# Patient Record
Sex: Male | Born: 1950 | Race: White | Hispanic: No | Marital: Married | Smoking: Former smoker
Health system: Southern US, Community
[De-identification: ages and names within clinical notes are randomized; demographics above are authoritative.]

## PROBLEM LIST (undated history)

## (undated) DIAGNOSIS — I2510 Atherosclerotic heart disease of native coronary artery without angina pectoris: Secondary | ICD-10-CM

## (undated) DIAGNOSIS — I10 Essential (primary) hypertension: Secondary | ICD-10-CM

## (undated) DIAGNOSIS — C329 Malignant neoplasm of larynx, unspecified: Secondary | ICD-10-CM

## (undated) DIAGNOSIS — R972 Elevated prostate specific antigen [PSA]: Secondary | ICD-10-CM

## (undated) DIAGNOSIS — F17201 Nicotine dependence, unspecified, in remission: Secondary | ICD-10-CM

## (undated) DIAGNOSIS — R05 Cough: Secondary | ICD-10-CM

## (undated) DIAGNOSIS — I679 Cerebrovascular disease, unspecified: Secondary | ICD-10-CM

## (undated) DIAGNOSIS — I724 Aneurysm of artery of lower extremity: Secondary | ICD-10-CM

## (undated) DIAGNOSIS — I82409 Acute embolism and thrombosis of unspecified deep veins of unspecified lower extremity: Secondary | ICD-10-CM

## (undated) DIAGNOSIS — I509 Heart failure, unspecified: Secondary | ICD-10-CM

## (undated) DIAGNOSIS — K219 Gastro-esophageal reflux disease without esophagitis: Secondary | ICD-10-CM

## (undated) DIAGNOSIS — I219 Acute myocardial infarction, unspecified: Secondary | ICD-10-CM

## (undated) DIAGNOSIS — K5792 Diverticulitis of intestine, part unspecified, without perforation or abscess without bleeding: Secondary | ICD-10-CM

## (undated) DIAGNOSIS — E119 Type 2 diabetes mellitus without complications: Secondary | ICD-10-CM

## (undated) DIAGNOSIS — I714 Abdominal aortic aneurysm, without rupture: Secondary | ICD-10-CM

## (undated) DIAGNOSIS — G4730 Sleep apnea, unspecified: Secondary | ICD-10-CM

## (undated) DIAGNOSIS — R053 Chronic cough: Secondary | ICD-10-CM

## (undated) DIAGNOSIS — E669 Obesity, unspecified: Secondary | ICD-10-CM

## (undated) HISTORY — DX: Essential (primary) hypertension: I10

## (undated) HISTORY — DX: Abdominal aortic aneurysm, without rupture: I71.4

## (undated) HISTORY — DX: Atherosclerotic heart disease of native coronary artery without angina pectoris: I25.10

## (undated) HISTORY — DX: Heart failure, unspecified: I50.9

## (undated) HISTORY — PX: CATARACT EXTRACTION: SUR2

## (undated) HISTORY — DX: Obesity, unspecified: E66.9

## (undated) HISTORY — DX: Aneurysm of artery of lower extremity: I72.4

## (undated) HISTORY — PX: EYE SURGERY: SHX253

## (undated) HISTORY — DX: Acute embolism and thrombosis of unspecified deep veins of unspecified lower extremity: I82.409

## (undated) HISTORY — DX: Nicotine dependence, unspecified, in remission: F17.201

## (undated) HISTORY — DX: Elevated prostate specific antigen (PSA): R97.20

## (undated) HISTORY — DX: Gastro-esophageal reflux disease without esophagitis: K21.9

## (undated) HISTORY — DX: Diverticulitis of intestine, part unspecified, without perforation or abscess without bleeding: K57.92

## (undated) HISTORY — DX: Cerebrovascular disease, unspecified: I67.9

## (undated) HISTORY — DX: Sleep apnea, unspecified: G47.30

## (undated) HISTORY — DX: Malignant neoplasm of larynx, unspecified: C32.9

---

## 1997-08-28 ENCOUNTER — Emergency Department (HOSPITAL_COMMUNITY): Admission: EM | Admit: 1997-08-28 | Discharge: 1997-08-28 | Payer: Self-pay | Admitting: Emergency Medicine

## 1997-11-26 ENCOUNTER — Inpatient Hospital Stay (HOSPITAL_COMMUNITY): Admission: EM | Admit: 1997-11-26 | Discharge: 1997-12-01 | Payer: Self-pay | Admitting: Cardiology

## 1998-04-28 HISTORY — PX: CARDIAC CATHETERIZATION: SHX172

## 1998-07-25 ENCOUNTER — Inpatient Hospital Stay (HOSPITAL_COMMUNITY): Admission: EM | Admit: 1998-07-25 | Discharge: 1998-07-26 | Payer: Self-pay | Admitting: Cardiology

## 2000-12-29 ENCOUNTER — Encounter: Payer: Self-pay | Admitting: Internal Medicine

## 2000-12-29 ENCOUNTER — Ambulatory Visit (HOSPITAL_COMMUNITY): Admission: RE | Admit: 2000-12-29 | Discharge: 2000-12-29 | Payer: Self-pay | Admitting: Internal Medicine

## 2001-06-01 ENCOUNTER — Ambulatory Visit: Admission: RE | Admit: 2001-06-01 | Discharge: 2001-06-01 | Payer: Self-pay | Admitting: Otolaryngology

## 2001-07-15 ENCOUNTER — Other Ambulatory Visit: Admission: RE | Admit: 2001-07-15 | Discharge: 2001-07-15 | Payer: Self-pay | Admitting: General Surgery

## 2001-09-29 ENCOUNTER — Ambulatory Visit (HOSPITAL_COMMUNITY): Admission: RE | Admit: 2001-09-29 | Discharge: 2001-09-29 | Payer: Self-pay | Admitting: Pulmonary Disease

## 2001-09-30 ENCOUNTER — Ambulatory Visit (HOSPITAL_COMMUNITY): Admission: RE | Admit: 2001-09-30 | Discharge: 2001-09-30 | Payer: Self-pay | Admitting: Pulmonary Disease

## 2002-01-04 ENCOUNTER — Other Ambulatory Visit: Admission: RE | Admit: 2002-01-04 | Discharge: 2002-01-04 | Payer: Self-pay | Admitting: General Surgery

## 2004-04-28 DIAGNOSIS — K5792 Diverticulitis of intestine, part unspecified, without perforation or abscess without bleeding: Secondary | ICD-10-CM

## 2004-04-28 HISTORY — DX: Diverticulitis of intestine, part unspecified, without perforation or abscess without bleeding: K57.92

## 2004-07-04 ENCOUNTER — Ambulatory Visit (HOSPITAL_COMMUNITY): Admission: RE | Admit: 2004-07-04 | Discharge: 2004-07-04 | Payer: Self-pay | Admitting: Pulmonary Disease

## 2004-11-28 ENCOUNTER — Emergency Department (HOSPITAL_COMMUNITY): Admission: EM | Admit: 2004-11-28 | Discharge: 2004-11-28 | Payer: Self-pay | Admitting: Emergency Medicine

## 2004-12-01 ENCOUNTER — Inpatient Hospital Stay (HOSPITAL_COMMUNITY): Admission: EM | Admit: 2004-12-01 | Discharge: 2004-12-07 | Payer: Self-pay | Admitting: Emergency Medicine

## 2004-12-02 ENCOUNTER — Ambulatory Visit: Payer: Self-pay | Admitting: Cardiology

## 2005-04-28 DIAGNOSIS — I714 Abdominal aortic aneurysm, without rupture, unspecified: Secondary | ICD-10-CM

## 2005-04-28 HISTORY — DX: Abdominal aortic aneurysm, without rupture, unspecified: I71.40

## 2005-04-28 HISTORY — PX: PERIPHERAL ARTERIAL STENT GRAFT: SHX2220

## 2005-04-28 HISTORY — DX: Abdominal aortic aneurysm, without rupture: I71.4

## 2006-01-21 ENCOUNTER — Ambulatory Visit (HOSPITAL_COMMUNITY): Admission: RE | Admit: 2006-01-21 | Discharge: 2006-01-21 | Payer: Self-pay | Admitting: Pulmonary Disease

## 2006-02-16 ENCOUNTER — Ambulatory Visit: Payer: Self-pay | Admitting: Cardiology

## 2006-02-27 ENCOUNTER — Encounter: Admission: RE | Admit: 2006-02-27 | Discharge: 2006-02-27 | Payer: Self-pay | Admitting: Vascular Surgery

## 2006-03-25 ENCOUNTER — Ambulatory Visit: Admission: RE | Admit: 2006-03-25 | Discharge: 2006-03-25 | Payer: Self-pay | Admitting: Vascular Surgery

## 2006-04-16 ENCOUNTER — Inpatient Hospital Stay (HOSPITAL_COMMUNITY): Admission: RE | Admit: 2006-04-16 | Discharge: 2006-04-19 | Payer: Self-pay | Admitting: Vascular Surgery

## 2006-04-16 ENCOUNTER — Encounter: Payer: Self-pay | Admitting: Vascular Surgery

## 2006-04-30 ENCOUNTER — Ambulatory Visit: Payer: Self-pay | Admitting: Cardiology

## 2006-08-27 ENCOUNTER — Ambulatory Visit: Payer: Self-pay | Admitting: Cardiology

## 2006-09-10 ENCOUNTER — Ambulatory Visit: Payer: Self-pay | Admitting: Cardiology

## 2006-09-11 ENCOUNTER — Ambulatory Visit: Payer: Self-pay | Admitting: Vascular Surgery

## 2006-09-11 ENCOUNTER — Encounter: Admission: RE | Admit: 2006-09-11 | Discharge: 2006-09-11 | Payer: Self-pay | Admitting: Vascular Surgery

## 2007-02-03 ENCOUNTER — Encounter: Admission: RE | Admit: 2007-02-03 | Discharge: 2007-02-03 | Payer: Self-pay | Admitting: Vascular Surgery

## 2007-02-03 ENCOUNTER — Ambulatory Visit: Payer: Self-pay | Admitting: Vascular Surgery

## 2007-04-29 HISTORY — PX: COLONOSCOPY WITH ESOPHAGOGASTRODUODENOSCOPY (EGD): SHX5779

## 2007-05-19 ENCOUNTER — Ambulatory Visit: Payer: Self-pay | Admitting: Vascular Surgery

## 2007-05-24 ENCOUNTER — Encounter: Admission: RE | Admit: 2007-05-24 | Discharge: 2007-05-24 | Payer: Self-pay | Admitting: Vascular Surgery

## 2007-06-08 ENCOUNTER — Ambulatory Visit (HOSPITAL_COMMUNITY): Admission: RE | Admit: 2007-06-08 | Discharge: 2007-06-08 | Payer: Self-pay | Admitting: Cardiology

## 2007-06-08 ENCOUNTER — Ambulatory Visit: Payer: Self-pay | Admitting: Cardiology

## 2007-07-07 ENCOUNTER — Ambulatory Visit: Payer: Self-pay | Admitting: Cardiology

## 2007-10-11 ENCOUNTER — Encounter (INDEPENDENT_AMBULATORY_CARE_PROVIDER_SITE_OTHER): Payer: Self-pay | Admitting: Pulmonary Disease

## 2007-10-11 ENCOUNTER — Ambulatory Visit (HOSPITAL_COMMUNITY): Admission: RE | Admit: 2007-10-11 | Discharge: 2007-10-11 | Payer: Self-pay | Admitting: Pulmonary Disease

## 2007-10-11 ENCOUNTER — Ambulatory Visit: Payer: Self-pay | Admitting: Cardiology

## 2007-10-18 ENCOUNTER — Ambulatory Visit (HOSPITAL_COMMUNITY): Admission: RE | Admit: 2007-10-18 | Discharge: 2007-10-18 | Payer: Self-pay | Admitting: Pulmonary Disease

## 2007-10-26 ENCOUNTER — Ambulatory Visit: Payer: Self-pay | Admitting: Gastroenterology

## 2007-10-27 ENCOUNTER — Ambulatory Visit: Payer: Self-pay | Admitting: Vascular Surgery

## 2007-10-27 HISTORY — PX: COLONOSCOPY: SHX174

## 2007-11-22 ENCOUNTER — Ambulatory Visit: Payer: Self-pay | Admitting: Internal Medicine

## 2007-11-22 ENCOUNTER — Ambulatory Visit (HOSPITAL_COMMUNITY): Admission: RE | Admit: 2007-11-22 | Discharge: 2007-11-22 | Payer: Self-pay | Admitting: Internal Medicine

## 2008-03-10 ENCOUNTER — Ambulatory Visit (HOSPITAL_COMMUNITY): Admission: RE | Admit: 2008-03-10 | Discharge: 2008-03-10 | Payer: Self-pay | Admitting: Surgery

## 2008-03-14 ENCOUNTER — Ambulatory Visit: Payer: Self-pay | Admitting: Cardiology

## 2008-04-28 DIAGNOSIS — C329 Malignant neoplasm of larynx, unspecified: Secondary | ICD-10-CM

## 2008-04-28 HISTORY — DX: Malignant neoplasm of larynx, unspecified: C32.9

## 2008-04-28 HISTORY — PX: LARYNX SURGERY: SHX692

## 2008-04-28 HISTORY — PX: LAPAROSCOPIC GASTRIC BANDING: SHX1100

## 2008-05-18 ENCOUNTER — Encounter (INDEPENDENT_AMBULATORY_CARE_PROVIDER_SITE_OTHER): Payer: Self-pay | Admitting: *Deleted

## 2008-05-18 LAB — CONVERTED CEMR LAB: HDL: 44 mg/dL

## 2008-05-24 ENCOUNTER — Encounter: Admission: RE | Admit: 2008-05-24 | Discharge: 2008-05-24 | Payer: Self-pay | Admitting: Vascular Surgery

## 2008-05-24 ENCOUNTER — Ambulatory Visit: Payer: Self-pay | Admitting: Vascular Surgery

## 2008-06-07 ENCOUNTER — Ambulatory Visit (HOSPITAL_COMMUNITY): Admission: RE | Admit: 2008-06-07 | Discharge: 2008-06-07 | Payer: Self-pay | Admitting: Surgery

## 2008-06-19 ENCOUNTER — Ambulatory Visit (HOSPITAL_COMMUNITY): Admission: RE | Admit: 2008-06-19 | Discharge: 2008-06-19 | Payer: Self-pay | Admitting: Surgery

## 2008-08-02 ENCOUNTER — Encounter: Admission: RE | Admit: 2008-08-02 | Discharge: 2008-08-02 | Payer: Self-pay | Admitting: Surgery

## 2008-09-06 ENCOUNTER — Encounter (INDEPENDENT_AMBULATORY_CARE_PROVIDER_SITE_OTHER): Payer: Self-pay | Admitting: *Deleted

## 2008-09-15 ENCOUNTER — Encounter (INDEPENDENT_AMBULATORY_CARE_PROVIDER_SITE_OTHER): Payer: Self-pay | Admitting: *Deleted

## 2008-09-21 ENCOUNTER — Ambulatory Visit: Payer: Self-pay | Admitting: Cardiology

## 2008-09-21 ENCOUNTER — Encounter: Payer: Self-pay | Admitting: Cardiology

## 2008-11-30 ENCOUNTER — Encounter: Admission: RE | Admit: 2008-11-30 | Discharge: 2009-02-28 | Payer: Self-pay | Admitting: Surgery

## 2008-12-13 ENCOUNTER — Other Ambulatory Visit: Payer: Self-pay | Admitting: Surgery

## 2008-12-13 ENCOUNTER — Encounter: Payer: Self-pay | Admitting: Cardiology

## 2008-12-15 ENCOUNTER — Ambulatory Visit: Payer: Self-pay | Admitting: Cardiology

## 2008-12-19 ENCOUNTER — Inpatient Hospital Stay (HOSPITAL_COMMUNITY): Admission: RE | Admit: 2008-12-19 | Discharge: 2008-12-20 | Payer: Self-pay | Admitting: Surgery

## 2009-01-16 ENCOUNTER — Encounter: Payer: Self-pay | Admitting: Cardiology

## 2009-03-02 ENCOUNTER — Encounter: Payer: Self-pay | Admitting: Cardiology

## 2009-04-05 ENCOUNTER — Telehealth (INDEPENDENT_AMBULATORY_CARE_PROVIDER_SITE_OTHER): Payer: Self-pay | Admitting: *Deleted

## 2009-04-28 HISTORY — PX: OPEN ANTERIOR SHOULDER RECONSTRUCTION: SHX2100

## 2009-05-11 ENCOUNTER — Ambulatory Visit: Admission: RE | Admit: 2009-05-11 | Discharge: 2009-07-13 | Payer: Self-pay | Admitting: Radiation Oncology

## 2009-05-30 ENCOUNTER — Encounter: Admission: RE | Admit: 2009-05-30 | Discharge: 2009-05-30 | Payer: Self-pay | Admitting: Surgery

## 2009-09-11 ENCOUNTER — Encounter (INDEPENDENT_AMBULATORY_CARE_PROVIDER_SITE_OTHER): Payer: Self-pay | Admitting: *Deleted

## 2009-10-11 ENCOUNTER — Encounter: Payer: Self-pay | Admitting: Cardiology

## 2009-11-20 ENCOUNTER — Encounter (INDEPENDENT_AMBULATORY_CARE_PROVIDER_SITE_OTHER): Payer: Self-pay | Admitting: *Deleted

## 2009-11-21 ENCOUNTER — Encounter (INDEPENDENT_AMBULATORY_CARE_PROVIDER_SITE_OTHER): Payer: Self-pay | Admitting: *Deleted

## 2009-11-21 ENCOUNTER — Ambulatory Visit: Payer: Self-pay | Admitting: Cardiology

## 2009-11-21 DIAGNOSIS — I10 Essential (primary) hypertension: Secondary | ICD-10-CM | POA: Insufficient documentation

## 2010-03-11 ENCOUNTER — Encounter (INDEPENDENT_AMBULATORY_CARE_PROVIDER_SITE_OTHER): Payer: Self-pay | Admitting: *Deleted

## 2010-03-19 ENCOUNTER — Ambulatory Visit: Payer: Self-pay | Admitting: Cardiology

## 2010-03-19 ENCOUNTER — Encounter (INDEPENDENT_AMBULATORY_CARE_PROVIDER_SITE_OTHER): Payer: Self-pay | Admitting: *Deleted

## 2010-03-22 ENCOUNTER — Encounter (INDEPENDENT_AMBULATORY_CARE_PROVIDER_SITE_OTHER): Payer: Self-pay | Admitting: *Deleted

## 2010-03-22 ENCOUNTER — Encounter: Payer: Self-pay | Admitting: Cardiology

## 2010-03-22 LAB — CONVERTED CEMR LAB
AST: 23 units/L (ref 0–37)
BUN: 14 mg/dL (ref 6–23)
Basophils Relative: 1 % (ref 0–1)
Calcium: 9.2 mg/dL (ref 8.4–10.5)
Chloride: 101 meq/L (ref 96–112)
Cholesterol: 187 mg/dL (ref 0–200)
Creatinine, Ser: 1.02 mg/dL (ref 0.40–1.50)
Eosinophils Relative: 3 % (ref 0–5)
HCT: 51.5 % (ref 39.0–52.0)
HDL: 35 mg/dL — ABNORMAL LOW (ref >39)
Hemoglobin: 17.2 g/dL — ABNORMAL HIGH (ref 13.0–17.0)
MCHC: 33.4 g/dL (ref 30.0–36.0)
MCV: 95 fL (ref 78.0–100.0)
Monocytes Absolute: 0.7 10*3/uL (ref 0.1–1.0)
Monocytes Relative: 11 % (ref 3–12)
Neutro Abs: 3.6 10*3/uL (ref 1.7–7.7)
RBC: 5.42 M/uL (ref 4.22–5.81)
Total Bilirubin: 0.4 mg/dL (ref 0.3–1.2)
Total CHOL/HDL Ratio: 5.3

## 2010-03-25 ENCOUNTER — Encounter: Payer: Self-pay | Admitting: Cardiology

## 2010-04-01 ENCOUNTER — Telehealth (INDEPENDENT_AMBULATORY_CARE_PROVIDER_SITE_OTHER): Payer: Self-pay

## 2010-04-03 ENCOUNTER — Telehealth (INDEPENDENT_AMBULATORY_CARE_PROVIDER_SITE_OTHER): Payer: Self-pay

## 2010-04-03 ENCOUNTER — Encounter: Payer: Self-pay | Admitting: Adult Health

## 2010-04-23 ENCOUNTER — Encounter: Payer: Self-pay | Admitting: Cardiology

## 2010-05-19 ENCOUNTER — Encounter: Payer: Self-pay | Admitting: Surgery

## 2010-05-19 ENCOUNTER — Encounter: Payer: Self-pay | Admitting: Vascular Surgery

## 2010-05-22 ENCOUNTER — Telehealth (INDEPENDENT_AMBULATORY_CARE_PROVIDER_SITE_OTHER): Payer: Self-pay | Admitting: *Deleted

## 2010-05-28 NOTE — Letter (Signed)
Summary: [place] [facility], [facility] at [employer]  [address] [place] [address]   Phone: [phone]  Fax: [phone]     March 19, 2010 MRN: [id number]   Mathew Padilla [address] [place], [place]  [address]      YOUR LAB WORK IS DUE   March 19, 2010  Please go to [facility], located across the street from [facility] on the second floor.  Hours are Monday - Friday 7am until 7:30pm         Saturday 8am until 12noon    __  DO NOT EAT OR DRINK AFTER MIDNIGHT EVENING PRIOR TO LABWORK

## 2010-05-28 NOTE — Miscellaneous (Signed)
Summary: LABS LIPIDS,05/18/2008  Clinical Lists Changes  Observations: Added new observation of LDL: 70 mg/dL (38/75/6433 29:51) Added new observation of HDL: 44 mg/dL (88/41/6606 30:16) Added new observation of TRIGLYC TOT: 234 mg/dL (05/06/3233 57:32) Added new observation of CHOLESTEROL: 161 mg/dL (20/25/4270 62:37)

## 2010-05-28 NOTE — Letter (Signed)
Summary: [place] [facility], [facility] at [employer]  [address], [place] [address]   Phone: [phone]  Fax: [phone]     April 03, 2010 MRN: [id number]   Mathew Padilla [address] [place], [place]  [address]      YOUR LAB WORK IS DUE  April 22, 2010 _________________________________________  Please go to [facility], located across the street from [facility] on the second floor.  Hours are Monday - Friday 7am until 7:30pm         Saturday 8am until 12noon    __  DO NOT EAT OR DRINK AFTER MIDNIGHT EVENING PRIOR TO LABWORK  _X_ YOUR LABWORK IS NOT FASTING --YOU MAY EAT PRIOR TO LABWORK

## 2010-05-28 NOTE — Miscellaneous (Signed)
  Clinical Lists Changes  Orders: Added new Referral order of Treadmill (Treadmill) - Signed  I spoke with pt, GXT scheduled for 03/19/10 at 11:30am per TG.   Teressa Lower RN  March 12, 2010 11:51 AM pt asked to hold his diovan per protocol

## 2010-05-28 NOTE — Letter (Signed)
Summary: [facility]   Imported By: Faythe Ghee 03/22/2010 13:25:16  _____________________________________________________________________  External Attachment:    Type:   Image     Comment:   External Document

## 2010-05-28 NOTE — Letter (Signed)
Summary: Appointment - Reminder 2  [place] [facility] [place]. [address], [place] [address]   Phone: [phone]  Fax: [phone]     Sep 11, 2009 MRN: [id number]   Mathew Padilla [address] [place], [place]  [address]   Dear Mr. Gilpatrick,  Our records indicate that it is time to schedule a follow-up appointment.  Dr.  Dietrich Pates        recommended that you follow up with [place] in     05/2009       . It is very important that we reach you to schedule this appointment. We look forward to participating in your health care needs. Please contact us at the number listed above at your earliest convenience to schedule your appointment.  If you are unable to make an appointment at this time, give [place] a call so we can update our records.     Sincerely,   Glass blower/designer

## 2010-05-28 NOTE — Progress Notes (Signed)
Summary: labwork added  Phone Note Call from Patient   Caller: Patient Reason for Call: Talk to Nurse Summary of Call: patient wants to know if he can have HGA1C can be added to his labwork for 12/26 / tg Initial call taken by: Raechel Ache [facility],  April 03, 2010 10:29 AM  Follow-up for Phone Call        Lab orders mailed to pt. Follow-up by: Larita Fife Via LPN,  April 03, 2010 2:55 PM

## 2010-05-28 NOTE — Letter (Signed)
Summary: [place] [facility], agricultural at [employer]  [address] [place] [address]   Phone: [phone]  Fax: [phone]     November 21, 2009 MRN: [id number]   Mathew Padilla [address] [place], [place]  [address]      YOUR LAB WORK IS DUE   MONDAY   November 26, 2009  Please go to [facility], located across the street from [facility] on the second floor.  Hours are Monday - Friday 7am until 7:30pm         Saturday 8am until 12noon    _X_  DO NOT EAT OR DRINK AFTER MIDNIGHT EVENING PRIOR TO LABWORK  __ YOUR LABWORK IS NOT FASTING --YOU MAY EAT PRIOR TO LABWORK
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
## 2010-05-28 NOTE — Letter (Signed)
Summary: [place] [facility], [facility] at [employer]  [address] [place] [address]   Phone: [phone]  Fax: [phone]     March 22, 2010 MRN: [id number]   Mathew Padilla [address] [place], [place]  [address]      YOUR LAB WORK IS DUE   April 22, 2010  Please go to [facility], located across the street from [facility] on the second floor.  Hours are Monday - Friday 7am until 7:30pm         Saturday 8am until 12noon    _x_  DO NOT EAT OR DRINK AFTER MIDNIGHT EVENING PRIOR TO LABWORK

## 2010-05-28 NOTE — Progress Notes (Signed)
Summary: PT HAVING TOOTH PULLED  Phone Note Call from Patient Call back at Home Phone [phone]   Caller: PT Reason for Call: Talk to Nurse Summary of Call: PT IS HAVING A TOOTH PULLED AND WOULD LIKE TO KNOW IF HE NEEDS TO BE ON ANY MEDICATIONS PRIOR TO HAVING THIS DONE Initial call taken by: Faythe Ghee,  April 01, 2010 3:14 PM  Follow-up for Phone Call        [facility]. Follow-up by: Larita Fife Via LPN,  April 02, 2010 1:29 PM  Additional Follow-up for Phone Call Additional follow up Details #1::        Pt's wife states that in the past pt. has been treated with PCN before any dental procedures were done due to pt's heart and kidney stents. Pt. is scheduled for teeth extraction on 12-28 and they want to know if he will need to be medicated this time. Please advise.  Additional Follow-up by: Larita Fife Via LPN,  April 02, 2010 4:06 PM    No need to hold any medications for tooth extraction.  Joni Reining NP  Does pt. need PCN before teeth extractions?       Larita Fife Via LPN  April 03, 2010 1:32 PM   No, he doesn't have stents or mechanical valve.  Joni Reining NP  Pt's wife advised.        Larita Fife Via LPN  April 04, 2010 12:14 PM

## 2010-05-28 NOTE — Letter (Signed)
Summary: progress note 09-28-09  progress note 09-28-09   Imported By: Faythe Ghee 10/11/2009 12:27:43  _____________________________________________________________________  External Attachment:    Type:   Image     Comment:   External Document

## 2010-05-30 NOTE — Progress Notes (Signed)
Summary: lab results  Phone Note Call from Patient Call back at Home Phone [phone]   Caller: pt Reason for Call: Talk to Nurse, Lab or Test Results Summary of Call: test results Initial call taken by: Faythe Ghee,  May 22, 2010 12:56 PM  Follow-up for Phone Call        results given to pt, verbalized understanding Follow-up by: Teressa Lower RN,  May 22, 2010 1:18 PM

## 2010-07-09 ENCOUNTER — Other Ambulatory Visit: Payer: Self-pay | Admitting: Vascular Surgery

## 2010-07-09 DIAGNOSIS — I714 Abdominal aortic aneurysm, without rupture: Secondary | ICD-10-CM

## 2010-07-25 ENCOUNTER — Ambulatory Visit (INDEPENDENT_AMBULATORY_CARE_PROVIDER_SITE_OTHER): Payer: Medicare Other | Admitting: Vascular Surgery

## 2010-07-25 ENCOUNTER — Ambulatory Visit
Admission: RE | Admit: 2010-07-25 | Discharge: 2010-07-25 | Disposition: A | Discharge status: Home / Self Care | Payer: Medicare Other | Source: Ambulatory Visit | Attending: Vascular Surgery | Admitting: Vascular Surgery

## 2010-07-25 DIAGNOSIS — I714 Abdominal aortic aneurysm, without rupture: Secondary | ICD-10-CM

## 2010-07-25 MED ORDER — IOHEXOL 350 MG/ML SOLN
150.0000 mL | Freq: Once | INTRAVENOUS | Status: AC | PRN
  Administered 2010-07-25: 150 mL via INTRAVENOUS

## 2010-07-26 NOTE — Assessment & Plan Note (Signed)
OFFICE VISIT  Mathew, Padilla DOB:  [birth year]                                       07/25/2010 CHART#:[id number]  The patient returns for followup today.  He previously had a Zenith aneurysm stent graft placed in December of 2007.  He has had no problems since then.  He returns today for further followup.  He denies any abdominal or back pain.  He has been able to lose some weight with a laparotomy band procedure and [facility]  CHRONIC MEDICAL PROBLEMS:  Remain obesity, reflux disease and hypertension.  These are currently controlled and followed by Dr. Juanetta Gosling.  Of note, he was treated for throat cancer and a polyp on his vocal cord in 2011 and has recovered from this.  PHYSICAL EXAMINATION:  Blood pressure 121/85 in the left arm, heart rate 96 and regular, respirations 20.  Neck:  Has no carotid bruits. Abdomen:  Obese, soft, nontender, nondistended.  No pulsatile mass. Femoral pulses are difficult to palpate secondary to obesity.  Feet are pink, warm and well-perfused with no palpable pedal pulses.  He does have a few scattered varicosities.  Skin:  Intact.  No ulcers or rashes.  He had a CT scan of the abdomen and pelvis today which shows the stent graft is in good position with the top portion of the stent just below the celiac origin.  There is no evidence of endo leak.  The native aortic diameter is 3.7 cm in diameter down from 5.3 cm preoperatively.  In summary, the patient is doing well after endovascular stent grafting. He will follow up in 1 year's time for an abdominal aortic ultrasound for further followup of his aneurysm and then yearly thereafter.    Janetta Hora. Analysse Quinonez, MD Electronically Signed  CEF/MEDQ  D:  07/25/2010  T:  07/26/2010  Job:  [id number]  cc:   Ramon Dredge L. Juanetta Gosling, M.D.

## 2010-08-03 LAB — DIFFERENTIAL
Basophils Absolute: 0 10*3/uL (ref 0.0–0.1)
Basophils Absolute: 0 10*3/uL (ref 0.0–0.1)
Eosinophils Relative: 1 % (ref 0–5)
Lymphocytes Relative: 17 % (ref 12–46)
Lymphocytes Relative: 37 % (ref 12–46)
Monocytes Absolute: 0.7 10*3/uL (ref 0.1–1.0)
Monocytes Absolute: 0.9 10*3/uL (ref 0.1–1.0)
Monocytes Relative: 11 % (ref 3–12)
Neutro Abs: 4 10*3/uL (ref 1.7–7.7)

## 2010-08-03 LAB — CBC
HCT: 46.7 % (ref 39.0–52.0)
HCT: 48 % (ref 39.0–52.0)
Hemoglobin: 15.9 g/dL (ref 13.0–17.0)
MCHC: 33.9 g/dL (ref 30.0–36.0)
MCV: 94.5 fL (ref 78.0–100.0)
Platelets: 186 10*3/uL (ref 150–400)
RDW: 13.4 % (ref 11.5–15.5)
RDW: 13.6 % (ref 11.5–15.5)

## 2010-08-03 LAB — COMPREHENSIVE METABOLIC PANEL
Albumin: 4.1 g/dL (ref 3.5–5.2)
BUN: 23 mg/dL (ref 6–23)
Calcium: 9.5 mg/dL (ref 8.4–10.5)
Creatinine, Ser: 1.08 mg/dL (ref 0.4–1.5)
Total Protein: 7.7 g/dL (ref 6.0–8.3)

## 2010-09-10 NOTE — Op Note (Signed)
NAME:  KANEN, MOTTOLA NO.:  [id number]   MEDICAL RECORD NO.:  [id number]          PATIENT TYPE:  INP   LOCATION:  1235                         FACILITY[facility]  [facility]   PHYSICIAN:  Sandria Bales. Ezzard Standing, M.D.  DATE OF BIRTH:  1950/05/17   DATE OF PROCEDURE:  12/19/2008  DATE OF DISCHARGE:                               OPERATIVE REPORT   Date of Surgery ?   PREOPERATIVE DIAGNOSIS:  Morbid obesity (weight 246, BMI 51.3), hiatal  hernia.   POSTOPERATIVE DIAGNOSIS:  Morbid obesity (weight 246, BMI 51.3), hiatal  hernia, thickened left vocal cord.   PROCEDURE:  Laparoscopic band placement with APL band (subcutaneous  port), hiatal hernia repair with 2 + 2 sutures.   SURGEON:  Sandria Bales. Ezzard Standing, M.D.   FIRST ASSISTANT:  Sharlet Salina T. Hoxworth, M.D.   ANESTHESIA:  General endotracheal anesthesia   ESTIMATED BLOOD LOSS:  Minimal.   INDICATIONS FOR PROCEDURE:  Mr. Salek is a 60 year-old white male who is  morbidly obese and has been through our preoperative bariatric program  and now comes for attempted laparoscopic band placement.  He is a  patient of Dr. Juanetta Gosling, who is his primary care physician. He has seen  Dr. Dietrich Pates from a cardiac standpoint and Dr. Sandrea Hughs from a  pulmonary standpoint.  He is on CPAP for sleep apnea.   On his preoperative evaluation, he was found to have a small hiatal  hernia and I discussed with him at the time of surgery a possible repair  of the hiatel hernia..   The potential complications of lap band placement and hiatal hernia  repair include, but not limited to, bleeding, infection, bowel injury,  slippage or erosion of the band and long term nutritional complications.   OPERATIVE NOTE:  The patient placed in supine position and given a  general endotracheal anesthetic.  This was supervised by Dr. Rica Mast who  reviewed the findings at the time of laryngoscopy and wondered whether  he had abnormal cords.  The patient has had some  hoarseness which has  been attributed to the lisinopril he is taking.  I discussed this with  the family about him maybe seeing an ENT doctor when he has gotten over  the operation just for further evaluation.   A time out was had identifying the patient and the procedure and the  surgeon's checklist run.  His abdomen which had been prepped with THC  and was sterilely draped.  I gained access to the abdominal cavity  through the left upper quadrant and got into the abdominal cavity with  an 11 mm Ethicon trocar.  I placed 5 additional trocars; a 5 mm  subxiphoid trocar, a 15 mm right subcostal trocar, a 12 mm right  paramedian trocar, a 12 mm left paramedian trocar and a 5 mm left  lateral trocar.   Interestingly, his right and left lobe of the liver are showing some  fatty infiltration.  We did see some spots of fat on his small bowel but  he had at least two areas of fairly dense anterior abdominal wall  adhesions.  One was between his umbilicus and his falciform ligament in  his mid upper abdomen.  These were sort of filmy adhesions. I took about  half these down using my trocars in place.  I could not see any obvious  hernia and did not know if a hernia was part of this or not.  I did not  see one.  There were also some adhesions in his left upper quadrant  lateral to the stomach.  He has had no prior abdominal surgery.  The  cause of these adhesions was somewhat unclear and there was nothing  associated to biopsy with the adhesions.   I then turned my attention to his upper abdomen.  I placed in a  Nathanson  liver retractor and retracted the left lobe of the liver  anteriorly and he had a lot of fat around his hiatus.  I dissected out.  First I went along the left side of the esophagus and dissected out the  angle of His.  I then went along the right side of the esophagus and got  behind the gastrohepatic ligament and identified the right crus.  We did  the balloon test where  Anesthesia passed the sizing balloon, put 50 mL  of air and then pulled this up but clearly he had had a patchy hiatus  that this could be passed and pulled back without difficulty.  I then  dissected out along the right crus in the retroesophageal area and  around anteriorly and identified what was a fairly generous esophageal  area.  I found the left and right crus.  I put two posterior sutures of  0 Ethibond, closed with tie knots.   Then I passed the sizing balloon and blew up with 15 mL of air; now it  hung with the hiatus so I felt the hiatus had been closed adequately  without making it too tight.   I then passed the finger dissector behind the gastroesophageal junction  and passed an APL band around the proximal  stomach  right beyond the  gastroesophageal junction.  I actually had dissected out enough fat to  make an area where the band could be cinched down and it was cinched  down without difficulty over the sizing tube and then the sizing tube  removed.  I then placed 3 imbricating sutures along the greater  curvature of the stomach over the band to the proximal gastric remnants  using gastric plicating sutures.  There was no bleeding at any suture  site.  All the sutures looked good.  I then removed the tubing from the  band out through the paramedian incision.  I reinspected where the liver  retractor was.  I reinspected where the adhesions were.  I left the  reminder of these alone.  I then desufflated the  abdomen and removed  the trocars with no bleeding at any trocar site.  I did decide to  develop a subcutaneous port for reservoir for him.  I created a 2  fingerbreadth pocket.  I attached the reservoir to the Silastic tubing.  This had a backing of polypropylene and tucked this in laterally.   I then closed the subcutaneous tissues over the reservoir with 2-0  Vicryl sutures.  I infiltrated about 2 mL of 0.25% Marcaine closing the  skin at each site with a 5-0  Monocryl suture, used Dermabond on the  wounds.   The patient tolerated the procedure well.  I did reveal  the hiatal  hernia.  He had a lot of fat around his gastroesophageal junction.  I  defatted some. The port placement looked good and of note, he does have  thickened vocal cords I will speak to his wife about.   Sponge and needle count were correct at the end of the case.  He was  transported to the recovery room in good condition.      Sandria Bales. Ezzard Standing, M.D.  Electronically Signed     DHN/MEDQ  D:  12/19/2008  T:  12/19/2008  Job:  [id number]   cc:   Ramon Dredge L. Juanetta Gosling, M.D.  Fax: [phone]   Gerrit Friends. Dietrich Pates, MD, [facility]  [address]  [place], [place] [address]   Charlaine Dalton. Sherene Sires, MD, FCCP  [address]  [place] [address]   Janetta Hora. Fields, MD  [address], [place] [address]

## 2010-09-10 NOTE — Assessment & Plan Note (Signed)
OFFICE VISIT   JANCE, SIEK  DOB:  April 18, 1951                                       02/03/2007  CHART#:[id number]   The patient returns for followup after placement of his zenith aneurysm  stent graft in December 2007.  He has had no problems with abdominal or  back pain.  Otherwise, he has been well.   PHYSICAL EXAMINATION:  Today, blood pressure is 125/83, heart rate 73  and regular.  Abdomen soft, nontender, nondistended, with 1+ femoral  pulses.  Incisions are well healed.   A CT scan of the abdomen and pelvis was reviewed.  His aneurysm has  shrunk from 5.1 cm down to 3.8 cm.  There is no evidence of endoleak.  Overall, the patient is doing well.  He will have a followup scan in 3  months' time and then we will go to yearly CT scans after that.   Janetta Hora. Fields, MD  Electronically Signed   CEF/MEDQ  D:  02/03/2007  T:  02/04/2007  Job:  [id number]   cc:   Ramon Dredge L. Juanetta Gosling, M.D.

## 2010-09-10 NOTE — Procedures (Signed)
[facility]                              EXERCISE TREADMILL   RAFE, MACKOWSKI                      MRN:          [id number]  DATE:09/10/2006                            DOB:          1950-10-20    PROCEDURE:  Graded exercise test.   1. Upright treadmill exercise performed to a workload of 7 METs and a      heart rate of 131, 79% of patient's age-predicted maximum.      Exercise discontinued due to dyspnea and fatigue; no chest pain      reported.  2. Blood pressure increased from a resting value of 120/70 to 190/80      at peak exercise, a normal response.  3. No arrhythmias noted.  4. 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   Stress EKG:  No significant change; occasional PVC.   IMPRESSION:  Slightly submaximal graded exercise test revealing adequate  exercise tolerance, a normal blood pressure response, no significant  arrhythmias, and no electrocardiographic evidence for myocardial  ischemia.  Other findings as noted.     Gerrit Friends. Dietrich Pates, MD, [facility]     RMR/MedQ  DD: 09/10/2006  DT: 09/10/2006  Job #: [id number]

## 2010-09-10 NOTE — H&P (Signed)
NAME:  ERBY, Mathew Padilla NO.:  [id number]   MEDICAL RECORD NO.:  [id number]          PATIENT TYPE:  AMB   LOCATION:  DAY                           FACILITY:  [facility]   PHYSICIAN:  Kassie Mends, M.D.      DATE OF BIRTH:  08-09-1950   DATE OF ADMISSION:  DATE OF DISCHARGE:  LH                              HISTORY & PHYSICAL   CHIEF COMPLAINT:  Abdominal bloating, change in bowel movements, bad  breath, and weight gain.   HISTORY OF PRESENT ILLNESS:  Mr. Hanawalt is a 60 year old morbidly obese  Caucasian gentleman who presents today as a self-referral for the above-  stated symptoms.  He complains of chronic postprandial upper abdominal  discomfort and bloating.  He states he feels like he is stuffed even  when he just drinks a little water.  He has had no nausea or vomiting.  Denies any heartburn.  Currently on ranitidine 150 mg twice a day.  Denies any dysphagia or odynophagia.  He states his abdomen gets very  tight after he eats his meal.  He has a change in bowel movements with  mostly diarrhea but sometimes constipation.  His stools are dark at  times.  Denies any bright red blood per rectum.  He complains of  fatigue.  He has shortness of breath at times even at rest.  He  complains of sweating a lot.  He says he has been on multiple diet over  the years and always seems to gain weight instead of loose weight.  At  one point, he did go to the steps for gastric bypass but this was put on  hold because of abdominal aortic aneurysm that needed to be repaired.  He has since had a stent/graft placed in his aorta back in December  2007. His wife states he has very bad breath.  He can smell across the  room.  She is concerned if he has an infection.  She says it is similar  to when he was in the hospital with a ruptured diverticulum which was  treated with antibiotic therapy.  Surgery was contemplated but they  state because of his aneurysm that they wanted to try to treat  medically  first and apparently this was successful.   CURRENT MEDICATIONS:  1. Ranitidine 150 mg b.i.d.  2. Colchicine 0.6 mg b.i.d.  3. Lisinopril 40 mg daily.  4. Torsemide 40 mg daily.  5. Metanx daily.  6. CPAP.   ALLERGIES:  CHOLESTEROL MEDICATIONS including ZOCOR and some type of  unknown drug which is part of a study that caused a rash.   PAST MEDICAL HISTORY:  1. Hiatal hernia.  2. Hypertension.  3. Sleep apnea.  4. History of prior MI status post angioplasty back in 1999.  5. He had a stent placed.  6. He reports having a stress test a couple of years ago which was      unremarkable.  7. He has gallstones.  8. History of enlarged prostate.  9. History of diverticulitis with perforation as outlined above.  10.He had an EGD in  2000, which showed multiple small distal      esophageal erosions, bulbar duodenal erosions.  H. pylori      serologies were negative.  11.He has never had a colonoscopy.   FAMILY HISTORY:  Mother had lung disease, died at age 18.  Father died  at age 58 with MI.  He had an aunt with colorectal cancer.   SOCIAL HISTORY:  He is married.  He has 3 children.  He is unemployed.  He is a nonsmoker.  No alcohol use.   REVIEW OF SYSTEMS:  GI:  See HPI.  CONSTITUTIONAL:  He complains of progressive weight loss.  He states he  weighed 360 pounds at Dr. Marvel Plan office a couple of weeks ago.  CARDIOPULMONARY:  Complains of fatigue, shortness of breath, dyspnea on  exertion, or diaphoresis.  Denies chest pain.  Denies cough or  palpitations.  GENITOURINARY: Complains of urinary hesitancy and difficulty with the  stream.  No dysuria or hematuria.   PHYSICAL EXAM:  VITAL SIGNS:  Unable to obtain his weight here.  He  states he weighed 360 pounds recently, 5 feet 11 height, temperature  97.9, blood pressure 110/78 with a large cuff, pulse 76.  GENERAL:  Pleasant, obese, Caucasian male in no acute distress.  He  appears quite comfortable.  SKIN:   Warm and dry.  No jaundice.  HEENT:  Sclerae nonicteric. Oropharyngeal mucosa moist and pink.  No  lesions, erythema or exudate.  No lymphadenopathy or  thyromegaly.  CHEST:  Lungs clear auscultation.  CARDIAC:  Exam reveals regular rate and rhythm.  Normal S1 and S2, no  murmurs, rubs, or gallops.  ABDOMEN: Positive bowel sounds.  Abdomen is very obese, soft, nontender.  Do not appreciate any organomegaly or masses due to body habitus.  No  obvious hernias.  No abdominal bruits.  EXTREMITIES:  Lower Extremities:  1+ pitting edema bilaterally   IMPRESSION:  Mr. Eckels is a morbidly obese Caucasian gentleman who  presents today for further evaluation of early satiety, abdominal  bloating with change in bowel movements with mostly diarrhea, but  sometimes constipation.  He complains of ongoing weight gain and  malodorous breath.  Denies any typical heartburn or abdominal pain.  He  has a history of perforated diverticulitis a couple of years ago.  He  has never had a colonoscopy.  Suggested at this time, we will pursue  colonoscopy for change in bowel movements.  We will also perform upper  endoscopy to further evaluate early satiety, exclude peptic ulcer  disease, etc.  He has numerous GI complaints.  I have asked him to  follow up with Dr. Juanetta Gosling including problems with his urination, most  likely due to his prostatic enlargement which has been seen on multiple  CTs.  He inquires quite a bit about possible methods of weight loss and  I offered a nutritional referral to a dietician at the hospital.  He is  interested in pursuing some sort of evaluation at a weight loss center.   PLAN:  1. Colonoscopy and EGD with Dr. Jena Gauss.  2. Follow up with Dr. Juanetta Gosling regarding prostatic enlargement.  3. Retrieve copy of recent labs done through Dr. Juanetta Gosling office.  4. Arrange for referral to weight loss center after appropriate workup      has been done here.  5. Further recommendations to  follow.      Tana Coast, P.A.      Kassie Mends, M.D.  Electronically Signed  LL/MEDQ  D:  10/26/2007  T:  10/27/2007  Job:  [id number]

## 2010-09-10 NOTE — Op Note (Signed)
NAME:  Mathew Padilla, Mathew Padilla NO.:  [id number]   MEDICAL RECORD NO.:  [id number]          PATIENT TYPE:  AMB   LOCATION:  DAY                           FACILITY:  [facility]   PHYSICIAN:  R. Roetta Sessions, M.D. DATE OF BIRTH:  [birth year]   DATE OF PROCEDURE:  11/22/2007  DATE OF DISCHARGE:                               OPERATIVE REPORT   Diagnostic EGD followed by a screening colonoscopy.   INDICATIONS FOR PROCEDURE:  Morbidly obese 60 year old gentleman with  early satiety, abdominal bloating, halitosis, and progressive weight  gain.  He is having typical reflux symptoms.  He is on ranitidine 150 mg  orally once daily for reflux symptoms.  He also has alternating  constipation and diarrhea, reported history of diverticulitis  previously, he has never had his lower GI tract imaged luminally.  No  family history of colon cancer in any first degree relatives, one aunt  did have the disease.  EGD and colonoscopy are now being done.  This  approach has been discussed with the patient at length.  Potential  risks, benefits, and alternatives have been reviewed.  Questions  answered.  He is agreeable.  Please see documentation in medical record.   PROCEDURE NOTE:  O2 saturation, blood pressure, pulse, and respirations  are monitored throughout the entire procedure.  Conscious sedation,  Versed 6 mg IV and Demerol 100 mg IV in divided doses prior to  procedure.  Cetacaine spray for topical pharyngeal anesthesia.   INSTRUMENTATION:  Pentax video chip system.   FINDINGS:  EGD examination of the tubular esophagus revealed four  quadrant distal esophageal erosions patulous EG junction.  Erosions were  not very big, there were no more than 5 mm in dimensions.  There is no  Barrett esophagus, EG junction easily traversed and into his stomach.  Gastric cavity was emptied and insufflated well with air.  Thorough  examination of the gastric mucosa including retroflexion of the proximal  stomach, esophagogastric junction demonstrated only a small hiatal  hernia and a couple of tiny antral erosions.  Pylorus was patent and  easily traversed.  Examination of the bulb and second portion revealed  couple of bulbar erosions.  There was a 7 mm somewhat yellowish  submucosal lesion on the angularis consistent with a lipoma not  manipulated.   THERAPEUTIC/DIAGNOSTIC MANEUVERS PERFORMED:  None.   The patient tolerated the procedure well.  The patient prepared for  colonoscopy.  Digital rectal exam revealed no abnormalities.  Endoscopic  findings:  Prep was adequate.  Colon:  Colonic mucosa was surveyed from  the rectosigmoid junction through the left transverse, right colon,  appendiceal orifice, ileocecal valve, and cecum.  These structures were  all seen and photographed for the record.  From this level, the scope  was slowly and cautiously withdrawn.  All previously mentioned mucosal  surfaces were again seen.  The patient had scattered left-sided  diverticula, a long tortuous but otherwise normal appearing colon.  The  scope was pulled down the rectum with examination of rectal mucosa,  including retroflexed view of the anal verge demonstrated no  abnormalities.  The patient tolerated the procedure well and was reacted  in endoscopy.   IMPRESSION:  1. Distal esophageal erosions, patulous gastroesophageal junction.      There was erosive reflux esophagitis.  2. Hiatal hernia, lipoma on the angularis not manipulated, otherwise      unremarkable gastric mucosa aside from antral erosions, patent      pylorus, bulbar erosions, otherwise D1 and D2 appeared normal.  3. Colonoscopy findings:  Normal rectum, tortuous colon, left-sided      diverticula.  The remainder of the colon mucosa appeared normal.   I suspect the patient has untreated gastroesophageal reflux disease and  irritable bowel syndrome as the diagnosis of his current GI symptoms.   RECOMMENDATIONS:  1. Stop  ranitidine and begin Aciphex 20 mg orally daily, prescription      given.  2. Begin Alinia, a probiotic 1 capsule daily.  3. Diverticulosis and reflux disease literature provided to [name].  4. Followup appointment with [place] in 6 weeks.      Jonathon Bellows, M.D.  Electronically Signed     RMR/MEDQ  D:  11/22/2007  T:  11/23/2007  Job:  [id number]

## 2010-09-10 NOTE — Assessment & Plan Note (Signed)
OFFICE VISIT   WALLACE, GAPPA  DOB:  03/01/1951                                       05/24/2008  CHART#:[id number]   Mathew Padilla returns for followup today.  He previously underwent placement  of a Zenith aneurysm stent graft on April 16, 2006.  He has done well  since then.  He denies any abdominal or back pain.  He is still  undergoing evaluation for lap banding for morbid obesity.  He thinks  that possibly in March of this year he may undergo the procedure.   PHYSICAL EXAMINATION:  VITAL SIGNS:  Blood pressure is 116/71 the left  arm, pulse 81 and regular, temperature is 97.9.  HEENT:  Unremarkable.  NECK:  Has 2+ carotid pulses without bruit.  CHEST:  Clear to auscultation.  CARDIAC:  Regular rate rhythm without murmur.  ABDOMEN:  Obese, soft, nontender with no pulsatile mass.  He has 2+  femoral pulses bilaterally.   CT angiogram from today was reviewed.  His abdominal aortic aneurysm has  shrunk from 5.1 cm down to 3.9 cm.  Top of the stent was 5 mm below the  superior mesenteric artery which is similar location to previously.   Overall, Mathew Padilla is doing well.  He will follow up again in 1 year for  repeat CT angio.  He will continue to manage his atherosclerotic risk  factors and also continue his aspirin.  He was also given a renewal of  prescription today for compression stockings for his lower extremities  for leg swelling.   Janetta Hora. Fields, MD  Electronically Signed   CEF/MEDQ  D:  05/25/2008  T:  05/25/2008  Job:  [id number]

## 2010-09-10 NOTE — Assessment & Plan Note (Signed)
OFFICE VISIT   Mathew Padilla, Mathew Padilla  DOB:  Jun 09, 1950                                       05/19/2007  CHART#:[id number]   The patient returns for followup today after stent graft repair of his  abdominal aortic aneurysm in December of 2007.  He reports that he has  had no abdominal or back pain since that time.  His last CT scan was in  October of 2008, which showed significant decrease in size of his  aneurysm from 5.1 to 3.8 cm.   His only recent development is he said over the past few weeks he has  had some erectile dysfunction.  He states that he has taken Viagra with  no symptomatic relief of this.  He has not had previous episodes of  erectile dysfunction.  Of note, he is also in a weight loss program, and  is also considering gastric bypass surgery.  He denies prior history of  diabetes.   PHYSICAL EXAM:  Blood pressure is 159/95, pulse 87 and regular.  Abdomen  is obese, soft, nontender, nondistended with no pulsatile mass.  He has  1+ femoral pulses bilaterally with well-healed groin scars.  He has  absent popliteal and pedal pulses, but his legs are fairly obese, and  this is difficult to palpate.   The patient was scheduled to have a CT angiogram to reevaluate his stent  graft today.  However, for some reason, the scan did not get scheduled.  We will schedule this for him within the next week.  I will review his  CT scan at that time.  If there is no evidence of endoleak or any  problems with the stent, he will follow up again in 3 months' time.  I  have also scheduled him for an evaluation at [facility] for  evaluation of his erectile dysfunction.   Janetta Hora. Fields, MD  Electronically Signed   CEF/MEDQ  D:  05/20/2007  T:  05/20/2007  Job:  [id number]

## 2010-09-10 NOTE — Letter (Signed)
Sep 21, 2008    Edward L. Juanetta Gosling, MD  [address]  [place], [place] [address]   RE:  Mathew Padilla, Mathew Padilla  MRN:  [id number]  /  DOB:  1951/04/18   Dear Renae Fickle,   Mathew Padilla returns to the office for continued assessment and treatment  of coronary artery disease and cardiovascular risk factors.  Since his  last visit, he has continued to work through the prerequisites for lap  band surgery.  He expects that the procedure will proceed within the  next few months.  He has had significant symptoms related to peripheral  neuropathy that did not respond to Neurontin, but have improved  dramatically with Cymbalta.  He has had some dry mouth and some  constipation as well as profuse diaphoresis.  He also notes dyspnea on  mild-to-moderate exertion.  He has had no chest discomfort.  Blood  pressure control has been good.  He has been advised to stop aspirin and  Niaspan in anticipation of surgery.  His other medications were  unchanged.   PHYSICAL EXAMINATION:  GENERAL:  Pleasant overweight gentleman.  VITAL SIGNS:  The weight is 359, 1 pound more than 6 months ago.  Blood  pressure 140/75, heart rate 90 and regular, respirations 14 and  unlabored.  NECK:  No jugular venous distention.  LUNGS:  Clear.  SKIN:  Moist along the back.  CARDIAC:  Normal first and second heart sounds.  ABDOMEN:  Obese; otherwise benign.  EXTREMITIES:  Trace edema.   LABORATORY DATA:  Lab studies were performed in January.  Lipid profile  was fairly good with total cholesterol of 161, triglycerides of 234, HDL  44, and LDL of 70.   IMPRESSION:  Mathew Padilla is doing generally well.  Hypertension and  hyperlipidemia are adequately managed.  He has not had signs or symptoms  of coronary artery disease since 2-vessel angioplasty in March 2000.  He  will resume aspirin and Niaspan following his surgery and return to see  me in 9 months.    Sincerely,      Gerrit Friends. Dietrich Pates, MD, [facility]  Electronically Signed    RMR/MedQ  DD: 09/21/2008  DT: 09/22/2008  Job #: [id number]

## 2010-09-10 NOTE — Letter (Signed)
June 08, 2007    Ramon Dredge L. Juanetta Gosling, M.D.  [address]  [place], [place] [address]   RE:  OMRI, BERTRAN  MRN:  [id number]  /  DOB:  1951-03-30   Dear Renae Fickle,   Mr. Asato returns to the office for continued assessment and treatment  of coronary disease and cardiovascular risk factors.  Since his last  visit, he has done generally well.  He has developed rhinorrhea,  postnasal drip, and cough with sputum production over the past few weeks  to month or two.  He is currently taking a course of antibiotics, but  has not improved much.  It is unclear whether he notes some dyspnea with  this.  He has had no chest discomfort.   He is considering taking a job in [place] that would cause him to be out of  country most of the time.  He wonders if this is advisable from a  medical standpoint.   CURRENT MEDICATIONS:  1. Lisinopril 40 mg daily.  2. Ranitidine 150 mg b.i.d.  3. Torsemide.  4. KCl 10 mEq daily.  5. Aspirin 325 mg every other day.   PHYSICAL EXAMINATION:  GENERAL:  Currently, on exam, overweight pleasant  gentleman.  VITAL SIGNS:  The weight is 354, 32 pounds more than in May 2008.  Blood  pressure 155/95, heart rate 100 and regular, respirations 18.  NECK:  No jugular venous distention; no carotid bruits.  LUNGS:  Clear.  VOICE:  Raspy.  CARDIAC:  Normal first and second heart sounds; fourth heart sound  present.  ABDOMEN:  Soft and nontender; no organomegaly.  EXTREMITIES:  Trace edema; distal pulses intact.   EKG:  Sinus tachycardia; prior inferior myocardial infarction; slightly  delayed R-wave progression.   IMPRESSION:  Mr. Weidler is doing well overall.  We will get him a chest x-  ray due to chronic cough and sputum production.  I suggested he schedule  an appointment with you for followup.   Blood pressure control is suboptimal.  We will add metoprolol 25 mg  b.i.d..  He will return in 1 month for a check with the cardiology  nurses.   He is on no statin.  He  apparently had an adverse reaction to Crestor in  the past.  We will start pravastatin at a dose of 20 mg daily, and check  a lipid profile in 1 month.  I will see this nice gentleman again in 6  months.   We discussed the weight reduction surgery.  He is in touch with the  [facility] and will continue to follow up with them.  I told him he is not an optimal candidate for travel to [place] and should  be sure that he can be MedEvac-ed out should he develop a cardiac  problem.    Sincerely,      Gerrit Friends. Dietrich Pates, MD, [facility]  Electronically Signed    RMR/MedQ  DD: 06/08/2007  DT: 06/10/2007  Job #: [id number]

## 2010-09-10 NOTE — Letter (Signed)
Aug 27, 2006    Edward L. Juanetta Gosling, M.D.  [address]  [place], [place] [address]   RE:  Padilla, Mathew  MRN:  [id number]  /  DOB:  1950/08/26   Dear Ed:   Mathew Padilla returns to the office at his request.  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  Symptomatically he has done great.  He has lost weight and  initiated an exercise routine with an improved sense of well-being and  improved exercise capacity.  He has had no chest pain or dyspnea.  He  has chronic edema that has been manageable.  He has recovered well from  his abdominal aortic aneurysm stent graft and is scheduled for a CT scan  of the abdomen as a routine procedure in the near future.  He has not  had any clinical problems since restenosis of a circumflex angioplasty  site in March of 2000 that required redilatation.   CURRENT MEDICATIONS:  1. Lisinopril 40 mg daily.  2. Ranitidine 150 mg b.i.d.  3. Torsemide 20 mg daily.  4. Kay-Ciel 10 mEq daily.  5. Aspirin 325 mg t.i.w.   EXAMINATION:  Very pleasant well-appearing gentleman who is overweight  and in no acute distress.  The weight is 322, 19 pounds less than  January 08.  Blood pressure 120/70, heart rate 70 and regular,  respirations 16.  NECK:  No jugular venous distention; no carotid bruits.  LUNGS:  Clear.  CARDIAC:  Normal first and second heart sounds; fourth heart sound  present.  ABDOMEN:  Soft and nontender; no organomegaly.  EXTREMITIES:  Healed incision over the right femoral artery; 1+ distal  edema.   IMPRESSION:  Mathew Padilla is doing well from a symptomatic standpoint.  We  will start with a standard exercise test.  If the electrocardiographic  response is abnormal, a followup test with imaging may be required.  Control of hyperlipidemia was excellent in December and need not be  rechecked.  We will obtain a chemistry profile due to his use of  diuretics and angiotensin converting  enzyme inhibitors.  We discussed a  non-pharmacologic approach to his edema to include strict salt  restriction and leg elevation as possible.    Sincerely,      Gerrit Friends. Dietrich Pates, MD, [facility]  Electronically Signed    RMR/MedQ  DD: 08/27/2006  DT: 08/27/2006  Job #: [id number]

## 2010-09-10 NOTE — Assessment & Plan Note (Signed)
OFFICE VISIT   Mathew Padilla, Mathew Padilla  DOB:  08-22-1950                                       10/27/2007  CHART#:[id number]   The patient is a 59 year old male who previously underwent Zenith  aneurysm stent graft repair of an abdominal aortic aneurysm in December  of 2007.  He has done well since then.  He was last seen in January of  2009.  A CT scan at that time showed no evidence of endoleak and good  stent graft position.  He presents today with complaints of lower  extremity swelling.  He states that his feet primarily swell up.  He  states that this is worst at the end of the day.  His feet are normal  size in the morning.  He has pain and swelling which becomes worse with  sitting.  He was recently seen by his primary care doctor who prescribed  colchicine and calcium supplement for him and suspected this may be from  gout.  He denies any claudication symptoms.  He has no rest pain.  He  describes also a pins and needles sensation in his feet.  He also has  decreased sensation on the bottoms of his feet when walking.  He denies  any prior history of DVT.   PHYSICAL EXAMINATION:  Vital signs:  On physical exam blood pressure is  130/80 in the left arm, pulse is 72 and regular.  Chest:  Is clear to  auscultation.  Cardiac:  Is regular rate and rhythm without murmur.  Abdomen:  Is obese, soft, nontender, nondistended.  Lower extremities:  He has 1+ femoral, 1+ popliteal and 2+ posterior tibial pulses  bilaterally.  Groin incisions are well-healed.  He has no significant  edema on exam today.  He has no varicosities in his lower extremities.   I believe that the patient's feet swelling is most likely  multifactorial.  He does have a history of mild diabetes and could be  experiencing some neuropathy.  He also may have some venous incompetence  secondary to his obesity.  I do not believe he has any arterial  component to his lower extremity symptoms.   I  believe the best option for him for the swelling symptoms would be  graded compression stockings and I have prescribed these for him today.  I again also counseled him as far as weight loss reduction.  He is  trying to do this with diet and is also considering a lap band  procedure.  If he has continued symptoms long term we would consider a  venous duplex exam to see if he has incompetent saphenofemoral junction.  However, if he gets relief with lower extremity compression stockings  alone I think this should be sufficient.  He will follow up with me in  January of 2010 to again have CT angiogram of his abdomen and pelvis to  reassess his stent graft.   Mathew Hora. Fields, MD  Electronically Signed   CEF/MEDQ  D:  10/27/2007  T:  10/28/2007  Job:  [id number]   cc:   Mathew Padilla, M.D.

## 2010-09-10 NOTE — Letter (Signed)
March 14, 2008    Mathew Padilla, M.D.  [address]  [place], [place] [address]   RE:  JEURY, MCNAB  MRN:  [id number]  /  DOB:  [birth year]   Dear Renae Fickle,   Mr. Creque returns to the office, requesting clearance for bariatric  surgery.  A laparoscopic banding procedure is anticipated.  As you know,  he has done well since suffering a myocardial infarction in 1999,  treated with percutaneous intervention.  At the present time, he has no  chest discomfort and mild chronic exertional dyspnea.  It has been  challenging maintaining a medical regime for him, since he has side  effects to multiple agents.  He was most recently started on  pravastatin, but stopped this.  He does not recall the reason.  He was  also started on metoprolol and also stopped that.  He reports good blood  pressures of late.  His lipid profile was never terrible, but he would  benefit from additional LDL lowering.   Other notable issues in his history are sleep apnea, requiring CPAP and  an abdominal aortic aneurysm that was repaired with stent grafting in  2007.   CURRENT MEDICATIONS:  1. Lisinopril 40 mg daily.  2. Torsemide 20 mg daily.  3. KCl 10 mEq daily.  4. Aspirin 325 mg t.i.w.  5. Gabapentin 200 mg t.i.d.   PHYSICAL EXAMINATION:  GENERAL:  Overweight pleasant gentleman.  VITAL SIGNS:  The weight is 358, 4 pounds more than in February 2009.  Blood pressure 120/80, heart rate 89 and regular, and respirations 14.  NECK:  No jugular venous distention; no carotid bruits.  LUNGS:  Clear.  CARDIAC:  Distant first and second heart sounds.  ABDOMEN:  Obese; otherwise benign.  EXTREMITIES:  Trace edema.   EKG:  Normal sinus rhythm; indeterminate axis; delayed R-wave  progression; minor lateral T-wave abnormalities.  Comparison with prior  tracing of June 08, 2007:  Inferior myocardial infarction is no  longer apparent.   IMPRESSION:  Mr. Ramcharan is doing well overall.  Hypertension is  adequately  controlled.  He has no symptoms to suggest recurrent  myocardial ischemia.  His last lipid profile shows an LDL of 97, HDL of  49, and triglycerides of 186.  We will start Niaspan and an attempt to  titrate to 1500 mg.  Blood pressure control appears adequate - He will  monitor that.  I asked him to call my office if he experiences adverse  drug effects.  I will see him again in 6 months.  I believe that he  would benefit from Bariatric Surgery and that the risk-benefit ratio is  favorable.    Sincerely,      Gerrit Friends. Dietrich Pates, MD, [facility]  Electronically Signed    RMR/MedQ  DD: 03/14/2008  DT: 03/15/2008  Job #: [id number]   CC:    [facility]

## 2010-09-13 NOTE — H&P (Signed)
NAME:  Mathew Padilla, Mathew Padilla NO.:  [id number]   MEDICAL RECORD NO.:  [id number]          PATIENT TYPE:  EMS   LOCATION:  ED                            FACILITY:  [facility]   PHYSICIAN:  Angus G. Renard Matter, MD   DATE OF BIRTH:  [birth year]   DATE OF ADMISSION:  12/01/2004  DATE OF DISCHARGE:  LH                                HISTORY & PHYSICAL   A 60 year old white male who came in to the emergency department and was  seen by Ed physician, chief complaint being lower abdominal pain which had  begun three days prior to this admission.  He had some nausea and episode of  diarrhea as well.  Pain has been moderately severe, intermittent, and does  not seem to be relieved by anything that he has done.  The patient has a  prior history of coronary artery disease, GERD, and previous MI.  CT of the  abdomen does show evidence of diverticulitis and possible perforation with  inflammation of small bowel. Dr. Rosalia Hammers discussed this with Dr. 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  He was started on Cipro and  Zosyn in the emergency department and subsequently admitted.   SOCIAL HISTORY:  The patient was a former cigarette smoker, does not use  alcohol.   FAMILY HISTORY:  Noncontributory.   PAST MEDICAL AND SURGICAL HISTORY:  1.  The patient has history of having had MRI some 5 years ago.  He is being      followed by [facility] for coronary artery disease.  2.  History of GERD.   MEDICATIONS:  1.  Ranitidine hydrochloride 150 mg b.i.d.  2.  Lisinopril 40 mg daily.  3.  Torsemide 20 mg daily.   ALLERGIES:  No known allergies.   REVIEW OF SYSTEMS:  HEENT:  Negative.  CARDIOPULMONARY:  No chest pain or  dyspnea.  GI:  The patient has had moderately severe, at time severe pain in  left lower quadrant and episodes of diarrhea and nausea with no vomiting.  GU: No dysuria or hematuria.   PHYSICAL EXAMINATION:  GENERAL: Uncomfortable white male.  VITAL SIGNS:  Blood  pressure 130/69, pulse 121, respirations 20.  HEENT:  Eyes PERRLA.  TMs negative.  Oropharynx benign.  NECK:  No JVD or thyroid abnormalities.  HEART:  Regular rhythm, no murmurs.  ABDOMEN:  The patient is markedly tender over the mid and lower abdomen with  rebound tenderness. Tenderness present in the right lower quadrant.  EXTREMITIES: Free of edema.  NEUROLOGIC:  No focal deficit.   PERTINENT LABORATORY STUDIES:  CBC: WBC 14,500, hemoglobin 16.3, hematocrit  49.4, 80% neutrophils, 6% lymphocytes.  Chemistries: Sodium 133, potassium  3.7, chloride 102, CO2 19, glucose  1.  BUN 15, creatinine 1.3, calcium 8.9.   DIAGNOSES:  1.  Abdominal pain.  2.  Acute diverticulitis with possible perforation of diverticulum.  3.  History of coronary artery disease.  4.  Previous myocardial infarction.       AGM/MEDQ  D:  12/01/2004  T:  12/01/2004  Job:  [id number] 

## 2010-09-13 NOTE — Discharge Summary (Signed)
NAME:  Mathew Padilla, Mathew Padilla NO.:  [id number]   MEDICAL RECORD NO.:  [id number]          PATIENT TYPE:  INP   LOCATION:  A325                          FACILITY:  [facility]   PHYSICIAN:  Edward L. Juanetta Gosling, M.D.DATE OF BIRTH:  [birth year]   DATE OF ADMISSION:  12/01/2004  DATE OF DISCHARGE:  08/12/2006LH                                 DISCHARGE SUMMARY   HISTORY:  This is a 60 year old who was brought in because of abdominal  pain.  He had been seen about 3 days previously and had been thought to have  some sort of a heat-related illness at that time.  Since then however he has  been having pain in the mid abdomen to lower abdomen, it has been severe but  intermittent, he rates it about an 8/10.  His CT of the abdomen that was  done in the emergency room showed evidence of diverticulitis and possible  perforation.   PHYSICAL EXAMINATION:  His physical exam shows a well-developed, well-  nourished obese male who is in no acute distress.  Blood pressure 130/69,  pulse 120, respirations 20.  His abdomen was tender in the mid and lower  abdomen with rebound, some tenderness in the right lower quadrant, he had no  blood in his stool; his chest was clear; his heart was regular;  his abdomen  was soft.   LABORATORIES:  White count was 14,500, hemoglobin 16.3, sodium 133, chloride  102, CO2 of 19, BUN 15, creatinine 1.3.   HOSPITAL COURSE:  He was started on intravenous antibiotics and showed  marked improvement over the next several days.  He had a repeat CT because  he still had some abdominal discomfort and tenderness which showed  improvement and no further evidence of a leak.  He is discharged home in  much improved condition on Cipro 500 mg b.i.d., Flagyl 500 mg t.i.d. - both  for 10 more days.  He is going to continue with his other treatments as  before.  Additionally, while he was here, he was discovered to have an  aortic aneurysm which had been noted before but he had  not followed up.  The  aneurysm had been described as being smaller but now is listed at 5.1 cm.  He was evaluated by the [facility] Team who felt that he needed to  follow up after he is out of the hospital.  He had consultation with Dr.  Lovell Sheehan who felt that this could be treated medically hopefully without  surgery considering his multiple medical problems.  The [facility] Cardiology  team was going to get him back into their followup routine because he had  not been seen in their office in many years.   FINAL DISCHARGE DIAGNOSES:  1.  Diverticulitis with small perforation of a diverticulum.  2.  Abdominal aortic aneurysm 5.1 cm.  3.  Coronary artery occlusive disease.  4.  Sleep apnea.  5.  Obesity.  6.  Gastroesophageal reflux disease.  7.  Depression.  8.  Hyponatremia.   DISCHARGE MEDICATIONS:  Discharge medications other than the Cipro  and  Flagyl include Zantac 150 mg b.i.d., Lexapro 40 mg daily, Demadex 20 mg  daily, and potassium chloride 10 mEq daily.       ELH/MEDQ  D:  12/07/2004  T:  12/07/2004  Job:  [id number]

## 2010-09-13 NOTE — Letter (Signed)
February 16, 2006    Janetta Hora. Darrick Penna, MD  [address], [place] [address]   RE:  NELDON, SHEPARD  MRN:  [id number]  /  DOB:  12/09/1950   Dear Leonette Most:   It was my pleasure reassessing Mr. Vivier in consultation today in the office  at your request.  This nice gentleman had been lost to followup, we last saw  him in 2005.  As you know, he suffered an inferior myocardial infarction in  1999, requiring percutaneous intervention in the circumflex.  He returned in  2000, with recurrent symptoms and required two vessel intervention in the  posterior descending coronary and in a second marginal branch of the  circumflex.  He has done very well since then with essentially no  cardiopulmonary symptoms.  Additional problems include hypertension,  obesity, sleep apnea and GERD.  We have previous lipid profiles that were  generally good, but it is unclear to me whether or not he was receiving  pharmacologic therapy at that time.  He has been off treatment for some time  due to possible adverse reactions to a number of Statins.  He has recently  been advised to resume Rosuvastatin.   The patient's abdominal aortic aneurysm was discovered some time ago and was  rediscovered when he was admitted for diverticular disease in August 2006.  I have a CT report from September 2007, indicating a maximal diameter of 5.3  cm, increased from 4.8 cm on the previous exam.   Past medical and social history, review of systems and family history were  updated.  There are no other pertinent additions.   CURRENT MEDICATIONS:  1. KCl 10 mEq daily.  2. Aspirin 325 mg daily.  3. Lisinopril 40 mg daily.  4. Etodolac 500 mg b.i.d.  5. Ranitidine 150 mg daily.  6. Torsemide 20 mg daily.   PHYSICAL EXAMINATION:  GENERAL:  Overweight, pleasant gentleman in no acute  distress.  VITAL SIGNS:  Weight 344 pounds, height 5 feet 10 inches.  Body mass index  is 49.  Blood pressure 150/80, heart rate 85 and  regular, respirations 16.  NECK:  No jugular venous distention.  Normal carotid upstrokes without  bruits.  LUNGS:  Clear.  CARDIAC:  Normal S1, S2.  S4 present.  ABDOMEN:  Obese, otherwise benign.  EXTREMITIES:  No edema.  Distal pulses intact.  SKIN:  No significant lesions.  ENDOCRINE:  No thyromegaly.   LABORATORY DATA AND X-RAY FINDINGS:  EKG with normal sinus rhythm,  borderline first-degree AV block, prior inferior myocardial infarction.  Comparison with prior tracing of January 25, 2002, showed inferior Q waves  are more prominent, voltage has increased slightly.   IMPRESSION:  Mr. Salais is doing well from a symptomatic standpoint.  He has  had normal left ventricular systolic function in the past.  He reportedly  had stress testing at the [place] a year ago, apparently without significant  abnormalities.  In this setting, with relatively low risk surgery planned, I  believe that no further assessment is warranted.  We will add a beta  blocker, both to increase safety in the perioperative interval, as well as  to reduce his blood pressure further.  He will restart statin with plans to  find some drug that he can continue to take.  We will be happy to be  available as needed in the hospital should he experience problems with his  stent graft with which we can assist.   Thanks  so much for sending him back to me.  Will attempt to maintain  continuous followup and to optimally treat his cardiovascular risk factors.  He has also asked whether surgical intervention for obesity is warranted.  He certainly meets criteria, and a procedure can be considered in the  future.    Sincerely,      Gerrit Friends. Dietrich Pates, MD, [facility]    RMR/MedQ  DD: 02/16/2006  DT: 02/17/2006  Job #: [id number]   CC:    Ramon Dredge L. Juanetta Gosling, M.D.

## 2010-09-13 NOTE — H&P (Signed)
NAME:  Mathew Padilla, Mathew Padilla NO.:  [id number]   MEDICAL RECORD NO.:  [id number]          PATIENT TYPE:  INP   LOCATION:  NA                           FACILITY:  [facility]   PHYSICIAN:  Janetta Hora. Fields, MD  DATE OF BIRTH:  May 04, 1950   DATE OF ADMISSION:  DATE OF DISCHARGE:                              HISTORY & PHYSICAL   PRIMARY DOCTOR:  Dr. Juanetta Gosling.   CARDIOLOGIST:  Dr. Dietrich Pates.   CHIEF COMPLAINT:  Abdominal aortic aneurysm, probably asymptomatic.   HISTORY AND PHYSICAL:  This is a 60 year old Caucasian male with a past  medical history of coronary artery disease and sleep apnea.  The patient  recently had an episode of diverticulitis with treatment of antibiotics  and made a full recovery.  When the patient had diverticulitis, he did  have a CT scan which was abnormal.  On CT scan on December 06, 2005, the  patient was noted to have an abdominal aortic aneurysm, 5.1 cm.  The  patient had a follow CAT scan on January 21, 2006, which showed  resolution of the diverticulitis but expansion of the aneurysm to 5.3 x  4.9 in diameter.  The aneurysm in infrarenal in character.  Patient also  noted to have prostate enlargement.  The patient was then referred to  Dr. Darrick Penna who saw the patient in the office on February 13, 2006.   Due to the patient's age, it was thought that the aneurysm was going to  continue to expand, and there was some risk of rupture.  The patient  does have multiple comorbidities such as morbid obesity, previous  history for diverticulitis, and pulmonary problems.  It was best thought  the patient undergo aortic stent grafting if he is a candidate.  The  patient was then scheduled to evaluate this with a CT angiogram on  February 27, 2006.  The patient did undergo a CT angiogram by Dr. Darrick Penna  which showed the aortic neck was 26 mm, and the common iliac arteries  had minimal __________  with no evidence of aneurysm.  The patient's  aneurysm is  amenable to stent graft repair, and this is scheduled for  April 01, 2006.  The patient has remained asymptomatic and still  remains asymptomatic.  He denies any abdominal pain, nausea, vomiting,  constipation, __________, back pain, claudication, peripheral edema,  dysuria, hematuria, reflux symptoms, angina, palpitations, history of  arrhythmias and TIA symptoms.  Patient also denies any back pain.   PAST MEDICAL HISTORY:  1. Coronary artery disease.  2. History of MI in 1999, status post PCI and angioplasty.  3. Hypertension.  4. Hyperlipidemia.  5. Sleep apnea, on CPAP.  6. History of diverticulitis.  7. Obesity.   SURGICAL HISTORY:  Removal of pilonidal cyst.   ALLERGIES:  NO KNOWN DRUG ALLERGIES.   MEDICATIONS:  Include:  1. Zantac 100 mg p.o. b.i.d.  2. Potassium chloride 10 mEq p.o. daily.  3. Lisinopril 40 mg p.o. daily.  4. Torsemide 20 mg p.o. b.i.d.  5. Zocor 40 mg p.o. daily.  6. Metoprolol 50 mg p.o. daily.  REVIEW OF SYSTEMS:  See HPI for current positives and negatives,  otherwise, negative for chronic obstructive pulmonary disease, asthma,  renal insufficiency, CVA/TIA, syncope, diabetes mellitus, and vision  changes.   SOCIAL HISTORY:  The patient is married and has 3 children.  He lives  with his family.  The patient denies any regular alcohol use.  He did  quit smoking tobacco in 1999.  The patient is currently on disability.  [place]   FAMILY HISTORY:  The patient does have a positive family history of  coronary artery disease in his father.  Denies any family history of  abdominal aortic aneurysms, cerebrovascular accidents, and diabetes  mellitus.   PHYSICAL EXAMINATION:  Vitals show blood pressure 122/70, heart rate 78,  respirations 16.  GENERAL:  This is 60 year old Caucasian male in no acute distress.  HEENT:  Normocephalic,  atraumatic.  Pupils equal, round, reactive to  light and accommodation.   Extraocular movements intact.  Oral mucosa  pink, moist.  Sclerae anicteric.  Patient did not have dentures.  NECK:  Supple.  Palpable carotids bilaterally.  No carotid bruits heard  on auscultation.  RESPIRATORY:  Symmetrical inspiration, unlabored.  Clear to auscultation  bilaterally.  CARDIAC:  Regular rate and rhythm.  No murmur, gallop, or rub.  ABDOMEN:  Soft, nontender, nondistended.  Normoactive bowel sounds x4.  Patient does have an obese stomach, and I am unable to palpate a  pulsatile mass.  GU/RECTAL:  Deferred.  EXTREMITIES:  The patient does have edema bilaterally.  Patient's lower  extremities are warm.  He has 2+radial, femoral, popliteal, dorsalis  pedis and posterior tibial pulses bilaterally.  NEUROLOGIC:  Nonfocal.  Alert and oriented x4.  Gait is steady.  Muscle  strength  5+ bilaterally and throughout.  Deep tendon reflexes 2+ and  symmetrical.   ASSESSMENT:  Asymptomatic infrarenal abdominal aortic aneurysm.   PLAN:  Will admit the patient to [facility] on April 01, 2006, under Dr. Evelina Dun service.  1. The patient will undergo endovascular stent graft repair of his      abdominal aortic aneurysm.  2. The risks and benefits are explained to the patient in great      detail.  Dr. Darrick Penna has seen and evaluated the patient prior to      admission and is in agreement with the above.      Constance Holster, PA      Janetta Hora. Fields, MD  Electronically Signed    JMW/MEDQ  D:  03/25/2006  T:  03/26/2006  Job:  [id number]   cc:   Dr. Anthonette Legato. Dietrich Pates, MD, [facility]

## 2010-09-13 NOTE — Discharge Summary (Signed)
NAME:  Mathew Padilla, HUR NO.:  [id number]   MEDICAL RECORD NO.:  [id number]          PATIENT TYPE:  INP   LOCATION:  2033                         FACILITY:  [facility]   PHYSICIAN:  Janetta Hora. Fields, MD  DATE OF BIRTH:  [birth year]   DATE OF ADMISSION:  04/16/2006  DATE OF DISCHARGE:  04/18/2006                               DISCHARGE SUMMARY   ADMISSION DIAGNOSIS:  A 5.3-cm infrarenal abdominal aortic aneurysm.   DISCHARGE/SECONDARY DIAGNOSES:  1. A 5.3-cm infrarenal abdominal aortic aneurysm status post stent      graft repair.  2. Postoperative urinary retention requiring reinsertion of Foley      catheter.  3. Enlarged prostate per CT angiogram with known history of      prostatitis.  4. Postoperative mild hypokalemia and hyponatremia.  5. History of coronary artery disease status post myocardial      infarction in 1999 status percutaneous coronary intervention and      angioplasty.  6. Hypertension.  7. Hyperlipidemia.  8. Obstructive sleep apnea, in need of CPAP.  9. History of diverticulitis.  10.Obesity.  11.History of removal of pilonidal cyst.  12.No known drug allergies although developed nausea with oxycodone      this hospitalization.   PROCEDURES:  1. April 16, 2006, insertion of venous aneurysm  2. Insertion of venous aneurysm stent graft by Dr. Fabienne Bruns,      main body right side, 30 x 82 graft, left limb 20 x 71, right limb      20 x 54.   BRIEF HISTORY:  [name] is a 60 year old Caucasian male with a past  medical history of coronary artery disease and sleep apnea.  He recently  had an episode of diverticulitis with treatment of antibiotics and made  a full recovery.  During his workup, a CT scan was done on December 06, 2005 noting an abdominal aortic aneurysm measuring 5.1 cm.  The patient  had a followup CT scan on January 21, 2006 which showed resolution of  the diverticulitis but expansion of the aneurysm to 5.3 x 4.9 cm  in  diameter.  The aneurysm was infrarenal.  Prostate enlargement was also  noted.  He was referred to vascular surgeon, Dr. Fabienne Bruns, and  seen in consultation on February 13, 2006.  Because of the patient's size  and comorbidities and aneurysm characteristics, it was felt that aortic  stent grafting was the best treatment option.  A CT angiogram done on  February 27, 2006 confirmed he was a good candidate.  After discussing  risks, benefits, and alternative, the patient agreed to proceed.   HOSPITAL COURSE:  Mr. Radel was electively admitted to [facility] on April 16, 2006 and underwent stent graft repair of his  abdominal aortic aneurysm.  Postoperatively, he was extubated,  neurologically intact, and transferred to stepdown in 3300 overnight.   Postoperative day number one, he remained stable although with some  hypertension.  Postprocedure AVI's were stable showing greater than 1.0  bilaterally (preliminary report).  Foley catheter was discontinued as  well as arterial line,  and he was transferred to telemetry unit 2000  where it is anticipated he will remain until discharge.   On postoperative day two, again, he remained stable but with complaints  of urinary frequency and with signs of retention.  Ultimately, a Foley  catheter was placed, and approximately 1700 mL of urine was obtained.  A  specimen was sent for a urinalysis and culture and sensitivity;  although, results are still pending at the time of this dictation.  Morning labs also showed some hyponatremia and hypokalemia at 124 and  3.4, respectively.  His potassium was supplemented, and followup  laboratories have been ordered for the following morning but are still  pending at the time of this dictation.   He did undergo a postprocedure CT angiogram of his abdomen and pelvis  showing stable AAA status post endoluminal stent graft repair with no  evidence of complications.  There was evidence of  cholelithiasis, and a  distended bladder was noted with enlarged prostate.  Of note, this is  prior to his Foley catheter insertion.  Due to his urinary frequency, he  had also reported problems with sleep, and Ambien p.r.n. was ordered.  Otherwise, he was mobilizing without significant difficulty with the use  of rolling walker.   Respiratory therapy was consulted and to assist with his CPAP set up at  nighttime.  He was tolerating a regular diet and was without abdominal  pain.  His pain was controlled on Tramadol.  Oxycodone was discontinued  secondary to nausea.  His vitals have ranged anywhere from systolic  blood pressure around 130 to 150 and diastolic around 70 to mid-90's.  He has been afebrile.  Heart rate is in sinus rhythm in the 90s, and  saturation is 96% on room air.   His most recent laboratories from December 22 show a sodium of 124,  potassium 3.4, chloride 91, CO2 26, blood glucose 139, BUN 10,  creatinine 1.1, calcium 7.9.  His urinalysis appears to be negative  although with a small amount of blood which is most likely secondary to  his Foley catheter.  Microscopic is normal so far.  Culture is still  pending.  His latest CBC shows white blood count of 12.4, hemoglobin  13.7, hematocrit 39.7, platelet count 151.  His preoperative liver  function tests were normal.   Initially, it was anticipated Mr. Panning would be ready for discharge on  postoperative day 2; however, with the urinary retention, chronic  reinsertion of his Foley catheter, he is being hospitalized at least for  an additional day.  Will plan on discontinuing his Foley catheter on  postoperative day three.  If he is able to void without difficulty, will  consider discharge home on December 23.  If he requires a reinsertion of  Foley catheter, we will plan a urology consultation and based on their  recommendations determine the appropriate discharge date.  Therefore, the anticipated date of discharge  will be either December 23 or April 20, 2006 depending on his urological status.   DISCHARGE MEDICATIONS:  1. Ultram 50 mg 1 to 2 tablets p.o. q. 4-6 h. p.r.n. pain.  2. Lisinopril 40 mg p.o. daily.  3. Potassium chloride 10 mEq p.o. daily.  4. Ranitidine 150 mg p.o. b.i.d.  5. Torsemide 20 mg 2 tablets daily.  6. Simvastatin 40 mg p.o. q.h.s.   DISCHARGE INSTRUCTIONS:  He is to follow a low-fat, low-salt diet.  He  is to avoid driving or heavy lifting  for the next two weeks and is  encouraged to continue to walk and exercise and increase his activity as  tolerated.  He  may shower and clean incisions out with soap and water, and should call  if he develops a fever greater than 101 or redness or drainage from his  incision sites.  He will follow up with Dr. Darrick Penna per stent graft  protocol, and our office will contact him regarding specific appointment  day and time.      Jerold Coombe, P.A.      Janetta Hora. Fields, MD  Electronically Signed    AWZ/MEDQ  D:  04/18/2006  T:  04/19/2006  Job:  [id number]   cc:   Janetta Hora. Darrick Penna, MD  Oneal Deputy. Juanetta Gosling, M.D.  Gerrit Friends. Dietrich Pates, MD, [facility]

## 2010-09-13 NOTE — Procedures (Signed)
[facility]  Patient:    Mathew Padilla, Mathew Padilla Visit Number: [id number] MRN: [id number]          Service Type: OUT Location: RAD Attending Physician:  Fredirick Maudlin Dictated by:   Kari Baars, M.D. Proc. Date: 09/30/01 Admit Date:  09/29/2001 Discharge Date: 09/29/2001                      Pulmonary Function Test Inter.  RESULTS: 1. Spirometry is normal. 2. Lung volumes show mild restrictive change. 3. DLCO normal. 4. Arterial blood gases are normal. Dictated by:   Kari Baars, M.D. Attending Physician:  Fredirick Maudlin DD:  09/30/01 TD:  10/04/01 Job: [id number]

## 2010-09-13 NOTE — Group Therapy Note (Signed)
NAME:  GLENDALE, YOUNGBLOOD NO.:  [id number]   MEDICAL RECORD NO.:  [id number]          PATIENT TYPE:  INP   LOCATION:  A325                          FACILITY:  [facility]   PHYSICIAN:  Edward L. Juanetta Gosling, M.D.DATE OF BIRTH:  Jul 18, 1950   DATE OF PROCEDURE:  12/06/2004  DATE OF DISCHARGE:                                   PROGRESS NOTE   PROBLEM LIST:  1.  Diverticulitis.  2.  Coronary disease.  3.  Sleep apnea.  4.  Aortic aneurysm.   SUBJECTIVE:  Mr. Michalowski says he is better today.  When he got up with  physical therapy it seemed to help him a great deal.  He has no other new  complaints. His abdomen is still sore, but not painful.   OBJECTIVE:  VITAL SIGNS:  Temperature is 97.4, pulse 76, respirations 20,  blood pressure 112/75, O2 saturations 98%.  CHEST:  Chest is clear.  HEART:  Heart is regular with distant heart sounds.  ABDOMEN:  Soft.  No masses are felt.  He is really not very tender.   ASSESSMENT:  He seems to be doing better.   PLAN:  He is to have get a CT today.  This is because he had more trouble  yesterday.  Depending on results of CT, I will plan to have him perhaps able  to be discharged tomorrow.       ELH/MEDQ  D:  12/06/2004  T:  12/06/2004  Job:  [id number]

## 2010-09-13 NOTE — Consult Note (Signed)
NAME:  Mathew Padilla, Mathew Padilla NO.:  [id number]   MEDICAL RECORD NO.:  [id number]          PATIENT TYPE:  INP   LOCATION:  A223                          FACILITY:  [facility]   PHYSICIAN:  Marshall Bing, M.D. [facility] OF BIRTH:  02/15/1951   DATE OF CONSULTATION:  12/02/2004  DATE OF DISCHARGE:                                   CONSULTATION   REFERRING PHYSICIAN:  Oneal Deputy. Juanetta Gosling, M.D.   HISTORY OF PRESENT ILLNESS:  A 60 year old gentleman with known coronary  disease referred for assessment of a sizeable abdominal aortic aneurysm.  Mathew Padilla has divided his care between my office and the [facility].  He was last seen approximately 3 years ago when he was doing generally well.  He has experienced adverse effects to Statins and beta-blockers preventing  these medications from being used in his treatment regimen.  Alternative  lipid lowering drugs have not been tried.  He has not experienced any  significant chest pain.  He does have Class II chronic dyspnea on exertion.  He was admitted to with abdominal pain attributed to diverticular disease.  His initial studies revealed an infrarenal abdominal aortic aneurysm of 5.1  cm.  There is no evidence that his aneurysm is leaking or that his current  admission is related to this entity.   Cardiac history dates to 1999, when the patient presented with acute  inferior myocardial infarction treated with percutaneous intervention in the  circumflex.  He returned with recurrent ischemia approximately 1 year later  requiring intervention in the posterior descending artery and a second  marginal branch of the circumflex.  He has a known total occlusion of a  nondominant right coronary artery and preserved left ventricular systolic  function.   Contributors to cardiovascular disease include hypertension and  hyperlipidemia.   PAST MEDICAL HISTORY:  1.  Obstructive sleep apnea.  2.  Obesity.  3.  GERD.  4.  Diverticular  disease.   MEDICATIONS PRIOR TO ADMISSION:  1.  Ranitidine 150 mg b.i.d.  2.  Lexapro 40 mg daily.  3.  Torsemide 20 mg daily.  4.  Potassium 10 mEq daily.   SOCIAL HISTORY:  Married and lives in [place]; three children; disabled.  A 25-pack-year history of cigarette smoking discontinued 7 years ago. Denies  excessive alcohol use.  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   FAMILY HISTORY:  Mother died at age 72 with lung disease; father died at age  38 due to acute myocardial infarction.  He has three siblings; one sister  has coronary artery disease.   REVIEW OF SYSTEMS:  Notable for recent fever and malaise, right ear  discomfort and left arm discomfort related to an injury 2 months ago.  Other  systems reviewed and are negative.   PHYSICAL EXAMINATION:  GENERAL:  On exam, pleasant gentleman in no acute  distress.  VITAL SIGNS:  Weight is 344, O2 saturation 94% on 2 liters, temperature 98,  heart rate 75 and regular, respirations 20, blood pressure 100/55.  HEENT:  Anicteric sclerae; normal lids and conjunctiva.  SKIN:  No significant lesions.  ENDOCRINE:  No thyromegaly.  HEMATOPOIETIC:  No adenopathy.  LUNGS:  Decreased breath sounds at the bases; otherwise clear.  CARDIAC:  Normal first and second heart sounds; fourth heart sound present.  Normal PMI.  ABDOMEN:  Diffusely tender; normal bowel sounds.  EXTREMITIES:  Trace edema; distal pulses intact.  NEUROLOGIC:  Symmetric strength and tone; normal cranial nerves.   LABORATORY DATA AND X-RAY FINDINGS:  Chest x-ray with no acute  abnormalities.  EKG with no 12-lead; rhythm strip shows sinus normal sinus  rhythm.   Other laboratories notable for a creatinine of 1.1, potassium 3.8.  Normal  CBC.   IMPRESSION:  Mathew Padilla has coronary disease that has been stable for the  past 6 years.  He has no symptoms suggesting myocardial ischemia at the  present time.  I suggested to him that we optimize medical therapy  once his  acute illness has stabilized.  Blood pressure control is currently good.  He  will require further trials with lipid lowering medications.   RECOMMENDATIONS:  He has a moderate sized abdominal aortic aneurysm.  We  will see prior records to determine whether this has been identified in the  past, and how rapidly it has increased in size.  He will require a repeat CT  scan in 3 months to assess change in dimension.  We can arrange for vascular  surgical consultation once his abdominal problems have resolved.   We appreciate the request to become reinvolved in Mathew Padilla care and will  be happy to follow him as needed in hospital and continuously after  discharge.      Millville Bing, M.D. [facility]  Electronically Signed     RR/MEDQ  D:  12/02/2004  T:  12/03/2004  Job:  [id number]

## 2010-09-13 NOTE — Group Therapy Note (Signed)
NAME:  CLYDE, ZARRELLA NO.:  [id number]   MEDICAL RECORD NO.:  [id number]          PATIENT TYPE:  INP   LOCATION:  A325                          FACILITY:  [facility]   PHYSICIAN:  Edward L. Juanetta Gosling, M.D.DATE OF BIRTH:  09/05/1950   DATE OF PROCEDURE:  DATE OF DISCHARGE:                                   PROGRESS NOTE   PROBLEM:  Diverticulitis.   SUBJECTIVE:  Mr. Mcglamery says he feels much better and wants to go home. He  has no new complaints, he had been able to eat. He feels like he is getting  ready to have a bowel movement.   OBJECTIVE:  His CT yesterday did not show any further leak, still shows some  inflammatory changes but basically looked better. I have told him I think it  is expected for him still has some inflammatory change. His temperature is  97.5, pulse 74, respirations 24, blood pressure 118/68, O2 sats 94% on room  air.   ASSESSMENT:  He is much improved.   PLAN:  Go ahead and discharge him home today. Please see discharge summary  for details.       ELH/MEDQ  D:  12/07/2004  T:  12/07/2004  Job:  [id number]

## 2010-09-13 NOTE — Group Therapy Note (Signed)
NAME:  Mathew, Padilla NO.:  [id number]   MEDICAL RECORD NO.:  [id number]          PATIENT TYPE:  INP   LOCATION:  A223                          FACILITY:  [facility]   PHYSICIAN:  Edward L. Juanetta Gosling, M.D.DATE OF BIRTH:  [birth year]   DATE OF PROCEDURE:  DATE OF DISCHARGE:                                   PROGRESS NOTE   PROBLEM:  Abdominal discomfort.   SUBJECTIVE:  Mathew Padilla says he is a little better. He is still uncomfortable  in general, having trouble with aching all over.  His physical examination  today shows of temperature of 97.2 pulse 87, respirations 20, blood pressure  94/58.  His weight is 344.  His O2 sat is 94% on room air.  His abdomen is  actually fairly soft without any masses.  He has minimal tenderness in the  left lower quadrant.  His heart is regular.  Cardiology's help is noted and  appreciated.   ASSESSMENT:  He has diverticulitis.   PLAN:  To continue his treatments and medications, and he is going to be set  up for a full liquid diet per Dr. Lovell Sheehan today.  No other new changes.       ELH/MEDQ  D:  12/03/2004  T:  12/03/2004  Job:  [id number]

## 2010-09-13 NOTE — Letter (Signed)
February 16, 2006     RE:  JAHIEM, FRANZONI  MRN:  [id number]  /  DOB:  1950-09-17   CONTINUATION    IMPRESSION:  Mr. Vandenbrink is doing well from a symptomatic standpoint.  He has  had normal left ventricular systolic function in the past.  He reportedly  had stress testing at the [place] a year ago, apparently without significant  abnormalities.  In this setting, with relatively low risk surgery planned, I  believe that no further assessment is warranted.  We will add a beta  blocker, both to increase safety in the perioperative interval, as well as  to reduce his blood pressure further.  He will restart statin with plans to  find some drug that he can continue to take.  We will be happy to be  available as needed in the hospital should he experience problems with his  stent graft with which we can assist.   Thanks so much for sending him back to me.  Will attempt to maintain  continuous followup and to optimally treat his cardiovascular risk factors.  He has also asked whether surgical intervention for obesity is warranted.  He certainly meets criteria, and a procedure can be considered in the  future.    Sincerely,      Gerrit Friends. Dietrich Pates, MD, [facility]    RMR/MedQ  DD: 02/16/2006  DT: 02/17/2006  Job #: [id number]

## 2010-09-13 NOTE — H&P (Signed)
NAME:  Mathew Padilla, ORGERON NO.:  [id number]   MEDICAL RECORD NO.:  [id number]          PATIENT TYPE:  EMS   LOCATION:  ED                            FACILITY:  [facility]   PHYSICIAN:  Angus G. Renard Matter, MD   DATE OF BIRTH:  1950/05/24   DATE OF ADMISSION:  12/01/2004  DATE OF DISCHARGE:  LH                                HISTORY & PHYSICAL   ADDENDUM:  This patient was found to have a 5.1 cm aortic aneurysm on CT.       AGM/MEDQ  D:  12/01/2004  T:  12/01/2004  Job:  [id number]

## 2010-09-13 NOTE — Op Note (Signed)
NAME:  Mathew Padilla, Mathew Padilla NO.:  [id number]   MEDICAL RECORD NO.:  [id number]          PATIENT TYPE:  INP   LOCATION:  3305                         FACILITY:  [facility]   PHYSICIAN:  Janetta Hora. Fields, MD  DATE OF BIRTH:  Aug 22, 1950   DATE OF PROCEDURE:  04/16/2006  DATE OF DISCHARGE:                               OPERATIVE REPORT   PROCEDURE:  Insertion of Zenith aneurysm stent graft.   PREOPERATIVE DIAGNOSIS:  Abdominal aortic aneurysm.   POSTOPERATIVE DIAGNOSIS:  Abdominal aortic aneurysm.   ANESTHESIA:  General.   INDICATIONS:  The patient is a 60 year old male with a 5.3 cm infrarenal  abdominal aortic aneurysm.  He is extremely obese weighing greater than  145 kg. The risks, benefits, possible complications, and follow-up for  stent graft repair were discussed with the patient prior to the  procedure.   OPERATIVE FINDINGS:  1. Main body right side, 30 x 82 graft.  2. Left limb of 20 x 71.  3. Right limb 20 x 54.  4. 250 mL of IV contrast.   OPERATIVE DETAILS:  After obtaining informed consent, the patient was  taken to the operating room.  The patient was placed in the supine  position on the operating table.  After induction of general anesthesia  and endotracheal intubation, a Foley catheter was placed.  Next, the  patient's entire abdomen and upper legs were prepped and draped in the  usual sterile fashion.  Bilateral groin incisions were made for exposure  of the common femoral arteries.  The left common femoral artery exposure  and repair is dictated as a separate operative note by Dr. Edilia Bo.   The right common femoral artery was dissected free circumferentially and  controlled proximally and distally at the level of the inguinal ligament  and at the femoral bifurcation with vessel loops.  Next, the patient was  given 10,000 units of intravenous heparin.  The left femoral artery was  accessed with a single wall puncture technique and a 0.035  J-tip  guidewire was threaded up into the abdominal aorta.  A 9-French sheath  was then placed over the guidewire in the left femoral artery.  A 5-  French pigtail catheter was then placed over the guidewire in the  abdominal aorta.  Abdominal aortogram was then obtained to identify the  length from the renal arteries down to the aortic bifurcation.  After  determining the length, a 30 x 82 main body Zenith graft was brought up  on the operative field.  The right groin was accessed with a single wall  puncture technique and a 0.035 J-tip guidewire threaded up in the  abdominal aorta.  An H1 catheter was then placed over the guidewire and  the J-wire exchanged for a 0.035 Amplatz wire.  The main body graft was  then placed over the guidewire and up to an area adjacent to the renal  arteries.  Confirmatory magnified views were then made with contrast  angiography to confirm, again, the level of the renal arteries.  The  left main renal artery was below [place].  There  also seemed to be a small  accessory renal artery on the right side which was equivalent to the  level of the left renal artery.  We began to deploy the main body of the  stent just below the main left renal artery.  After two stents were  opened, again, position was confirmed to be below the level of the renal  arteries.  The main body was then deployed all the way down to the  bifurcation of the stent graft.  At this point, the suprarenal stent was  deployed in the usual fashion.  A confirmatory contrast angiogram was  also obtained to confirm that the fabric of the graft was approximately  1 cm below the left renal artery and both sides of renal arteries  opacified easily.   Next, the contralateral gate was cannulated with an 0.035 J-tip wire.  The pigtail catheter was then advanced up into the main body of the  graft and twirled.  This was confirmed to be within the graft.  A 0.035  Amplatz wire was then exchanged through the  pigtail catheter.  The  pigtail catheter was then removed and a 20 x 71 limb was selected for  the contralateral side.  This was determined by placing a pigtail  catheter through the sheath and measuring from the distal end of the  stent graft down to the iliac bifurcation.  The 20 x 71 limb was  introduced in the usual fashion and with approximately 1 1/2 stent  overlap, this was deployed.  Attention was then turned back to the  ipsilateral side.  The remainder of the right main body limb was  deployed.  The top cap was then recaptured in the usual fashion and the  dilator removed.  The pigtail catheter was then used to assess the right  iliac system.  After measuring the length from the end of the  ipsilateral limb down to the iliac bifurcation, a 20 x 54 limb was  selected to place on the right side.  This was then placed over the  Amplatz wire into position with approximately two stent overlap.  The  dilator was then removed from the sheath as the stent had been deployed.  A coated balloon was then brought up on the field and the top portion of  the graft was dilated as well as all joints and all distal end portions  of the stent.  Completion contrast angiogram was then obtained which  showed the proximal aspect of the stent to be in good position with good  filling of the renal arteries.  There was no type 2 or type 1 leak  found.  The stent graft filled well.  There was good runoff down through  the iliac arteries.  The left and right hypogastric arteries were  patent.   The patient had been given an additional 5000 units of heparin during  the course of the case.  At this point, all guidewires and catheters  were removed.  The right femoral sheath was removed and control was  obtained with a Henley clamp.  The distal artery was controlled with a  vessel loop.  There was a fair amount of plaque along the posterior wall of the artery.  The guidewires and sheaths were removed from  the left  femoral artery in a similar fashion.  The closure and repair of the left  femoral system was dictated by Dr. Edilia Bo.  The right femoral artery  was repaired using  a running 5-0 Prolene suture.  Just prior to  completion of the anastomosis, this was forward bled, back bled and  thoroughly flushed.  The anastomosis was secured, clamps were released,  there was a palpable pulse above and below the repair site which was  symmetric.  There was also a pulse in the left groin.  The feet were  pink and warm in appearance with good Doppler signals.  Next, the right  groin was thoroughly irrigated and then closed in multiple layers with 2-  0, 3-0, and a 4-0 Vicryl subcuticular stitch.  Two separate stab  incisions had been made above and below the incision for bringing out  vessel loops due to the patient's obesity.  Similar stab incisions were  made on the other side.  These were closed with a Vicryl stitch.  The  remainder of the closure on the left side is dictated by Dr. Edilia Bo.   Next, the patient was extubated in the operating room and taken to the  recovery room in stable condition.  Instrument, sponge, and  needle  counts were correct at the end of the case.  There were no  complications.  250 mL of contrast was used during the procedure.      Janetta Hora. Fields, MD  Electronically Signed     CEF/MEDQ  D:  04/16/2006  T:  04/16/2006  Job:  [id number]

## 2010-09-13 NOTE — Group Therapy Note (Signed)
NAME:  MARQUIS, DOWN NO.:  [id number]   MEDICAL RECORD NO.:  [id number]          PATIENT TYPE:  INP   LOCATION:  A325                          FACILITY:  [facility]   PHYSICIAN:  Edward L. Juanetta Gosling, M.D.DATE OF BIRTH:  02-May-1950   DATE OF PROCEDURE:  DATE OF DISCHARGE:                                   PROGRESS NOTE   PROBLEM:  Diverticulitis, hypertension, coronary disease, shoulder pain,  aortic aneurysm.   SUBJECTIVE:  Mr. Aber says he does not feel as well today.  He had a sense  that he had a fever last night, began having sweating.  He is having a  little more abdominal pain as well.   Exam shows that his temperature is 99.3, pulse 89, respirations 22, blood  pressure 101/62.  O2 sats 95%.  His abdomen is actually fairly soft, obese  without masses.  Bowel sounds are active.  Extremities showed no edema;  however, he just does not look as good as he has.   He and I discussed the discharge planning.  He says that he just does not  feel like he could possibly go home today, that he is not able to manage on  his own.  I am going to go ahead and order another CT to make sure that we  are not missing perhaps further problems with abscess formation, etc.  I am  going to try to get him up a bit more.  I am going to ask for physical  therapy to see him and see if they can help [place] get him stronger and more  able to manage.       ELH/MEDQ  D:  12/05/2004  T:  12/05/2004  Job:  [id number]

## 2010-09-13 NOTE — Op Note (Signed)
NAME:  Mathew Padilla, Mathew Padilla NO.:  [id number]   MEDICAL RECORD NO.:  [id number]          PATIENT TYPE:  INP   LOCATION:  3305                         FACILITY:  [facility]   PHYSICIAN:  Di Kindle. Edilia Bo, M.D.DATE OF BIRTH:  1951/03/10   DATE OF PROCEDURE:  04/16/2006  DATE OF DISCHARGE:                               OPERATIVE REPORT   PREOPERATIVE DIAGNOSIS:  Abdominal aortic aneurysm.   POSTOPERATIVE DIAGNOSIS:  Abdominal aortic aneurysm.   PROCEDURE:  Exposure of left femoral artery for endovascular repair of  abdominal aortic aneurysm which is dictated separately by Dr. Darrick Penna.   TECHNIQUE:  The patient was taken to the operating room and received a  general anesthetic.  An oblique incision was made just above the  inguinal crease on the left after the abdomen and groins had been  prepped and draped in the usual sterile fashion.  An oblique incision  was made above the inguinal crease and the dissection carried down quite  deep down to the common femoral artery which was controlled with vessel  loop.  The superficial femoral artery and deep femoral artery were also  controlled with vessel loops and tied.  Because of the extreme depth  down to the artery, I tunneled the blue loop superiorly up onto the  abdomen to allow this to be pulled out of the way when the catheter was  introduced.  The endovascular repair is dictated separately by Dr.  Darrick Penna.  The device was introduced in the left groin percutaneously and  this was extended with Potts scissors to convert it to a transverse  arteriotomy. This was then closed with interrupted 5-0 Prolene sutures.  Prior to completing the closure, the artery was back bled and flushed  appropriately and the anastomosis completed.  Flow was re-established to  the left foot.  Hemostasis was obtained in the wound.  The wound was  closed with two deep layers of 2-0 Vicryl, a subcutaneous layer of 3-0  Vicryl, and the skin closed  with a 4-0 subcuticular stitch.  The stab  wound where the blue loop was brought out was closed with a 4-0 Vicryl.  A sterile dressing was applied.  The patient tolerated the procedure  well and was transferred to the recovery room in satisfactory condition.  All needle and sponge counts were correct.      Di Kindle. Edilia Bo, M.D.  Electronically Signed     CSD/MEDQ  D:  04/16/2006  T:  04/17/2006  Job:  [id number]

## 2010-09-13 NOTE — Discharge Summary (Signed)
NAME:  Mathew Padilla, Mathew Padilla NO.:  [id number]   MEDICAL RECORD NO.:  [id number]          PATIENT TYPE:  INP   LOCATION:  2033                         FACILITY:  [facility]   PHYSICIAN:  Janetta Hora. Fields, MD  DATE OF BIRTH:  1951/04/04   DATE OF ADMISSION:  04/16/2006  DATE OF DISCHARGE:  04/19/2006                               DISCHARGE SUMMARY   ADMISSION DIAGNOSIS:  Abdominal aortic aneurysm, asymptomatic.   DISCHARGE/SECONDARY DIAGNOSES:  1. Asymptomatic abdominal aortic aneurysm, status post stent graft.  2. Postoperative urinary retention with history of prostatitis.  3. Postoperative mild hyponatremia, improved.  4. Mild postoperative hypokalemia, improved.  5. Obesity.  6. Obstructive sleep apnea.  7. History of pilonidal cyst removal approximately 20 years ago.  8. History of coronary artery disease, status post myocardial      infarction 1999.  9. Percutaneous coronary intervention x2.  10.Hypertension.  11.Gastroesophageal reflux disease.  12.History of diverticulitis.  13.Benign prostatic hypertrophy.  14.History of tobacco abuse, quit in 1998.   ALLERGIES:  NO KNOWN DRUG ALLERGIES.   BRIEF HISTORY:  Mathew Padilla is a 60 year old male seen by Dr. Fabienne Padilla for asymptomatic abdominal aortic aneurysm, which measured 5.1 cm  at the greatest aneurysm diameter.  Dr. Darrick Padilla recommended elective  repair.  Diagnostic tests revealed that he was a candidate for stent  graft repair.  After discussing risks, benefits and alternatives, the  patient agreed to proceed.  His preoperative Doppler studies showed no  evidence of left internal carotid artery stenosis and more than 39%  right internal carotid stenosis and ABIs greater than 1 on the right and  0.9 on the left.   HOSPITAL COURSE:  Mathew Padilla was electively admitted to [facility] on April 18, 2006.  He underwent venous stent graft repair  of his abdominal aortic aneurysm ( main body 30 x 82 on  the right, on  the left contralateral size 20 x 71 and right ipsilateral 20 x 54).  Postoperative ABIs were within normal limits at 1.04 on the right and  1.18 on the left.  He had an CT angio of the abdomen and pelvis on  April 18, 2006, which was reviewed by Dr. Darrick Padilla who felt no problems  were identified in relation to the stent graft position.  Following the  CT, it was actually felt that he may be able to be discharged, but he  developed problems with urinary retention requiring reinsertion of his  Foley catheter.  The catheter remained for an additional 24 hours and he  was started on Flomax.  A urine culture was also sent and was still  pending at the time of discharge, although urinalysis showed negative  leukocytes and nitrites.  Postoperative day 3, his Foley catheter was  removed and fortunately he was able to void without difficulty.  During  his hospitalization, he was also monitored for hypokalemia and  hyponatremia with a sodium of 124 and potassium of 3.4.  His potassium  was supplemented and once his urinary retention resolved his labs  improved to a discharge sodium of  133 and potassium 3.9.  Otherwise, [name] had an uneventful hospital course.  He was tolerating a renal  diet, ambulating and pain was controlled on no medication.  Vitals were  also stable.  He was afebrile and saturations were 92% on room air.  At  discharge, his incision appeared to be healing without signs of  infection.   DISPOSITION:  Mathew Padilla was felt appropriate for discharge home on  postoperative day 3, April 19, 2006 in stable condition.   LABORATORY DATA:  Most recent labs, at discharge, showed a sodium of  133, potassium 3.9, chloride 95, CO2 33, blood glucose 123, BUN 13,  creatinine 1.1, calcium 8.3.  White blood count 12.4, hemoglobin 13.7,  hematocrit 39.7, platelet count 151.   DISCHARGE MEDICATIONS:  1. Flomax 0.4 mg p.o. daily x1 week.  2. Ultram 50 mg 1-2 tablets p.o.  q.4-6 hours p.r.n. pain.  3. Lisinopril 40 mg p.o. daily.  4. Potassium 20 mEq p.o. daily.  5. Amantidine 150 mg p.o. b.i.d.  6. Torsemide 20 mg 2 p.o. daily.  7. Simvastatin 40 mg p.o. q.h.s.   DISCHARGE INSTRUCTIONS:  He was instructed to follow with a low-salt  diet and to avoid driving or heavy lifting for 2 weeks.  To continue  breathing and walking exercises.  May shower, wash and clean incision  gently with soap and water.  He should call if he develops fever greater  than 101 or redness or drainage from incision site.  He will follow up  with Dr. Darrick Padilla, CVTS office for stent graft protocol.  The CV[facility] office  will contact him regarding specific appointment date and time.      Jerold Coombe, P.A.      Janetta Hora. Fields, MD  Electronically Signed    AWZ/MEDQ  D:  06/09/2006  T:  06/10/2006  Job:  [id number]

## 2010-09-13 NOTE — Group Therapy Note (Signed)
NAME:  RICKE, KIMOTO NO.:  [id number]   MEDICAL RECORD NO.:  [id number]          PATIENT TYPE:  INP   LOCATION:  A223                          FACILITY:  [facility]   PHYSICIAN:  Edward L. Juanetta Gosling, M.D.DATE OF BIRTH:  06-15-1950   DATE OF PROCEDURE:  12/04/2004  DATE OF DISCHARGE:                                   PROGRESS NOTE   PROBLEM:  Diverticulitis.   SUBJECTIVE:  Mr. Roudebush says he is feeling a little better.  His bowels moved  yesterday, but it was difficult and painful.  He has no other new  complaints.   OBJECTIVE:  VITAL SIGNS:  Temperature is 97.9, pulse 86, respirations 18,  blood pressure 110/64, O2 saturations 97% on room air.  CHEST:  His chest is much clear.  HEART:  His heart regular.  ABDOMEN:  He has very little abdominal tenderness.   White count 8500, hemoglobin is 13.5.  His glucose 106, sodium 134.   ASSESSMENT:  He seems to be much better.   PLAN:  He was able to tolerate his food, so I am going to continue his  medications, continue his treatments and follow.       ELH/MEDQ  D:  12/04/2004  T:  12/04/2004  Job:  [id number]

## 2010-09-13 NOTE — Group Therapy Note (Signed)
NAME:  Mathew Padilla, Mathew Padilla NO.:  [id number]   MEDICAL RECORD NO.:  [id number]          PATIENT TYPE:  INP   LOCATION:  A223                          FACILITY:  [facility]   PHYSICIAN:  Edward L. Juanetta Gosling, M.D.DATE OF BIRTH:  Mar 03, 1951   DATE OF PROCEDURE:  12/02/2004  DATE OF DISCHARGE:                                   PROGRESS NOTE   SUBJECTIVE:  Mathew Padilla was admitted with diverticulitis, and during his  evaluation for diverticulitis, he was found to have an approximately 5.1-cm  aortic aneurysm. He had a small leak from one of the perforated diverticula  that appears to be walled off at this point. He has a history of coronary  artery occlusive disease and has been followed by [facility] cardiology but has  not been seen in at least a year and wants to get an evaluation while he is  here get back in the loop. Otherwise, he is doing about the same, still  complaining of abdominal discomfort.   OBJECTIVE:  VITAL SIGNS:  His temperature today is 98, pulse 75,  respirations 22, blood pressure 97/53, O2 saturation 94% on room air. Height  70 inches, weight 344.  GENERAL:  He is wearing C-PAP because of his sleep apnea.  CHEST:  His chest is clear.  HEART:  His heart is regular.  ABDOMEN:  He has minimal left lower quadrant tenderness, direct point  tenderness.  EXTREMITIES:  His extremities showed trace edema.   ASSESSMENT:  He has diverticulitis. I have discussed this with Dr. Lovell Sheehan  whose plan is to try to treat him medically without surgery considering the  other medical problems. He has a history of coronary artery occlusive  disease. He has sleep apnea and has an aortic aneurysm. He says that he was  diagnosed with an aortic aneurysm some time approximately 10 years ago but  has not had routine follow-up.   PLAN:  We will plan to continue current medications and follow. Will ask for  cardiology consultation as well.       ELH/MEDQ  D:  12/02/2004  T:   12/02/2004  Job:  [id number]

## 2010-09-13 NOTE — Letter (Signed)
April 30, 2006    Ramon Dredge L. Juanetta Gosling, M.D.  [address]  [place], [place] [address]   RE:  DAYSEN, GUNDRUM  MRN:  [id number]  /  DOB:  [birth year]   Dear Renae Fickle,   Mr. Scurlock returns to the office 2 weeks following placement of a stent  graft for his abdominal aortic aneurysm.  The procedure was complicated  only by some urinary retention, and the patient was discharged after 4  days.  He has noted some drainage and blood on the dressing covering his  wound, but otherwise has done well.  He describes episode of discomfort  in his feet.  This is variably identified as cramps or paresthesias.  They occur whether his feet are elevated or dependent and are relieved  by walking.   Current medications include KCl 10 mEq daily, aspirin 325 mg daily,  lisinopril 40 mg daily, etodolac 500 mg b.i.d. p.r.n.,  ranitidine 150  mg daily, Torsemide 20 mg daily, Rosuvastatin 20 mg daily, Toprol 50 mg  daily.   PHYSICAL EXAMINATION:  On exam, pleasant overweight gentleman in no  acute distress.  The weight is 341, 3 pounds less than in October.  Blood pressure 105/70, heart rate 72 and regular, respirations 60.  NECK:  No jugular venous distention; normal carotid upstrokes without  bruits.  LUNGS:  Clear, with decreased breath sounds at the bases; otherwise  clear.  CARDIAC:  Normal first and second heart sounds; fourth heart sound  present.  ABDOMEN:  Soft, nontender; no organomegaly.  EXTREMITIES:  Approximately 10 cm incision over the right femoral  artery.  There is some lack of union of the wound edges, with minimal  purulent drainage.  Faint erythematous punctate macular lesions over the  dorsum of the right foot; none are present on the left; pulses are no  more than 1+, but Doppler studies in hospital were normal, and bi or  triphasic signals are noted by our office Doppler device.   IMPRESSION:  Mr. Shiffler is doing generally well.  The etiology for the  symptoms in his feet is unclear.  He  will be evaluated by Dr. Darrick Penna  later this week during a preoperative visit.  I suggested continuing wet  to dry dressings to promote ongoing healing of his surgical wound.   Lipid profile was assessed a month ago.  It was excellent on his current  therapy.  Vaccinations are up to date.  I will reassess this nice  gentleman in 6 months.    Sincerely,      Gerrit Friends. Dietrich Pates, MD, [facility]  Electronically Signed    RMR/MedQ  DD: 04/30/2006  DT: 04/30/2006  Job #: [id number]   CC:    Janetta Hora. Darrick Penna, MD

## 2010-09-13 NOTE — Consult Note (Signed)
NAME:  Mathew Padilla, Mathew Padilla NO.:  [id number]   MEDICAL RECORD NO.:  [id number]          PATIENT TYPE:  INP   LOCATION:  A223                          FACILITY:  [facility]   PHYSICIAN:  Dalia Heading, M.D.  DATE OF BIRTH:  05/19/1950   DATE OF CONSULTATION:  12/01/2004  DATE OF DISCHARGE:                                   CONSULTATION   REASON FOR CONSULTATION:  Sigmoid colon diverticulitis.   HISTORY OF PRESENT ILLNESS:  The patient is a 60 year old white male who  presents with a three day history of worsening lower abdominal pain.  He was  seen in the emergency room on November 28, 2004, and was diagnosed with  dehydration and diarrhea.  He was sent home and continued to worsen over the  next three days.  He presented back to the emergency room with worsening  lower abdominal pain.  CT scan of the abdomen and pelvis reveals sigmoid  diverticulitis with one small focus of extraluminal air.  There is no free  fluid in the pelvis.  An incidental finding of a 5.1 cm of infrarenal  abdominal aortic aneurysm was also noted.   The patient states that he has not had a bowel movement in the last several  days.  His appetite has decreased.  He states he has had intermittent fevers  and nausea.   PAST MEDICAL HISTORY:  1.  Coronary artery disease.  2.  Myocardial infarction.  3.  GERD.   PAST SURGICAL HISTORY:  1.  No previous abdominal surgeries.  2.  Coronary stent placement.   CURRENT MEDICATIONS:  1.  Zantac 150 mg p.o. b.i.d.  2.  Potassium supplements.  3.  Lisinopril 40 mg p.o. daily.  4.  Furosemide 20 mg p.o. daily.   ALLERGIES:  No known drug allergies.   REVIEW OF SYSTEMS:  The patient states that several years ago he was told he  had a small aneurysm, but there was nothing to worry about.  He also weights  364 pounds.   SOCIAL HISTORY:  The patient denies any significant alcohol or tobacco use.   PHYSICAL EXAMINATION:  GENERAL APPEARANCE:  The patient is  a pleasant white  male who is lying in the bed in mild discomfort.  VITAL SIGNS:  T-max 99.3, vital signs stable.  ABDOMEN:  Soft with tenderness noted specifically in the left lower quadrant  extending over to the right lower quadrant.  No rigidity is noted.  Abdomen  is nontense.  No hepatosplenomegaly or masses noted.  RECTAL:  Deferred at this time.   LABORATORY DATA:  White blood cell count 11.4, hematocrit 45, platelets  179,000.  Met-7 was for the most part unremarkable.   IMPRESSION:  1.  Sigmoid colon diverticulitis with small contained perforation, no      abscess noted.  2.  A 5 cm infrarenal abdominal aortic aneurysm.   PLAN:  As the patient has a contained small perforation, observation is  warranted at this time.  Bowel rest and IV antibiotics have been already  ordered.  Ideally, I would like to  avoid surgery if possible.  Should his  condition worsen during his hospitalization, a partial colectomy with  possible colostomy would be needed.  In addition, I will talk to Dr. Juanetta Gosling  who is his primary doctor concerning his infrarenal aortic aneurysm.  Consultation with Vascular Surgery may  be needed if this has increased  significantly over the past few years.  This has all been explained to the  patient and family, who agreed to the treatment and plan.       MAJ/MEDQ  D:  12/01/2004  T:  12/02/2004  Job:  [id number]   cc:   Ramon Dredge L. Juanetta Gosling, M.D.  [address]  [place]  [place] [address]  Fax: [phone]

## 2010-11-21 ENCOUNTER — Encounter: Payer: Self-pay | Admitting: Cardiology

## 2010-11-23 ENCOUNTER — Emergency Department (HOSPITAL_COMMUNITY): Payer: Medicare Other

## 2010-11-23 ENCOUNTER — Emergency Department (HOSPITAL_COMMUNITY)
Admission: EM | Admit: 2010-11-23 | Discharge: 2010-11-23 | Disposition: A | Discharge status: Home / Self Care | Payer: Medicare Other | Attending: Emergency Medicine | Admitting: Emergency Medicine

## 2010-11-23 ENCOUNTER — Other Ambulatory Visit: Payer: Self-pay

## 2010-11-23 ENCOUNTER — Encounter (HOSPITAL_COMMUNITY): Payer: Self-pay | Admitting: Emergency Medicine

## 2010-11-23 DIAGNOSIS — M545 Low back pain, unspecified: Secondary | ICD-10-CM | POA: Insufficient documentation

## 2010-11-23 DIAGNOSIS — M5430 Sciatica, unspecified side: Secondary | ICD-10-CM

## 2010-11-23 DIAGNOSIS — N509 Disorder of male genital organs, unspecified: Secondary | ICD-10-CM | POA: Insufficient documentation

## 2010-11-23 DIAGNOSIS — J189 Pneumonia, unspecified organism: Secondary | ICD-10-CM | POA: Insufficient documentation

## 2010-11-23 DIAGNOSIS — R0602 Shortness of breath: Secondary | ICD-10-CM | POA: Insufficient documentation

## 2010-11-23 DIAGNOSIS — Z87891 Personal history of nicotine dependence: Secondary | ICD-10-CM | POA: Insufficient documentation

## 2010-11-23 LAB — URINALYSIS, ROUTINE W REFLEX MICROSCOPIC
Bilirubin Urine: NEGATIVE
Hgb urine dipstick: NEGATIVE
Ketones, ur: NEGATIVE mg/dL
Nitrite: NEGATIVE
Protein, ur: NEGATIVE mg/dL
Urobilinogen, UA: 0.2 mg/dL (ref 0.0–1.0)

## 2010-11-23 LAB — BASIC METABOLIC PANEL
BUN: 17 mg/dL (ref 6–23)
Calcium: 9.1 mg/dL (ref 8.4–10.5)
Creatinine, Ser: 0.71 mg/dL (ref 0.50–1.35)
GFR calc Af Amer: >60 mL/min (ref >60)
GFR calc non Af Amer: >60 mL/min (ref >60)
Potassium: 3.9 mEq/L (ref 3.5–5.1)

## 2010-11-23 LAB — DIFFERENTIAL
Basophils Absolute: 0 10*3/uL (ref 0.0–0.1)
Basophils Relative: 0 % (ref 0–1)
Eosinophils Absolute: 0.1 10*3/uL (ref 0.0–0.7)
Eosinophils Relative: 1 % (ref 0–5)
Monocytes Absolute: 0.9 10*3/uL (ref 0.1–1.0)
Monocytes Relative: 7 % (ref 3–12)
Neutrophils Relative %: 85 % — ABNORMAL HIGH (ref 43–77)

## 2010-11-23 LAB — CBC
HCT: 43.7 % (ref 39.0–52.0)
MCH: 31.7 pg (ref 26.0–34.0)
MCHC: 34.3 g/dL (ref 30.0–36.0)
RDW: 13.5 % (ref 11.5–15.5)

## 2010-11-23 LAB — CARDIAC PANEL(CRET KIN+CKTOT+MB+TROPI): Troponin I: <0.3 ng/mL (ref <0.30)

## 2010-11-23 MED ORDER — SODIUM CHLORIDE 0.9 % IV SOLN
Freq: Once | INTRAVENOUS | Status: AC
  Administered 2010-11-23: 17:00:00 via INTRAVENOUS

## 2010-11-23 MED ORDER — HYDROMORPHONE HCL 1 MG/ML IJ SOLN
1.0000 mg | Freq: Once | INTRAMUSCULAR | Status: AC
Start: 2010-11-23 — End: 2010-11-23
  Administered 2010-11-23: 1 mg via INTRAVENOUS
  Filled 2010-11-23: qty 1

## 2010-11-23 MED ORDER — AZITHROMYCIN 250 MG PO TABS
500.0000 mg | ORAL_TABLET | Freq: Once | ORAL | Status: AC
  Administered 2010-11-23: 500 mg via ORAL
  Filled 2010-11-23: qty 2

## 2010-11-23 MED ORDER — AZITHROMYCIN 250 MG PO TABS: 250.0000 mg | ORAL_TABLET | Freq: Every day | ORAL | Status: AC

## 2010-11-23 MED ORDER — OXYCODONE-ACETAMINOPHEN 5-325 MG PO TABS
1.0000 | ORAL_TABLET | Freq: Once | ORAL | Status: AC
  Administered 2010-11-23: 1 via ORAL
  Filled 2010-11-23: qty 1

## 2010-11-23 MED ORDER — OXYCODONE-ACETAMINOPHEN 5-325 MG PO TABS: 1.0000 | ORAL_TABLET | Freq: Four times a day (QID) | ORAL | Status: AC | PRN

## 2010-11-23 MED ORDER — ONDANSETRON HCL 4 MG/2ML IJ SOLN
4.0000 mg | Freq: Once | INTRAMUSCULAR | Status: AC
  Administered 2010-11-23: 4 mg via INTRAVENOUS
  Filled 2010-11-23: qty 2

## 2010-11-23 NOTE — ED Notes (Signed)
Pt and wife upset with wait time. Apologized to them for delay. Explained that md would be in to see them asap. Pt and wife verbalized understanding.

## 2010-11-23 NOTE — ED Notes (Signed)
Patient c/o lower back pain that radiates into right thigh. Patient also reports a "twinge in chest that comes and goes." Per patient hx of MI x2. Patient also reports some shortness of breath, numbness in face, and feeling of light headiness. Breathing slightly labored. Patient slightly diaphoretic.

## 2010-11-23 NOTE — ED Notes (Signed)
Encouraged pt to give urine specimen. Pt stated he would try to void.

## 2010-11-23 NOTE — ED Provider Notes (Signed)
Scribed for Dr. Estell Harpin, the patient was seen in room 19. This chart was scribed by Hillery Hunter. This patient's care was started at 16:20.    History    Chief Complaint  Patient presents with     . Back Pain   Shortness of Breath   Patient is a 60 y.o. male presenting with back pain. The history is provided by the patient.  Back Pain  This is a new (has had prior similar, milder sx) problem. The current episode started 12 to 24 hours ago. The problem occurs constantly (waxes, wanes). The problem has not changed since onset.The pain is associated with no known injury. Pain location: right lower back. The pain radiates to the right thigh (and right testicle, described as burning over right thigh). Associated symptoms include leg pain. Pertinent negatives include no chest pain, no fever, no abdominal pain, no bladder incontinence, no dysuria and no weakness.   Patient describes new, sudden onset of right lower back pain that occurs intermittently today. He states the pain radiates to his right testicle and as a "burning" pain onto the ventral surface of his right thigh. He reports shortness of breath but states this has been a chronic problem for longer than a year and has not worsened. He denies other related symptoms and any allergies to medications.  Past Medical History  Diagnosis Date  . ASCVD (arteriosclerotic cardiovascular disease)     ballon angioplasty of PDA and 2nd marginal in 3/00  . HTN (hypertension)   . AAA (abdominal aortic aneurysm)     stent graft repair 12/07  . Laryngeal carcinoma     resection and radiation therapy 2010-11  . Diverticulosis of colon     last ep 2006  . Obesity     BMI 47; lap band surgery 2010  . GERD (gastroesophageal reflux disease)     hiatal hernia prior treatment wityh PPI discontinied   . Sleep apnea     BiPAP mask utilized   . MI (myocardial infarction)     Past Surgical History  Procedure Date  . Stent graft for aaa 2007   . Resection of larngeal carcinoma 2010  . Left shoulder reconstruction 2011  . Laparoscopic gastric banding   . Carotid stent   . Eye surgery   . Cataract extraction     Family History  Problem Relation Age of Onset  . Heart attack Father     deceased  . Diabetes Father   . Heart failure Father   . Hyperlipidemia Father   . Hypertension Father   . Lung cancer Mother     deceased   . Hypertension Mother   . Hyperlipidemia Mother   . Heart failure Mother   . Diabetes Mother   . Cancer Sister     History  Substance Use Topics  . Smoking status: Former Smoker    Quit date: 11/26/1997  . Smokeless tobacco: Never Used   Comment: 40 pack years; quit 2000  . Alcohol Use: No      Review of Systems  Constitutional: Negative for fever.  HENT: Negative for congestion.   Eyes: Negative for visual disturbance.  Respiratory: Positive for shortness of breath (chronically over one year). Negative for cough and chest tightness.   Cardiovascular: Negative for chest pain and palpitations.  Gastrointestinal: Negative for nausea, abdominal pain and blood in stool.  Genitourinary: Positive for testicular pain. Negative for bladder incontinence, dysuria and hematuria.  Musculoskeletal: Positive for back pain. Negative for joint  swelling.  Neurological: Negative for weakness.    Physical Exam  BP 110/55  Pulse 71  Temp(Src) 98.1 F (36.7 C) (Oral)  Resp 18  Ht 5\' 11"  (1.803 m)  Wt 309 lb (140.161 kg)  BMI 43.10 kg/m2  SpO2 96%  Physical Exam  Constitutional: He is oriented to person, place, and time. He appears well-developed. No distress.  HENT:  Head: Normocephalic and atraumatic.  Eyes: Conjunctivae and EOM are normal. No scleral icterus.  Neck: Neck supple. No thyromegaly present.  Cardiovascular: Normal rate and regular rhythm.  Exam reveals no gallop and no friction rub.   No murmur heard. Pulmonary/Chest: No stridor. He has no wheezes. He has no rales. He exhibits  no tenderness.  Abdominal: He exhibits no distension. There is no tenderness (but positive right flank tenderness). There is no rebound.       obese  Genitourinary: Testes normal and penis normal. Swelling: absent.  Musculoskeletal: Normal range of motion. He exhibits no edema.       Negative straight leg raise  Lymphadenopathy:    He has no cervical adenopathy.  Neurological: He is oriented to person, place, and time. Coordination normal.  Skin: No rash noted. No erythema.  Psychiatric: He has a normal mood and affect. His behavior is normal.     OTHER DATA REVIEWED: Nursing notes, vital signs, and past medical records reviewed.   DIAGNOSTIC STUDIES: Oxygen Saturation is 97% on RA, normal by my interpretation at the bedside.  Orders placed during the hospital encounter of 11/23/10  . EKG 12-LEAD: Rate: 91, normal EKG, normal sinus rhythm, normal axis, no ST elevation or depression, no STEMI, unchanged from previous tracings.    LABS / RADIOLOGY:  Results for orders placed during the hospital encounter of 11/23/10  CBC      Component Value Range   WBC 13.1 (*) 4.0 - 10.5 (K/uL)   RBC 4.73  4.22 - 5.81 (MIL/uL)   Hemoglobin 15.0  13.0 - 17.0 (g/dL)   HCT 16.1  09.6 - 04.5 (%)   MCV 92.4  78.0 - 100.0 (fL)   MCH 31.7  26.0 - 34.0 (pg)   MCHC 34.3  30.0 - 36.0 (g/dL)   RDW 40.9  81.1 - 91.4 (%)   Platelets 190  150 - 400 (K/uL)  BASIC METABOLIC PANEL      Component Value Range   Sodium 133 (*) 135 - 145 (mEq/L)   Potassium 3.9  3.5 - 5.1 (mEq/L)   Chloride 99  96 - 112 (mEq/L)   CO2 20  19 - 32 (mEq/L)   Glucose, Bld 105 (*) 70 - 99 (mg/dL)   BUN 17  6 - 23 (mg/dL)   Creatinine, Ser 7.82  0.50 - 1.35 (mg/dL)   Calcium 9.1  8.4 - 95.6 (mg/dL)   GFR calc non Af Amer >60  >60 (mL/min)   GFR calc Af Amer >60  >60 (mL/min)  CARDIAC PANEL(CRET KIN+CKTOT+MB+TROPI)      Component Value Range   Total CK 269 (*) 7 - 232 (U/L)   CK, MB 4.8 (*) 0.3 - 4.0 (ng/mL)   Troponin I  <0.30  <0.30 (ng/mL)   Relative Index 1.8  0.0 - 2.5   DIFFERENTIAL      Component Value Range   Neutrophils Relative 85 (*) 43 - 77 (%)   Neutro Abs 10.9 (*) 1.7 - 7.7 (K/uL)   Lymphocytes Relative 7 (*) 12 - 46 (%)   Lymphs Abs 0.9  0.7 - 4.0 (K/uL)   Monocytes Relative 7  3 - 12 (%)   Monocytes Absolute 0.9  0.1 - 1.0 (K/uL)   Eosinophils Relative 1  0 - 5 (%)   Eosinophils Absolute 0.1  0.0 - 0.7 (K/uL)   Basophils Relative 0  0 - 1 (%)   Basophils Absolute 0.0  0.0 - 0.1 (K/uL)  URINALYSIS, ROUTINE W REFLEX MICROSCOPIC      Component Value Range   Color, Urine YELLOW  YELLOW    Appearance CLEAR  CLEAR    Specific Gravity, Urine 1.025  1.005 - 1.030    pH 6.0  5.0 - 8.0    Glucose, UA NEGATIVE  NEGATIVE (mg/dL)   Hgb urine dipstick NEGATIVE  NEGATIVE    Bilirubin Urine NEGATIVE  NEGATIVE    Ketones, ur NEGATIVE  NEGATIVE (mg/dL)   Protein, ur NEGATIVE  NEGATIVE (mg/dL)   Urobilinogen, UA 0.2  0.0 - 1.0 (mg/dL)   Nitrite NEGATIVE  NEGATIVE    Leukocytes, UA NEGATIVE  NEGATIVE    Ct Abdomen Pelvis Wo Contrast  11/23/2010  *RADIOLOGY REPORT*  Clinical Data: Shortness of breath  CT ABDOMEN AND PELVIS WITHOUT CONTRAST  Technique:  Multidetector CT imaging of the abdomen and pelvis was performed following the standard protocol without intravenous contrast.  Comparison: 05/24/2008  Findings: There is no pericardial or pleural effusion.  Multifocal nodular airspace opacities are identified within the left upper lobe.  There are also scattered nodular densities within basilar portion of the right upper lobe.  The patient is status post gastric banding.  Calcified granuloma noted within the spleen.  The adrenal glands are normal.  Multiple stones identified within the gallbladder lumen.  Within the right hepatic lobe there is a 9 mm hypodense structure, image 28.  This is too small to characterize.  Not seen on previous study.  Both adrenal glands are normal.  The pancreas is within normal  limits.  The left kidney appears normal.  No left-sided hydronephrosis or nephrolithiasis.  There is no right-sided nephrolithiasis or obstructive uropathy.   Borderline right external iliac lymph node measures 4 cm, image number 79.  No inguinal adenopathy.  The urinary bladder appears normal.  There is enlargement of the prostate gland which measures 6.8 x 6.5 x 5.5 cm.  No free fluid or fluid collections within the abdomen or pelvis.  The small bowel loops have a normal course and caliber.  The appendix is identified and appears normal.  The colon is unremarkable.  Review of the visualized osseous structures is significant for mild scoliosis and multilevel spondylosis.  The patient is status post stent graft repair of abdominal aortic aneurysm.  The appearance is unchanged from 05/24/2008.  The aneurysm sac measures 3.2 cm in maximum AP dimension.  This is unchanged from previous exam.  IMPRESSION:  1.  There is no evidence for nephrolithiasis or hydronephrosis. 2.  Gallstones. 3.  Status post gastric banding. 4.  Bilateral upper lobe airspace opacities worrisome for pneumonia.  Original Report Authenticated By: Rosealee Albee, M.D.   Dg Chest Portable 1 View  11/23/2010  *RADIOLOGY REPORT*  Clinical Data: Chest pain  PORTABLE CHEST - 1 VIEW  Comparison: 06/07/2008  Findings: Cardiac silhouette is enlarged with diffuse interstitial prominence versus mild edema.  No consolidation, pneumonia, effusion or pneumothorax.  Midline trachea.  IMPRESSION: Cardiomegaly with interstitial prominence versus mild edema.  Negative for pneumonia.  Original Report Authenticated By: Judie Petit. TREVOR SHICK, M.D.  ED COURSE / COORDINATION OF CARE: 16:45. Given IVNS, Dilaudid 1mg  IVP with Zofran 4mg  IVP 20:04. Patient is standing in room as he feels more comfortable in this position. Patient feels somewhat improved. Discussed test findings with patient at bedside along with treatment plan, including follow-up  instructions.    IMPRESSION: Diagnoses that have been ruled out:  Diagnoses that are still under consideration:  Final diagnoses:  Sciatica  Pneumonia, organism unspecified     PLAN: Discharge The patient is to return the emergency department if there is any worsening of symptoms. I have reviewed the discharge instructions with the patient and spouse.   CONDITION ON DISCHARGE: Stable   MEDICATIONS GIVEN IN THE E.D.  Medications  0.9 %  sodium chloride infusion (  Intravenous New Bag 11/23/10 1645)  HYDROmorphone (DILAUDID) injection 1 mg (1 mg Intravenous Given 11/23/10 1645)  ondansetron (ZOFRAN) injection 4 mg (4 mg Intravenous Given 11/23/10 1645)     DISCHARGE MEDICATIONS: New Prescriptions   No medications on file   I personally performed the services described in this documentation, which was scribed in my presence. The recorded information has been reviewed and considered. Benny Lennert, MD     Procedures none  Benny Lennert, MD 11/23/10 2014

## 2010-11-25 ENCOUNTER — Encounter: Payer: Self-pay | Admitting: Cardiology

## 2010-11-25 ENCOUNTER — Ambulatory Visit (INDEPENDENT_AMBULATORY_CARE_PROVIDER_SITE_OTHER): Payer: Medicare Other | Admitting: Cardiology

## 2010-11-25 DIAGNOSIS — M545 Low back pain: Secondary | ICD-10-CM

## 2010-11-25 DIAGNOSIS — F17201 Nicotine dependence, unspecified, in remission: Secondary | ICD-10-CM

## 2010-11-25 DIAGNOSIS — I714 Abdominal aortic aneurysm, without rupture: Secondary | ICD-10-CM

## 2010-11-25 DIAGNOSIS — G4730 Sleep apnea, unspecified: Secondary | ICD-10-CM

## 2010-11-25 DIAGNOSIS — I2510 Atherosclerotic heart disease of native coronary artery without angina pectoris: Secondary | ICD-10-CM

## 2010-11-25 DIAGNOSIS — Z87891 Personal history of nicotine dependence: Secondary | ICD-10-CM

## 2010-11-25 DIAGNOSIS — K219 Gastro-esophageal reflux disease without esophagitis: Secondary | ICD-10-CM

## 2010-11-25 DIAGNOSIS — C329 Malignant neoplasm of larynx, unspecified: Secondary | ICD-10-CM

## 2010-11-25 DIAGNOSIS — K5792 Diverticulitis of intestine, part unspecified, without perforation or abscess without bleeding: Secondary | ICD-10-CM

## 2010-11-25 DIAGNOSIS — K5732 Diverticulitis of large intestine without perforation or abscess without bleeding: Secondary | ICD-10-CM

## 2010-11-25 DIAGNOSIS — I10 Essential (primary) hypertension: Secondary | ICD-10-CM

## 2010-11-25 DIAGNOSIS — I679 Cerebrovascular disease, unspecified: Secondary | ICD-10-CM

## 2010-11-25 DIAGNOSIS — J189 Pneumonia, unspecified organism: Secondary | ICD-10-CM

## 2010-11-25 DIAGNOSIS — E669 Obesity, unspecified: Secondary | ICD-10-CM

## 2010-11-25 DIAGNOSIS — K8020 Calculus of gallbladder without cholecystitis without obstruction: Secondary | ICD-10-CM

## 2010-11-25 MED ORDER — DOXYCYCLINE HYCLATE 100 MG PO TABS: 100.0000 mg | ORAL_TABLET | Freq: Two times a day (BID) | ORAL | Status: AC

## 2010-11-25 NOTE — Assessment & Plan Note (Signed)
CT Scan done in the emergency room for his low back pain showed that his abdominal aortic aneurysm repair is stable

## 2010-11-25 NOTE — Assessment & Plan Note (Addendum)
Patient has history of coronary artery disease status post angioplasty of his PDA and second marginal in 2000. Patient has had some twinges of chest pain that are unchanged and have been going on for several years now. He had a routine stress test that was normal back in November 2011. Continue medical therapy.

## 2010-11-25 NOTE — Patient Instructions (Signed)
Your physician recommends that you schedule a follow-up appointment in: Your physician has recommended you make the following change in your medication:DOXYCYCLINE 100MG  TWICE DAILY CALLED INTO [facility] IN [place] ON THE $4 LIST

## 2010-11-25 NOTE — Assessment & Plan Note (Signed)
Patient's blood pressure is stable 

## 2010-11-25 NOTE — Progress Notes (Signed)
HPI: This is a 60 year old white male patient who is here for his yearly followup. He does have a history of coronary artery disease status post balloon angioplasty of the PDA and second marginal in March of 2000. He continues to have twinges of chest pain off and on that go away before he could even try to use a nitroglycerin. He did have a stress test back in November 2011 which was normal. He also has history of hypertension, sleep apnea, and laparoscopic gastric banding but has gained most of his weight back.  The patient's biggest complaint today is back pain which he was seen in the emergency room on Saturday for. He states he began having lower back pain worse on the right side radiating down his right thigh and his right knee. He says he's also had urinary urgency, dark-colored urine described as brown, yellow in appearance and a foul odor. He says since Saturday his urine has cleared in color but still has a foul odor. He was told he had pneumonia based on a CT scan that was done and was given an a prescription for Z-Pak and Percocet for the back pain. He has been unable to fill his medications because he doesn't have the money. He is also unable to see his primary care physician until August 3 because of the co-pay. The CT scan readout showed bilateral upper lobe air space opacities worrisome for pneumonia. The chest x-ray did not show this.  Patient complains of a productive cough that is yellow tinged sputum. He denies any fever or chills. He does complain of significant fatigue and says he can sleep 18 hours a day. His back pain has eased up somewhat but he still has an 8 in his lower back. He also says he has chronic dyspnea on exertion and on Saturday when he was having the severe pain he did become a bit dizzy and had some tingling in his face.  Allergies  Allergen Reactions  . Statins Hives and Rash    Current Outpatient Prescriptions on File Prior to Visit  Medication Sig Dispense  Refill  . aspirin 325 MG tablet Take 325 mg by mouth daily.        Marland Kitchen azithromycin (ZITHROMAX Z-PAK) 250 MG tablet Take 1 tablet (250 mg total) by mouth daily.  4 tablet  0  . b complex vitamins tablet Take 1-2 tablets by mouth daily.        . DULoxetine (CYMBALTA) 30 MG capsule Take 30 mg by mouth daily.       Marland Kitchen lisinopril (PRINIVIL,ZESTRIL) 40 MG tablet Take 40 mg by mouth daily.        . naproxen sodium (ANAPROX) 220 MG tablet Take 220 mg by mouth 2 (two) times daily with a meal.        . oxyCODONE-acetaminophen (PERCOCET) 5-325 MG per tablet Take 1 tablet by mouth every 6 (six) hours as needed for pain.  15 tablet  0    Past Medical History  Diagnosis Date  . Arteriosclerotic cardiovascular disease (ASCVD) 2000 he    PTCA of PDA and 2nd marginal in 3/00  . Hypertension     Mild; controlled with a single agent  . AAA (abdominal aortic aneurysm) 2007    stent graft repair 12/07  . Laryngeal carcinoma 2010    resection and radiation therapy 2010-11  . Diverticulitis 2006  . Obesity     BMI 47; lap band surgery 2010  . Gastroesophageal reflux disease  Hiatal hernia; previously treated with PPI  . Sleep apnea     BiPAP mask utilized   . Tobacco abuse, in remission     40 pack years; discontinued in 2000  . Cerebrovascular disease     carotid stent    Past Surgical History  Procedure Date  . Peripheral arterial stent graft 2007    for abdominal aortic aneurysm  . Larynx surgery 2010    Resection of carcinoma  . Open anterior shoulder reconstruction 2011    Left  . Laparoscopic gastric banding 2010  . Eye surgery   . Cataract extraction     Family History  Problem Relation Age of Onset  . Heart attack Father     deceased  . Diabetes Father   . Heart failure Father   . Hyperlipidemia Father   . Hypertension Father   . Lung cancer Mother     deceased   . Hypertension Mother   . Hyperlipidemia Mother   . Heart failure Mother   . Diabetes Mother   . Cancer  Sister     History   Social History  . Marital Status: Married    Spouse Name: N/A    Number of Children: N/A  . Years of Education: N/A   Occupational History  . Not on file.   Social History Main Topics  . Smoking status: Former Smoker    Quit date: 11/26/1997  . Smokeless tobacco: Never Used   Comment: 40 pack years; quit 2000  . Alcohol Use: No  . Drug Use: No  . Sexually Active: Yes   Other Topics Concern  . Not on file   Social History Narrative   Married, past employed, does not get regular exercise.     ROS: See HPI Eyes: Negative Ears:Negative for hearing loss, tinnitusdyspn Cardiovascular: Negative for palpitations,irregular heartbeat,  near-syncope, orthopnea, paroxysmal nocturnal dyspnia and syncope,edema, claudication, cyanosis,.  Respiratory:   Negative for hemoptysis, sleep disturbances due to breathing, and wheezing.   Endocrine: Negative for cold intolerance and heat intolerance.  Hematologic/Lymphatic: Negative for adenopathy and bleeding problem. Does not bruise/bleed easily.  Musculoskeletal: significant lower back pain.   Gastrointestinal: Negative for nausea, vomiting, reflux, abdominal pain, diarrhea, constipation.   Genitourinary: Negative for bladder incontinence, dysuria, hematuria, hesitancy, nocturia.  Neurological: Negative.  Allergic/Immunologic: Negative for environmental allergies.   PHYSICAL EXAM Obese, in no acute distress. Neck: No JVD, HJR, Bruit, or thyroid enlargement Lungs: Decreased breath sounds,No tachypnea, clear without wheezing, rales, or rhonchi Cardiovascular: RRR, PMI not displaced, heart sounds normal, no murmurs, gallops, bruit, thrill, or heave. Abdomen: BS normal. Soft without organomegaly, masses, lesions or tenderness. Extremities: without cyanosis, clubbing or edema. Good distal pulses bilateral SKin: Warm, no lesions or rashes  Musculoskeletal: No deformities Neuro: no focal signs  BP 117/70  Pulse 70  Ht  5\' 11"  (1.803 m)  Wt 314 lb (142.429 kg)  BMI 43.79 kg/m2  SpO2 96%  CT Scan: 11/23/2010:  The patient is status post stent graft repair of abdominal aortic aneurysm. The appearance is unchanged from 05/24/2008. The aneurysm sac measures 3.2 cm in maximum AP dimension. This is unchanged from previous exam.  IMPRESSION:  1. There is no evidence for nephrolithiasis or hydronephrosis. 2. Gallstones. 3. Status post gastric banding. 4. Bilateral upper lobe airspace opacities worrisome for pneumonia.

## 2010-11-25 NOTE — Assessment & Plan Note (Addendum)
Patient had significant low back pain on Saturday associated with radiation down his right leg as well as dark-colored urine with a foul odor. Patient thinks he probably has some type of urinary or kidney problem although he will not see his primary care physician for this because of inability to pain the co-pay. Urinalysis was normal in the emergency room and CT did not show anything wrong with his kidney.Follow up with Dr. Juanetta Gosling.

## 2010-11-28 ENCOUNTER — Encounter: Payer: Self-pay | Admitting: Cardiology

## 2010-11-28 DIAGNOSIS — K8020 Calculus of gallbladder without cholecystitis without obstruction: Secondary | ICD-10-CM | POA: Insufficient documentation

## 2010-11-28 NOTE — Assessment & Plan Note (Signed)
CT scan shows possible pneumonia, but he has no fever, rigors, leucocytosis, cough or sputum production.  Rx for doxycycline given and money to allow him to obtain if after all other avenues for medication procurement were exhausted.  He has F/U appt with Dr. Juanetta Gosling in a few days.

## 2010-12-02 ENCOUNTER — Encounter: Payer: Self-pay | Admitting: Cardiology

## 2010-12-02 DIAGNOSIS — R918 Other nonspecific abnormal finding of lung field: Secondary | ICD-10-CM | POA: Insufficient documentation

## 2010-12-18 ENCOUNTER — Other Ambulatory Visit (HOSPITAL_COMMUNITY): Payer: Self-pay | Admitting: Pulmonary Disease

## 2010-12-18 DIAGNOSIS — M5410 Radiculopathy, site unspecified: Secondary | ICD-10-CM

## 2010-12-18 DIAGNOSIS — M545 Low back pain, unspecified: Secondary | ICD-10-CM

## 2010-12-23 ENCOUNTER — Ambulatory Visit (HOSPITAL_COMMUNITY)
Admission: RE | Admit: 2010-12-23 | Discharge: 2010-12-23 | Disposition: A | Discharge status: Home / Self Care | Payer: Medicare Other | Source: Ambulatory Visit | Attending: Pulmonary Disease | Admitting: Pulmonary Disease

## 2010-12-23 DIAGNOSIS — M51379 Other intervertebral disc degeneration, lumbosacral region without mention of lumbar back pain or lower extremity pain: Secondary | ICD-10-CM | POA: Insufficient documentation

## 2010-12-23 DIAGNOSIS — M5137 Other intervertebral disc degeneration, lumbosacral region: Secondary | ICD-10-CM | POA: Insufficient documentation

## 2010-12-23 DIAGNOSIS — M545 Low back pain, unspecified: Secondary | ICD-10-CM | POA: Insufficient documentation

## 2010-12-23 DIAGNOSIS — M5410 Radiculopathy, site unspecified: Secondary | ICD-10-CM

## 2011-02-20 ENCOUNTER — Encounter: Payer: Self-pay | Admitting: Cardiology

## 2011-04-18 ENCOUNTER — Inpatient Hospital Stay (HOSPITAL_COMMUNITY): Payer: Medicare Other | Admitting: Certified Registered"

## 2011-04-18 ENCOUNTER — Other Ambulatory Visit: Payer: Self-pay

## 2011-04-18 ENCOUNTER — Emergency Department (HOSPITAL_COMMUNITY): Payer: Medicare Other

## 2011-04-18 ENCOUNTER — Other Ambulatory Visit: Payer: Self-pay | Admitting: Surgery

## 2011-04-18 ENCOUNTER — Inpatient Hospital Stay (HOSPITAL_COMMUNITY)
Admission: EM | Admit: 2011-04-18 | Discharge: 2011-04-21 | DRG: 253 | Disposition: A | Discharge status: Home / Self Care | Payer: Medicare Other | Attending: Surgery | Admitting: Surgery

## 2011-04-18 ENCOUNTER — Inpatient Hospital Stay (HOSPITAL_COMMUNITY): Payer: Medicare Other

## 2011-04-18 ENCOUNTER — Encounter (HOSPITAL_COMMUNITY): Payer: Self-pay

## 2011-04-18 ENCOUNTER — Encounter (HOSPITAL_COMMUNITY): Payer: Self-pay | Admitting: Certified Registered"

## 2011-04-18 ENCOUNTER — Encounter (HOSPITAL_COMMUNITY)
Admission: EM | Disposition: A | Discharge status: Home / Self Care | Payer: Self-pay | Source: Home / Self Care | Attending: Surgery

## 2011-04-18 DIAGNOSIS — Z888 Allergy status to other drugs, medicaments and biological substances status: Secondary | ICD-10-CM

## 2011-04-18 DIAGNOSIS — Z7982 Long term (current) use of aspirin: Secondary | ICD-10-CM

## 2011-04-18 DIAGNOSIS — I70219 Atherosclerosis of native arteries of extremities with intermittent claudication, unspecified extremity: Secondary | ICD-10-CM

## 2011-04-18 DIAGNOSIS — G4730 Sleep apnea, unspecified: Secondary | ICD-10-CM | POA: Diagnosis present

## 2011-04-18 DIAGNOSIS — Y832 Surgical operation with anastomosis, bypass or graft as the cause of abnormal reaction of the patient, or of later complication, without mention of misadventure at the time of the procedure: Secondary | ICD-10-CM | POA: Diagnosis present

## 2011-04-18 DIAGNOSIS — R339 Retention of urine, unspecified: Secondary | ICD-10-CM | POA: Diagnosis not present

## 2011-04-18 DIAGNOSIS — Z79899 Other long term (current) drug therapy: Secondary | ICD-10-CM

## 2011-04-18 DIAGNOSIS — Z48812 Encounter for surgical aftercare following surgery on the circulatory system: Secondary | ICD-10-CM

## 2011-04-18 DIAGNOSIS — J342 Deviated nasal septum: Secondary | ICD-10-CM | POA: Diagnosis present

## 2011-04-18 DIAGNOSIS — M79609 Pain in unspecified limb: Secondary | ICD-10-CM

## 2011-04-18 DIAGNOSIS — I10 Essential (primary) hypertension: Secondary | ICD-10-CM | POA: Diagnosis present

## 2011-04-18 DIAGNOSIS — N138 Other obstructive and reflux uropathy: Secondary | ICD-10-CM | POA: Diagnosis present

## 2011-04-18 DIAGNOSIS — I724 Aneurysm of artery of lower extremity: Secondary | ICD-10-CM | POA: Diagnosis present

## 2011-04-18 DIAGNOSIS — Z9849 Cataract extraction status, unspecified eye: Secondary | ICD-10-CM

## 2011-04-18 DIAGNOSIS — Z9861 Coronary angioplasty status: Secondary | ICD-10-CM

## 2011-04-18 DIAGNOSIS — T82898A Other specified complication of vascular prosthetic devices, implants and grafts, initial encounter: Secondary | ICD-10-CM | POA: Diagnosis present

## 2011-04-18 DIAGNOSIS — Z87891 Personal history of nicotine dependence: Secondary | ICD-10-CM

## 2011-04-18 DIAGNOSIS — Z8521 Personal history of malignant neoplasm of larynx: Secondary | ICD-10-CM

## 2011-04-18 DIAGNOSIS — I2510 Atherosclerotic heart disease of native coronary artery without angina pectoris: Secondary | ICD-10-CM | POA: Diagnosis present

## 2011-04-18 DIAGNOSIS — I743 Embolism and thrombosis of arteries of the lower extremities: Secondary | ICD-10-CM

## 2011-04-18 DIAGNOSIS — I7090 Unspecified atherosclerosis: Secondary | ICD-10-CM

## 2011-04-18 DIAGNOSIS — I82890 Acute embolism and thrombosis of other specified veins: Principal | ICD-10-CM | POA: Diagnosis present

## 2011-04-18 DIAGNOSIS — N401 Enlarged prostate with lower urinary tract symptoms: Secondary | ICD-10-CM | POA: Diagnosis present

## 2011-04-18 DIAGNOSIS — K219 Gastro-esophageal reflux disease without esophagitis: Secondary | ICD-10-CM | POA: Diagnosis present

## 2011-04-18 HISTORY — PX: FEMORAL-FEMORAL BYPASS GRAFT: SHX936

## 2011-04-18 HISTORY — PX: FEMORAL-POPLITEAL BYPASS GRAFT: SHX937

## 2011-04-18 LAB — TYPE AND SCREEN

## 2011-04-18 LAB — BASIC METABOLIC PANEL
Calcium: 9.3 mg/dL (ref 8.4–10.5)
GFR calc Af Amer: >90 mL/min (ref >90)
GFR calc non Af Amer: >90 mL/min (ref >90)
Glucose, Bld: 108 mg/dL — ABNORMAL HIGH (ref 70–99)
Sodium: 135 mEq/L (ref 135–145)

## 2011-04-18 LAB — CBC
MCH: 31.9 pg (ref 26.0–34.0)
MCHC: 34.4 g/dL (ref 30.0–36.0)
MCV: 92.6 fL (ref 78.0–100.0)
MCV: 95.9 fL (ref 78.0–100.0)
Platelets: 157 10*3/uL (ref 150–400)
Platelets: 122 10*3/uL — ABNORMAL LOW (ref 150–400)
RBC: 5.02 MIL/uL (ref 4.22–5.81)
RBC: 3.92 MIL/uL — ABNORMAL LOW (ref 4.22–5.81)
RDW: 14.1 % (ref 11.5–15.5)
RDW: 14.3 % (ref 11.5–15.5)
WBC: 14.4 10*3/uL — ABNORMAL HIGH (ref 4.0–10.5)

## 2011-04-18 LAB — POCT I-STAT 4, (NA,K, GLUC, HGB,HCT)
Glucose, Bld: 132 mg/dL — ABNORMAL HIGH (ref 70–99)
HCT: 38 % — ABNORMAL LOW (ref 39.0–52.0)
Sodium: 136 mEq/L (ref 135–145)

## 2011-04-18 LAB — DIFFERENTIAL
Basophils Relative: 0 % (ref 0–1)
Eosinophils Absolute: 0.1 10*3/uL (ref 0.0–0.7)
Eosinophils Relative: 2 % (ref 0–5)
Lymphs Abs: 1.5 10*3/uL (ref 0.7–4.0)

## 2011-04-18 LAB — POCT I-STAT, CHEM 8
BUN: 18 mg/dL (ref 6–23)
Calcium, Ion: 1.17 mmol/L (ref 1.12–1.32)
Chloride: 105 mEq/L (ref 96–112)
Creatinine, Ser: 0.9 mg/dL (ref 0.50–1.35)

## 2011-04-18 LAB — PROTIME-INR: INR: 1.03 (ref 0.00–1.49)

## 2011-04-18 LAB — ABO/RH: ABO/RH(D): O NEG

## 2011-04-18 SURGERY — CREATION, BYPASS, ARTERIAL, FEMORAL TO FEMORAL, USING GRAFT
Anesthesia: General | Site: Leg Upper | Laterality: Right | Wound class: Clean

## 2011-04-18 MED ORDER — DOCUSATE SODIUM 100 MG PO CAPS
100.0000 mg | ORAL_CAPSULE | Freq: Every day | ORAL | Status: DC
  Administered 2011-04-19 – 2011-04-21 (×3): 100 mg via ORAL
  Filled 2011-04-18 (×3): qty 1

## 2011-04-18 MED ORDER — PANTOPRAZOLE SODIUM 40 MG PO TBEC
40.0000 mg | DELAYED_RELEASE_TABLET | Freq: Every day | ORAL | Status: DC
  Administered 2011-04-20 – 2011-04-21 (×2): 40 mg via ORAL
  Filled 2011-04-18 (×3): qty 1

## 2011-04-18 MED ORDER — POTASSIUM CHLORIDE CRYS ER 20 MEQ PO TBCR
20.0000 meq | EXTENDED_RELEASE_TABLET | Freq: Two times a day (BID) | ORAL | Status: DC
  Administered 2011-04-18 – 2011-04-21 (×6): 20 meq via ORAL
  Filled 2011-04-18 (×9): qty 1

## 2011-04-18 MED ORDER — OXYCODONE-ACETAMINOPHEN 5-325 MG PO TABS
1.0000 | ORAL_TABLET | ORAL | Status: DC | PRN
  Administered 2011-04-19 (×3): 2 via ORAL
  Administered 2011-04-19: 1 via ORAL
  Administered 2011-04-20 – 2011-04-21 (×4): 2 via ORAL
  Filled 2011-04-18: qty 2
  Filled 2011-04-18 (×2): qty 1
  Filled 2011-04-18 (×6): qty 2

## 2011-04-18 MED ORDER — ONDANSETRON HCL 4 MG/2ML IJ SOLN
INTRAMUSCULAR | Status: DC | PRN
  Administered 2011-04-18: 4 mg via INTRAVENOUS

## 2011-04-18 MED ORDER — LACTATED RINGERS IV SOLN
INTRAVENOUS | Status: DC | PRN
  Administered 2011-04-18 (×3): via INTRAVENOUS

## 2011-04-18 MED ORDER — HYDRALAZINE HCL 20 MG/ML IJ SOLN
10.0000 mg | INTRAMUSCULAR | Status: DC | PRN
  Filled 2011-04-18: qty 0.5

## 2011-04-18 MED ORDER — MORPHINE SULFATE 10 MG/ML IJ SOLN
INTRAMUSCULAR | Status: DC | PRN
  Administered 2011-04-18 (×2): 5 mg via INTRAVENOUS

## 2011-04-18 MED ORDER — LABETALOL HCL 5 MG/ML IV SOLN
10.0000 mg | INTRAVENOUS | Status: DC | PRN
  Filled 2011-04-18: qty 4

## 2011-04-18 MED ORDER — ACETAMINOPHEN 650 MG RE SUPP: 325.0000 mg | RECTAL | Status: DC | PRN

## 2011-04-18 MED ORDER — HEPARIN (PORCINE) IN NACL 100-0.45 UNIT/ML-% IJ SOLN
1000.0000 [IU]/h | INTRAMUSCULAR | Status: DC
  Filled 2011-04-18: qty 250

## 2011-04-18 MED ORDER — GLYCOPYRROLATE 0.2 MG/ML IJ SOLN
INTRAMUSCULAR | Status: DC | PRN
  Administered 2011-04-18: .4 mg via INTRAVENOUS

## 2011-04-18 MED ORDER — POTASSIUM CHLORIDE CRYS ER 20 MEQ PO TBCR: 20.0000 meq | EXTENDED_RELEASE_TABLET | Freq: Once | ORAL | Status: AC | PRN

## 2011-04-18 MED ORDER — METOCLOPRAMIDE HCL 5 MG/ML IJ SOLN
10.0000 mg | Freq: Once | INTRAMUSCULAR | Status: DC | PRN
  Filled 2011-04-18: qty 2

## 2011-04-18 MED ORDER — DEXAMETHASONE SODIUM PHOSPHATE 4 MG/ML IJ SOLN
INTRAMUSCULAR | Status: DC | PRN
  Administered 2011-04-18: 8 mg via INTRAVENOUS

## 2011-04-18 MED ORDER — SUCCINYLCHOLINE CHLORIDE 20 MG/ML IJ SOLN
INTRAMUSCULAR | Status: DC | PRN
  Administered 2011-04-18: 100 mg via INTRAVENOUS

## 2011-04-18 MED ORDER — PHENYLEPHRINE HCL 10 MG/ML IJ SOLN
INTRAMUSCULAR | Status: DC | PRN
  Administered 2011-04-18 (×6): 80 ug via INTRAVENOUS

## 2011-04-18 MED ORDER — SODIUM CHLORIDE 0.9 % IR SOLN
Status: DC | PRN
  Administered 2011-04-18 (×2): 1000 mL

## 2011-04-18 MED ORDER — FENTANYL CITRATE 0.05 MG/ML IJ SOLN: 25.0000 ug | INTRAMUSCULAR | Status: DC | PRN

## 2011-04-18 MED ORDER — PHENOL 1.4 % MT LIQD
1.0000 | OROMUCOSAL | Status: DC | PRN
  Administered 2011-04-18: 1 via OROMUCOSAL
  Filled 2011-04-18: qty 177

## 2011-04-18 MED ORDER — IOHEXOL 350 MG/ML SOLN
100.0000 mL | Freq: Once | INTRAVENOUS | Status: AC | PRN
  Administered 2011-04-18: 100 mL via INTRAVENOUS

## 2011-04-18 MED ORDER — HETASTARCH-ELECTROLYTES 6 % IV SOLN
INTRAVENOUS | Status: DC | PRN
  Administered 2011-04-18: 10:00:00 via INTRAVENOUS

## 2011-04-18 MED ORDER — MORPHINE SULFATE 2 MG/ML IJ SOLN: 0.0500 mg/kg | INTRAMUSCULAR | Status: DC | PRN

## 2011-04-18 MED ORDER — HEPARIN (PORCINE) IN NACL 100-0.45 UNIT/ML-% IJ SOLN
500.0000 [IU]/h | INTRAMUSCULAR | Status: DC
  Administered 2011-04-18: 500 [IU]/h via INTRAVENOUS
  Filled 2011-04-18 (×3): qty 250

## 2011-04-18 MED ORDER — LISINOPRIL 40 MG PO TABS
40.0000 mg | ORAL_TABLET | Freq: Every day | ORAL | Status: DC
  Administered 2011-04-19 – 2011-04-21 (×3): 40 mg via ORAL
  Filled 2011-04-18 (×4): qty 1

## 2011-04-18 MED ORDER — HEMOSTATIC AGENTS (NO CHARGE) OPTIME
TOPICAL | Status: DC | PRN
  Administered 2011-04-18 (×3): 1 via TOPICAL

## 2011-04-18 MED ORDER — PROTAMINE SULFATE 10 MG/ML IV SOLN
INTRAVENOUS | Status: DC | PRN
  Administered 2011-04-18: 50 mg via INTRAVENOUS

## 2011-04-18 MED ORDER — ENOXAPARIN SODIUM 40 MG/0.4ML ~~LOC~~ SOLN: 40.0000 mg | SUBCUTANEOUS | Status: DC

## 2011-04-18 MED ORDER — HEPARIN SOD (PORCINE) IN D5W 100 UNIT/ML IV SOLN
1000.0000 [IU]/h | INTRAVENOUS | Status: DC
  Administered 2011-04-18: 1000 [IU]/h via INTRAVENOUS
  Filled 2011-04-18: qty 250

## 2011-04-18 MED ORDER — GUAIFENESIN-DM 100-10 MG/5ML PO SYRP: 15.0000 mL | ORAL_SOLUTION | ORAL | Status: DC | PRN

## 2011-04-18 MED ORDER — CEFAZOLIN SODIUM 1-5 GM-% IV SOLN
INTRAVENOUS | Status: AC
  Filled 2011-04-18: qty 50

## 2011-04-18 MED ORDER — METOPROLOL TARTRATE 1 MG/ML IV SOLN: 2.0000 mg | INTRAVENOUS | Status: DC | PRN

## 2011-04-18 MED ORDER — MORPHINE SULFATE 2 MG/ML IJ SOLN: 2.0000 mg | INTRAMUSCULAR | Status: DC | PRN

## 2011-04-18 MED ORDER — FAMOTIDINE IN NACL 20-0.9 MG/50ML-% IV SOLN
20.0000 mg | Freq: Two times a day (BID) | INTRAVENOUS | Status: DC
  Administered 2011-04-18 – 2011-04-19 (×3): 20 mg via INTRAVENOUS
  Filled 2011-04-18 (×7): qty 50

## 2011-04-18 MED ORDER — MAGNESIUM SULFATE 40 MG/ML IJ SOLN
2.0000 g | Freq: Once | INTRAMUSCULAR | Status: AC | PRN
  Filled 2011-04-18: qty 50

## 2011-04-18 MED ORDER — ONDANSETRON HCL 4 MG/2ML IJ SOLN: 4.0000 mg | Freq: Four times a day (QID) | INTRAMUSCULAR | Status: DC | PRN

## 2011-04-18 MED ORDER — DULOXETINE HCL 30 MG PO CPEP
30.0000 mg | ORAL_CAPSULE | Freq: Every day | ORAL | Status: DC
  Administered 2011-04-20: 30 mg via ORAL
  Filled 2011-04-18 (×3): qty 1

## 2011-04-18 MED ORDER — SODIUM CHLORIDE 0.9 % IV SOLN
INTRAVENOUS | Status: DC
  Administered 2011-04-18 – 2011-04-20 (×4): via INTRAVENOUS

## 2011-04-18 MED ORDER — PAPAVERINE HCL 30 MG/ML IJ SOLN
INTRAMUSCULAR | Status: DC | PRN
  Administered 2011-04-18: 30 mg via INTRAVENOUS

## 2011-04-18 MED ORDER — FENTANYL CITRATE 0.05 MG/ML IJ SOLN
INTRAMUSCULAR | Status: DC | PRN
  Administered 2011-04-18: 50 ug via INTRAVENOUS
  Administered 2011-04-18: 100 ug via INTRAVENOUS
  Administered 2011-04-18 (×2): 50 ug via INTRAVENOUS

## 2011-04-18 MED ORDER — HEPARIN SODIUM (PORCINE) 1000 UNIT/ML IJ SOLN
INTRAMUSCULAR | Status: DC | PRN
  Administered 2011-04-18: 6000 [IU] via INTRAVENOUS
  Administered 2011-04-18: 5000 [IU] via INTRAVENOUS
  Administered 2011-04-18 (×2): 3000 [IU] via INTRAVENOUS

## 2011-04-18 MED ORDER — SODIUM CHLORIDE 0.9 % IR SOLN
Status: DC | PRN
  Administered 2011-04-18: 07:00:00

## 2011-04-18 MED ORDER — DOPAMINE-DEXTROSE 3.2-5 MG/ML-% IV SOLN
3.0000 ug/kg/min | INTRAVENOUS | Status: DC
  Filled 2011-04-18: qty 250

## 2011-04-18 MED ORDER — CEFAZOLIN SODIUM 1-5 GM-% IV SOLN
INTRAVENOUS | Status: DC | PRN
  Administered 2011-04-18 (×3): 1 g via INTRAVENOUS

## 2011-04-18 MED ORDER — IOHEXOL 300 MG/ML  SOLN
INTRAMUSCULAR | Status: DC | PRN
  Administered 2011-04-18: 30 mL via INTRAVENOUS

## 2011-04-18 MED ORDER — DOPAMINE-DEXTROSE 3.2-5 MG/ML-% IV SOLN: 3.0000 ug/kg/min | INTRAVENOUS | Status: DC

## 2011-04-18 MED ORDER — SODIUM CHLORIDE 0.9 % IV SOLN: 500.0000 mL | Freq: Once | INTRAVENOUS | Status: AC | PRN

## 2011-04-18 MED ORDER — ACETAMINOPHEN 325 MG PO TABS: 325.0000 mg | ORAL_TABLET | ORAL | Status: DC | PRN

## 2011-04-18 MED ORDER — ETOMIDATE 2 MG/ML IV SOLN
INTRAVENOUS | Status: DC | PRN
  Administered 2011-04-18: 20 mg via INTRAVENOUS

## 2011-04-18 MED ORDER — VECURONIUM BROMIDE 10 MG IV SOLR
INTRAVENOUS | Status: DC | PRN
  Administered 2011-04-18: 3 mg via INTRAVENOUS
  Administered 2011-04-18: 2 mg via INTRAVENOUS
  Administered 2011-04-18: 3 mg via INTRAVENOUS
  Administered 2011-04-18: 2 mg via INTRAVENOUS
  Administered 2011-04-18 (×2): 3 mg via INTRAVENOUS
  Administered 2011-04-18 (×2): 2 mg via INTRAVENOUS

## 2011-04-18 MED ORDER — CEFUROXIME SODIUM 1.5 G IJ SOLR
1.5000 g | Freq: Two times a day (BID) | INTRAMUSCULAR | Status: AC
  Administered 2011-04-18 (×2): 1.5 g via INTRAVENOUS
  Filled 2011-04-18 (×4): qty 1.5

## 2011-04-18 MED ORDER — NEOSTIGMINE METHYLSULFATE 1 MG/ML IJ SOLN
INTRAMUSCULAR | Status: DC | PRN
  Administered 2011-04-18: 3 mg via INTRAVENOUS

## 2011-04-18 MED ORDER — ROCURONIUM BROMIDE 100 MG/10ML IV SOLN
INTRAVENOUS | Status: DC | PRN
  Administered 2011-04-18: 20 mg via INTRAVENOUS
  Administered 2011-04-18: 50 mg via INTRAVENOUS

## 2011-04-18 SURGICAL SUPPLY — 72 items
ADH SKN CLS APL DERMABOND .7 (GAUZE/BANDAGES/DRESSINGS) ×10
ADH SKN CLS LQ APL DERMABOND (GAUZE/BANDAGES/DRESSINGS) ×2
BANDAGE ESMARK 6X9 LF (GAUZE/BANDAGES/DRESSINGS) IMPLANT
BNDG CMPR 9X6 STRL LF SNTH (GAUZE/BANDAGES/DRESSINGS) ×2
BNDG ESMARK 6X9 LF (GAUZE/BANDAGES/DRESSINGS) ×3
CANISTER SUCTION 2500CC (MISCELLANEOUS) ×3 IMPLANT
CATH EMB 3FR 80CM (CATHETERS) ×1 IMPLANT
CATH EMB 5FR 80CM (CATHETERS) ×1 IMPLANT
CLIP TI MEDIUM 24 (CLIP) ×3 IMPLANT
CLIP TI WIDE RED SMALL 24 (CLIP) ×3 IMPLANT
CLOTH BEACON ORANGE TIMEOUT ST (SAFETY) ×3 IMPLANT
CONT SPEC STER OR (MISCELLANEOUS) ×1 IMPLANT
COVER PROBE W GEL 5X96 (DRAPES) ×1 IMPLANT
COVER SURGICAL LIGHT HANDLE (MISCELLANEOUS) ×6 IMPLANT
CUFF TOURNIQUET SINGLE 44IN (TOURNIQUET CUFF) ×1 IMPLANT
DERMABOND ADHESIVE PROPEN (GAUZE/BANDAGES/DRESSINGS) ×1
DERMABOND ADVANCED (GAUZE/BANDAGES/DRESSINGS) ×5
DERMABOND ADVANCED .7 DNX12 (GAUZE/BANDAGES/DRESSINGS) ×2 IMPLANT
DERMABOND ADVANCED .7 DNX6 (GAUZE/BANDAGES/DRESSINGS) IMPLANT
DRAIN CHANNEL 15F RND FF W/TCR (WOUND CARE) IMPLANT
DRAPE WARM FLUID 44X44 (DRAPE) ×3 IMPLANT
DRAPE X-RAY CASS 24X20 (DRAPES) ×2 IMPLANT
ELECT REM PT RETURN 9FT ADLT (ELECTROSURGICAL) ×3
ELECTRODE REM PT RTRN 9FT ADLT (ELECTROSURGICAL) ×2 IMPLANT
EVACUATOR SILICONE 100CC (DRAIN) IMPLANT
GLOVE BIO SURGEON STRL SZ 6.5 (GLOVE) ×2 IMPLANT
GLOVE BIO SURGEON STRL SZ7 (GLOVE) ×2 IMPLANT
GLOVE BIOGEL PI IND STRL 6.5 (GLOVE) IMPLANT
GLOVE BIOGEL PI IND STRL 7.0 (GLOVE) IMPLANT
GLOVE BIOGEL PI IND STRL 7.5 (GLOVE) ×2 IMPLANT
GLOVE BIOGEL PI INDICATOR 6.5 (GLOVE) ×1
GLOVE BIOGEL PI INDICATOR 7.0 (GLOVE) ×4
GLOVE BIOGEL PI INDICATOR 7.5 (GLOVE) ×2
GLOVE ORTHOPEDIC STR SZ6.5 (GLOVE) ×1 IMPLANT
GLOVE SURG SS PI 7.5 STRL IVOR (GLOVE) ×4 IMPLANT
GOWN PREVENTION PLUS XLARGE (GOWN DISPOSABLE) ×2 IMPLANT
GOWN PREVENTION PLUS XXLARGE (GOWN DISPOSABLE) ×4 IMPLANT
GOWN STRL NON-REIN LRG LVL3 (GOWN DISPOSABLE) ×8 IMPLANT
GRAFT HEMASHIELD 8MM (Vascular Products) ×3 IMPLANT
GRAFT PROPATEN W/RING 8X80X70 (Vascular Products) ×1 IMPLANT
GRAFT VASC STRG 30X8KNIT (Vascular Products) IMPLANT
HEMOSTAT SNOW SURGICEL 2X4 (HEMOSTASIS) ×3 IMPLANT
HEMOSTAT SURGICEL 2X14 (HEMOSTASIS) IMPLANT
KIT BASIN OR (CUSTOM PROCEDURE TRAY) ×3 IMPLANT
KIT ROOM TURNOVER OR (KITS) ×3 IMPLANT
MARKER SKIN DUAL TIP RULER LAB (MISCELLANEOUS) ×1 IMPLANT
NS IRRIG 1000ML POUR BTL (IV SOLUTION) ×7 IMPLANT
PACK PERIPHERAL VASCULAR (CUSTOM PROCEDURE TRAY) ×3 IMPLANT
PAD ARMBOARD 7.5X6 YLW CONV (MISCELLANEOUS) ×6 IMPLANT
SET COLLECT BLD 21X3/4 12 (NEEDLE) ×1 IMPLANT
SPONGE LAP 18X18 X RAY DECT (DISPOSABLE) ×5 IMPLANT
STAPLER VISISTAT 35W (STAPLE) IMPLANT
STOPCOCK 4 WAY LG BORE MALE ST (IV SETS) ×1 IMPLANT
STOPCOCK MORSE 400PSI 3WAY (MISCELLANEOUS) ×1 IMPLANT
SUT ETHIBOND 5 LR DA (SUTURE) ×2 IMPLANT
SUT GORETEX 6.0 TT13 (SUTURE) ×3 IMPLANT
SUT PROLENE 5 0 C 1 24 (SUTURE) ×15 IMPLANT
SUT PROLENE 6 0 BV (SUTURE) ×3 IMPLANT
SUT SILK 2 0 SH (SUTURE) ×2 IMPLANT
SUT SILK 3 0 (SUTURE) ×3
SUT SILK 3-0 18XBRD TIE 12 (SUTURE) IMPLANT
SUT VIC AB 2-0 CT1 27 (SUTURE) ×15
SUT VIC AB 2-0 CT1 TAPERPNT 27 (SUTURE) ×4 IMPLANT
SUT VIC AB 3-0 SH 27 (SUTURE) ×12
SUT VIC AB 3-0 SH 27X BRD (SUTURE) ×4 IMPLANT
SUT VICRYL 4-0 PS2 18IN ABS (SUTURE) ×1 IMPLANT
SYR TB 1ML LUER SLIP (SYRINGE) ×1 IMPLANT
TOWEL OR 17X24 6PK STRL BLUE (TOWEL DISPOSABLE) ×9 IMPLANT
TOWEL OR 17X26 10 PK STRL BLUE (TOWEL DISPOSABLE) ×3 IMPLANT
TRAY FOLEY CATH 14FRSI W/METER (CATHETERS) ×3 IMPLANT
TUBING EXTENTION W/L.L. (IV SETS) ×1 IMPLANT
WATER STERILE IRR 1000ML POUR (IV SOLUTION) ×3 IMPLANT

## 2011-04-18 NOTE — Op Note (Signed)
[facility] [place]  Patient name: Mathew Padilla MRN: [id number] DOB: July 08, 1950 Sex: male  04/18/2011 Pre-operative Diagnosis: Ischemic right leg Post-operative diagnosis:  Same Surgeon:  Jorge Ny Procedure:   #1: Redo bilateral common femoral artery exposure #2 left-to-right femoral-femoral bypass graft with 8 mm dacryon #3 right femoral to below knee popliteal artery bypass graft with 8 mm Propatent for ligation popliteal aneurysm #5 intraoperative angiogram x2 #6 thrombectomy right anterior tibial and posterior tibial arteries Anesthesia:  Gen. Blood Loss:  See anesthesia record Specimens:  Popliteal thrombus  Findings:  Femoral-femoral graft was sewn to both common femoral arteries. The right common femoral artery comes off the proximal superficial femoral artery just below the fem-fem bypass  Indications:  The patient presented to the emergency department with an ischemic right leg. He complained of numbness and decreased sensation and motor function. CT angiogram revealed that he had occluded the right limb of his aortic stent graft. In addition he had a thrombosed right popliteal aneurysm I discussed these findings with the patient. I recommended emergent operation to restore blood flow. He understands that this is a limb threatening situation. I also discussed this with the wife.  Procedure:  The patient was identified in the holding area and taken to [facility] OR ROOM 06  The patient was then placed supine on the table. general anesthesia was administered.  The patient was prepped and draped in the usual sterile fashion.  A time out was called and antibiotics were administered.  The patient's previous oblique incisions in the groin were opened sharply. Cautery and sharp dissection were used to dissect down to the common femoral arteries bilaterally. Patient had fairly dense scar tissue in both groins. Ultimately I dissected free the common femoral artery and  profunda femoral and superficial femoral arteries bilaterally. Both femoral arteries had significant calcification and posterior plaque. There was no pulse in the right groin. I then created a tunnel between the 2 groins anterior to the rectus fascia. An 8 mm Gore-Tex graft was brought through the tunnel. The patient was then fully heparinized. I used Henley clamps to occlude the common femoral artery on the right. The baby Earl Lites peripheral DeBakey clamps were used to occlude distally. A #11 blade was used to make an arteriotomy which was extended longitudinally with Potts scissors. There was no thrombus within the common femoral artery. I spatulated the graft and performed an end-to-side anastomosis with running 5-0 Prolene I did not suture the anastomosis but rather turned my attention towards the left groin. Similarly the left common femoral artery was occluded.  A #11 blade was used to make an arteriotomy which was extended longitudinally with Potts scissors. I had to use a #5 Fogarty to occlude proximally. The graft was cut to the appropriate length and then spatulated to fit the size of the arteriotomy. An end-to-side anastomosis was created with running 5-0 Prolene. Prior to completion of the anastomosis appropriate flushing maneuvers were performed. The anastomosis was secured and blood flow was reestablished to the right leg. I then flushed bloodflow through the graft out the right groin. I then re flushed the common femoral profunda femoris superficial femoral arteries on the right and completed the anastomosis. A Ethibond suture was used to ligate the proximal common femoral artery At this point I felt that I needed to proceed with further  revascularization of the leg, since he had what likely is a acute popliteal aneurysm occlusion on the right. I made a  below knee incision with a #10 blade. Cautery was used to divide the subcutaneous tissue down to the fascia which was then opened with cautery. The  gastrocnemius muscle was reflected posteriorly. Soleal attachments were taken down from the tibia. I dissected out the popliteal vein and the artery. The artery was aneurysmal proximally. I ligated the anterior tibial vein. Once I had adequate exposure the artery I set out to harvest the vein. The vein appeared adequate in the groin however there was a early anterior bifurcation. I traced out both the main vein and the anterior branch and neither appeared to be adequate to serve as a bypass conduit. I therefore decided to use Gore-Tex. A long straight tunneler was used to create a subsartorial tunnel. The patient was then re\re heparinized. I occluded the common femoral artery and the superficial femoral artery with plans to place the graft directly onto the proximal superficial femoral artery. I did perform a limited endarterectomy in this area were a hard plaque was located. The proximal anastomosis was performed in an end-to-side fashion in the proximal superficial femoral artery once this anastomosis was completed I evaluated blood flow to the graft was pulsatile flow. The graft was then brought to the previously created tunnel.  I elected to use a tourniquet for the distal anastomosis. However was placed proximally tourniquet was placed the upper thigh the leg was exsanguinated with an Esmarch. Tourniquet was taken to 20 mm of pressure. I then performed an arteriotomy in the distal popliteal artery. There was thrombus within the popliteal artery. I passed a #3 Fogarty down the anterior tibial and posterior tibial artery and removed all thrombus the thrombus was sent for specimen. The graft was cut the appropriate length an end-to-side anastomosis was created with running CV 6 Gore-Tex suture. Prior to completion the tourniquet was let down I passed the Fogarty distally again down the anterior tibial artery it did go out onto the foot. I then flushed the bypass graft and completed the anastomosis and blood flow  was reestablished to the right leg. I used 2 Ethibond sutures to ligate the popliteal artery proximal to the distal anastomosis. An intraoperative arteriogram was performed the first one was not optimal and so I repeated there did appear to be in line flow to the anterior tibial artery on the foot. The posterior tibial artery looks as if it may occlude and ankle however the signals were similar to what I was getting on the other leg at this point I reversed the heparin with protamine. The groin incisions after they were dried up for closed with multiple layers of Vicryl and Dermabond placed. The incision used to explore the vein was closed with 2 layers of 3-0 Vicryl I elected not to perform fasciotomies at this time. However I did not close the fascia in the below knee incision. I closed this with the subcutaneous layer 2-0 Vicryl and closed the skin with 4-0 Vicryl. The patient had a Doppler signal and anterior tibial posterior tibial artery at the end of the procedure.   Disposition:  To PACU in stable condition.   Juleen China, M.D. [facility] [place] Office: [phone] Pager:  [phone]

## 2011-04-18 NOTE — Transfer of Care (Signed)
Immediate Anesthesia Transfer of Care Note  Patient: Mathew Padilla  Procedure(s) Performed:  BYPASS GRAFT FEMORAL-FEMORAL ARTERY - Left to right femoral to femoral bypass ; BYPASS GRAFT FEMORAL-POPLITEAL ARTERY - Right femoral popliteal bypass with propaten gortex graft  Patient Location: PACU  Anesthesia Type: General  Level of Consciousness: awake, alert  and oriented  Airway & Oxygen Therapy: Patient Spontanous Breathing and Patient connected to nasal cannula oxygen  Post-op Assessment: Report given to PACU RN and Post -op Vital signs reviewed and stable  Post vital signs: Reviewed and stable  Complications: No apparent anesthesia complications

## 2011-04-18 NOTE — Anesthesia Preprocedure Evaluation (Signed)
Anesthesia Evaluation  Patient identified by MRN, date of birth, ID band Patient awake    Reviewed: Allergy & Precautions, H&P , NPO status , Patient's Chart, lab work & pertinent test results, reviewed documented beta blocker date and time   Airway Mallampati: II TM Distance: >3 FB Neck ROM: full    Dental   Pulmonary shortness of breath and with exertion, sleep apnea , pneumonia ,          Cardiovascular Exercise Tolerance: Poor hypertension, On Medications + CAD, + Past MI and + Cardiac Stents     Neuro/Psych Negative Neurological ROS  Negative Psych ROS   GI/Hepatic negative GI ROS, Neg liver ROS, GERD-  Medicated,  Endo/Other  Negative Endocrine ROSDiabetes mellitus-Morbid obesity  Renal/GU negative Renal ROS  Genitourinary negative   Musculoskeletal   Abdominal   Peds  Hematology negative hematology ROS (+)   Anesthesia Other Findings See surgeon's H&P   Reproductive/Obstetrics negative OB ROS                           Anesthesia Physical Anesthesia Plan  ASA: IV and Emergent  Anesthesia Plan: General   Post-op Pain Management:    Induction: Intravenous, Rapid sequence and Cricoid pressure planned  Airway Management Planned: Video Laryngoscope Planned and Oral ETT  Additional Equipment:   Intra-op Plan:   Post-operative Plan: Extubation in OR  Informed Consent: I have reviewed the patients History and Physical, chart, labs and discussed the procedure including the risks, benefits and alternatives for the proposed anesthesia with the patient or authorized representative who has indicated his/her understanding and acceptance.     Plan Discussed with: CRNA and Surgeon  Anesthesia Plan Comments:         Anesthesia Quick Evaluation

## 2011-04-18 NOTE — Preoperative (Signed)
Beta Blockers   Reason not to administer Beta Blockers:Not Applicable 

## 2011-04-18 NOTE — ED Notes (Addendum)
Pt c/o lower back pain radiating down R leg and tailbone pain d/t fall 2 days ago, reports he is unable to rotate R foot and walk on R leg, sts "I just had to drag my R leg today." Pt received injections x3 in lower back w/o any relief, hx of sciatica nerve damage. Pt reports the pain started today after working in his yard. Reports bil feet feel ice cold. Increase lethargy, increase sob with and w/o exertion, and diaphoretic x3 mons, hx of throat cancer last year, sts "my energy has just gradually decreased over the last year."

## 2011-04-18 NOTE — OR Nursing (Signed)
AMasonRN was circulator from [phone]

## 2011-04-18 NOTE — ED Provider Notes (Signed)
History     CSN: [id number]  Arrival date & time 04/18/11  0035   First MD Initiated Contact with Patient 04/18/11 0330      Chief Complaint  Patient presents with  . Back Pain  . Leg Pain     Patient is a 60 y.o. male presenting with lower extremity pain. The history is provided by the patient and the spouse.  Foot Pain This is a new problem. The current episode started 3 to 5 hours ago. The problem occurs constantly. The problem has been gradually worsening. Pertinent negatives include no abdominal pain and no shortness of breath. The symptoms are aggravated by nothing. The symptoms are relieved by nothing.  pt presents with right foot pain/numbness for past several hours No trauma to foot He feels foot is cold He reports recent fall, no LOC, no head injury, and thought he may have reinjured his low back.   No new back pain No abd pain No active CP at this time No recent GI bleed, no h/o bloody stools He reports weakness/numbness in right foot but no weakness in right thigh  Past Medical History  Diagnosis Date  . Arteriosclerotic cardiovascular disease (ASCVD) 2000 he    PTCA of PDA and 2nd marginal in 3/00  . Hypertension     Mild; controlled with a single agent  . AAA (abdominal aortic aneurysm) 2007    stent graft repair 12/07  . Laryngeal carcinoma 2010    resection and radiation therapy 2010-11  . Diverticulitis 2006  . Obesity     BMI 47; lap band surgery 2010  . Gastroesophageal reflux disease     Hiatal hernia; previously treated with PPI  . Sleep apnea     BiPAP mask utilized   . Tobacco abuse, in remission     40 pack years; discontinued in 2000  . Cerebrovascular disease     carotid stent  . Cholelithiasis 11/28/2010    Past Surgical History  Procedure Date  . Peripheral arterial stent graft 2007    for abdominal aortic aneurysm  . Larynx surgery 2010    Resection of carcinoma  . Open anterior shoulder reconstruction 2011    Left  .  Laparoscopic gastric banding 2010  . Eye surgery   . Cataract extraction   . Colonoscopy 10/2007    Family History  Problem Relation Age of Onset  . Heart attack Father     deceased  . Diabetes Father   . Heart failure Father   . Hyperlipidemia Father   . Hypertension Father   . Lung cancer Mother     deceased   . Hypertension Mother   . Hyperlipidemia Mother   . Heart failure Mother   . Diabetes Mother   . Cancer Sister     History  Substance Use Topics  . Smoking status: Former Smoker    Quit date: 11/26/1997  . Smokeless tobacco: Never Used   Comment: 40 pack years; quit 2000  . Alcohol Use: No      Review of Systems  Respiratory: Negative for shortness of breath.   Gastrointestinal: Negative for abdominal pain.  All other systems reviewed and are negative.    Allergies  Statins  Home Medications   Current Outpatient Rx  Name Route Sig Dispense Refill  . LISINOPRIL 40 MG PO TABS Oral Take 40 mg by mouth daily.      Marland Kitchen OMEPRAZOLE 20 MG PO CPDR Oral Take 20 mg by mouth daily.     Marland Kitchen  POTASSIUM CHLORIDE CRYS CR 10 MEQ PO TBCR Oral Take 20 mEq by mouth 2 (two) times daily.      . DULOXETINE HCL 30 MG PO CPEP Oral Take 30 mg by mouth daily.       BP 143/71  Pulse 91  Temp(Src) 98.7 F (37.1 C) (Oral)  Resp 20  SpO2 96%  Physical Exam  CONSTITUTIONAL: Well developed/well nourished HEAD AND FACE: Normocephalic/atraumatic EYES: EOMI/PERRL ENMT: Mucous membranes moist NECK: supple no meningeal signs SPINE:entire spine nontender CV: S1/S2 noted, no murmurs/rubs/gallops noted LUNGS: Lungs are clear to auscultation bilaterally, no apparent distress ABDOMEN: soft, nontender, no rebound or guarding GU:no cva tenderness NEURO: Pt is awake/alert, moves all extremitiesx4.  Reports numbness to right foot only He can actively flex right hip, and fully flex/extend right knee/ankle EXTREMITIES: left LE - femoral pulse, popliteal pulse and DP pulse noted Right LE  - no pulse noted to fem/popliteal/DP His right foot is cold to touch and delayed cap refill No pulse by doppler SKIN: right foot is cold PSYCH: no abnormalities of mood noted   ED Course  Procedures   CRITICAL CARE Performed by: Joya Gaskins   Total critical care time: 40  Critical care time was exclusive of separately billable procedures and treating other patients.  Critical care was necessary to treat or prevent imminent or life-threatening deterioration.  Critical care was time spent personally by me on the following activities: development of treatment plan with patient and/or surrogate as well as nursing, discussions with consultants, evaluation of patient's response to treatment, examination of patient, obtaining history from patient or surrogate, ordering and performing treatments and interventions, ordering and review of laboratory studies, ordering and review of radiographic studies, pulse oximetry and re-evaluation of patient's condition.    Labs Reviewed  CBC  DIFFERENTIAL  BASIC METABOLIC PANEL  PROTIME-INR  APTT  TYPE AND SCREEN    3:43 AM Concern for arterial occlusion of right LE I placed called to vascular surgery  3:54 AM D/w dr Myra Gianotti Requests I start heparin drip Obtain ct angio of abd/pelvis with runoff Then will transfer to [place]  4:48 AM D/w dr Myra Gianotti, he has reviewed CT imaging and he has occluded aortic graft Will take directly to OR    MDM  Nursing notes reviewed and considered in documentation All labs/vitals reviewed and considered Previous records reviewed and considered     Date: 04/18/2011  Rate: 70  Rhythm: normal sinus rhythm  QRS Axis: normal  Intervals: normal  ST/T Wave abnormalities: nonspecific ST changes  Conduction Disutrbances:none  Narrative Interpretation:   Old EKG Reviewed: unchanged       Joya Gaskins, MD 04/18/11 [phone]

## 2011-04-18 NOTE — Progress Notes (Signed)
ANTICOAGULATION CONSULT NOTE - Initial Consult  Pharmacy Consult for heparin - PER MD DOSING - DO NOT ADJUST!  Indication: s/p femoral-femoral graft  Allergies  Allergen Reactions  . Statins Hives and Rash    Patient Measurements:     Vital Signs: Temp: 99 F (37.2 C) (12/21 1445) BP: 153/81 mmHg (12/21 1533) Pulse Rate: 91  (12/21 1542)  Labs:  Basename 04/18/11 0358 04/18/11 0320  HGB 17.3* 16.0  HCT 51.0 46.5  PLT -- 157  APTT -- 31  LABPROT -- 13.7  INR -- 1.03  HEPARINUNFRC -- --  CREATININE 0.90 0.79  CKTOTAL -- --  CKMB -- --  TROPONINI -- --   The CrCl is unknown because both a height and weight (above a minimum accepted value) are required for this calculation.  Medical History: Past Medical History  Diagnosis Date  . Arteriosclerotic cardiovascular disease (ASCVD) 2000 he    PTCA of PDA and 2nd marginal in 3/00  . Hypertension     Mild; controlled with a single agent  . AAA (abdominal aortic aneurysm) 2007    stent graft repair 12/07  . Laryngeal carcinoma 2010    resection and radiation therapy 2010-11  . Diverticulitis 2006  . Obesity     BMI 47; lap band surgery 2010  . Gastroesophageal reflux disease     Hiatal hernia; previously treated with PPI  . Sleep apnea     BiPAP mask utilized   . Tobacco abuse, in remission     40 pack years; discontinued in 2000  . Cerebrovascular disease     carotid stent  . Cholelithiasis 11/28/2010    Medications:  Scheduled:    . cefUROXime (ZINACEF)  IV  1.5 g Intravenous Q12H  . docusate sodium  100 mg Oral Daily  . DULoxetine  30 mg Oral Daily  . enoxaparin  40 mg Subcutaneous Q24H  . famotidine (PEPCID) IV  20 mg Intravenous Q12H  . lisinopril  40 mg Oral Daily  . pantoprazole  40 mg Oral Q1200  . potassium chloride  20 mEq Oral BID    Assessment: 60 yo male on IV heparin at 500 units/hr s/p OR for bypass graft.  No bleeding noted in chart.  Goal of Therapy:  N/A   Plan:  Will continue IV  heparin at 500 units/hr as per MD order.  Will check heparin level and CBC in AM - will NOT adjust heparin unless further instructed.  Mathew Padilla C 04/18/2011,4:33 PM

## 2011-04-18 NOTE — Anesthesia Postprocedure Evaluation (Signed)
Anesthesia Post Note  Patient: Mathew Padilla  Procedure(s) Performed:  BYPASS GRAFT FEMORAL-FEMORAL ARTERY - Left to right femoral to femoral bypass ; BYPASS GRAFT FEMORAL-POPLITEAL ARTERY - Right femoral popliteal bypass with propaten gortex graft  Anesthesia type: General  Patient location: PACU  Post pain: Pain level controlled and Adequate analgesia  Post assessment: Post-op Vital signs reviewed, Patient's Cardiovascular Status Stable, Respiratory Function Stable, Patent Airway and Pain level controlled  Last Vitals:  Filed Vitals:   04/18/11 0511  BP: 128/68  Pulse:   Temp:   Resp: 15    Post vital signs: Reviewed and stable  Level of consciousness: awake, alert  and oriented  Complications: No apparent anesthesia complications

## 2011-04-18 NOTE — ED Notes (Signed)
Pt c/o intermittent episodes of L side chest pain, hx of MI and cardiac cath w/6 blockages, stent placed and angioplasty performed. Pt also reports numbness to bil legs after cardiac cath, sts toe nails were blue today, complete numbness to R leg today, has a rx for Ciallis and Viagra but sts medication does not work, increase urgency to urinate, productive cough w/thick colored yellow/clear sputum, dx w/pneumonia 2 wks ago and prescribed 3 different types of ABX.

## 2011-04-18 NOTE — ED Notes (Signed)
Unable to palpate R groin or R pedal pulse or obtain w/a doppler, EDP Wickline informed and at bedside, R foot is cold w/blue nail beds, no R side capillary refill, L foot warm and pink w/<3 capillary refill, no refelx to R foot

## 2011-04-18 NOTE — ED Notes (Signed)
Pt complains of back and leg pain, states that he fell two days ago and injured his tailbone, tonight he complains of right leg numbness and swelling and blue under the toenails, he states that two weeks ago he had a cramp in the right leg and the soreness hasn't gone away

## 2011-04-18 NOTE — H&P (Signed)
[facility] [place]      History and Physical  Patient name: Mathew Padilla MRN: [id number] DOB: [birth year] Sex: male   Reason for Admission:  Chief Complaint  Patient presents with  . Back Pain  . Leg Pain    HISTORY OF PRESENT ILLNESS: The patient presented to the emergency department earlier this evening with numbness of his right foot. He was found to be without pulses in his foot. Doppler signals also could not be heard. The patient has a history of an endovascular aneurysm repair by Dr. Darrick Penna in 2007 using a [facility] device. He was therefore sent for a CT angiogram of the abdomen and pelvis with bilateral runoff. This reveals an occluded right limb of the stent graft as well as popliteal occlusion on the right. [facility] then [facility]. Upon arrival I was waiting for the patient. He states that his symptoms began at 10:00 this evening. He states that his foot went to sleep and never woke up. There are no aggravating or relieving factors. He denies symptoms of claudication prior to this event.  Has a history of coronary artery disease and is status post balloon angioplasty of the PDA and OM 2 and March of 2000 his most recent stress test was in November of 2011 which was normal. The patient is morbidly obese. He is undergone laparoscopic gastric banding but is regained most of his weight. He also suffers from hypertension which is medically managed. He complains of back pain. He is allergic to statins and he cannot tolerate aspirin.  Past Medical History  Diagnosis Date  . Arteriosclerotic cardiovascular disease (ASCVD) 2000 he    PTCA of PDA and 2nd marginal in 3/00  . Hypertension     Mild; controlled with a single agent  . AAA (abdominal aortic aneurysm) 2007    stent graft repair 12/07  . Laryngeal carcinoma 2010    resection and radiation therapy 2010-11  . Diverticulitis 2006  . Obesity     BMI 47; lap band surgery 2010    . Gastroesophageal reflux disease     Hiatal hernia; previously treated with PPI  . Sleep apnea     BiPAP mask utilized   . Tobacco abuse, in remission     40 pack years; discontinued in 2000  . Cerebrovascular disease     carotid stent  . Cholelithiasis 11/28/2010    Past Surgical History  Procedure Date  . Peripheral arterial stent graft 2007    for abdominal aortic aneurysm  . Larynx surgery 2010    Resection of carcinoma  . Open anterior shoulder reconstruction 2011    Left  . Laparoscopic gastric banding 2010  . Eye surgery   . Cataract extraction   . Colonoscopy 10/2007    History   Social History  . Marital Status: Married    Spouse Name: N/A    Number of Children: N/A  . Years of Education: N/A   Occupational History  . Not on file.   Social History Main Topics  . Smoking status: Former Smoker    Quit date: 11/26/1997  . Smokeless tobacco: Never Used   Comment: 40 pack years; quit 2000  . Alcohol Use: No  . Drug Use: No  . Sexually Active: Yes   Other Topics Concern  . Not on file   Social History Narrative   Married, past employed, does not get regular exercise.     Family History  Problem Relation Age of Onset  . Heart attack Father     deceased  . Diabetes Father   . Heart failure Father   . Hyperlipidemia Father   . Hypertension Father   . Lung cancer Mother     deceased   . Hypertension Mother   . Hyperlipidemia Mother   . Heart failure Mother   . Diabetes Mother   . Cancer Sister     Allergies as of 04/18/2011 - Review Complete 04/18/2011  Allergen Reaction Noted  . Statins Hives and Rash 11/21/2009    No current facility-administered medications on file prior to encounter.   Current Outpatient Prescriptions on File Prior to Encounter  Medication Sig Dispense Refill  . lisinopril (PRINIVIL,ZESTRIL) 40 MG tablet Take 40 mg by mouth daily.        Marland Kitchen omeprazole (PRILOSEC) 20 MG capsule Take 20 mg by mouth daily.       .  DULoxetine (CYMBALTA) 30 MG capsule Take 30 mg by mouth daily.          REVIEW OF SYSTEMS: Cardiovascular: No chest pain, chest pressure, palpitations, orthopnea, or dyspnea on exertion. No claudication or rest pain,  No history of DVT or phlebitis. Pulmonary: Positive For shortness of breath Neurologic: Numbness in the right foot Hematologic: No bleeding problems or clotting disorders. Musculoskeletal: No joint pain or joint swelling. Gastrointestinal: No blood in stool or hematemesis Genitourinary: No dysuria or hematuria. Psychiatric:: No history of major depression. Integumentary: No rashes or ulcers. Constitutional: No fever or chills.  PHYSICAL EXAMINATION: General: The patient appears their stated age.  Vital signs are BP 128/68  Pulse 91  Temp(Src) 98.7 F (37.1 C) (Oral)  Resp 15  SpO2 98% HEENT:  No gross abnormalities Pulmonary: Respirations are non-labored Abdomen: Soft and non-tender . Musculoskeletal: There are no major deformities.   Neurologic: No sensation and motor function in the right foot Skin: There are no ulcer or rashes noted. Psychiatric: The patient has normal affect. Cardiovascular: There is a regular rate and rhythm without significant murmur appreciated. He has palpable pedal pulses on the left nonpalpable on the right the right foot is mottled in appearance and very cool  Diagnostic Studies: CT angiogram shows occluded right limb of aortic graft with popliteal occlusion as well    Assessment:  Ischemic right leg Plan: I started the patient on a heparin drip and transferred from [facility] to [place]. He clearly has an ischemic leg. I discussed that this was a limb threatening situation and that we need to proceed emergently to the operating room for revascularization. I discussed planning on doing a left-to-right femoral-femoral bypass graft. At this point I'm unsure whether the clot in his right popliteal artery is acute or chronic. I told  him I may make an incision below the knee to try to remove some of the thrombus within his popliteal artery. He may also require fasciotomies.     Jorge Ny, M.D. [facility] [place] Office: [phone] Pager:  [phone]

## 2011-04-19 LAB — BASIC METABOLIC PANEL
CO2: 23 mEq/L (ref 19–32)
Chloride: 101 mEq/L (ref 96–112)
Sodium: 133 mEq/L — ABNORMAL LOW (ref 135–145)

## 2011-04-19 LAB — HEPARIN LEVEL (UNFRACTIONATED): Heparin Unfractionated: <0.1 IU/mL — ABNORMAL LOW (ref 0.30–0.70)

## 2011-04-19 LAB — CBC
Platelets: 141 10*3/uL — ABNORMAL LOW (ref 150–400)
RBC: 3.65 MIL/uL — ABNORMAL LOW (ref 4.22–5.81)
WBC: 13 10*3/uL — ABNORMAL HIGH (ref 4.0–10.5)

## 2011-04-19 MED ORDER — MENTHOL 3 MG MT LOZG
1.0000 | LOZENGE | OROMUCOSAL | Status: DC | PRN
  Filled 2011-04-19: qty 9

## 2011-04-19 MED ORDER — TRIAZOLAM 0.25 MG PO TABS
0.2500 mg | ORAL_TABLET | Freq: Every evening | ORAL | Status: DC | PRN
  Administered 2011-04-19 – 2011-04-20 (×2): 0.25 mg via ORAL
  Filled 2011-04-19 (×2): qty 1

## 2011-04-19 NOTE — Progress Notes (Signed)
Pt tx 2000, VSS, pt family at [facility] and updated, all questions answered,pt and family verbalized understanding of tx pulses right foot dopperable, report called, new orders communicated all questions answered

## 2011-04-19 NOTE — Progress Notes (Signed)
VASCULAR PROGRESS NOTE  SUBJECTIVE: Did not sleep well last night. Requesting antacid, Cepacol lozenges, removal of monitoring lines, soft her bed, and transferred to regular bed.  PHYSICAL EXAM: Filed Vitals:   04/18/11 1900 04/18/11 2300 04/19/11 0005 04/19/11 0300  BP: 121/65 107/54  104/61  Pulse: 117 116 106 94  Temp: 99.9 F (37.7 C) 97.2 F (36.2 C)  98.5 F (36.9 C)  TempSrc: Oral Oral  Oral  Resp: 16 17 22 21   Height:      Weight:      SpO2: 94% 97% 96% 97%   Lungs: clear to auscultation Incisions all look good. Right foot warm and well perfused.  LABS: Lab Results  Component Value Date   WBC 13.0* 04/19/2011   HGB 11.6* 04/19/2011   HCT 34.7* 04/19/2011   MCV 95.1 04/19/2011   PLT 141* 04/19/2011   Lab Results  Component Value Date   CREATININE 0.80 04/19/2011   Lab Results  Component Value Date   INR 1.03 04/18/2011   CBG (last 3)  No results found for this basename: GLUCAP:3 in the last 72 hours   ASSESSMENT/PLAN: 1. 1 Day Post-Op s/p: BYPASS GRAFT FEMORAL-FEMORAL ARTERY BYPASS GRAFT RIGHT FEMORAL-POPLITEAL ARTERY 2. Transferred to 2000 3. Cepacol lozenges 4. The patient is receiving an H2 blocker 5. Continue low dose heparin for 48 hours. 6. Ambulate with assistance  Waverly Ferrari, MD, FACS Beeper: [phone] 04/19/2011

## 2011-04-19 NOTE — Progress Notes (Signed)
VASCULAR LAB PRELIMINARY  PRELIMINARY  PRELIMINARY  PRELIMINARY  ARTERIAL  ABI completed:    RIGHT    LEFT    PRESSURE WAVEFORM  PRESSURE WAVEFORM  BRACHIAL 124 Triphasic BRACHIAL 133 Triphasic  DP   DP    PT 109 Monophasic PT 81 Monophasic  AT 103 Monophasic AT 110 Monophasic  PER   PER    GREAT TOE   GREAT TOE      RIGHT LEFT  ABI 0.82 0.83     Mathew Padilla, Karsten Ro, RVT 04/19/2011, 12:40 PM

## 2011-04-19 NOTE — Progress Notes (Signed)
PT was already on CPAP at this time when RT stopped by to check on PT. PT tolerating well. RT will continue to monitor.

## 2011-04-19 NOTE — Progress Notes (Signed)
ANTICOAGULATION CONSULT NOTE - Initial Consult  Pharmacy Consult for heparin - PER MD DOSING - DO NOT ADJUST!  Indication: s/p femoral-femoral graft  Allergies  Allergen Reactions  . Statins Hives and Rash    Patient Measurements: Height: 5\' 11"  (180.3 cm) Weight: 310 lb 10.1 oz (140.9 kg) IBW/kg (Calculated) : 75.3    Vital Signs: Temp: 98.2 F (36.8 C) (12/22 1121) Temp src: Oral (12/22 1121) BP: 108/89 mmHg (12/22 1121) Pulse Rate: 84  (12/22 1121)  Labs:  Basename 04/19/11 0330 04/19/11 0030 04/18/11 1634 04/18/11 1137 04/18/11 0358 04/18/11 0320  HGB -- 11.6* 12.5* -- -- --  HCT -- 34.7* 37.6* 38.0* -- --  PLT -- 141* 122* -- -- 157  APTT -- -- -- -- -- 31  LABPROT -- -- -- -- -- 13.7  INR -- -- -- -- -- 1.03  HEPARINUNFRC <0.10* -- -- -- -- --  CREATININE -- 0.80 -- -- 0.90 0.79  CKTOTAL -- -- -- -- -- --  CKMB -- -- -- -- -- --  TROPONINI -- -- -- -- -- --   Estimated Creatinine Clearance: 139.2 ml/min (by C-G formula based on Cr of 0.8).  Medical History: Past Medical History  Diagnosis Date  . Arteriosclerotic cardiovascular disease (ASCVD) 2000 he    PTCA of PDA and 2nd marginal in 3/00  . Hypertension     Mild; controlled with a single agent  . AAA (abdominal aortic aneurysm) 2007    stent graft repair 12/07  . Laryngeal carcinoma 2010    resection and radiation therapy 2010-11  . Diverticulitis 2006  . Obesity     BMI 47; lap band surgery 2010  . Gastroesophageal reflux disease     Hiatal hernia; previously treated with PPI  . Sleep apnea     BiPAP mask utilized   . Tobacco abuse, in remission     40 pack years; discontinued in 2000  . Cerebrovascular disease     carotid stent  . Cholelithiasis 11/28/2010    Medications:  Scheduled:     . cefUROXime (ZINACEF)  IV  1.5 g Intravenous Q12H  . docusate sodium  100 mg Oral Daily  . DULoxetine  30 mg Oral Daily  . famotidine (PEPCID) IV  20 mg Intravenous Q12H  . lisinopril  40 mg Oral  Daily  . pantoprazole  40 mg Oral Q1200  . potassium chloride  20 mEq Oral BID  . DISCONTD: enoxaparin  40 mg Subcutaneous Q24H    Assessment: 60 yo male on IV heparin at 500 units/hr s/p OR for bypass graft.  No bleeding noted in chart.  Goal of Therapy:  N/A   Plan:  Will continue IV heparin at 500 units/hr as per MD order.  Heparin level <10 this AM, CBC unremarkable for heparin related issues.  Katrinka Blazing [place] R 04/19/2011,11:23 AM

## 2011-04-20 MED ORDER — TAMSULOSIN HCL 0.4 MG PO CAPS
0.4000 mg | ORAL_CAPSULE | Freq: Every day | ORAL | Status: DC
  Administered 2011-04-20: 0.4 mg via ORAL
  Filled 2011-04-20 (×3): qty 1

## 2011-04-20 MED ORDER — TAMSULOSIN HCL 0.4 MG PO CAPS: 0.4000 mg | ORAL_CAPSULE | Freq: Every day | ORAL | Status: DC

## 2011-04-20 MED ORDER — OXYCODONE-ACETAMINOPHEN 5-325 MG PO TABS: 1.0000 | ORAL_TABLET | ORAL | Status: DC | PRN

## 2011-04-20 MED ORDER — POLYETHYLENE GLYCOL 3350 17 G PO PACK
17.0000 g | PACK | Freq: Every day | ORAL | Status: DC
  Administered 2011-04-20 – 2011-04-21 (×2): 17 g via ORAL
  Filled 2011-04-20 (×2): qty 1

## 2011-04-20 NOTE — Progress Notes (Addendum)
[facility] [place]  Progress Note Bypass Surgery  Date of Surgery: 04/18/2011 Procedure: Procedure(s): BYPASS GRAFT FEMORAL-FEMORAL ARTERY BYPASS GRAFT FEMORAL-POPLITEAL ARTERY right Surgeon: Surgeon(s): Juleen China, MD POD : 2 Days Post-Op  History of Present Illness  Mathew Padilla is a 60 y.o. male who is BYPASS GRAFT FEMORAL-FEMORAL ARTERY RIGHT BYPASS GRAFT FEMORAL-POPLITEAL ARTERY surgery.  The patient's wounds are healing well.   Patients pain is well controlled.  Pt. States he had issues with swelling and has enlarged prostate necessitating reinsertion of foley after last surgery. VASC. LAB Studies:        ABI: Right 0.82;  Left 0.83;        Dg Ang/ext/uni/or Right  04/18/2011  *RADIOLOGY REPORT*  Clinical Data: Intraoperative angiogram  RIGHT EXTREMITY ARTERIOGRAPHY  Comparison: None.  Findings: Contrast fills the femoral to popliteal arterial bypass graft.  Anterior tibial artery is patent to the ankle four vessel vessel runoff.  Posterior tibial and peroneal arteries fill to the distal calf.  IMPRESSION: Fem-pop bypass graft patent with one vessel runoff.  Original Report Authenticated By: Donavan Burnet, M.D.    Significant Diagnostic Studies: CBC    Component Value Date/Time   WBC 13.0* 04/19/2011 0030   RBC 3.65* 04/19/2011 0030   HGB 11.6* 04/19/2011 0030   HCT 34.7* 04/19/2011 0030   PLT 141* 04/19/2011 0030   MCV 95.1 04/19/2011 0030   MCH 31.8 04/19/2011 0030   MCHC 33.4 04/19/2011 0030   RDW 14.1 04/19/2011 0030   LYMPHSABS 1.5 04/18/2011 0320   MONOABS 1.1* 04/18/2011 0320   EOSABS 0.1 04/18/2011 0320   BASOSABS 0.0 04/18/2011 0320    BMET    Component Value Date/Time   NA 133* 04/19/2011 0030   K 3.8 04/19/2011 0030   CL 101 04/19/2011 0030   CO2 23 04/19/2011 0030   GLUCOSE 178* 04/19/2011 0030   BUN 12 04/19/2011 0030   CREATININE 0.80 04/19/2011 0030   CALCIUM 7.8* 04/19/2011 0030   GFRNONAA >90 04/19/2011 0030    GFRAA >90 04/19/2011 0030    COAG Lab Results  Component Value Date   INR 1.03 04/18/2011   No results found for this basename: PTT    Physical Examination  BP Readings from Last 3 Encounters:  04/20/11 115/65  04/20/11 115/65  11/25/10 117/70   Temp Readings from Last 3 Encounters:  04/20/11 98.4 F (36.9 C)   04/20/11 98.4 F (36.9 C)   11/23/10 98.1 F (36.7 C) Oral   SpO2 Readings from Last 3 Encounters:  04/20/11 96%  04/20/11 96%  11/25/10 96%      Pt is A&O x 3 right lower extremity: Incision/s is/are clean,dry.intact, and  healing without hematoma, erythema or drainage Both LE are warm; with good color  Assessment: Urinary retention and BPH  Pt. Doing well Post-op pain is controlled Wounds are healing well Bypass is open and extremities are well perfused  Plan: PT/OT for ambulation Continue wound care as ordered Will leave foley today and DC in am Will start flomax today TKO IVF ? Continue heparin  Marlowe Shores [phone] 04/20/2011 7:18 AM  Stop heparin Complains of some productive cough. Ordered IS. Continue to mobilize.  Steady progress. Home in a couple of days.  Di Kindle. Edilia Bo, MD, FACS Beeper [phone] 04/20/2011

## 2011-04-20 NOTE — Progress Notes (Signed)
Patient is up in chair with help and rolling walker.  Stated he could barely feel right leg. Tolerated well otherwise.  Will continue to monitor.

## 2011-04-20 NOTE — Progress Notes (Signed)
Patient called and let me know his foley had half way come out and was hurting and leaking urine around the foley.  Foley was then d/c'd.  Will continue to monitor.

## 2011-04-21 ENCOUNTER — Other Ambulatory Visit: Payer: Self-pay | Admitting: Thoracic Diseases

## 2011-04-21 DIAGNOSIS — I724 Aneurysm of artery of lower extremity: Secondary | ICD-10-CM

## 2011-04-21 MED ORDER — BISACODYL 10 MG RE SUPP
10.0000 mg | Freq: Every day | RECTAL | Status: DC | PRN
  Administered 2011-04-21: 10 mg via RECTAL
  Filled 2011-04-21: qty 1

## 2011-04-21 MED ORDER — TAMSULOSIN HCL 0.4 MG PO CAPS: 0.4000 mg | ORAL_CAPSULE | Freq: Every day | ORAL | Status: DC

## 2011-04-21 MED ORDER — POLYETHYLENE GLYCOL 3350 17 G PO PACK: 17.0000 g | PACK | Freq: Every day | ORAL | Status: AC

## 2011-04-21 MED ORDER — SENNA 8.6 MG PO TABS: 2.0000 | ORAL_TABLET | Freq: Every day | ORAL | Status: DC

## 2011-04-21 MED ORDER — BISACODYL 10 MG RE SUPP: 10.0000 mg | Freq: Every day | RECTAL | Status: DC | PRN

## 2011-04-21 MED ORDER — TRIAZOLAM 0.25 MG PO TABS: 0.2500 mg | ORAL_TABLET | Freq: Every evening | ORAL | Status: DC | PRN

## 2011-04-21 MED ORDER — SENNA 8.6 MG PO TABS
2.0000 | ORAL_TABLET | Freq: Every day | ORAL | Status: DC
  Filled 2011-04-21: qty 2

## 2011-04-21 MED ORDER — ASPIRIN EC 81 MG PO TBEC: 81.0000 mg | DELAYED_RELEASE_TABLET | Freq: Every day | ORAL | Status: DC

## 2011-04-21 NOTE — Discharge Summary (Signed)
[facility] Discharge Summary   Patient ID:  Mathew Padilla MRN: [id number] DOB/AGE: 61-22-1952 61 y.o.  Admit date: 04/18/2011 Discharge date: 04/21/2011 Date of Surgery: 04/18/2011 Surgeon: Surgeon(s): Seth Bake Durene Cal, MD  Admission Diagnosis: Arterial occlusion [444.9] back and r leg pain  Discharge Diagnoses:  Arterial occlusion [444.9] back and r leg pain  Secondary Diagnoses: Past Medical History  Diagnosis Date  . Arteriosclerotic cardiovascular disease (ASCVD) 2000 he    PTCA of PDA and 2nd marginal in 3/00  . Hypertension     Mild; controlled with a single agent  . AAA (abdominal aortic aneurysm) 2007    stent graft repair 12/07  . Laryngeal carcinoma 2010    resection and radiation therapy 2010-11  . Diverticulitis 2006  . Obesity     BMI 47; lap band surgery 2010  . Gastroesophageal reflux disease     Hiatal hernia; previously treated with PPI  . Sleep apnea     BiPAP mask utilized   . Tobacco abuse, in remission     40 pack years; discontinued in 2000  . Cerebrovascular disease     carotid stent  . Cholelithiasis 11/28/2010    Procedures:#1: Redo bilateral common femoral artery exposure #2 left-to-right femoral-femoral bypass graft with 8 mm dacryon #3 right femoral to below knee popliteal artery bypass graft with 8 mm Propatent for ligation popliteal aneurysm #5 intraoperative angiogram x2 #6 thrombectomy right anterior tibial and posterior tibial arteries  Discharged Condition: good  HPI: Mathew Padilla is a 60 y.o. male who presented to the emergency department with an ischemic right leg. He complained of numbness and decreased sensation and motor function. CT angiogram revealed that he had occluded the right limb of his aortic stent graft. In addition he had a thrombosed right popliteal aneurysm I discussed these findings with the patient. Dr. Myra Gianotti recommended emergent operation to restore blood flow. He understands that this is  a limb threatening situation.  Hospital Course:  Mathew Padilla is a 60 y.o. male is S/P  Procedure(s): BYPASS GRAFT FEMORAL-FEMORAL ARTERY BYPASS GRAFT Right FEMORAL-POPLITEAL ARTERY Extubated: POD # 0 Post-op wounds healing well Pt. Ambulating, voiding and taking PO diet without difficulty. Pt pain controlled with PO pain meds. Labs as below Complications:none  Consults:  Treatment Team:  Juleen China, MD Sherren Kerns, MD  Significant Diagnostic Studies: CBC    Component Value Date/Time   WBC 13.0* 04/19/2011 0030   RBC 3.65* 04/19/2011 0030   HGB 11.6* 04/19/2011 0030   HCT 34.7* 04/19/2011 0030   PLT 141* 04/19/2011 0030   MCV 95.1 04/19/2011 0030   MCH 31.8 04/19/2011 0030   MCHC 33.4 04/19/2011 0030   RDW 14.1 04/19/2011 0030   LYMPHSABS 1.5 04/18/2011 0320   MONOABS 1.1* 04/18/2011 0320   EOSABS 0.1 04/18/2011 0320   BASOSABS 0.0 04/18/2011 0320    BMET    Component Value Date/Time   NA 133* 04/19/2011 0030   K 3.8 04/19/2011 0030   CL 101 04/19/2011 0030   CO2 23 04/19/2011 0030   GLUCOSE 178* 04/19/2011 0030   BUN 12 04/19/2011 0030   CREATININE 0.80 04/19/2011 0030   CALCIUM 7.8* 04/19/2011 0030   GFRNONAA >90 04/19/2011 0030   GFRAA >90 04/19/2011 0030    COAG Lab Results  Component Value Date   INR 1.03 04/18/2011     Disposition:  Discharge to :Home Discharge Orders    Future Appointments: Provider: Department: Dept Phone: Center:  05/12/2011 1:00 PM [facility] [phone] [facility]   05/12/2011 1:30 PM V Durene Cal, MD [facility] [phone] [facility]   07/25/2011 9:30 AM [facility] [phone] [facility]     Future Orders Please Complete By Expires   Resume previous diet      Driving Restrictions      Comments:   No driving for 4 weeks   Lifting restrictions      Comments:   No lifting for 6 weeks   Call MD for:  temperature >100.5      Call MD for:  redness, tenderness, or signs of infection  (pain, swelling, bleeding, redness, odor or green/yellow discharge around incision site)      Call MD for:  severe or increased pain, loss or decreased feeling  in affected limb(s)      May shower       Walk with assistance      Increase activity slowly      Comments:   Walk with assistance use walker or cane as needed   may wash over wound with mild soap and water      No dressing needed         Mathew Padilla, Mathew Padilla  Home Medication Instructions [id number]   Printed on:04/21/11 1010  Medication Information                    lisinopril (PRINIVIL,ZESTRIL) 40 MG tablet Take 40 mg by mouth daily.             omeprazole (PRILOSEC) 20 MG capsule Take 20 mg by mouth daily.            potassium chloride (K-DUR,KLOR-CON) 10 MEQ tablet Take 20 mEq by mouth 2 (two) times daily.             oxyCODONE-acetaminophen (PERCOCET) 5-325 MG per tablet Take 1-2 tablets by mouth every 4 (four) hours as needed.           bisacodyl (DULCOLAX) 10 MG suppository Place 1 suppository (10 mg total) rectally daily as needed.           senna (SENOKOT) 8.6 MG TABS Take 2 tablets (17.2 mg total) by mouth at bedtime.           polyethylene glycol (MIRALAX / GLYCOLAX) packet Take 17 g by mouth daily.           triazolam (HALCION) 0.25 MG tablet Take 1 tablet (0.25 mg total) by mouth at bedtime as needed.           Tamsulosin HCl (FLOMAX) 0.4 MG CAPS Take 1 capsule (0.4 mg total) by mouth daily after supper.           aspirin EC 81 MG tablet Take 1 tablet (81 mg total) by mouth daily.            Verbal and written Discharge instructions given to the patient. Wound care per Discharge AVS Follow-up Information    Follow up with HAWKINS,EDWARD L, MD. Make an appointment in 2 weeks.      Follow up with Myra Gianotti IV, Lala Lund, MD in 2 weeks. (office will arrange - Called)    Contact information:   [address] [place] [address] [phone]          Signed: Marlowe Shores 04/21/2011, 10:10 AM

## 2011-04-21 NOTE — Progress Notes (Signed)
Physical Therapy Evaluation Patient Details Name: Mathew Padilla MRN: [id number] DOB: 1951-03-11 Today's Date: 04/21/2011  Problem List:  Patient Active Problem List  Diagnoses  . HYPERTENSION  . Arteriosclerotic cardiovascular disease (ASCVD)  . Laryngeal carcinoma  . AAA (abdominal aortic aneurysm)  . Diverticulitis  . Obesity  . Gastroesophageal reflux disease  . Sleep apnea  . Tobacco abuse, in remission  . Cerebrovascular disease  . Low back pain  . Pneumonia  . Cholelithiasis  . Pulmonary infiltrate    Past Medical History:  Past Medical History  Diagnosis Date  . Arteriosclerotic cardiovascular disease (ASCVD) 2000 he    PTCA of PDA and 2nd marginal in 3/00  . Hypertension     Mild; controlled with a single agent  . AAA (abdominal aortic aneurysm) 2007    stent graft repair 12/07  . Laryngeal carcinoma 2010    resection and radiation therapy 2010-11  . Diverticulitis 2006  . Obesity     BMI 47; lap band surgery 2010  . Gastroesophageal reflux disease     Hiatal hernia; previously treated with PPI  . Sleep apnea     BiPAP mask utilized   . Tobacco abuse, in remission     40 pack years; discontinued in 2000  . Cerebrovascular disease     carotid stent  . Cholelithiasis 11/28/2010   Past Surgical History:  Past Surgical History  Procedure Date  . Peripheral arterial stent graft 2007    for abdominal aortic aneurysm  . Larynx surgery 2010    Resection of carcinoma  . Open anterior shoulder reconstruction 2011    Left  . Laparoscopic gastric banding 2010  . Eye surgery   . Cataract extraction   . Colonoscopy 10/2007    PT Assessment/Plan/Recommendation PT Assessment Clinical Impression Statement: Patient is s/p popliteal bypass sx. with decr. mobility secondary to decr. endurance and incr pain.  Will benefit from PT to address balance and endurance issues.  Wife supportive but worried about her ability to care for pt. at home.  May need hospital bed,  RW and 3 N1.   PT Recommendation/Assessment: Patient will need skilled PT in the acute care venue PT Problem List: Decreased activity tolerance;Decreased balance;Decreased mobility;Decreased knowledge of use of DME;Decreased safety awareness;Decreased knowledge of precautions;Pain PT Therapy Diagnosis : Difficulty walking PT Plan PT Frequency: Min 3X/week PT Treatment/Interventions: DME instruction;Gait training;Stair training;Functional mobility training;Therapeutic activities;Therapeutic exercise;Balance training;Patient/family education PT Recommendation Follow Up Recommendations: Home health PT;24 hour supervision/assistance Equipment Recommended: Rolling walker with 5" wheels;3 in 1 bedside comode [facility] bed) PT Goals  Acute Rehab PT Goals PT Goal Formulation: With patient Time For Goal Achievement: 2 weeks Pt will go Supine/Side to Sit: with rail;with supervision;with cues (comment type and amount) PT Goal: Supine/Side to Sit - Progress: Other (comment) Pt will go Sit to Stand: with supervision;with cues (comment type and amount) PT Goal: Sit to Stand - Progress: Other (comment) Pt will Ambulate: 16 - 50 feet;with supervision;with least restrictive assistive device PT Goal: Ambulate - Progress: Other (comment) Pt will Go Up / Down Stairs: 3-5 stairs;with min assist;with least restrictive assistive device PT Goal: Up/Down Stairs - Progress: Other (comment) Pt will Perform Home Exercise Program: with min assist PT Goal: Perform Home Exercise Program - Progress: Other (comment)  PT Evaluation Precautions/Restrictions  Precautions Precautions: Fall Required Braces or Orthoses: No Restrictions Weight Bearing Restrictions: No Prior Functioning  Home Living Lives With: Spouse Receives Help From: Family (24 hour care) Type of Home: House  Home Layout: One level Home Access: Stairs to enter Entrance Stairs-Rails: Can reach both Entrance Stairs-Number of Steps: 5 Bathroom  Shower/Tub: Walk-in shower;Door [employer] Toilet: Standard Bathroom Accessibility: Yes How Accessible: Accessible via walker Home Adaptive Equipment: None Prior Function Level of Independence: Independent with basic ADLs;Independent with homemaking with ambulation;Independent with gait;Independent with transfers Driving: Yes Vocation: Retired Producer, television/film/video: Awake/alert Overall Cognitive Status: Appears within functional limits for tasks assessed Orientation Level: Oriented X4 Sensation/Coordination Sensation Light Touch: Impaired Detail Light Touch Impaired Details: Impaired RLE Stereognosis: Not tested Hot/Cold: Not tested Proprioception: Not tested Coordination Gross Motor Movements are Fluid and Coordinated: Yes Fine Motor Movements are Fluid and Coordinated: Yes Extremity Assessment RUE Assessment RUE Assessment: Within Functional Limits LUE Assessment LUE Assessment: Within Functional Limits RLE Assessment RLE Assessment: Exceptions to [facility] RLE Strength RLE Overall Strength: Deficits;Due to precautions;Due to pain LLE Assessment LLE Assessment: Within Functional Limits Mobility (including Balance) Bed Mobility Bed Mobility: Yes Rolling Left: 1: +2 Total assist;Patient percentage (comment) (pt = 60%) Rolling Left Details (indicate cue type and reason): Pt. wanting to pull up on PT/OT.  Needed cues for technique.   Left Sidelying to Sit: 1: +2 Total assist;Patient percentage (comment);HOB elevated (comment degrees) (pt/ = 60%; HOB elevated at 45 degrees) Left Sidelying to Sit Details (indicate cue type and reason): Pt continued to reach for PT/OT to pull on them to get OOB instead of using muscles or rail.. Sitting - Scoot to Edge of Bed: 3: Mod assist (with pad) Sitting - Scoot to Edge of Bed Details (indicate cue type and reason): Pt unable to reciprocal scoot evenwith cues Transfers Transfers: Yes Sit to Stand: 1: +2 Total assist;Patient  percentage (comment);From elevated surface;With upper extremity assist;From bed (pt = 50%) Sit to Stand Details (indicate cue type and reason): Pt. needed incr assist to perform sit to stand.  Once standing, pt. c/o cramp in right leg and sat back down.  PT assisted pt. with ROM exercises of right LE to decr. cramping. Upon, second stand, pt. did better tolerating standing position.   Stand to Sit: 1: +2 Total assist;Patient percentage (comment);With upper extremity assist;To chair/3-in-1;With armrests (pt = 70%) Stand to Sit Details: cues for hand placement Ambulation/Gait Ambulation/Gait: Yes Ambulation/Gait Assistance: 1: +2 Total assist;Patient percentage (comment) (pt = 70-85%) Ambulation/Gait Assistance Details (indicate cue type and reason): Pt initially needed manual assist to move right LE.  Once pt. took a few steps with assist at right LE, pt. able to advance right LE on his own. He ambulated to the bathroom, then to sink and then to chair.  15 x2. Ambulation Distance (Feet): 30 Feet Assistive device: Rolling walker Gait Pattern: Step-to pattern;Decreased stride length;Decreased weight shift to right;Shuffle;Lateral trunk lean to left;Trunk flexed Gait velocity: Slow cadence Wheelchair Mobility Wheelchair Mobility: No  Posture/Postural Control Posture/Postural Control: Postural limitations Postural Limitations: Flexed posture.   Balance Balance Assessed: No Exercise  General Exercises - Lower Extremity Ankle Circles/Pumps: AAROM;Right;10 reps;Seated (and hamstring stretch x 5 on right LE) End of Session PT - End of Session Equipment Utilized During Treatment: Gait belt Activity Tolerance: Patient limited by pain Patient left: in chair;with call bell in reach;with family/visitor present Nurse Communication: Mobility status for ambulation General Behavior During Session: [facility] for tasks performed Cognition: [facility] for tasks performed  INGOLD,Camrin Lapre 04/21/2011, 9:42 AM [facility] Acute Rehabilitation [phone] (pager)

## 2011-04-21 NOTE — Progress Notes (Signed)
[facility] [place]  Progress Note Bypass Surgery  Date of Surgery: 04/18/2011 Procedure: Procedure(s): BYPASS GRAFT FEMORAL-FEMORAL ARTERY BYPASS GRAFT RIGHT FEMORAL-POPLITEAL ARTERY Surgeon: Surgeon(s): Juleen China, MD POD : 3 Days Post-Op  History of Present Illness  Mathew Padilla is a 60 y.o. male who is S/P  Procedure(s): BYPASS GRAFT FEMORAL-FEMORAL ARTERY BYPASS GRAFT RIGHT FEMORAL-POPLITEAL ARTERY surgery.  The patient's wounds are healing well.   Patients pain is well controlled.  The patient's pre-operative symptoms of pain and numbness are Improved.  Significant Diagnostic Studies: CBC    Component Value Date/Time   WBC 13.0* 04/19/2011 0030   RBC 3.65* 04/19/2011 0030   HGB 11.6* 04/19/2011 0030   HCT 34.7* 04/19/2011 0030   PLT 141* 04/19/2011 0030   MCV 95.1 04/19/2011 0030   MCH 31.8 04/19/2011 0030   MCHC 33.4 04/19/2011 0030   RDW 14.1 04/19/2011 0030   LYMPHSABS 1.5 04/18/2011 0320   MONOABS 1.1* 04/18/2011 0320   EOSABS 0.1 04/18/2011 0320   BASOSABS 0.0 04/18/2011 0320    BMET    Component Value Date/Time   NA 133* 04/19/2011 0030   K 3.8 04/19/2011 0030   CL 101 04/19/2011 0030   CO2 23 04/19/2011 0030   GLUCOSE 178* 04/19/2011 0030   BUN 12 04/19/2011 0030   CREATININE 0.80 04/19/2011 0030   CALCIUM 7.8* 04/19/2011 0030   GFRNONAA >90 04/19/2011 0030   GFRAA >90 04/19/2011 0030    COAG Lab Results  Component Value Date   INR 1.03 04/18/2011   No results found for this basename: PTT    Physical Examination  BP Readings from Last 3 Encounters:  04/21/11 116/67  04/21/11 116/67  11/25/10 117/70   Temp Readings from Last 3 Encounters:  04/21/11 98.7 F (37.1 C) Oral  04/21/11 98.7 F (37.1 C) Oral  11/23/10 98.1 F (36.7 C) Oral   SpO2 Readings from Last 3 Encounters:  04/21/11 98%  04/21/11 98%  11/25/10 96%   Pt is A&O x 3 right lower extremity: Incision/s is/are clean,dry.intact, and   healing without hematoma, erythema or drainage Limb is warm; with good color  Assessment: Pt. Doing well Post-op pain is controlled Wounds are healing well Bypass is open and extremities are well perfused  Plan: PT/OT for ambulation Home today if has BM  [facility] J [phone] 04/21/2011 9:35 AM

## 2011-04-21 NOTE — Progress Notes (Signed)
Occupational Therapy Evaluation Patient Details Name: Mathew Padilla MRN: [id number] DOB: 1950/11/21 Today's Date: 04/21/2011  Problem List:  Patient Active Problem List  Diagnoses  . HYPERTENSION  . Arteriosclerotic cardiovascular disease (ASCVD)  . Laryngeal carcinoma  . AAA (abdominal aortic aneurysm)  . Diverticulitis  . Obesity  . Gastroesophageal reflux disease  . Sleep apnea  . Tobacco abuse, in remission  . Cerebrovascular disease  . Low back pain  . Pneumonia  . Cholelithiasis  . Pulmonary infiltrate    Past Medical History:  Past Medical History  Diagnosis Date  . Arteriosclerotic cardiovascular disease (ASCVD) 2000 he    PTCA of PDA and 2nd marginal in 3/00  . Hypertension     Mild; controlled with a single agent  . AAA (abdominal aortic aneurysm) 2007    stent graft repair 12/07  . Laryngeal carcinoma 2010    resection and radiation therapy 2010-11  . Diverticulitis 2006  . Obesity     BMI 47; lap band surgery 2010  . Gastroesophageal reflux disease     Hiatal hernia; previously treated with PPI  . Sleep apnea     BiPAP mask utilized   . Tobacco abuse, in remission     40 pack years; discontinued in 2000  . Cerebrovascular disease     carotid stent  . Cholelithiasis 11/28/2010   Past Surgical History:  Past Surgical History  Procedure Date  . Peripheral arterial stent graft 2007    for abdominal aortic aneurysm  . Larynx surgery 2010    Resection of carcinoma  . Open anterior shoulder reconstruction 2011    Left  . Laparoscopic gastric banding 2010  . Eye surgery   . Cataract extraction   . Colonoscopy 10/2007    OT Assessment/Plan/Recommendation OT Assessment Clinical Impression Statement: Pt. will benefit from acute OT to increase functional independence with ADLs and get pt. to min assist level at D/C OT Recommendation/Assessment: Patient will need skilled OT in the acute care venue OT Problem List: Decreased strength;Decreased activity  tolerance;Impaired balance (sitting and/or standing);Decreased safety awareness;Decreased knowledge of use of DME or AE Barriers to Discharge: None OT Therapy Diagnosis : Generalized weakness;Acute pain OT Plan OT Frequency: Min 2X/week OT Treatment/Interventions: Self-care/ADL training;DME and/or AE instruction;Therapeutic activities;Patient/family education;Balance training OT Recommendation Follow Up Recommendations: Home health OT Equipment Recommended: Rolling walker with 5" wheels;3 in 1 bedside comode Individuals Consulted Consulted and Agree with Results and Recommendations: Patient OT Goals Acute Rehab OT Goals OT Goal Formulation: With patient/family Time For Goal Achievement: 2 weeks ADL Goals Pt Will Perform Grooming: with modified independence;Standing at sink ADL Goal: Grooming - Progress: Progressing toward goals Pt Will Perform Lower Body Bathing: with set-up;Sit to stand from bed (with AE) ADL Goal: Lower Body Bathing - Progress: Progressing toward goals Pt Will Perform Lower Body Dressing: with set-up;with supervision;Sit to stand from bed;with adaptive equipment ADL Goal: Lower Body Dressing - Progress: Progressing toward goals Pt Will Transfer to Toilet: with supervision;3-in-1;Stand pivot transfer ADL Goal: Toilet Transfer - Progress: Progressing toward goals Pt Will Perform Toileting - Hygiene: with modified independence;Standing at 3-in-1/toilet ADL Goal: Toileting - Hygiene - Progress: Progressing toward goals  OT Evaluation Precautions/Restrictions  Precautions Precautions: Fall Required Braces or Orthoses: No Restrictions Weight Bearing Restrictions: No Prior Functioning Home Living Lives With: Spouse Receives Help From: Family Type of Home: House Home Layout: One level Home Access: Stairs to enter Entrance Stairs-Rails: Can reach both Entrance Stairs-Number of Steps: 5 Bathroom Shower/Tub: Walk-in shower;Door [employer]  Toilet: Standard Bathroom  Accessibility: Yes How Accessible: Accessible via walker Home Adaptive Equipment: None Prior Function Level of Independence: Independent with basic ADLs;Independent with homemaking with ambulation;Independent with gait;Independent with transfers Driving: Yes Vocation: Retired ADL ADL Eating/Feeding: Simulated;Independent Where Assessed - Eating/Feeding: Chair Grooming: Performed;Wash/dry hands;Minimal assistance;Set up Grooming Details (indicate cue type and reason): Min assist for balance and min verbal cues for safety with RW Where Assessed - Grooming: Standing at sink Upper Body Bathing: Simulated;Right arm;Chest;Left arm;Abdomen;Minimal assistance Where Assessed - Upper Body Bathing: Sitting, chair Lower Body Bathing: Simulated;Maximal assistance Where Assessed - Lower Body Bathing: Sit to stand from chair Upper Body Dressing: Performed;Minimal assistance Upper Body Dressing Details (indicate cue type and reason): With donning gown Where Assessed - Upper Body Dressing: Sitting, chair Lower Body Dressing: +1 Total assistance;Performed Lower Body Dressing Details (indicate cue type and reason): With donning bilateral socks Where Assessed - Lower Body Dressing: Sitting, chair Toilet Transfer: +2 Total assistance;Comment for patient %;Performed (pt=60%) Toilet Transfer Details (indicate cue type and reason): Max verbal cues for hand placement on RW and on grab bar Toilet Transfer Method: Proofreader: Regular height toilet;Grab bars Toileting - Clothing Manipulation: Simulated;Set up;Minimal assistance Toileting - Clothing Manipulation Details (indicate cue type and reason): min assist for balance Where Assessed - Toileting Clothing Manipulation: Standing Toileting - Hygiene: Performed;Set up;Minimal assistance Toileting - Hygiene Details (indicate cue type and reason): assist for balance Where Assessed - Toileting Hygiene: Standing Tub/Shower Transfer: Not  assessed Tub/Shower Transfer Method: Not assessed Equipment Used: Rolling walker ADL Comments: Pt. provided with min assist with ambulation in room bed-bathroom-chair and mod verbal cues throughout for safety with RW. Vision/Perception  Vision - History Baseline Vision: No visual deficits Patient Visual Report: No change from baseline Vision - Assessment Eye Alignment: Within Functional Limits Vision Assessment: Vision not tested Cognition Cognition Arousal/Alertness: Awake/alert Overall Cognitive Status: Appears within functional limits for tasks assessed Orientation Level: Oriented X4 Sensation/Coordination Sensation Light Touch: Impaired Detail Light Touch Impaired Details: Impaired RLE Stereognosis: Not tested Hot/Cold: Not tested Proprioception: Not tested Coordination Gross Motor Movements are Fluid and Coordinated: Yes Fine Motor Movements are Fluid and Coordinated: Yes Extremity Assessment RUE Assessment RUE Assessment: Within Functional Limits LUE Assessment LUE Assessment: Within Functional Limits Mobility  Bed Mobility Bed Mobility: Yes Rolling Left: 1: +2 Total assist;Patient percentage (comment) (pt=60%) Rolling Left Details (indicate cue type and reason): Pt. provided with mod verbal cues for transfer technique and hand placement. Left Sidelying to Sit: 1: +2 Total assist;Patient percentage (comment);HOB elevated (comment degrees) (pt=60%) Left Sidelying to Sit Details (indicate cue type and reason): Pt continued to reach for PT/OT to pull on them to get OOB instead of using muscles or rail.. Sitting - Scoot to Edge of Bed: 3: Mod assist Sitting - Scoot to Edge of Bed Details (indicate cue type and reason): Manual facilitation at hips to facilitate reciprocal scooting EOB Transfers Transfers: Yes Sit to Stand: 1: +2 Total assist;Patient percentage (comment);From elevated surface;With upper extremity assist;From bed (pt=50%) Sit to Stand Details (indicate cue  type and reason): Pt. needed incr assist to perform sit to stand.  Once standing, pt. c/o cramp in right leg and sat back down.  PT assisted pt. with ROM exercises of right LE to decr. cramping. Upon, second stand, pt. did better tolerating standing position.   Stand to Sit: 1: +2 Total assist;Patient percentage (comment);With upper extremity assist;To chair/3-in-1;With armrests (pt = 70%) Stand to Sit Details: cues for hand placement Exercises  General Exercises - Lower Extremity Ankle Circles/Pumps: AAROM;Right;10 reps;Seated (and hamstring stretch x 5 on right LE) End of Session OT - End of Session Equipment Utilized During Treatment: Gait belt Activity Tolerance: Patient tolerated treatment well Patient left: in chair;with call bell in reach Nurse Communication: Mobility status for transfers General Behavior During Session: [facility] for tasks performed Cognition: [facility] for tasks performed   Legacy Lacivita,OTR/L Pager [phone] 04/21/2011, 12:42 PM

## 2011-04-22 NOTE — Discharge Summary (Signed)
Agree with above  Durene Cal, M.D.

## 2011-04-24 ENCOUNTER — Encounter (HOSPITAL_COMMUNITY): Payer: Self-pay | Admitting: Surgery

## 2011-04-26 ENCOUNTER — Encounter (HOSPITAL_COMMUNITY): Payer: Self-pay | Admitting: *Deleted

## 2011-04-26 ENCOUNTER — Observation Stay (HOSPITAL_COMMUNITY)
Admission: EM | Admit: 2011-04-26 | Discharge: 2011-04-27 | DRG: 921 | Disposition: A | Discharge status: Home / Self Care | Payer: Medicare Other | Attending: Vascular Surgery | Admitting: Vascular Surgery

## 2011-04-26 DIAGNOSIS — I724 Aneurysm of artery of lower extremity: Secondary | ICD-10-CM | POA: Insufficient documentation

## 2011-04-26 DIAGNOSIS — M79609 Pain in unspecified limb: Principal | ICD-10-CM | POA: Insufficient documentation

## 2011-04-26 DIAGNOSIS — I10 Essential (primary) hypertension: Secondary | ICD-10-CM | POA: Insufficient documentation

## 2011-04-26 DIAGNOSIS — E669 Obesity, unspecified: Secondary | ICD-10-CM | POA: Insufficient documentation

## 2011-04-26 DIAGNOSIS — M79606 Pain in leg, unspecified: Secondary | ICD-10-CM

## 2011-04-26 DIAGNOSIS — I739 Peripheral vascular disease, unspecified: Secondary | ICD-10-CM

## 2011-04-26 DIAGNOSIS — Z8521 Personal history of malignant neoplasm of larynx: Secondary | ICD-10-CM | POA: Insufficient documentation

## 2011-04-26 DIAGNOSIS — Z87891 Personal history of nicotine dependence: Secondary | ICD-10-CM | POA: Insufficient documentation

## 2011-04-26 DIAGNOSIS — IMO0002 Reserved for concepts with insufficient information to code with codable children: Secondary | ICD-10-CM | POA: Insufficient documentation

## 2011-04-26 DIAGNOSIS — Z9884 Bariatric surgery status: Secondary | ICD-10-CM | POA: Insufficient documentation

## 2011-04-26 DIAGNOSIS — Y832 Surgical operation with anastomosis, bypass or graft as the cause of abnormal reaction of the patient, or of later complication, without mention of misadventure at the time of the procedure: Secondary | ICD-10-CM | POA: Insufficient documentation

## 2011-04-26 NOTE — ED Notes (Signed)
He had some type  Surgery on his rt leg last Saturday.  The incision has started draining tonight.  No pain

## 2011-04-27 ENCOUNTER — Encounter (HOSPITAL_COMMUNITY): Payer: Self-pay | Admitting: Radiology

## 2011-04-27 ENCOUNTER — Emergency Department (HOSPITAL_COMMUNITY): Payer: Medicare Other

## 2011-04-27 LAB — CBC
Hemoglobin: 11.9 g/dL — ABNORMAL LOW (ref 13.0–17.0)
Hemoglobin: 11.2 g/dL — ABNORMAL LOW (ref 13.0–17.0)
MCH: 31.8 pg (ref 26.0–34.0)
Platelets: 208 10*3/uL (ref 150–400)
RBC: 3.71 MIL/uL — ABNORMAL LOW (ref 4.22–5.81)
RBC: 3.52 MIL/uL — ABNORMAL LOW (ref 4.22–5.81)
WBC: 9.4 10*3/uL (ref 4.0–10.5)
WBC: 8 10*3/uL (ref 4.0–10.5)

## 2011-04-27 LAB — POCT I-STAT, CHEM 8
Creatinine, Ser: 0.8 mg/dL (ref 0.50–1.35)
HCT: 37 % — ABNORMAL LOW (ref 39.0–52.0)
Hemoglobin: 12.6 g/dL — ABNORMAL LOW (ref 13.0–17.0)
Potassium: 4.3 mEq/L (ref 3.5–5.1)
Sodium: 136 mEq/L (ref 135–145)
TCO2: 29 mmol/L (ref 0–100)

## 2011-04-27 LAB — DIFFERENTIAL
Lymphs Abs: 1.7 10*3/uL (ref 0.7–4.0)
Monocytes Relative: 10 % (ref 3–12)
Neutro Abs: 6.5 10*3/uL (ref 1.7–7.7)
Neutrophils Relative %: 70 % (ref 43–77)

## 2011-04-27 LAB — CREATININE, SERUM
Creatinine, Ser: 0.72 mg/dL (ref 0.50–1.35)
GFR calc Af Amer: >90 mL/min (ref >90)
GFR calc non Af Amer: >90 mL/min (ref >90)

## 2011-04-27 MED ORDER — ACETAMINOPHEN 325 MG PO TABS: 325.0000 mg | ORAL_TABLET | ORAL | Status: DC | PRN

## 2011-04-27 MED ORDER — SENNA 8.6 MG PO TABS
2.0000 | ORAL_TABLET | Freq: Every day | ORAL | Status: DC
  Filled 2011-04-27: qty 2

## 2011-04-27 MED ORDER — TRIAZOLAM 0.25 MG PO TABS: 0.2500 mg | ORAL_TABLET | Freq: Every evening | ORAL | Status: DC | PRN

## 2011-04-27 MED ORDER — TAMSULOSIN HCL 0.4 MG PO CAPS: 0.4000 mg | ORAL_CAPSULE | Freq: Every day | ORAL | Status: DC

## 2011-04-27 MED ORDER — IOHEXOL 350 MG/ML SOLN
200.0000 mL | Freq: Once | INTRAVENOUS | Status: AC | PRN
  Administered 2011-04-27: 200 mL via INTRAVENOUS

## 2011-04-27 MED ORDER — ONDANSETRON HCL 4 MG/2ML IJ SOLN: 4.0000 mg | Freq: Four times a day (QID) | INTRAMUSCULAR | Status: DC | PRN

## 2011-04-27 MED ORDER — HYDRALAZINE HCL 20 MG/ML IJ SOLN: 10.0000 mg | INTRAMUSCULAR | Status: DC | PRN

## 2011-04-27 MED ORDER — ACETAMINOPHEN 650 MG RE SUPP: 325.0000 mg | RECTAL | Status: DC | PRN

## 2011-04-27 MED ORDER — CEPHALEXIN 500 MG PO CAPS: 500.0000 mg | ORAL_CAPSULE | Freq: Two times a day (BID) | ORAL | Status: AC

## 2011-04-27 MED ORDER — LISINOPRIL 40 MG PO TABS
40.0000 mg | ORAL_TABLET | Freq: Every day | ORAL | Status: DC
  Filled 2011-04-27: qty 1

## 2011-04-27 MED ORDER — ASPIRIN EC 81 MG PO TBEC: 81.0000 mg | DELAYED_RELEASE_TABLET | Freq: Every day | ORAL | Status: DC

## 2011-04-27 MED ORDER — ALUM & MAG HYDROXIDE-SIMETH 200-200-20 MG/5ML PO SUSP: 15.0000 mL | ORAL | Status: DC | PRN

## 2011-04-27 MED ORDER — POTASSIUM CHLORIDE CRYS ER 20 MEQ PO TBCR: 20.0000 meq | EXTENDED_RELEASE_TABLET | Freq: Two times a day (BID) | ORAL | Status: DC

## 2011-04-27 MED ORDER — OXYCODONE HCL 5 MG PO TABS: 5.0000 mg | ORAL_TABLET | ORAL | Status: DC | PRN

## 2011-04-27 MED ORDER — POTASSIUM CHLORIDE CRYS ER 20 MEQ PO TBCR
20.0000 meq | EXTENDED_RELEASE_TABLET | Freq: Once | ORAL | Status: AC
  Administered 2011-04-27: 20 meq via ORAL
  Filled 2011-04-27: qty 1

## 2011-04-27 MED ORDER — FAMOTIDINE IN NACL 20-0.9 MG/50ML-% IV SOLN
20.0000 mg | Freq: Two times a day (BID) | INTRAVENOUS | Status: DC
  Filled 2011-04-27 (×2): qty 50

## 2011-04-27 MED ORDER — BISACODYL 10 MG RE SUPP: 10.0000 mg | Freq: Every day | RECTAL | Status: DC | PRN

## 2011-04-27 MED ORDER — ASPIRIN EC 81 MG PO TBEC
81.0000 mg | DELAYED_RELEASE_TABLET | Freq: Every day | ORAL | Status: DC
  Filled 2011-04-27: qty 1

## 2011-04-27 MED ORDER — ENOXAPARIN SODIUM 40 MG/0.4ML ~~LOC~~ SOLN
40.0000 mg | Freq: Every day | SUBCUTANEOUS | Status: DC
  Filled 2011-04-27: qty 0.4

## 2011-04-27 MED ORDER — TAMSULOSIN HCL 0.4 MG PO CAPS
0.4000 mg | ORAL_CAPSULE | Freq: Every day | ORAL | Status: DC
  Filled 2011-04-27: qty 1

## 2011-04-27 MED ORDER — MORPHINE SULFATE 2 MG/ML IJ SOLN: 2.0000 mg | INTRAMUSCULAR | Status: DC | PRN

## 2011-04-27 MED ORDER — SENNA 8.6 MG PO TABS: 2.0000 | ORAL_TABLET | Freq: Every day | ORAL | Status: DC

## 2011-04-27 MED ORDER — METOPROLOL TARTRATE 1 MG/ML IV SOLN: 2.0000 mg | INTRAVENOUS | Status: DC | PRN

## 2011-04-27 MED ORDER — GUAIFENESIN-DM 100-10 MG/5ML PO SYRP: 15.0000 mL | ORAL_SOLUTION | ORAL | Status: DC | PRN

## 2011-04-27 MED ORDER — PHENOL 1.4 % MT LIQD: 1.0000 | OROMUCOSAL | Status: DC | PRN

## 2011-04-27 MED ORDER — DOCUSATE SODIUM 100 MG PO CAPS: 100.0000 mg | ORAL_CAPSULE | Freq: Two times a day (BID) | ORAL | Status: DC

## 2011-04-27 MED ORDER — DEXTROSE-NACL 5-0.45 % IV SOLN
INTRAVENOUS | Status: DC
  Administered 2011-04-27: 04:00:00 via INTRAVENOUS

## 2011-04-27 MED ORDER — PANTOPRAZOLE SODIUM 40 MG PO TBEC: 40.0000 mg | DELAYED_RELEASE_TABLET | Freq: Every day | ORAL | Status: DC

## 2011-04-27 MED ORDER — LABETALOL HCL 5 MG/ML IV SOLN: 10.0000 mg | INTRAVENOUS | Status: DC | PRN

## 2011-04-27 NOTE — ED Provider Notes (Signed)
Medical screening examination/treatment/procedure(s) were performed by non-physician practitioner and as supervising physician I was immediately available for consultation/collaboration.   Anwar Sakata W Kamela Blansett, MD 04/27/11 0743 

## 2011-04-27 NOTE — H&P (Signed)
[facility] [place] HISTORY AND PHYSICAL    History of Present Illness:  Patient is a 60 y.o.61 y.o. year old male who presents with drainage from his right below knee incision and some tingling in his right foot. He underwent Fem-Fem bypass with Right Fem pop with PTFE by Dr Myra Gianotti 12/21.  He had previously undergone Aortic Stent graft with a Cook Zenith in 2007.  He denies fever or chills.  He has had intermittent episodes of drainage of pink tinged fluid from his below knee incision intermittently all evening.  He has also had some tingling in his right foot but this is different from the prior ischemic episode he had. The foot has continued to remain warm and he has had no motor deficits.  Past Medical History  Diagnosis Date  . Arteriosclerotic cardiovascular disease (ASCVD) 2000 he    PTCA of PDA and 2nd marginal in 3/00  . Hypertension     Mild; controlled with a single agent  . AAA (abdominal aortic aneurysm) 2007    stent graft repair 12/07  . Laryngeal carcinoma 2010    resection and radiation therapy 2010-11  . Diverticulitis 2006  . Obesity     BMI 47; lap band surgery 2010  . Gastroesophageal reflux disease     Hiatal hernia; previously treated with PPI  . Sleep apnea     BiPAP mask utilized   . Tobacco abuse, in remission     40 pack years; discontinued in 2000  . Cerebrovascular disease     carotid stent  . Cholelithiasis 11/28/2010     Past Surgical History  Procedure Date  . Peripheral arterial stent graft 2007    for abdominal aortic aneurysm  . Larynx surgery 2010    Resection of carcinoma  . Open anterior shoulder reconstruction 2011    Left  . Laparoscopic gastric banding 2010  . Eye surgery   . Cataract extraction   . Colonoscopy 10/2007  . Femoral-femoral bypass graft 04/18/2011    Procedure: BYPASS GRAFT FEMORAL-FEMORAL ARTERY;  Surgeon: Juleen China, MD;  Location: [facility] OR;  Service: Vascular;  Laterality: Bilateral;  Left to  right femoral to femoral bypass   . Femoral-popliteal bypass graft 04/18/2011    Procedure: BYPASS GRAFT FEMORAL-POPLITEAL ARTERY;  Surgeon: Juleen China, MD;  Location: [facility] OR;  Service: Vascular;  Laterality: Right;  Right femoral popliteal bypass with propaten gortex graft       Review of Systems:  Neurologic: denies symptoms of TIA, amaurosis, or stroke Cardiac:denies shortness of breath or chest pain Pulmonary: denies cough or wheeze Abdomen: denies abdominal pain nausea or vomiting  History   Social History  . Marital Status: Married    Spouse Name: N/A    Number of Children: N/A  . Years of Education: N/A   Occupational History  . Not on file.   Social History Main Topics  . Smoking status: Former Smoker    Quit date: 11/26/1997  . Smokeless tobacco: Never Used   Comment: 40 pack years; quit 2000  . Alcohol Use: No  . Drug Use: No  . Sexually Active: Yes   Other Topics Concern  . Not on file   Social History Narrative   Married, past employed, does not get regular exercise.     Allergies  Allergen Reactions  . Statins Hives and Rash    No current facility-administered medications on file prior to encounter.   Current Outpatient Prescriptions on File Prior  to Encounter  Medication Sig Dispense Refill  . lisinopril (PRINIVIL,ZESTRIL) 40 MG tablet Take 40 mg by mouth daily.        Marland Kitchen omeprazole (PRILOSEC) 20 MG capsule Take 20 mg by mouth daily.       . potassium chloride (K-DUR,KLOR-CON) 10 MEQ tablet Take 20 mEq by mouth 2 (two) times daily.             Physical Examination    Filed Vitals:   04/26/11 2342  BP: 142/68  Pulse: 97  Temp: 98.2 F (36.8 C)  TempSrc: Oral  Resp: 20  SpO2: 98%     General:  Alert and oriented, no acute distress HEENT: Normal Neck: No bruit or JVD Pulmonary: Clear to auscultation bilaterally Cardiac: Regular Rate and Rhythm without murmur Abdomen: Soft, non-tender, non-distended, normal bowel sounds, no  pulsatile mass, obese Extremities: 2+ femoral pulses, healing groin incisions without erythema or drainage, feet symmetric temp., right foot monophasic peroneal PT DP, below knee incision without erythema, wet skin but no active drainage  ASSESSMENT:  Draining wound right below knee incision most likely seroma, numbness and tingling in foot will need to rule out graft occlusion   PLAN: CT angio with runoff now Admit for observation Baseline labs  Fabienne Bruns, MD [facility] [place] Office: [phone] Pager: [phone]

## 2011-04-27 NOTE — Discharge Summary (Signed)
[facility] Discharge Summary  Mathew Padilla 12-Feb-1951 60 y.o. male  [id number]  Admission Date: 04/26/2011  Discharge Date: 04/27/11  Physician: Sherren Kerns, MD  Admission Diagnosis: Lower extremity pain [729.5] Post-op bleeding [998.11] open incision    HPI:   This is a 60 y.o. male who presents with drainage from his right below knee incision and some tingling in his right foot. He underwent Fem-Fem bypass with Right Fem pop with PTFE by Dr Myra Gianotti 12/21. He had previously undergone Aortic Stent graft with a Cook Zenith in 2007. He denies fever or chills. He has had intermittent episodes of drainage of pink tinged fluid from his below knee incision intermittently all evening. He has also had some tingling in his right foot but this is different from the prior ischemic episode he had. The foot has continued to remain warm and he has had no motor deficits.     Hospital Course:  The patient was admitted to the hospital and and underwent a CTA with runoff to evalulagte graft patency .  The CTA revealed:  IMPRESSION:  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 Interval placement of a femoral to  femoral bypass graft which appears patent. Interval placement of a  right femoral to distal popliteal bypass graft which appears  patent. Two-vessel runoff is suggested to the right foot. 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 Vessels do appear patent with three-vessel  runoff to the left foot. Infiltrative changes and small fluid  collections around the femoral-femoral bypass graft probably  representing postoperative fluid although infection is not  excluded. Soft tissue gas and infiltration medial to the right  knee may represent postoperative change or infection. No specific  aneurysm demonstrated at the knee.   The pt's graft is patent and the pt is  discharged home with 10 days of prophylactic ABx (Keflex 500 mg po bid).  The remainder of the hospital course consisted of increasing ambulation and increasing intake of solids without difficulty.    Basename 04/27/11 0110  NA 136  K 4.3  CL 99  CO2 --  GLUCOSE 100*  BUN 18  CALCIUM --    Basename 04/27/11 0110 04/27/11 0044  WBC -- 9.4  HGB 12.6* 11.9*  HCT 37.0* 35.4*  PLT -- 233    Discharge Instructions:   The patient is discharged to home with extensive instructions on wound care and progressive ambulation.  They are instructed not to drive or perform any heavy lifting until returning to see the physician in his office.  Discharge Orders    Future Appointments: Provider: Department: Dept Phone: Center:   05/12/2011 12:30 PM [facility] [phone] [facility]   05/12/2011 1:00 PM [facility] [phone] [facility]   05/12/2011 1:30 PM V Durene Cal, MD [facility] [phone] [facility]   07/25/2011 9:30 AM [facility] [phone] [facility]     Future Orders Please Complete By Expires   Resume previous diet      Call MD for:  temperature >100.5      Call MD for:  redness, tenderness, or signs of infection (pain, swelling, bleeding, redness, odor or green/yellow discharge around incision site)      Call MD for:  severe or increased pain, loss or decreased feeling  in affected limb(s)      Discharge instructions      Comments:   Resume previous  discharge instructions      Discharge Diagnosis:  Lower extremity pain [729.5] Post-op bleeding [998.11] open incision   Secondary Diagnosis: Patient Active Problem List  Diagnoses  . HYPERTENSION  . Arteriosclerotic cardiovascular disease (ASCVD)  . Laryngeal carcinoma  . AAA (abdominal aortic aneurysm)  . Diverticulitis  . Obesity  . Gastroesophageal reflux disease  . Sleep apnea  . Tobacco abuse, in remission  . Cerebrovascular disease  . Low back pain  . Pneumonia  .  Cholelithiasis  . Pulmonary infiltrate   Past Medical History  Diagnosis Date  . Arteriosclerotic cardiovascular disease (ASCVD) 2000 he    PTCA of PDA and 2nd marginal in 3/00  . Hypertension     Mild; controlled with a single agent  . AAA (abdominal aortic aneurysm) 2007    stent graft repair 12/07  . Laryngeal carcinoma 2010    resection and radiation therapy 2010-11  . Diverticulitis 2006  . Obesity     BMI 47; lap band surgery 2010  . Gastroesophageal reflux disease     Hiatal hernia; previously treated with PPI  . Sleep apnea     BiPAP mask utilized   . Tobacco abuse, in remission     40 pack years; discontinued in 2000  . Cerebrovascular disease     carotid stent  . Cholelithiasis 11/28/2010       Mathew Padilla, Mathew Padilla  Home Medication Instructions [id number]   Printed on:04/27/11 [phone]  Medication Information                    lisinopril (PRINIVIL,ZESTRIL) 40 MG tablet Take 40 mg by mouth daily.             omeprazole (PRILOSEC) 20 MG capsule Take 20 mg by mouth daily.            potassium chloride (K-DUR,KLOR-CON) 10 MEQ tablet Take 20 mEq by mouth 2 (two) times daily.             oxyCODONE-acetaminophen (PERCOCET) 5-325 MG per tablet Take 1-2 tablets by mouth every 4 (four) hours as needed. For pain            bisacodyl (DULCOLAX) 10 MG suppository Place 10 mg rectally daily as needed. For constipation            senna (SENOKOT) 8.6 MG TABS Take 2 tablets by mouth at bedtime.             triazolam (HALCION) 0.25 MG tablet Take 0.25 mg by mouth at bedtime as needed. For sleep            Tamsulosin HCl (FLOMAX) 0.4 MG CAPS Take 0.4 mg by mouth daily after supper.             aspirin EC 81 MG tablet Take 81 mg by mouth daily.             cephALEXin (KEFLEX) 500 MG capsule Take 1 capsule (500 mg total) by mouth 2 (two) times daily.             Disposition: home  Patient's condition: is Good  Follow up:  Dr. Myra Gianotti one week.   Newton Pigg, PA-C [facility] [phone] 04/27/2011  8:23 AM

## 2011-04-27 NOTE — Progress Notes (Addendum)
[facility] Progress Note  04/27/2011 8:11 AM POD 9  No complaints.  Wants to eat.  Tm 99.1  98% RA Filed Vitals:   04/27/11 0336  BP: 132/83  Pulse: 85  Temp: 99.1 F (37.3 C)  Resp: 17    Incisions:  All incisions are c/d/i.  RLE distal incision with minimal drainage this am. Extremities:  Warm, well perfused.  CBC    Component Value Date/Time   WBC 9.4 04/27/2011 0044   RBC 3.71* 04/27/2011 0044   HGB 12.6* 04/27/2011 0110   HCT 37.0* 04/27/2011 0110   PLT 233 04/27/2011 0044   MCV 95.4 04/27/2011 0044   MCH 32.1 04/27/2011 0044   MCHC 33.6 04/27/2011 0044   RDW 13.8 04/27/2011 0044   LYMPHSABS 1.7 04/27/2011 0044   MONOABS 0.9 04/27/2011 0044   EOSABS 0.2 04/27/2011 0044   BASOSABS 0.0 04/27/2011 0044    BMET    Component Value Date/Time   NA 136 04/27/2011 0110   K 4.3 04/27/2011 0110   CL 99 04/27/2011 0110   CO2 23 04/19/2011 0030   GLUCOSE 100* 04/27/2011 0110   BUN 18 04/27/2011 0110   CREATININE 0.80 04/27/2011 0110   CALCIUM 7.8* 04/19/2011 0030   GFRNONAA >90 04/19/2011 0030   GFRAA >90 04/19/2011 0030    INR    Component Value Date/Time   INR 1.03 04/18/2011 0320   CTA 04/27/11 IMPRESSION:  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 Interval placement of a femoral to  femoral bypass graft which appears patent. Interval placement of a  right femoral to distal popliteal bypass graft which appears  patent. Two-vessel runoff is suggested to the right foot. 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 Vessels do appear patent with three-vessel  runoff to the left foot. Infiltrative changes and small fluid  collections around the femoral-femoral bypass graft probably  representing postoperative fluid although infection is not  excluded. Soft tissue gas and infiltration medial to the right  knee may represent postoperative  change or infection. No specific  aneurysm demonstrated at the knee.    Assessment/Plan:  60 y.o. male is s/p Fem-Fem bypass and right fem-pop with PTFE by Dr. Myra Gianotti 04/18/11 POD 9 -CTA with r/o reveals the graft is patent.  There is fluid collection, but given the pt is afebrile, there is a normal WBC, and the pt surgery was recent, it is probably not infection. -will d/c home today on prophylactic ABx -- Keflex 500 mg po bid x 10 days. -f/u with Dr. Myra Gianotti two weeks -pt knows to call the office if he starts to see purulent drainage or has fever over 101.   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  Fem fem fem pop aortic stent graft patent Exam details as above Minimize activity, dry dressing right calf  Fabienne Bruns, MD [facility] [place] Office: [phone] Pager: [phone]

## 2011-04-27 NOTE — ED Notes (Signed)
Report given to Delight on floor.

## 2011-04-27 NOTE — ED Notes (Signed)
Patient reports wound to right calf is leaking dressing intact also reports wounds to groin left and right and right upper leg 4 wounds total. Spoke with Dr Darrick Penna and he is coming to see patient in the ED

## 2011-04-27 NOTE — ED Notes (Signed)
Patient transported to CT 

## 2011-04-27 NOTE — Progress Notes (Signed)
04/27/2011 10:18 AM Nursing Note: Discharge AVS, RX, medications already taken today and those due this pm discussed with patient and wife. Incision care and when to call the MD also discussed. D/c iv, d/c tele. D/c home per orders.

## 2011-04-27 NOTE — ED Provider Notes (Signed)
History     CSN: [id number]  Arrival date & time 04/26/11  2336   First MD Initiated Contact with Patient 04/27/11 0012      Chief Complaint  Patient presents with  . Wound Check     HPI  History provided by the patient and spouse. Patient is a 60 year old male with peripheral artery disease, AAA, obesity who presents with concerns of drainage from right lower leg incisions secondary to arterial bypass surgery last week by Dr. Darrick Penna. Patient reports history of bilateral groin stents for PAD. Last week he began to have stenosis with pain and loss of pulse in right lower leg. Patient went in for surgery for a bypass around right lower leg and in bilateral groins. Patient states he had been doing well and was having increased mobility today but began noticing small amounts of oozing and bleeding from his right lower leg incision. Patient also reports having some decreased temperature in right foot. He also is reports occasional tingling to foot. Patient denies any fever, chills, sweats, erythema of the skin.    Past Medical History  Diagnosis Date  . Arteriosclerotic cardiovascular disease (ASCVD) 2000 he    PTCA of PDA and 2nd marginal in 3/00  . Hypertension     Mild; controlled with a single agent  . AAA (abdominal aortic aneurysm) 2007    stent graft repair 12/07  . Laryngeal carcinoma 2010    resection and radiation therapy 2010-11  . Diverticulitis 2006  . Obesity     BMI 47; lap band surgery 2010  . Gastroesophageal reflux disease     Hiatal hernia; previously treated with PPI  . Sleep apnea     BiPAP mask utilized   . Tobacco abuse, in remission     40 pack years; discontinued in 2000  . Cerebrovascular disease     carotid stent  . Cholelithiasis 11/28/2010    Past Surgical History  Procedure Date  . Peripheral arterial stent graft 2007    for abdominal aortic aneurysm  . Larynx surgery 2010    Resection of carcinoma  . Open anterior shoulder reconstruction 2011     Left  . Laparoscopic gastric banding 2010  . Eye surgery   . Cataract extraction   . Colonoscopy 10/2007  . Femoral-femoral bypass graft 04/18/2011    Procedure: BYPASS GRAFT FEMORAL-FEMORAL ARTERY;  Surgeon: Juleen China, MD;  Location: [facility] OR;  Service: Vascular;  Laterality: Bilateral;  Left to right femoral to femoral bypass   . Femoral-popliteal bypass graft 04/18/2011    Procedure: BYPASS GRAFT FEMORAL-POPLITEAL ARTERY;  Surgeon: Juleen China, MD;  Location: [facility] OR;  Service: Vascular;  Laterality: Right;  Right femoral popliteal bypass with propaten gortex graft    Family History  Problem Relation Age of Onset  . Heart attack Father     deceased  . Diabetes Father   . Heart failure Father   . Hyperlipidemia Father   . Hypertension Father   . Lung cancer Mother     deceased   . Hypertension Mother   . Hyperlipidemia Mother   . Heart failure Mother   . Diabetes Mother   . Cancer Sister     History  Substance Use Topics  . Smoking status: Former Smoker    Quit date: 11/26/1997  . Smokeless tobacco: Never Used   Comment: 40 pack years; quit 2000  . Alcohol Use: No      Review of Systems  Constitutional: Negative for fever,  chills and appetite change.  Gastrointestinal: Negative for nausea and vomiting.  All other systems reviewed and are negative.    Allergies  Statins  Home Medications   Current Outpatient Rx  Name Route Sig Dispense Refill  . ASPIRIN EC 81 MG PO TBEC Oral Take 81 mg by mouth daily.      Marland Kitchen LISINOPRIL 40 MG PO TABS Oral Take 40 mg by mouth daily.      Marland Kitchen OMEPRAZOLE 20 MG PO CPDR Oral Take 20 mg by mouth daily.     . OXYCODONE-ACETAMINOPHEN 5-325 MG PO TABS Oral Take 1-2 tablets by mouth every 4 (four) hours as needed. For pain     . POTASSIUM CHLORIDE CRYS CR 10 MEQ PO TBCR Oral Take 20 mEq by mouth 2 (two) times daily.      Marland Kitchen TAMSULOSIN HCL 0.4 MG PO CAPS Oral Take 0.4 mg by mouth daily after supper.      Marland Kitchen TRIAZOLAM 0.25 MG PO  TABS Oral Take 0.25 mg by mouth at bedtime as needed. For sleep     . BISACODYL 10 MG RE SUPP Rectal Place 10 mg rectally daily as needed. For constipation     . SENNA 8.6 MG PO TABS Oral Take 2 tablets by mouth at bedtime.        BP 142/68  Pulse 97  Temp(Src) 98.2 F (36.8 C) (Oral)  Resp 20  SpO2 98%  Physical Exam  Nursing note and vitals reviewed. Constitutional: He is oriented to person, place, and time. He appears well-developed and well-nourished.  HENT:  Head: Normocephalic.  Cardiovascular: Normal rate and regular rhythm.   Pulmonary/Chest: Effort normal and breath sounds normal.  Abdominal: Soft. Bowel sounds are normal. There is no tenderness.       Obese  Musculoskeletal: Normal range of motion.       Right lower leg and foot are cool to the touch compared to left. he has weak but present right dorsal pedal pulse and cap refill less than 2 seconds. Dorsal pedal pulse confirmed by Doppler.  Neurological: He is alert and oriented to person, place, and time.  Skin:       Recent incisions over bilateral groins , along the medial right proximal thigh, and medial right lower leg. Wounds appeared closed with surgical glue with some peeling of glue. There is small amount of serosanguineous discharge from right lower wound. There is no erythema or induration of the skin. There is no increased warmth around the incisions. There is no signs for complete dehiscence.  Psychiatric: He has a normal mood and affect. His behavior is normal.    ED Course  Procedures (including critical care time)   Labs Reviewed  CBC  DIFFERENTIAL  I-STAT, CHEM 8     1. Lower extremity pain   2. Post-op bleeding       MDM  12:30 AM patient seen and evaluated. Patient in no acute distress.   The nurse informed me that Dr. Darrick Penna has called and will be coming to see patient. He would like patient moved to an exam room. Patient will be moved to the yellow side in room 20.   Dr. Darrick Penna has  seen patient and will admit for observation with further testing.  Angus Seller, PA 04/27/11 [phone]

## 2011-05-09 ENCOUNTER — Encounter: Payer: Self-pay | Admitting: Surgery

## 2011-05-12 ENCOUNTER — Other Ambulatory Visit (INDEPENDENT_AMBULATORY_CARE_PROVIDER_SITE_OTHER): Payer: Medicare Other | Admitting: *Deleted

## 2011-05-12 ENCOUNTER — Ambulatory Visit (INDEPENDENT_AMBULATORY_CARE_PROVIDER_SITE_OTHER): Payer: Medicare Other | Admitting: Surgery

## 2011-05-12 ENCOUNTER — Ambulatory Visit: Payer: Medicare Other | Admitting: Surgery

## 2011-05-12 ENCOUNTER — Encounter: Payer: Self-pay | Admitting: Surgery

## 2011-05-12 ENCOUNTER — Other Ambulatory Visit: Payer: Medicare Other

## 2011-05-12 VITALS — BP 117/74 | HR 96 | Resp 16 | Ht 71.0 in | Wt 314.0 lb

## 2011-05-12 DIAGNOSIS — I724 Aneurysm of artery of lower extremity: Secondary | ICD-10-CM

## 2011-05-12 DIAGNOSIS — Z48812 Encounter for surgical aftercare following surgery on the circulatory system: Secondary | ICD-10-CM

## 2011-05-12 DIAGNOSIS — I739 Peripheral vascular disease, unspecified: Secondary | ICD-10-CM | POA: Insufficient documentation

## 2011-05-12 NOTE — Progress Notes (Signed)
Patient comes in today for followup. He is status post endovascular aneurysm repair in the remote past by Dr. Darrick Penna. He recently presented to the hospital with an ischemic right leg. On December 21 he underwent femoral-femoral bypass grafting and a femoral to below knee popliteal artery bypass graft with Gore-Tex. 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 The patient has done well postoperatively. He was seen in emergency department and admitted for drainage from the right leg. A repeat CT scan was done which showed no flow within the right popliteal aneurysm and a left popliteal aneurysm measuring approximately 2.6-3.2 cm. The patient's today for followup. He is recently been recovering from a head cold.   Both groin incisions are healing nicely. The below knee incision on the right leg is also healing. His smile amount of edema in both legs.  Duplex ultrasounds performed today the ABI on the right is 1.1 on the left is 1.0.  Patient is recovering nicely. I am scheduling him to come in to see Dr. fields in 3 weeks for evaluation of repair of his left popliteal aneurysm. I'll get a vein mapping prior to his visit.

## 2011-06-02 NOTE — Procedures (Unsigned)
BYPASS GRAFT EVALUATION  INDICATION:  Followup evaluation of right lower extremity.  HISTORY: Diabetes:  No Cardiac:  MI, PTA and stent 1990 Hypertension:  Yes Smoking:  Previous Previous Surgery:  AAA repair 04/16/2006, femoral to femoral artery bypass graft; femoral to popliteal artery  bypass graft 04/18/2011  SINGLE LEVEL ARTERIAL EXAM                              RIGHT              LEFT Brachial: Anterior tibial: Posterior tibial: Peroneal: Ankle/brachial index:        1.16               1.01  PREVIOUS ABI:  Date:  04/16/2006  RIGHT:  1.04  LEFT:  1.18  LOWER EXTREMITY BYPASS GRAFT DUPLEX EXAM:  DUPLEX:  Imaging reveals right popliteal aneurysm measuring 2.45 x 2.87 cm with a residual lumen of approximately 0.72 cm.  IMPRESSION:  A 2.45 x 2.87 cm right popliteal aneurysm visualized.  ___________________________________________ V. Charlena Cross, MD  SS/MEDQ  D:  05/13/2011  T:  05/13/2011  Job:  [id number]

## 2011-06-04 ENCOUNTER — Encounter: Payer: Self-pay | Admitting: Vascular Surgery

## 2011-06-05 ENCOUNTER — Ambulatory Visit (INDEPENDENT_AMBULATORY_CARE_PROVIDER_SITE_OTHER): Payer: Medicare Other | Admitting: Vascular Surgery

## 2011-06-05 ENCOUNTER — Ambulatory Visit (INDEPENDENT_AMBULATORY_CARE_PROVIDER_SITE_OTHER): Payer: Medicare Other | Admitting: *Deleted

## 2011-06-05 ENCOUNTER — Encounter: Payer: Self-pay | Admitting: Vascular Surgery

## 2011-06-05 VITALS — BP 142/81 | HR 81 | Resp 22 | Ht 71.0 in | Wt 310.0 lb

## 2011-06-05 DIAGNOSIS — I724 Aneurysm of artery of lower extremity: Secondary | ICD-10-CM

## 2011-06-05 DIAGNOSIS — I70219 Atherosclerosis of native arteries of extremities with intermittent claudication, unspecified extremity: Secondary | ICD-10-CM

## 2011-06-05 DIAGNOSIS — Z01810 Encounter for preprocedural cardiovascular examination: Secondary | ICD-10-CM

## 2011-06-05 NOTE — Progress Notes (Signed)
Patient returns for followup today. He previously had a fem-fem bypass performed on December 21 by Dr. Myra Gianotti. He also had a right femoral to below-knee popliteal bypass with Gore-Tex the same time for thrombosed popliteal aneurysm. He returns today for consideration of repair of his left popliteal aneurysm. However he still has some difficulty healing of the right groin incision. He has some occasional bloody drainage from the corner of this. Otherwise he is doing well.  Physical exam:  Filed Vitals:   06/05/11 1545  BP: 142/81  Pulse: 81  Resp: 22  Height: 5\' 11"  (1.803 m)  Weight: 310 lb (140.615 kg)  SpO2: 97%   Right groin incision has a 2 cm opening at the lateral aspect with good granulation tissue at the base the wound is less than 2 mm in depth  Feet are pink warm and well-perfused bilaterally. He had a saphenous vein mapping procedure today which still shows a small fluid collection in the left groin 2 x 4 cm. The saphenous vein is greater than 4 mm in diameter throughout its course in the left leg and greater than 4 mm throughout most of its course in the right leg  Assessment: Healing groin incision right leg post fem-fem and right femoropopliteal bypass. Known left popliteal aneurysm which needs repair. He will followup in one month. If his groin incision is well-healed at that point. We will consider repairing his left popliteal aneurysm. He has adequate saphenous vein for this.  Fabienne Bruns, MD [facility] Office: [phone] Pager: [phone]

## 2011-06-16 ENCOUNTER — Other Ambulatory Visit (HOSPITAL_COMMUNITY): Payer: Self-pay | Admitting: Thoracic Diseases

## 2011-06-20 NOTE — Procedures (Unsigned)
VASCULAR LAB EXAM  INDICATION:  Preoperative vein mapping for left popliteal aneurysm repair  HISTORY: Diabetes:  No Cardiac:  MI, PTA, stent 1990 Hypertension:  Yes  EXAM:  Bilateral lower extremity GSV mapping  IMPRESSION: 1. Patent right and left great saphenous veins with diameter     measurements shown on the following worksheet. 2. Incidental finding of a left groin hematoma measuring 2.21 x 3.86     cm.  ___________________________________________ Janetta Hora. Fields, MD  EM/MEDQ  D:  06/06/2011  T:  06/06/2011  Job:  [id number]

## 2011-06-27 DIAGNOSIS — R972 Elevated prostate specific antigen [PSA]: Secondary | ICD-10-CM

## 2011-06-27 HISTORY — DX: Elevated prostate specific antigen (PSA): R97.20

## 2011-07-01 ENCOUNTER — Telehealth (INDEPENDENT_AMBULATORY_CARE_PROVIDER_SITE_OTHER): Payer: Self-pay | Admitting: Surgery

## 2011-07-01 NOTE — Telephone Encounter (Signed)
07/01/11 mailed recall letter for bariatric surgery f/u to patient. Adv pt to call [facility] @ [phone] to schedule an appointment....cef 

## 2011-07-10 ENCOUNTER — Other Ambulatory Visit: Payer: Self-pay | Admitting: *Deleted

## 2011-07-10 DIAGNOSIS — I714 Abdominal aortic aneurysm, without rupture: Secondary | ICD-10-CM

## 2011-07-16 ENCOUNTER — Encounter: Payer: Self-pay | Admitting: Vascular Surgery

## 2011-07-17 ENCOUNTER — Ambulatory Visit: Payer: Medicare Other | Admitting: Vascular Surgery

## 2011-07-17 ENCOUNTER — Other Ambulatory Visit: Payer: Self-pay | Admitting: *Deleted

## 2011-07-17 ENCOUNTER — Encounter: Payer: Self-pay | Admitting: Neurosurgery

## 2011-07-17 ENCOUNTER — Ambulatory Visit (INDEPENDENT_AMBULATORY_CARE_PROVIDER_SITE_OTHER): Payer: Medicare Other | Admitting: Neurosurgery

## 2011-07-17 ENCOUNTER — Encounter (INDEPENDENT_AMBULATORY_CARE_PROVIDER_SITE_OTHER): Payer: Medicare Other | Admitting: *Deleted

## 2011-07-17 ENCOUNTER — Encounter: Payer: Self-pay | Admitting: *Deleted

## 2011-07-17 VITALS — BP 139/88 | HR 84 | Resp 16 | Ht 71.0 in | Wt 319.3 lb

## 2011-07-17 DIAGNOSIS — I714 Abdominal aortic aneurysm, without rupture, unspecified: Secondary | ICD-10-CM | POA: Insufficient documentation

## 2011-07-17 NOTE — Progress Notes (Addendum)
[facility] [place] HISTORY AND PHYSICAL -PAD CC: Dr. Darrick Penna Referring Physician:Fields  History of Present Illness  Mathew Padilla is a 61 y.o. male patient who presents with chief complaint of left lower extremity PAD. Pt. denies rest pain; reports night pain denies non healing ulcers on left lower extremity. He previously had a fem-fem bypass performed on December 21 by Dr. Myra Gianotti. He also had a right femoral to below-knee popliteal bypass with Gore-Tex the same time for thrombosed popliteal aneurysm.    Pt has had previous intervention of Right Vascular Bypass .  Past Medical History  Diagnosis Date  . Arteriosclerotic cardiovascular disease (ASCVD) 2000 he    PTCA of PDA and 2nd marginal in 3/00  . Hypertension     Mild; controlled with a single agent  . AAA (abdominal aortic aneurysm) 2007    stent graft repair 12/07  . Laryngeal carcinoma 2010    resection and radiation therapy 2010-11  . Diverticulitis 2006  . Obesity     BMI 47; lap band surgery 2010  . Gastroesophageal reflux disease     Hiatal hernia; previously treated with PPI  . Sleep apnea     BiPAP mask utilized   . Tobacco abuse, in remission     40 pack years; discontinued in 2000  . Cerebrovascular disease     carotid stent  . Cholelithiasis 11/28/2010  . Elevated PSA 06/2011    Social History History  Substance Use Topics  . Smoking status: Former Smoker    Quit date: 11/26/1997  . Smokeless tobacco: Never Used   Comment: 40 pack years; quit 2000  . Alcohol Use: No    Family History Family History  Problem Relation Age of Onset  . Heart attack Father     deceased  . Heart failure Father   . Hyperlipidemia Father   . Hypertension Father   . Heart disease Father   . Lung cancer Mother     deceased   . Hypertension Mother   . Hyperlipidemia Mother   . Heart failure Mother   . Diabetes Mother   . Cancer Sister     vaginal  . Diabetes Sister     ROS: [x]   Positive   [ ]  Denies  General:[ ]  Weight loss,  [ ]  Weight gain, [ ]  Fever, [ ]  chills Neurologic: [ ]  Dizziness, [ ]  Blackouts, [ ]  Seizure [ ]  Stroke, [ ]  "Mini stroke", [ ]  Slurred speech, [ ]  Temporary blindness;  [ ] weakness, [ ]  Hoarseness Cardiac: [ ]  Chest pain/pressure, [ ]  Shortness of breath at rest [ ]  Shortness of breath with exertion,  [ ]   Atrial fibrillation or irregular heartbeat Vascular:[x ] Pain in legs with walking, [ ]  Pain in legs at rest ,[ ]  Pain in legs at night,  [ ]   Non-healing ulcer, [ ]  Blood clot in vein/DVT,   Pulmonary: [ ]  Home oxygen, [ ]   Productive cough, [ ]  Coughing up blood,  [ ]  Asthma,  [ ]  Wheezing Musculoskeletal:  [ ]  Arthritis, [ ]  Low back pain,  [ ]  Joint pain Hematologic:[ ]  Easy Bruising, [ ]  Anemia; [ ]  Hepatitis Gastrointestinal: [ ]  Blood in stool,  [ ]  Gastroesophageal Reflux, [ ]  Trouble swallowing Urinary: [ ]  chronic Kidney disease, [ ]  on HD, [ ]  Burning with urination, [ ]  Frequent urination, [ ]  Difficulty urinating;  Skin: [ ]  Rashes, [ ]  Wounds    Allergies  Allergen Reactions  .  Aspirin     Daily dose upsets stomach  . Statins Hives and Rash    Current Outpatient Prescriptions  Medication Sig Dispense Refill  . aspirin EC 81 MG tablet Take 81 mg by mouth daily.        Marland Kitchen lisinopril (PRINIVIL,ZESTRIL) 40 MG tablet Take 40 mg by mouth daily.        Marland Kitchen omeprazole (PRILOSEC) 20 MG capsule Take 20 mg by mouth daily.       . potassium chloride (K-DUR,KLOR-CON) 10 MEQ tablet Take by mouth 2 (two) times daily.       Marland Kitchen senna (SENOKOT) 8.6 MG TABS Take 2 tablets by mouth at bedtime.        . Tamsulosin HCl (FLOMAX) 0.4 MG CAPS Take 0.4 mg by mouth daily after supper.        . cephALEXin (KEFLEX) 500 MG capsule Take 500 mg by mouth 2 (two) times daily.      Marland Kitchen oxyCODONE-acetaminophen (PERCOCET) 5-325 MG per tablet Take 1 tablet by mouth every 4 (four) hours as needed.        Physical Examination  Filed Vitals:   07/17/11 0944    BP: 139/88  Pulse: 84  Resp: 16    General: A&O x 3, WDWN,  Gait: normal Eyes: PERRLA, Pulmonary: CTAB, without wheezes , rales or rhonchi Cardiac: regular Rythm , without murmur                                                                  RIGHT                                       LEFT  CAROTID BRUIT    VASCULAR EXAM: Extremities with ischemic changes in the lower extremities bilaterally. without Gangrene, open wounds;drainage.                                                                                                    LOWER EXTREMITY PULSES           RIGHT                                      LEFT      FEMORAL  present and palpable  present and palpable        POPLITEAL  present and palpable   present and palpable       POSTERIOR TIBIAL  absent and palpable   absent and palpable        DORSALIS PEDIS      ANTERIOR TIBIAL biphasic by Doppler and palpable   biphasic by Doppler and palpable        PERONEAL    {  Abdomen: soft, NT, no masses Skin: no rashes, ulcers noted Musculoskeletal: no muscle wasting or atrophy  Neurologic: A&O X 3; Appropriate Affect ; SENSATION: normal; MOTOR FUNCTION:  moving all extremities equally. Speech is fluent/normal    Non-Invasive Vascular Imaging: DATE: 07/17/2011 AAA SAC 3.7,previous 06/2010- 3.7    ASSESSMENT: Mathew Padilla is a 61 y.o. male who presents with: left lower PAD WITH a known popliteal aneurysm which Dr. Darrick Penna plans to bypass March 27.Marland Kitchen   PLAN: Based on the patient's vascular studies and examination, patient will undergo a left popliteal bypass graft with Dr. Darrick Penna 07/23/2011 and will return to clinic per Dr. Darrick Penna discharge instructions after the procedure.  Lauree Chandler ANP  Clinic MD: Fields  Pt with prior stent graft for AAA.  Thrombosed one limb requiring fem fem bypass.  Recent PTFE right fem pop by Dr Myra Gianotti for popliteal aneurysm   Feet cool bilat but biphasic to triphasic doppler.  Has  known left popliteal aneurysm and adequate vein.  Will plan above to below knee bypass next week.  Risk/benefit/ procedure details discussed.  Fabienne Bruns, MD [facility] [place] Office: [phone] Pager: [phone]

## 2011-07-18 ENCOUNTER — Encounter (HOSPITAL_COMMUNITY): Payer: Self-pay | Admitting: Pharmacy Technician

## 2011-07-21 ENCOUNTER — Encounter (HOSPITAL_COMMUNITY)
Admission: RE | Admit: 2011-07-21 | Discharge: 2011-07-21 | Disposition: A | Discharge status: Home / Self Care | Payer: Medicare Other | Source: Ambulatory Visit | Attending: Vascular Surgery | Admitting: Vascular Surgery

## 2011-07-21 ENCOUNTER — Ambulatory Visit (HOSPITAL_COMMUNITY)
Admission: RE | Admit: 2011-07-21 | Discharge: 2011-07-21 | Disposition: A | Discharge status: Home / Self Care | Payer: Medicare Other | Source: Ambulatory Visit | Attending: Anesthesiology | Admitting: Anesthesiology

## 2011-07-21 ENCOUNTER — Encounter (HOSPITAL_COMMUNITY): Payer: Self-pay

## 2011-07-21 DIAGNOSIS — E669 Obesity, unspecified: Secondary | ICD-10-CM | POA: Insufficient documentation

## 2011-07-21 DIAGNOSIS — Z01812 Encounter for preprocedural laboratory examination: Secondary | ICD-10-CM | POA: Insufficient documentation

## 2011-07-21 DIAGNOSIS — Z01818 Encounter for other preprocedural examination: Secondary | ICD-10-CM | POA: Insufficient documentation

## 2011-07-21 HISTORY — DX: Acute myocardial infarction, unspecified: I21.9

## 2011-07-21 HISTORY — DX: Cough: R05

## 2011-07-21 HISTORY — DX: Chronic cough: R05.3

## 2011-07-21 LAB — COMPREHENSIVE METABOLIC PANEL
Albumin: 3.3 g/dL — ABNORMAL LOW (ref 3.5–5.2)
Alkaline Phosphatase: 75 U/L (ref 39–117)
BUN: 9 mg/dL (ref 6–23)
CO2: 20 mEq/L (ref 19–32)
Chloride: 102 mEq/L (ref 96–112)
Potassium: 4 mEq/L (ref 3.5–5.1)
Total Bilirubin: 0.2 mg/dL — ABNORMAL LOW (ref 0.3–1.2)

## 2011-07-21 LAB — URINALYSIS, ROUTINE W REFLEX MICROSCOPIC
Bilirubin Urine: NEGATIVE
Hgb urine dipstick: NEGATIVE
Specific Gravity, Urine: 1.015 (ref 1.005–1.030)
pH: 5.5 (ref 5.0–8.0)

## 2011-07-21 LAB — CBC
HCT: 41.4 % (ref 39.0–52.0)
Hemoglobin: 13.7 g/dL (ref 13.0–17.0)
RBC: 4.62 MIL/uL (ref 4.22–5.81)
RDW: 13.5 % (ref 11.5–15.5)
WBC: 4.9 10*3/uL (ref 4.0–10.5)

## 2011-07-21 LAB — TYPE AND SCREEN: Antibody Screen: NEGATIVE

## 2011-07-21 LAB — DIFFERENTIAL
Basophils Relative: 0 % (ref 0–1)
Eosinophils Absolute: 0.2 10*3/uL (ref 0.0–0.7)
Monocytes Absolute: 0.5 10*3/uL (ref 0.1–1.0)
Monocytes Relative: 10 % (ref 3–12)
Neutrophils Relative %: 65 % (ref 43–77)

## 2011-07-21 LAB — BLOOD GAS, ARTERIAL
Bicarbonate: 22.9 mEq/L (ref 20.0–24.0)
TCO2: 24 mmol/L (ref 0–100)
pCO2 arterial: 35.6 mmHg (ref 35.0–45.0)
pH, Arterial: 7.423 (ref 7.350–7.450)
pO2, Arterial: 86.1 mmHg (ref 80.0–100.0)

## 2011-07-21 LAB — PROTIME-INR: INR: 1.06 (ref 0.00–1.49)

## 2011-07-21 MED ORDER — SODIUM CHLORIDE 0.9 % IV SOLN: INTRAVENOUS | Status: DC

## 2011-07-21 NOTE — Progress Notes (Signed)
Echo in epic  09

## 2011-07-21 NOTE — Progress Notes (Signed)
Dr Andrey Campanile called for sleep study. Dr Dietrich Pates called for stress test. Normal per md report in epic.

## 2011-07-21 NOTE — Pre-Procedure Instructions (Signed)
20 DERREK PUFF  07/21/2011   Your procedure is scheduled on:  07/23/11  Report to [facility] at 630 AM.  Call this number if you have problems the morning of surgery: [phone]   Remember:   Do not eat food:After Midnight.  May have clear liquids: up to 4 Hours before arrival.  Clear liquids include soda, tea, black coffee, apple or grape juice, broth.  Take these medicines the morning of surgery with A SIP OF WATER: prilosec,flomax   Do not wear jewelry, make-up or nail polish.  Do not wear lotions, powders, or perfumes. You may wear deodorant.  Do not shave 48 hours prior to surgery.  Do not bring valuables to the hospital.  Contacts, dentures or bridgework may not be worn into surgery.  Leave suitcase in the car. After surgery it may be brought to your room.  For patients admitted to the hospital, checkout time is 11:00 AM the day of discharge.   Patients discharged the day of surgery will not be allowed to drive home.  Name and phone number of your driver: family  Special Instructions: CHG Shower Use Special Wash: 1/2 bottle night before surgery and 1/2 bottle morning of surgery.   Please read over the following fact sheets that you were given: Pain Booklet, Coughing and Deep Breathing, Blood Transfusion Information, MRSA Information and Surgical Site Infection Prevention

## 2011-07-22 MED ORDER — DEXTROSE 5 % IV SOLN
1.5000 g | INTRAVENOUS | Status: AC
  Administered 2011-07-23: 1.5 g via INTRAVENOUS
  Filled 2011-07-22: qty 1.5

## 2011-07-23 ENCOUNTER — Encounter (HOSPITAL_COMMUNITY): Payer: Self-pay | Admitting: *Deleted

## 2011-07-23 ENCOUNTER — Encounter (HOSPITAL_COMMUNITY)
Admission: RE | Disposition: A | Discharge status: Home / Self Care | Payer: Self-pay | Source: Ambulatory Visit | Attending: Vascular Surgery

## 2011-07-23 ENCOUNTER — Inpatient Hospital Stay (HOSPITAL_COMMUNITY)
Admission: RE | Admit: 2011-07-23 | Discharge: 2011-07-25 | DRG: 238 | Disposition: A | Discharge status: Home / Self Care | Payer: Medicare Other | Source: Ambulatory Visit | Attending: Vascular Surgery | Admitting: Vascular Surgery

## 2011-07-23 ENCOUNTER — Ambulatory Visit (HOSPITAL_COMMUNITY): Payer: Medicare Other | Admitting: *Deleted

## 2011-07-23 DIAGNOSIS — Z8521 Personal history of malignant neoplasm of larynx: Secondary | ICD-10-CM

## 2011-07-23 DIAGNOSIS — I10 Essential (primary) hypertension: Secondary | ICD-10-CM | POA: Diagnosis present

## 2011-07-23 DIAGNOSIS — G4730 Sleep apnea, unspecified: Secondary | ICD-10-CM | POA: Diagnosis present

## 2011-07-23 DIAGNOSIS — Z7982 Long term (current) use of aspirin: Secondary | ICD-10-CM

## 2011-07-23 DIAGNOSIS — I2510 Atherosclerotic heart disease of native coronary artery without angina pectoris: Secondary | ICD-10-CM | POA: Diagnosis present

## 2011-07-23 DIAGNOSIS — I724 Aneurysm of artery of lower extremity: Secondary | ICD-10-CM

## 2011-07-23 DIAGNOSIS — I739 Peripheral vascular disease, unspecified: Secondary | ICD-10-CM

## 2011-07-23 DIAGNOSIS — K449 Diaphragmatic hernia without obstruction or gangrene: Secondary | ICD-10-CM | POA: Diagnosis present

## 2011-07-23 DIAGNOSIS — Z87891 Personal history of nicotine dependence: Secondary | ICD-10-CM

## 2011-07-23 DIAGNOSIS — E669 Obesity, unspecified: Secondary | ICD-10-CM | POA: Diagnosis present

## 2011-07-23 DIAGNOSIS — Z6841 Body Mass Index (BMI) 40.0 and over, adult: Secondary | ICD-10-CM

## 2011-07-23 DIAGNOSIS — I70209 Unspecified atherosclerosis of native arteries of extremities, unspecified extremity: Secondary | ICD-10-CM | POA: Diagnosis present

## 2011-07-23 DIAGNOSIS — K219 Gastro-esophageal reflux disease without esophagitis: Secondary | ICD-10-CM | POA: Diagnosis present

## 2011-07-23 DIAGNOSIS — I252 Old myocardial infarction: Secondary | ICD-10-CM

## 2011-07-23 DIAGNOSIS — Z79899 Other long term (current) drug therapy: Secondary | ICD-10-CM

## 2011-07-23 DIAGNOSIS — Z9861 Coronary angioplasty status: Secondary | ICD-10-CM

## 2011-07-23 HISTORY — PX: PR VEIN BYPASS GRAFT,AORTO-FEM-POP: 35551

## 2011-07-23 LAB — CBC
Platelets: 158 10*3/uL (ref 150–400)
RBC: 4.44 MIL/uL (ref 4.22–5.81)
RDW: 13.6 % (ref 11.5–15.5)
WBC: 11.7 10*3/uL — ABNORMAL HIGH (ref 4.0–10.5)

## 2011-07-23 LAB — CREATININE, SERUM
Creatinine, Ser: 0.8 mg/dL (ref 0.50–1.35)
GFR calc Af Amer: >90 mL/min (ref >90)
GFR calc non Af Amer: >90 mL/min (ref >90)

## 2011-07-23 SURGERY — CREATION, BYPASS, ARTERIAL, POPLITEAL
Anesthesia: General | Site: Leg Lower | Laterality: Left | Wound class: Clean

## 2011-07-23 MED ORDER — FAMOTIDINE IN NACL 20-0.9 MG/50ML-% IV SOLN
20.0000 mg | Freq: Two times a day (BID) | INTRAVENOUS | Status: DC
  Administered 2011-07-23 – 2011-07-25 (×4): 20 mg via INTRAVENOUS
  Filled 2011-07-23 (×6): qty 50

## 2011-07-23 MED ORDER — FENTANYL CITRATE 0.05 MG/ML IJ SOLN
INTRAMUSCULAR | Status: DC | PRN
  Administered 2011-07-23: 50 ug via INTRAVENOUS
  Administered 2011-07-23: 150 ug via INTRAVENOUS
  Administered 2011-07-23 (×6): 50 ug via INTRAVENOUS

## 2011-07-23 MED ORDER — LISINOPRIL 40 MG PO TABS
40.0000 mg | ORAL_TABLET | Freq: Every day | ORAL | Status: DC
  Administered 2011-07-23 – 2011-07-25 (×3): 40 mg via ORAL
  Filled 2011-07-23 (×3): qty 1

## 2011-07-23 MED ORDER — ACETAMINOPHEN 325 MG PO TABS: 325.0000 mg | ORAL_TABLET | ORAL | Status: DC | PRN

## 2011-07-23 MED ORDER — PANTOPRAZOLE SODIUM 40 MG PO TBEC
40.0000 mg | DELAYED_RELEASE_TABLET | Freq: Every day | ORAL | Status: DC
Start: 2011-07-24 — End: 2011-07-25
  Administered 2011-07-24 – 2011-07-25 (×2): 40 mg via ORAL
  Filled 2011-07-23 (×2): qty 1

## 2011-07-23 MED ORDER — HEPARIN SODIUM (PORCINE) 1000 UNIT/ML IJ SOLN
INTRAMUSCULAR | Status: DC | PRN
  Administered 2011-07-23: 15000 [IU] via INTRAVENOUS

## 2011-07-23 MED ORDER — SODIUM CHLORIDE 0.9 % IR SOLN
Status: DC | PRN
  Administered 2011-07-23: 10:00:00

## 2011-07-23 MED ORDER — POTASSIUM CHLORIDE CRYS ER 10 MEQ PO TBCR
10.0000 meq | EXTENDED_RELEASE_TABLET | Freq: Two times a day (BID) | ORAL | Status: DC
  Administered 2011-07-23 – 2011-07-25 (×4): 10 meq via ORAL
  Filled 2011-07-23 (×5): qty 1

## 2011-07-23 MED ORDER — SODIUM CHLORIDE 0.9 % IV SOLN: 500.0000 mL | Freq: Once | INTRAVENOUS | Status: AC | PRN

## 2011-07-23 MED ORDER — PROPOFOL 10 MG/ML IV EMUL
INTRAVENOUS | Status: DC | PRN
  Administered 2011-07-23: 200 mg via INTRAVENOUS

## 2011-07-23 MED ORDER — ONDANSETRON HCL 4 MG/2ML IJ SOLN: 4.0000 mg | Freq: Four times a day (QID) | INTRAMUSCULAR | Status: DC | PRN

## 2011-07-23 MED ORDER — GLYCOPYRROLATE 0.2 MG/ML IJ SOLN
INTRAMUSCULAR | Status: DC | PRN
  Administered 2011-07-23: .5 mg via INTRAVENOUS

## 2011-07-23 MED ORDER — PHENOL 1.4 % MT LIQD
1.0000 | OROMUCOSAL | Status: DC | PRN
  Administered 2011-07-23: 1 via OROMUCOSAL
  Filled 2011-07-23: qty 177

## 2011-07-23 MED ORDER — DEXTROSE 5 % IV SOLN
1.5000 g | Freq: Two times a day (BID) | INTRAVENOUS | Status: AC
  Administered 2011-07-23 – 2011-07-24 (×2): 1.5 g via INTRAVENOUS
  Filled 2011-07-23 (×2): qty 1.5

## 2011-07-23 MED ORDER — DEXAMETHASONE SODIUM PHOSPHATE 4 MG/ML IJ SOLN
INTRAMUSCULAR | Status: DC | PRN
  Administered 2011-07-23: 8 mg via INTRAVENOUS

## 2011-07-23 MED ORDER — TRIAZOLAM 0.25 MG PO TABS
0.2500 mg | ORAL_TABLET | Freq: Every evening | ORAL | Status: DC | PRN
  Administered 2011-07-23: 0.25 mg via ORAL
  Filled 2011-07-23: qty 1

## 2011-07-23 MED ORDER — DOPAMINE-DEXTROSE 3.2-5 MG/ML-% IV SOLN: 3.0000 ug/kg/min | INTRAVENOUS | Status: DC

## 2011-07-23 MED ORDER — ASPIRIN EC 81 MG PO TBEC
81.0000 mg | DELAYED_RELEASE_TABLET | ORAL | Status: DC
  Administered 2011-07-24: 81 mg via ORAL
  Filled 2011-07-23: qty 1

## 2011-07-23 MED ORDER — MORPHINE SULFATE 10 MG/ML IJ SOLN
INTRAMUSCULAR | Status: DC | PRN
  Administered 2011-07-23: 1 mg via INTRAVENOUS
  Administered 2011-07-23: 4 mg via INTRAVENOUS

## 2011-07-23 MED ORDER — HYDROMORPHONE HCL PF 1 MG/ML IJ SOLN: 0.2500 mg | INTRAMUSCULAR | Status: DC | PRN

## 2011-07-23 MED ORDER — NEOSTIGMINE METHYLSULFATE 1 MG/ML IJ SOLN
INTRAMUSCULAR | Status: DC | PRN
  Administered 2011-07-23: 3 mg via INTRAVENOUS

## 2011-07-23 MED ORDER — ACETAMINOPHEN 10 MG/ML IV SOLN
INTRAVENOUS | Status: DC | PRN
  Administered 2011-07-23: 1000 mg via INTRAVENOUS

## 2011-07-23 MED ORDER — ONDANSETRON HCL 4 MG/2ML IJ SOLN
INTRAMUSCULAR | Status: DC | PRN
  Administered 2011-07-23: 4 mg via INTRAVENOUS

## 2011-07-23 MED ORDER — HYDRALAZINE HCL 20 MG/ML IJ SOLN
10.0000 mg | INTRAMUSCULAR | Status: DC | PRN
  Filled 2011-07-23: qty 0.5

## 2011-07-23 MED ORDER — TAMSULOSIN HCL 0.4 MG PO CAPS
0.4000 mg | ORAL_CAPSULE | Freq: Every day | ORAL | Status: DC
  Administered 2011-07-23 – 2011-07-24 (×2): 0.4 mg via ORAL
  Filled 2011-07-23 (×3): qty 1

## 2011-07-23 MED ORDER — SUCCINYLCHOLINE CHLORIDE 20 MG/ML IJ SOLN
INTRAMUSCULAR | Status: DC | PRN
  Administered 2011-07-23: 140 mg via INTRAVENOUS

## 2011-07-23 MED ORDER — KCL IN DEXTROSE-NACL 20-5-0.45 MEQ/L-%-% IV SOLN
INTRAVENOUS | Status: DC
  Administered 2011-07-23: 17:00:00 via INTRAVENOUS
  Administered 2011-07-24: 75 mL/h via INTRAVENOUS
  Filled 2011-07-23 (×5): qty 1000

## 2011-07-23 MED ORDER — EPHEDRINE SULFATE 50 MG/ML IJ SOLN
INTRAMUSCULAR | Status: DC | PRN
  Administered 2011-07-23: 10 mg via INTRAVENOUS

## 2011-07-23 MED ORDER — LABETALOL HCL 5 MG/ML IV SOLN
10.0000 mg | INTRAVENOUS | Status: DC | PRN
  Filled 2011-07-23: qty 4

## 2011-07-23 MED ORDER — OXYCODONE HCL 5 MG PO TABS
5.0000 mg | ORAL_TABLET | ORAL | Status: DC | PRN
  Administered 2011-07-23 – 2011-07-25 (×4): 10 mg via ORAL
  Filled 2011-07-23 (×5): qty 2

## 2011-07-23 MED ORDER — 0.9 % SODIUM CHLORIDE (POUR BTL) OPTIME
TOPICAL | Status: DC | PRN
  Administered 2011-07-23: 1000 mL

## 2011-07-23 MED ORDER — ENOXAPARIN SODIUM 40 MG/0.4ML ~~LOC~~ SOLN
40.0000 mg | SUBCUTANEOUS | Status: DC
  Administered 2011-07-24 – 2011-07-25 (×2): 40 mg via SUBCUTANEOUS
  Filled 2011-07-23 (×2): qty 0.4

## 2011-07-23 MED ORDER — LACTATED RINGERS IV SOLN
INTRAVENOUS | Status: DC | PRN
  Administered 2011-07-23 (×3): via INTRAVENOUS

## 2011-07-23 MED ORDER — ACETAMINOPHEN 650 MG RE SUPP: 325.0000 mg | RECTAL | Status: DC | PRN

## 2011-07-23 MED ORDER — METOPROLOL TARTRATE 1 MG/ML IV SOLN: 2.0000 mg | INTRAVENOUS | Status: DC | PRN

## 2011-07-23 MED ORDER — MIDAZOLAM HCL 5 MG/5ML IJ SOLN
INTRAMUSCULAR | Status: DC | PRN
  Administered 2011-07-23 (×4): 1 mg via INTRAVENOUS

## 2011-07-23 MED ORDER — GUAIFENESIN-DM 100-10 MG/5ML PO SYRP
15.0000 mL | ORAL_SOLUTION | ORAL | Status: DC | PRN
  Administered 2011-07-24 (×2): 15 mL via ORAL
  Filled 2011-07-23: qty 15
  Filled 2011-07-23: qty 5
  Filled 2011-07-23: qty 10

## 2011-07-23 MED ORDER — HETASTARCH-ELECTROLYTES 6 % IV SOLN
INTRAVENOUS | Status: DC | PRN
  Administered 2011-07-23: 12:00:00 via INTRAVENOUS

## 2011-07-23 MED ORDER — PROTAMINE SULFATE 10 MG/ML IV SOLN
INTRAVENOUS | Status: DC | PRN
  Administered 2011-07-23: 20 mg via INTRAVENOUS
  Administered 2011-07-23: 50 mg via INTRAVENOUS
  Administered 2011-07-23: 10 mg via INTRAVENOUS
  Administered 2011-07-23: 20 mg via INTRAVENOUS

## 2011-07-23 MED ORDER — DOCUSATE SODIUM 100 MG PO CAPS
100.0000 mg | ORAL_CAPSULE | Freq: Every day | ORAL | Status: DC
  Administered 2011-07-24 – 2011-07-25 (×2): 100 mg via ORAL
  Filled 2011-07-23 (×2): qty 1

## 2011-07-23 MED ORDER — LACTATED RINGERS IV SOLN
INTRAVENOUS | Status: DC | PRN
  Administered 2011-07-23: 09:00:00 via INTRAVENOUS

## 2011-07-23 MED ORDER — MORPHINE SULFATE 2 MG/ML IJ SOLN: 2.0000 mg | INTRAMUSCULAR | Status: DC | PRN

## 2011-07-23 MED ORDER — PHENYLEPHRINE HCL 10 MG/ML IJ SOLN
INTRAMUSCULAR | Status: DC | PRN
  Administered 2011-07-23 (×3): 80 ug via INTRAVENOUS

## 2011-07-23 MED ORDER — ACETAMINOPHEN 10 MG/ML IV SOLN
INTRAVENOUS | Status: AC
  Filled 2011-07-23: qty 100

## 2011-07-23 MED ORDER — POTASSIUM CHLORIDE CRYS ER 20 MEQ PO TBCR: 20.0000 meq | EXTENDED_RELEASE_TABLET | Freq: Once | ORAL | Status: AC | PRN

## 2011-07-23 MED ORDER — SODIUM CHLORIDE 0.9 % IV SOLN
10000.0000 ug | INTRAVENOUS | Status: DC | PRN
  Administered 2011-07-23: 50 ug/min via INTRAVENOUS

## 2011-07-23 MED ORDER — VECURONIUM BROMIDE 10 MG IV SOLR
INTRAVENOUS | Status: DC | PRN
  Administered 2011-07-23 (×5): 5 mg via INTRAVENOUS

## 2011-07-23 MED ORDER — SENNA 8.6 MG PO TABS
2.0000 | ORAL_TABLET | Freq: Every day | ORAL | Status: DC | PRN
  Filled 2011-07-23: qty 2

## 2011-07-23 SURGICAL SUPPLY — 71 items
ADH SKN CLS APL DERMABOND .7 (GAUZE/BANDAGES/DRESSINGS) ×3
BAG ISL DRAPE 18X18 STRL (DRAPES) ×1
BAG ISOLATION DRAPE 18X18 (DRAPES) IMPLANT
BANDAGE ESMARK 6X9 LF (GAUZE/BANDAGES/DRESSINGS) IMPLANT
BNDG CMPR 9X6 STRL LF SNTH (GAUZE/BANDAGES/DRESSINGS)
BNDG ESMARK 6X9 LF (GAUZE/BANDAGES/DRESSINGS)
CANISTER SUCTION 2500CC (MISCELLANEOUS) ×2 IMPLANT
CLIP TI MEDIUM 24 (CLIP) ×2 IMPLANT
CLIP TI WIDE RED SMALL 24 (CLIP) ×3 IMPLANT
CLOTH BEACON ORANGE TIMEOUT ST (SAFETY) ×2 IMPLANT
CORONARY SUCKER SOFT TIP 10052 (MISCELLANEOUS) IMPLANT
COVER PROBE W GEL 5X96 (DRAPES) ×1 IMPLANT
COVER SURGICAL LIGHT HANDLE (MISCELLANEOUS) ×4 IMPLANT
CUFF TOURNIQUET SINGLE 24IN (TOURNIQUET CUFF) IMPLANT
CUFF TOURNIQUET SINGLE 34IN LL (TOURNIQUET CUFF) IMPLANT
CUFF TOURNIQUET SINGLE 44IN (TOURNIQUET CUFF) IMPLANT
DECANTER SPIKE VIAL GLASS SM (MISCELLANEOUS) IMPLANT
DERMABOND ADVANCED (GAUZE/BANDAGES/DRESSINGS) ×3
DERMABOND ADVANCED .7 DNX12 (GAUZE/BANDAGES/DRESSINGS) IMPLANT
DRAIN SNY WOU (WOUND CARE) IMPLANT
DRAPE INCISE IOBAN 66X45 STRL (DRAPES) ×1 IMPLANT
DRAPE ISOLATION BAG 18X18 (DRAPES) ×1
DRAPE ORTHO SPLIT 77X108 STRL (DRAPES) ×2
DRAPE PROXIMA HALF (DRAPES) ×1 IMPLANT
DRAPE SURG ORHT 6 SPLT 77X108 (DRAPES) IMPLANT
DRAPE WARM FLUID 44X44 (DRAPE) ×2 IMPLANT
DRAPE X-RAY CASS 24X20 (DRAPES) IMPLANT
DRSG COVADERM 4X10 (GAUZE/BANDAGES/DRESSINGS) ×1 IMPLANT
DRSG COVADERM 4X8 (GAUZE/BANDAGES/DRESSINGS) ×3 IMPLANT
ELECT REM PT RETURN 9FT ADLT (ELECTROSURGICAL) ×2
ELECTRODE REM PT RTRN 9FT ADLT (ELECTROSURGICAL) ×1 IMPLANT
EVACUATOR SILICONE 100CC (DRAIN) IMPLANT
GLOVE BIO SURGEON STRL SZ7 (GLOVE) ×1 IMPLANT
GLOVE BIO SURGEON STRL SZ7.5 (GLOVE) ×2 IMPLANT
GLOVE BIOGEL PI IND STRL 6.5 (GLOVE) IMPLANT
GLOVE BIOGEL PI INDICATOR 6.5 (GLOVE) ×5
GLOVE ECLIPSE 6.5 STRL STRAW (GLOVE) ×3 IMPLANT
GOWN PREVENTION PLUS XLARGE (GOWN DISPOSABLE) ×2 IMPLANT
GOWN STRL NON-REIN LRG LVL3 (GOWN DISPOSABLE) ×8 IMPLANT
KIT BASIN OR (CUSTOM PROCEDURE TRAY) ×2 IMPLANT
KIT ROOM TURNOVER OR (KITS) ×2 IMPLANT
MARKER GRAFT CORONARY BYPASS (MISCELLANEOUS) IMPLANT
NS IRRIG 1000ML POUR BTL (IV SOLUTION) ×4 IMPLANT
PACK PERIPHERAL VASCULAR (CUSTOM PROCEDURE TRAY) ×2 IMPLANT
PAD ARMBOARD 7.5X6 YLW CONV (MISCELLANEOUS) ×4 IMPLANT
PADDING CAST COTTON 6X4 STRL (CAST SUPPLIES) IMPLANT
SET COLLECT BLD 21X3/4 12 (NEEDLE) IMPLANT
SOLUTION BETADINE 4OZ (MISCELLANEOUS) ×1 IMPLANT
SPONGE GAUZE 4X4 12PLY (GAUZE/BANDAGES/DRESSINGS) ×2 IMPLANT
SPONGE SURGIFOAM ABS GEL 100 (HEMOSTASIS) IMPLANT
STAPLER VISISTAT 35W (STAPLE) ×1 IMPLANT
STOPCOCK 4 WAY LG BORE MALE ST (IV SETS) IMPLANT
SUT PROLENE 5 0 C 1 24 (SUTURE) ×4 IMPLANT
SUT PROLENE 6 0 CC (SUTURE) ×2 IMPLANT
SUT PROLENE 7 0 BV 1 (SUTURE) ×1 IMPLANT
SUT PROLENE 7 0 BV1 MDA (SUTURE) IMPLANT
SUT SILK 2 0 SH (SUTURE) ×3 IMPLANT
SUT SILK 3 0 (SUTURE) ×6
SUT SILK 3-0 18XBRD TIE 12 (SUTURE) IMPLANT
SUT VIC AB 2-0 CTX 36 (SUTURE) ×4 IMPLANT
SUT VIC AB 3-0 SH 27 (SUTURE) ×8
SUT VIC AB 3-0 SH 27X BRD (SUTURE) ×2 IMPLANT
SUT VICRYL 4-0 PS2 18IN ABS (SUTURE) ×4 IMPLANT
TAPE UMBILICAL COTTON 1/8X30 (MISCELLANEOUS) ×1 IMPLANT
TOWEL OR 17X24 6PK STRL BLUE (TOWEL DISPOSABLE) ×5 IMPLANT
TOWEL OR 17X26 10 PK STRL BLUE (TOWEL DISPOSABLE) ×4 IMPLANT
TRAY FOLEY CATH 14FRSI W/METER (CATHETERS) ×2 IMPLANT
TUBE CONNECTING 12X1/4 (SUCTIONS) ×1 IMPLANT
TUBING EXTENTION W/L.L. (IV SETS) IMPLANT
UNDERPAD 30X30 INCONTINENT (UNDERPADS AND DIAPERS) ×3 IMPLANT
WATER STERILE IRR 1000ML POUR (IV SOLUTION) ×2 IMPLANT

## 2011-07-23 NOTE — H&P (View-Only) (Signed)
[facility] [place] HISTORY AND PHYSICAL -PAD CC: Dr. Fields Referring Physician:Fields  History of Present Illness  Mathew Padilla is a 60 y.o. male patient who presents with chief complaint of left lower extremity PAD. Pt. denies rest pain; reports night pain denies non healing ulcers on left lower extremity. He previously had a fem-fem bypass performed on December 21 by Dr. Brabham. He also had a right femoral to below-knee popliteal bypass with Gore-Tex the same time for thrombosed popliteal aneurysm.    Pt has had previous intervention of Right Vascular Bypass .  Past Medical History  Diagnosis Date  . Arteriosclerotic cardiovascular disease (ASCVD) 2000 he    PTCA of PDA and 2nd marginal in 3/00  . Hypertension     Mild; controlled with a single agent  . AAA (abdominal aortic aneurysm) 2007    stent graft repair 12/07  . Laryngeal carcinoma 2010    resection and radiation therapy 2010-11  . Diverticulitis 2006  . Obesity     BMI 47; lap band surgery 2010  . Gastroesophageal reflux disease     Hiatal hernia; previously treated with PPI  . Sleep apnea     BiPAP mask utilized   . Tobacco abuse, in remission     40 pack years; discontinued in 2000  . Cerebrovascular disease     carotid stent  . Cholelithiasis 11/28/2010  . Elevated PSA 06/2011    Social History History  Substance Use Topics  . Smoking status: Former Smoker    Quit date: 11/26/1997  . Smokeless tobacco: Never Used   Comment: 40 pack years; quit 2000  . Alcohol Use: No    Family History Family History  Problem Relation Age of Onset  . Heart attack Father     deceased  . Heart failure Father   . Hyperlipidemia Father   . Hypertension Father   . Heart disease Father   . Lung cancer Mother     deceased   . Hypertension Mother   . Hyperlipidemia Mother   . Heart failure Mother   . Diabetes Mother   . Cancer Sister     vaginal  . Diabetes Sister     ROS: [x]  Positive   [ ] Denies  General:[ ] Weight loss,  [ ] Weight gain, [ ] Fever, [ ] chills Neurologic: [ ] Dizziness, [ ] Blackouts, [ ] Seizure [ ] Stroke, [ ] "Mini stroke", [ ] Slurred speech, [ ] Temporary blindness;  [ ]weakness, [ ] Hoarseness Cardiac: [ ] Chest pain/pressure, [ ] Shortness of breath at rest [ ] Shortness of breath with exertion,  [ ]  Atrial fibrillation or irregular heartbeat Vascular:[x ] Pain in legs with walking, [ ] Pain in legs at rest ,[ ] Pain in legs at night,  [ ]  Non-healing ulcer, [ ] Blood clot in vein/DVT,   Pulmonary: [ ] Home oxygen, [ ]  Productive cough, [ ] Coughing up blood,  [ ] Asthma,  [ ] Wheezing Musculoskeletal:  [ ] Arthritis, [ ] Low back pain,  [ ] Joint pain Hematologic:[ ] Easy Bruising, [ ] Anemia; [ ] Hepatitis Gastrointestinal: [ ] Blood in stool,  [ ] Gastroesophageal Reflux, [ ] Trouble swallowing Urinary: [ ] chronic Kidney disease, [ ] on HD, [ ] Burning with urination, [ ] Frequent urination, [ ] Difficulty urinating;  Skin: [ ] Rashes, [ ] Wounds    Allergies  Allergen Reactions  .   Aspirin     Daily dose upsets stomach  . Statins Hives and Rash    Current Outpatient Prescriptions  Medication Sig Dispense Refill  . aspirin EC 81 MG tablet Take 81 mg by mouth daily.        . lisinopril (PRINIVIL,ZESTRIL) 40 MG tablet Take 40 mg by mouth daily.        . omeprazole (PRILOSEC) 20 MG capsule Take 20 mg by mouth daily.       . potassium chloride (K-DUR,KLOR-CON) 10 MEQ tablet Take by mouth 2 (two) times daily.       . senna (SENOKOT) 8.6 MG TABS Take 2 tablets by mouth at bedtime.        . Tamsulosin HCl (FLOMAX) 0.4 MG CAPS Take 0.4 mg by mouth daily after supper.        . cephALEXin (KEFLEX) 500 MG capsule Take 500 mg by mouth 2 (two) times daily.      . oxyCODONE-acetaminophen (PERCOCET) 5-325 MG per tablet Take 1 tablet by mouth every 4 (four) hours as needed.        Physical Examination  Filed Vitals:   07/17/11 0944    BP: 139/88  Pulse: 84  Resp: 16    General: A&O x 3, WDWN,  Gait: normal Eyes: PERRLA, Pulmonary: CTAB, without wheezes , rales or rhonchi Cardiac: regular Rythm , without murmur                                                                  RIGHT                                       LEFT  CAROTID BRUIT    VASCULAR EXAM: Extremities with ischemic changes in the lower extremities bilaterally. without Gangrene, open wounds;drainage.                                                                                                    LOWER EXTREMITY PULSES           RIGHT                                      LEFT      FEMORAL  present and palpable  present and palpable        POPLITEAL  present and palpable   present and palpable       POSTERIOR TIBIAL  absent and palpable   absent and palpable        DORSALIS PEDIS      ANTERIOR TIBIAL biphasic by Doppler and palpable   biphasic by Doppler and palpable        PERONEAL    {      Abdomen: soft, NT, no masses Skin: no rashes, ulcers noted Musculoskeletal: no muscle wasting or atrophy  Neurologic: A&O X 3; Appropriate Affect ; SENSATION: normal; MOTOR FUNCTION:  moving all extremities equally. Speech is fluent/normal    Non-Invasive Vascular Imaging: DATE: 07/17/2011 AAA SAC 3.7,previous 06/2010- 3.7    ASSESSMENT: Mathew Padilla is a 60 y.o. male who presents with: left lower PAD WITH a known popliteal aneurysm which Dr. Fields plans to bypass March 27..   PLAN: Based on the patient's vascular studies and examination, patient will undergo a left popliteal bypass graft with Dr. Fields 07/23/2011 and will return to clinic per Dr. Fields discharge instructions after the procedure.  Alana Dayton ANP  Clinic MD: Fields  Pt with prior stent graft for AAA.  Thrombosed one limb requiring fem fem bypass.  Recent PTFE right fem pop by Dr Brabham for popliteal aneurysm   Feet cool bilat but biphasic to triphasic doppler.  Has  known left popliteal aneurysm and adequate vein.  Will plan above to below knee bypass next week.  Risk/benefit/ procedure details discussed.  Charles Fields, MD [facility] [place] Office: [phone] Pager: [phone]  

## 2011-07-23 NOTE — Interval H&P Note (Signed)
History and Physical Interval Note:  07/23/2011 8:33 AM  Mathew Padilla  has presented today for surgery, with the diagnosis of PVD  The various methods of treatment have been discussed with the patient and family. After consideration of risks, benefits and other options for treatment, the patient has consented to  Procedure(s) (LRB): BYPASS GRAFT POPLITEAL TO POPLITEAL (Left) as a surgical intervention .  The patients' history has been reviewed, patient examined, no change in status, stable for surgery.  I have reviewed the patients' chart and labs.  Questions were answered to the patient's satisfaction.     FIELDS,CHARLES E

## 2011-07-23 NOTE — Preoperative (Signed)
Beta Blockers   Reason not to administer Beta Blockers:Not Applicable, pt not currently on beta blocker

## 2011-07-23 NOTE — Transfer of Care (Signed)
Immediate Anesthesia Transfer of Care Note  Patient: Mathew Padilla  Procedure(s) Performed: Procedure(s) (LRB): BYPASS GRAFT POPLITEAL TO POPLITEAL (Left)  Patient Location: PACU  Anesthesia Type: General  Level of Consciousness: awake, oriented and patient cooperative  Airway & Oxygen Therapy: Patient Spontanous Breathing and Patient connected to face mask oxygen  Post-op Assessment: Report given to PACU RN, Post -op Vital signs reviewed and stable and Patient moving all extremities X 4  Post vital signs: Reviewed  Complications: No apparent anesthesia complications

## 2011-07-23 NOTE — Anesthesia Preprocedure Evaluation (Signed)
Anesthesia Evaluation  Patient identified by MRN, date of birth, ID band Patient awake    Reviewed: Allergy & Precautions, H&P , NPO status , Patient's Chart, lab work & pertinent test results  Airway Mallampati: III TM Distance: >3 FB Neck ROM: Limited  Mouth opening: Limited Mouth Opening  Dental  (+) Teeth Intact and Dental Advisory Given   Pulmonary shortness of breath and with exertion, sleep apnea and Continuous Positive Airway Pressure Ventilation , pneumonia , former smoker breath sounds clear to auscultation        Cardiovascular hypertension, Pt. on medications + Past MI Rhythm:Regular Rate:Normal     Neuro/Psych    GI/Hepatic Neg liver ROS, GERD-  Medicated and Controlled,  Endo/Other  negative endocrine ROS  Renal/GU negative Renal ROS     Musculoskeletal negative musculoskeletal ROS (+)   Abdominal (+) + obese,   Peds  Hematology negative hematology ROS (+)   Anesthesia Other Findings   Reproductive/Obstetrics                           Anesthesia Physical Anesthesia Plan  ASA: III  Anesthesia Plan: General   Post-op Pain Management:    Induction: Intravenous  Airway Management Planned: Oral ETT  Additional Equipment:   Intra-op Plan:   Post-operative Plan: Extubation in OR  Informed Consent: I have reviewed the patients History and Physical, chart, labs and discussed the procedure including the risks, benefits and alternatives for the proposed anesthesia with the patient or authorized representative who has indicated his/her understanding and acceptance.   Dental advisory given  Plan Discussed with: Anesthesiologist  Anesthesia Plan Comments:         Anesthesia Quick Evaluation

## 2011-07-23 NOTE — Op Note (Signed)
Procedure: Procedure:#1 Ligation of left popliteal aneurysm                            #2 left above-knee to below-knee popliteal bypass with reversed greater saphenous vein   Preoperative diagnosis: Left popliteal aneurysm  Postoperative diagnosis: Same  Anesthesia: Gen.  Assistant: Newton Pigg PA-C  Findings:  1.  3-4 mm left greater saphenous vein graft  2.  popliteal artery aneurysm excluded by oversewing and dividing the artery in the above knee and below knee position  Operative details: After obtaining informed consent, the patient was taken to the operating room. The patient was placed in supine position on the operating room table. After induction of general anesthesia and endotracheal ablation the patient's entire left lower extremity is prepped and draped in usual sterile fashion. Next ultrasound was used to identify the left greater saphenous vein. The greater saphenous vein was of good diameter. This was marked along the medial aspect of the left leg. Next several longitudinal incisions were made on the medial aspect of the left leg in order to expose the saphenous vein. Small side branches were ligated and divided between silk ties or clips. The vein was of good quality approximately 3-4 mm in diameter throughout its course. The vein was dissected free circumferentially from the mid left thigh to the mid left calf. The incision below the knee was deepened through the saphenectomy incision to the below-knee popliteal space. The popliteal artery was dissected free circumferentially all the way down to the takeoff of the anterior tibial vein. Popliteal vein is mobilized in order to assist in exposure of the artery. Several small geniculate arterial and venous branches were ligated and divided between silk ties to aid in exposure. The aneurysm was easily visualized in the below knee popliteal space. The artery was dissected free circumferentially several centimeters below what was  obviously aneurysmal tissue.  Attention was then turned back to the thigh. A longitudinal incision was made area of the vastus medialis. Incision was carried into the subcutaneous tissues down to level of the sartorius muscle. The sartorius muscle was reflected posteriorly. The above-knee popliteal space was entered. Dissection was carried down to the above-knee popliteal artery. There were several large venous tributaries crossing over this and these were all ligated and divided between silk ties. Several small geniculate arterial branches were also ligated and divided the silk ties to aid in exposure. The artery was dissected free circumferentially all the way to the level of the adductor hiatus. The artery was not aneurysmal in this area although it was moderately to severely calcified. At this point the distance from the above-knee popliteal the below-knee popliteal space was measured a similar length of saphenous vein was harvested by ligating the distal vein with a 2-0 silk suture ligature in the proximal vein was 2 silk suture ligature. Vein was thoroughly flushed with heparinized saline. It was found to be hemostatic. It was placed in heparinized saline solution.  A tunnel was then made between the heads of the gastrocnemius muscle up to the above-knee popliteal space and umbilical tape placed under this. The patient was then given 15,000 units of intravenous heparin. A Henley clamp was used to control the proximal popliteal artery. A Henley clamp was also used to control the popliteal artery in the above-knee space just above the aneurysm. The popliteal artery was completely transected in this location and the distal end of the popliteal artery oversewn with  a running 5-0 Prolene suture. The proximal popliteal artery was spatulated. Vein was placed in reversed configuration and spatulated and sewn end-to-end to the proximal artery using running 5-0 Prolene suture. At completion of the anastomosis there  was good pulsatile flow the vein graft everything was still hemostatic. The vein graft was marked for orientation and then brought through the deep tunnel. The leg was straightened to determine length. Popliteal aneurysm below the knee was then exposed. A Henley clamp was used to control the distal below-knee popliteal artery as well as the popliteal artery just below the aneurysm in the below-knee popliteal space. The artery was transected at this location and the proximal end of the aneurysm oversewn with a running 5-0 Prolene suture. This excluded the proximal and distal ends of aneurysm.  The vein graft was cut to length and the distal artery slightly spatulated. The vein was also spatulated. These were then sewn end and using a running 6-0 Prolene suture. Prior completion anastomosis everything was forebled backbled and thoroughly flushed. Anastomosis was secured clamps released there was good pulsatile flow the below-knee popliteal artery immediately. There was good Doppler flow in the dorsalis pedis and posterior tibial arteries.. The patient was also given 100 mg protamine. Hemostasis was obtained.  The below-knee popliteal incision was then closed in multiple layers of running 2 and 3-0 Vicryl suture and 4-0 Vicryl subcuticular stitch. Saphenectomy incisions were closed with a running 3-0 Vicryl stitch suture and 4-0 Vicryl subcuticular stitch. The above-knee popliteal incision was closed with a running 3-0 Vicryl suture and a 4 Vicryl subcuticular stitch. The patient tolerated procedure well and there were no complications. Instrument sponge and needle counts were correct at the end of the case. The patient was taken to the recovery room in stable condition.  Fabienne Bruns, MD [facility] [place] Office: [phone] Pager: [phone]

## 2011-07-23 NOTE — Anesthesia Postprocedure Evaluation (Signed)
  Anesthesia Post-op Note  Patient: Mathew Padilla  Procedure(s) Performed: Procedure(s) (LRB): BYPASS GRAFT POPLITEAL TO POPLITEAL (Left)  Patient Location: PACU  Anesthesia Type: General  Level of Consciousness: awake  Airway and Oxygen Therapy: Patient Spontanous Breathing and Patient connected to face mask oxygen  Post-op Pain: mild  Post-op Assessment: Post-op Vital signs reviewed, Patient's Cardiovascular Status Stable, Respiratory Function Stable, Patent Airway and No signs of Nausea or vomiting  Post-op Vital Signs: Reviewed and stable  Complications: No apparent anesthesia complications

## 2011-07-24 DIAGNOSIS — Z48812 Encounter for surgical aftercare following surgery on the circulatory system: Secondary | ICD-10-CM

## 2011-07-24 LAB — BASIC METABOLIC PANEL
Calcium: 8.3 mg/dL — ABNORMAL LOW (ref 8.4–10.5)
Creatinine, Ser: 0.65 mg/dL (ref 0.50–1.35)
GFR calc Af Amer: >90 mL/min (ref >90)
GFR calc non Af Amer: >90 mL/min (ref >90)
Sodium: 135 mEq/L (ref 135–145)

## 2011-07-24 LAB — CBC
MCH: 29.6 pg (ref 26.0–34.0)
MCV: 89.3 fL (ref 78.0–100.0)
Platelets: 157 10*3/uL (ref 150–400)
RDW: 13.3 % (ref 11.5–15.5)

## 2011-07-24 MED ORDER — MENTHOL 3 MG MT LOZG
1.0000 | LOZENGE | OROMUCOSAL | Status: DC | PRN
  Administered 2011-07-25: 3 mg via ORAL
  Filled 2011-07-24: qty 9

## 2011-07-24 MED ORDER — OXYCODONE HCL 5 MG PO TABS: 5.0000 mg | ORAL_TABLET | ORAL | Status: AC | PRN

## 2011-07-24 MED ORDER — CYCLOBENZAPRINE HCL 5 MG PO TABS
7.5000 mg | ORAL_TABLET | Freq: Three times a day (TID) | ORAL | Status: DC | PRN
  Filled 2011-07-24: qty 1.5

## 2011-07-24 NOTE — Progress Notes (Signed)
VASCULAR LAB PRELIMINARY  ARTERIAL  ABI completed:    RIGHT    LEFT    PRESSURE WAVEFORM  PRESSURE WAVEFORM  BRACHIAL 134 tri BRACHIAL 133 tri  DP   DP    AT 102 mono AT 95 mono  PT 111 mono PT 109 mono  PER   PER    GREAT TOE  NA GREAT TOE  NA    RIGHT LEFT  ABI 0.83 0.81     Ravindra Baranek, Karsten Ro, RVT 07/24/2011, 8:57 AM

## 2011-07-24 NOTE — Progress Notes (Addendum)
[facility] [place]  Progress Note Bypass Surgery  Date of Surgery: 07/23/2011  Procedure(s): left BYPASS GRAFT POPLITEAL TO POPLITEAL Surgeon: Surgeon(s): Sherren Kerns, MD  1 Day Post-Op  History of Present Illness  Mathew Padilla is a 61 y.o. male who is S/P Procedure(s): BYPASS GRAFT POPLITEAL TO POPLITEAL left. Patients pain is well controlled.   C/O some burning pain around the knee and thigh and some spasms in his legs  VASC. LAB Studies:        ABI: Pending   Significant Diagnostic Studies: CBC Lab Results  Component Value Date   WBC 10.6* 07/24/2011   HGB 13.0 07/24/2011   HCT 39.2 07/24/2011   MCV 89.3 07/24/2011   PLT 157 07/24/2011    BMET     Component Value Date/Time   NA 135 07/24/2011 0532   K 4.0 07/24/2011 0532   CL 101 07/24/2011 0532   CO2 24 07/24/2011 0532   GLUCOSE 142* 07/24/2011 0532   BUN 8 07/24/2011 0532   CREATININE 0.65 07/24/2011 0532   CALCIUM 8.3* 07/24/2011 0532   GFRNONAA >90 07/24/2011 0532   GFRAA >90 07/24/2011 0532    COAG Lab Results  Component Value Date   INR 1.06 07/21/2011   INR 1.03 04/18/2011   No results found for this basename: PTT    Physical Examination  BP Readings from Last 3 Encounters:  07/24/11 119/63  07/24/11 119/63  07/21/11 149/87   Temp Readings from Last 3 Encounters:  07/24/11 97.6 F (36.4 C) Oral  07/24/11 97.6 F (36.4 C) Oral  07/21/11 98.1 F (36.7 C)    SpO2 Readings from Last 3 Encounters:  07/24/11 95%  07/24/11 95%  07/21/11 95%   Pulse Readings from Last 3 Encounters:  07/24/11 91  07/24/11 91  07/21/11 82    Pt is A&O x 3 left lower extremity: Incision/s is/are clean,dry.intact, and  healing without hematoma, erythema or drainage Limb is warm; with good color  Left Dorsalis Pedis pulse is monophasic by Doppler LeftPosterior tibial pulse is  weak and monophasic by Doppler   Assessment/Plan: Pt. Doing well Post-op pain is controlled Wounds are  healing well PT/OT for ambulation Continue wound care as ordered Muscle spasms/nerve pain should resolve with time - flexeril as needed Transfer to [address] DC in am  [place] [phone] 07/24/2011 8:04 AM   Agree with above Incisions healing Left foot pink and warm Ambulate today Hopefully d/c tomorrow.  Fabienne Bruns, MD [facility] [place] Office: [phone] Pager: [phone]

## 2011-07-24 NOTE — Progress Notes (Signed)
UR COMPLETED  

## 2011-07-24 NOTE — Progress Notes (Signed)
Pt. Being transferred to 2006 via wheelchair. Phone report called to Alycia Rossetti, Charity fundraiser. Pt. And wife aware of transfer  And no further questions or concerns voiced

## 2011-07-24 NOTE — Progress Notes (Signed)
Home CPAP. Pt. Is able to place himself on/off CPAP.

## 2011-07-25 MED ORDER — FAMOTIDINE 20 MG PO TABS
20.0000 mg | ORAL_TABLET | Freq: Two times a day (BID) | ORAL | Status: DC
  Filled 2011-07-25 (×2): qty 1

## 2011-07-25 NOTE — Progress Notes (Signed)
Pharmacy: Pepcid  Patient's tolerating PO meds.  Pepcid changed to PO per P&T policy.  Cesily Cuoco, PharmD, BCPS   

## 2011-07-25 NOTE — Discharge Summary (Signed)
[facility] Discharge Summary  Mathew Padilla 23-Jan-1951 61 y.o. male  [id number]  Admission Date: 07/23/2011  Discharge Date: 07/25/11  Physician: Sherren Kerns, MD  Admission Diagnosis: PVD   HPI:   This is a 61 y.o. male patient who presents with chief complaint of left lower extremity PAD. Pt. denies rest pain;  reports night pain denies non healing ulcers on left lower extremity. He previously had a fem-fem bypass performed on December 21 by Dr. Myra Gianotti. He also had a right femoral to below-knee popliteal bypass with Gore-Tex the same time for thrombosed popliteal aneurysm.   Hospital Course:  The patient was admitted to the hospital and taken to the operating room on 07/23/2011 and underwent  :#1 Ligation of left popliteal aneurysm  #2 left above-knee to below-knee popliteal bypass with reversed greater saphenous vein     The pt tolerated the procedure well and was transported to the PACU in good condition. By POD 1, he was doing well and was transferred to the telemetry floor.  By POD 2, he was ambulating well.  He was given a Rx for a rolling walker and was discharged home that day.  The remainder of the hospital course consisted of increasing ambulation and increasing intake of solids without difficulty.  CBC    Component Value Date/Time   WBC 10.6* 07/24/2011 0532   RBC 4.39 07/24/2011 0532   HGB 13.0 07/24/2011 0532   HCT 39.2 07/24/2011 0532   PLT 157 07/24/2011 0532   MCV 89.3 07/24/2011 0532   MCH 29.6 07/24/2011 0532   MCHC 33.2 07/24/2011 0532   RDW 13.3 07/24/2011 0532   LYMPHSABS 1.0 07/21/2011 1000   MONOABS 0.5 07/21/2011 1000   EOSABS 0.2 07/21/2011 1000   BASOSABS 0.0 07/21/2011 1000    BMET    Component Value Date/Time   NA 135 07/24/2011 0532   K 4.0 07/24/2011 0532   CL 101 07/24/2011 0532   CO2 24 07/24/2011 0532   GLUCOSE 142* 07/24/2011 0532   BUN 8 07/24/2011 0532   CREATININE 0.65 07/24/2011 0532   CALCIUM 8.3* 07/24/2011 0532    GFRNONAA >90 07/24/2011 0532   GFRAA >90 07/24/2011 0532     Discharge Instructions:   The patient is discharged to home with extensive instructions on wound care and progressive ambulation.  They are instructed not to drive or perform any heavy lifting until returning to see the physician in his office.  Discharge Orders    Future Appointments: Provider: Department: Dept Phone: Center:   08/07/2011 4:00 PM Sherren Kerns, MD [facility] [phone] [facility]     Future Orders Please Complete By Expires   Resume previous diet      Driving Restrictions      Comments:   No driving for 4 weeks   Call MD for:  temperature >100.5      Call MD for:  redness, tenderness, or signs of infection (pain, swelling, bleeding, redness, odor or green/yellow discharge around incision site)      Call MD for:  severe or increased pain, loss or decreased feeling  in affected limb(s)      Walk with assistance      May shower       Scheduling Instructions:   Saturday      Discharge Diagnosis:  PVD  Secondary Diagnosis: Patient Active Problem List  Diagnoses  . HYPERTENSION  . Arteriosclerotic cardiovascular disease (ASCVD)  . Laryngeal carcinoma  . AAA (abdominal aortic aneurysm)  .  Diverticulitis  . Obesity  . Gastroesophageal reflux disease  . Sleep apnea  . Tobacco abuse, in remission  . Cerebrovascular disease  . Low back pain  . Pneumonia  . Cholelithiasis  . Pulmonary infiltrate  . Peripheral vascular disease, unspecified  . Abdominal aneurysm without mention of rupture   Past Medical History  Diagnosis Date  . Arteriosclerotic cardiovascular disease (ASCVD) 2000 he    PTCA of PDA and 2nd marginal in 3/00  . AAA (abdominal aortic aneurysm) 2007    stent graft repair 12/07  . Laryngeal carcinoma 2010    resection and radiation therapy 2010-11  . Diverticulitis 2006  . Obesity     BMI 47; lap band surgery 2010  . Gastroesophageal reflux disease     Hiatal hernia;  previously treated with PPI  . Tobacco abuse, in remission     40 pack years; discontinued in 2000  . Cerebrovascular disease     carotid stent  . Cholelithiasis 11/28/2010  . Elevated PSA 06/2011  . Sleep apnea     BiPAP mask utilized        2 yrs sleep study    dr Andrey Campanile  . Hypertension     Mild; controlled with a single agent       dr Dietrich Pates      cardiac md  . Shortness of breath   . Cough, persistent   . Myocardial infarction     1999      Mathew Padilla, Mathew Padilla  Home Medication Instructions ZOX:[id number]   Printed on:07/25/11 0738  Medication Information                    lisinopril (PRINIVIL,ZESTRIL) 40 MG tablet Take 40 mg by mouth daily.             omeprazole (PRILOSEC) 20 MG capsule Take 20 mg by mouth daily.            potassium chloride (K-DUR,KLOR-CON) 10 MEQ tablet Take by mouth 2 (two) times daily.            senna (SENOKOT) 8.6 MG TABS Take 2 tablets by mouth daily as needed. For constipation           Tamsulosin HCl (FLOMAX) 0.4 MG CAPS Take 0.4 mg by mouth daily after supper.             aspirin EC 81 MG tablet Take 81 mg by mouth every other day.            triazolam (HALCION) 0.25 MG tablet Take 0.25 mg by mouth at bedtime as needed. For sleep           oxyCODONE (OXY IR/ROXICODONE) 5 MG immediate release tablet Take 1-2 tablets (5-10 mg total) by mouth every 4 (four) hours as needed.             Disposition: home  Patient's condition: is Good  Follow up: 1. Dr. Darrick Penna in 2 weeks   Newton Pigg, PA-C [facility] [phone] 07/25/2011  7:38 AM

## 2011-07-25 NOTE — Progress Notes (Signed)
CARE MANAGEMENT NOTE 07/25/2011  Patient:  Mathew Padilla, Mathew Padilla   Account Number:  [id number]  Date Initiated:  07/25/2011  Documentation initiated by:  Vance Peper  Subjective/Objective Assessment:     Action/Plan:   Patient stated he will get rolling walker at the [facility] in [place].   Anticipated DC Date:  07/25/2011   Anticipated DC Plan:  HOME/SELF CARE      DC Planning Services  CM consult      PAC Choice  DURABLE MEDICAL EQUIPMENT   Choice offered to / List presented to:     DME arranged  OTHER - SEE COMMENT      DME agency  NA     HH arranged  NA      HH agency  NA   Status of service:  Completed, signed off MDischarge Disposition:  HOME/SELF CARE

## 2011-07-25 NOTE — Progress Notes (Signed)
[facility] Progress Note  07/25/2011 7:33 AM POD 2  Subjective:  Sleeping with CPAP in place.  Awakes easily.  Left leg sore.  Afebrile VSS  96%CPAP Filed Vitals:   07/25/11 0453  BP: 152/78  Pulse: 86  Temp: 96.1 F (35.6 C)  Resp: 18    Physical Exam: Incisions:  All incisions are c/d/i without drainage.  Extremities:  +palpable left DP.  Left foot warm and well perfused.  CBC    Component Value Date/Time   WBC 10.6* 07/24/2011 0532   RBC 4.39 07/24/2011 0532   HGB 13.0 07/24/2011 0532   HCT 39.2 07/24/2011 0532   PLT 157 07/24/2011 0532   MCV 89.3 07/24/2011 0532   MCH 29.6 07/24/2011 0532   MCHC 33.2 07/24/2011 0532   RDW 13.3 07/24/2011 0532   LYMPHSABS 1.0 07/21/2011 1000   MONOABS 0.5 07/21/2011 1000   EOSABS 0.2 07/21/2011 1000   BASOSABS 0.0 07/21/2011 1000    BMET    Component Value Date/Time   NA 135 07/24/2011 0532   K 4.0 07/24/2011 0532   CL 101 07/24/2011 0532   CO2 24 07/24/2011 0532   GLUCOSE 142* 07/24/2011 0532   BUN 8 07/24/2011 0532   CREATININE 0.65 07/24/2011 0532   CALCIUM 8.3* 07/24/2011 0532   GFRNONAA >90 07/24/2011 0532   GFRAA >90 07/24/2011 0532    INR    Component Value Date/Time   INR 1.06 07/21/2011 1000     Intake/Output Summary (Last 24 hours) at 07/25/11 0733 Last data filed at 07/24/11 2100  Gross per 24 hour  Intake   1370 ml  Output   1710 ml  Net   -340 ml     Assessment/Plan:  61 y.o. male is s/p  :#1 Ligation of left popliteal aneurysm  #2 left above-knee to below-knee popliteal bypass with reversed greater saphenous vein    POD 2 -doing well.  Has not ambulated in halls.  Needs RW.  Will consult case management. -may be able to dc later today or tomorrow if pt tolerates ambulation.   Newton Pigg, PA-C [facility] [phone] 07/25/2011 7:33 AM

## 2011-07-28 NOTE — Procedures (Unsigned)
VASCULAR LAB EXAM  INDICATION:  Follow up AAA endograft placed 03/2006.  HISTORY: Diabetes:  No Cardiac:  Yes Hypertension:  Yes  EXAM:  AAA sac size: 3.7cm AP.  Previous sac size 07/25/2010 (by CT scan): 3.7cm AP.  IMPRESSION: 1. The aorta and endograft appear patent. 2. No significant change in size of the aneurysmal sac surrounding the     endograft. 3. Graft was not visualized well enough to rule out endoleak due to     bowel gas/body habitus.  ___________________________________________ Janetta Hora. Fields, MD  LT/MEDQ  D:  07/17/2011  T:  07/17/2011  Job:  [id number]

## 2011-08-05 ENCOUNTER — Encounter: Payer: Self-pay | Admitting: Vascular Surgery

## 2011-08-07 ENCOUNTER — Ambulatory Visit (INDEPENDENT_AMBULATORY_CARE_PROVIDER_SITE_OTHER): Payer: Medicare Other | Admitting: Vascular Surgery

## 2011-08-07 ENCOUNTER — Encounter: Payer: Self-pay | Admitting: Vascular Surgery

## 2011-08-07 ENCOUNTER — Ambulatory Visit: Payer: Medicare Other | Admitting: Vascular Surgery

## 2011-08-07 VITALS — BP 140/81 | HR 77 | Temp 98.0°F | Resp 20 | Ht 71.0 in | Wt 319.0 lb

## 2011-08-07 DIAGNOSIS — Z95828 Presence of other vascular implants and grafts: Secondary | ICD-10-CM

## 2011-08-07 DIAGNOSIS — Z9889 Other specified postprocedural states: Secondary | ICD-10-CM

## 2011-08-07 DIAGNOSIS — I724 Aneurysm of artery of lower extremity: Secondary | ICD-10-CM

## 2011-08-07 NOTE — Progress Notes (Signed)
Patient returns today for followup after his left above-knee to below-knee popliteal bypass for popliteal aneurysm. He reports his incisions are healing well. He still has some mild swelling in his left lower extremity but otherwise is doing well. He is ambulating daily. He is asking to return to exercise at the gym and a good candidate with his grandchildren.  Physical exam: Filed Vitals:   08/07/11 0943  BP: 140/81  Pulse: 77  Temp: 98 F (36.7 C)  TempSrc: Oral  Resp: 20  Height: 5\' 11"  (1.803 m)  Weight: 319 lb (144.697 kg)   left lower extremity all incisions are well-healed, monophasic dorsalis pedis posterior tibial and peroneal artery left leg  Right lower extremity biphasic dorsalis pedis with monophasic posterior tibial, does have a bluish discoloration bilaterally but there is brisk capillary refill. This is unchanged from his previous visits.  Assessment: Patent bypass status post above-knee to below-knee popliteal bypass for popliteal aneurysm., Patent bypass right lower extremity from repair of right popliteal aneurysm. Patent femoral-femoral bypass from recent limb occlusion of aortic stent graft  Plan: The patient will have a CT angiogram with lower extremity runoff in July of 2013 if his CT scan looks okay at that point we will place him back on her lower extremity bypass ultrasound protocol and back to once yearly CT scans  Fabienne Bruns, MD [facility] [place] Office: [phone] Pager: [phone]

## 2011-08-07 NOTE — Progress Notes (Signed)
Addended by: Sharee Pimple on: 08/07/2011 03:49 PM   Modules accepted: Orders

## 2011-08-08 ENCOUNTER — Encounter: Payer: Self-pay | Admitting: Cardiology

## 2011-08-26 ENCOUNTER — Telehealth (INDEPENDENT_AMBULATORY_CARE_PROVIDER_SITE_OTHER): Payer: Self-pay | Admitting: Surgery

## 2011-08-26 NOTE — Telephone Encounter (Signed)
I called the patient back.  He has a hx of throat ca, and heart bypass.  He also may need surgery soon for prostate ca.  He has been having, for a few weeks, reflux mostly at night, extreme coughing and hiccups.  He states he can eat at 6pm and then wake up at midnight vomiting.  Some days he can't keep liquids down.  I asked him if he was sure that his throat ca is cleared up and he says yes.  I wondered if that was causing any symptoms.   I asked him if we didn't pull all the fluid out when he was dealing with the throat ca and he says no, he still has some fluid.  Dr Ezzard Standing is out this week so I offered him an appointment for Thursday with Bary Richard, PA.   I thought he can at least pull all of the fluid out and see if he needs an ugi in the mean time.

## 2011-08-28 ENCOUNTER — Ambulatory Visit (INDEPENDENT_AMBULATORY_CARE_PROVIDER_SITE_OTHER): Payer: Medicare Other | Admitting: Physician Assistant

## 2011-08-28 ENCOUNTER — Encounter (INDEPENDENT_AMBULATORY_CARE_PROVIDER_SITE_OTHER): Payer: Self-pay

## 2011-08-28 VITALS — BP 132/86 | HR 88 | Temp 97.2°F | Resp 18 | Ht 71.0 in | Wt 318.6 lb

## 2011-08-28 DIAGNOSIS — Z4651 Encounter for fitting and adjustment of gastric lap band: Secondary | ICD-10-CM

## 2011-08-28 NOTE — Patient Instructions (Signed)
Return in one month. Take prilosec 20mg  daily for two weeks.

## 2011-08-28 NOTE — Progress Notes (Signed)
  HISTORY: Mathew Padilla is a 61 y.o.male who received an AP-Large lap-band in August 2010 by Dr. Ezzard Standing. He comes in complaining of several months of dysphagia, nocturnal reflux, cough and hiccups. He has had a dx of throat cancer as well as having a fem-pop bypass since his last visit.  VITAL SIGNS: Filed Vitals:   08/28/11 1235  BP: 132/86  Pulse: 88  Temp: 97.2 F (36.2 C)  Resp: 18    PHYSICAL EXAM: Physical exam reveals a very well-appearing 60 y.o.male in no apparent distress Neurologic: Awake, alert, oriented Psych: Bright affect, conversant Respiratory: Breathing even and unlabored. No stridor or wheezing Abdomen: Soft, nontender, nondistended to palpation. Incisions well-healed. No incisional hernias. Port easily palpated. Extremities: Atraumatic, good range of motion.  ASSESMENT: 61 y.o.  male  s/p AP-Large lap-band.   PLAN: The patient's port was accessed with a 20G Huber needle without difficulty. Clear fluid was aspirated and 7.2 mL saline was removed from the port to render the band empty. He was able to swallow water afterwards. I recommended prilosec for two weeks and to return in the next month to see me.

## 2011-09-30 ENCOUNTER — Encounter (INDEPENDENT_AMBULATORY_CARE_PROVIDER_SITE_OTHER): Payer: Self-pay | Admitting: Surgery

## 2011-10-02 ENCOUNTER — Encounter (INDEPENDENT_AMBULATORY_CARE_PROVIDER_SITE_OTHER): Payer: Self-pay

## 2011-10-02 ENCOUNTER — Ambulatory Visit (INDEPENDENT_AMBULATORY_CARE_PROVIDER_SITE_OTHER): Payer: Medicare Other | Admitting: Physician Assistant

## 2011-10-02 VITALS — BP 132/86 | Ht 69.0 in | Wt 329.2 lb

## 2011-10-02 DIAGNOSIS — Z4651 Encounter for fitting and adjustment of gastric lap band: Secondary | ICD-10-CM

## 2011-10-02 NOTE — Patient Instructions (Signed)
Take clear liquids tonight. Thin protein shakes are ok to start tomorrow morning. Slowly advance your diet thereafter. Call us if you have persistent vomiting or regurgitation, night cough or reflux symptoms. Return as scheduled or sooner if you notice no changes in hunger/portion sizes.  

## 2011-10-02 NOTE — Progress Notes (Signed)
  HISTORY: Mathew Padilla is a 61 y.o.male who received an AP-Large lap-band in August 2010 by Dr. Ezzard Standing. He comes in today after a one month lap band holiday with the predicted hunger, large portion sizes and weight gain. He denies further hiccups or reflux.  VITAL SIGNS: Filed Vitals:   10/02/11 1228  BP: 132/86    PHYSICAL EXAM: Physical exam reveals a very well-appearing 60 y.o.male in no apparent distress Neurologic: Awake, alert, oriented Psych: Bright affect, conversant Respiratory: Breathing even and unlabored. No stridor or wheezing Abdomen: Soft, nontender, nondistended to palpation. Incisions well-healed. No incisional hernias. Port easily palpated. Extremities: Atraumatic, good range of motion.  ASSESMENT: 61 y.o.  male  s/p AP-Large lap-band.   PLAN: The patient's port was accessed with a 20G Huber needle without difficulty. Clear fluid was aspirated and 6 mL saline was added to the port to give a total predicted volume of 7 mL. The patient was able to swallow water without difficulty following the procedure and was instructed to take clear liquids for the next 24-48 hours and advance slowly as tolerated.

## 2011-11-03 ENCOUNTER — Other Ambulatory Visit: Payer: Self-pay | Admitting: Vascular Surgery

## 2011-11-05 ENCOUNTER — Encounter: Payer: Self-pay | Admitting: Vascular Surgery

## 2011-11-06 ENCOUNTER — Encounter: Payer: Self-pay | Admitting: Vascular Surgery

## 2011-11-06 ENCOUNTER — Ambulatory Visit (INDEPENDENT_AMBULATORY_CARE_PROVIDER_SITE_OTHER): Payer: Medicare Other | Admitting: Vascular Surgery

## 2011-11-06 ENCOUNTER — Ambulatory Visit
Admission: RE | Admit: 2011-11-06 | Discharge: 2011-11-06 | Disposition: A | Discharge status: Home / Self Care | Payer: Medicare Other | Source: Ambulatory Visit | Attending: Vascular Surgery | Admitting: Vascular Surgery

## 2011-11-06 VITALS — BP 143/82 | HR 87 | Temp 98.4°F | Ht 71.0 in | Wt 332.0 lb

## 2011-11-06 DIAGNOSIS — Z48812 Encounter for surgical aftercare following surgery on the circulatory system: Secondary | ICD-10-CM

## 2011-11-06 DIAGNOSIS — I724 Aneurysm of artery of lower extremity: Secondary | ICD-10-CM

## 2011-11-06 DIAGNOSIS — I714 Abdominal aortic aneurysm, without rupture: Secondary | ICD-10-CM

## 2011-11-06 DIAGNOSIS — I70219 Atherosclerosis of native arteries of extremities with intermittent claudication, unspecified extremity: Secondary | ICD-10-CM

## 2011-11-06 DIAGNOSIS — Z95828 Presence of other vascular implants and grafts: Secondary | ICD-10-CM

## 2011-11-06 MED ORDER — IOHEXOL 350 MG/ML SOLN
175.0000 mL | Freq: Once | INTRAVENOUS | Status: AC | PRN
  Administered 2011-11-06: 175 mL via INTRAVENOUS

## 2011-11-06 NOTE — Progress Notes (Signed)
[facility] [place] HISTORY AND PHYSICAL    History of Present Illness:  Patient is a 61 y.o.61 y.o. year old male who presents for follow-up evaluation of AAA. He underwent Zenith aneurysm stent graft repair in 2007. The patient denies new abdominal or back pain. She has also undergone a left-to-right femoral-femoral bypass for limb occlusion of a Zenith graft. At the same time he had a right femoral to below-knee popliteal bypass with propaten. This was in December of 2012. He subsequently underwent a left above-knee to below-knee popliteal bypass with vein in March of 2013. He reports no claudication symptoms. He does have some intermittent swelling of his lower extremities. The patient's atherosclerotic risk factors remain obesity, hypertension.  These are all currently stable and followed by his primary care physician.   Past Medical History  Diagnosis Date  . Arteriosclerotic cardiovascular disease (ASCVD) 2000 he    MI in 1999; PTCA of PDA and 2nd marginal in 3/00  . AAA (abdominal aortic aneurysm) 2007    stent graft repair 12/07  . Laryngeal carcinoma 2010    resection and radiation therapy 2010-11  . Diverticulitis 2006  . Obesity     BMI 47; lap band surgery 2010  . Gastroesophageal reflux disease     Hiatal hernia; previously treated with PPI  . Tobacco abuse, in remission     40 pack years; discontinued in 2000  . Cerebrovascular disease     carotid stent  . Cholelithiasis 11/28/2010  . Elevated PSA 06/2011  . Sleep apnea     BiPAP mask utilized        2 yrs sleep study    dr Andrey Campanile  . Hypertension     Mild; controlled with a single agent       dr Dietrich Pates      cardiac md  . Cough, persistent   . Popliteal aneurysm     popliteal bypass-2013     Past Surgical History  Procedure Date  . Peripheral arterial stent graft 2007    for abdominal aortic aneurysm  . Larynx surgery 2010    Resection of carcinoma  . Open anterior shoulder reconstruction 2011      Left  . Laparoscopic gastric banding 2010  . Eye surgery   . Cataract extraction   . Colonoscopy 10/2007  . Femoral-femoral bypass graft 04/18/2011    Procedure: BYPASS GRAFT FEMORAL-FEMORAL ARTERY;  Surgeon: Juleen China, MD;  Location: [facility] OR;  Service: Vascular;  Laterality: Bilateral;  Left to right femoral to femoral bypass   . Femoral-popliteal bypass graft 04/18/2011    Procedure: BYPASS GRAFT FEMORAL-POPLITEAL ARTERY;  Surgeon: Juleen China, MD;  Location: [facility] OR;  Service: Vascular;  Laterality: Right;  Right femoral popliteal bypass with propaten gortex graft  . Cardiac catheterization 2000    PCI-stent  . Pr vein bypass graft,aorto-fem-pop 07-23-11    Left AK to BK popliteal BPG using rev. saphenous vein       Review of Systems:  Neurologic: denies symptoms of TIA, amaurosis, or stroke Cardiac:denies shortness of breath or chest pain Pulmonary: denies cough or wheeze Abdomen: denies abdominal pain nausea or vomiting  History   Social History  . Marital Status: Married    Spouse Name: N/A    Number of Children: N/A  . Years of Education: N/A   Occupational History  . Not on file.   Social History Main Topics  . Smoking status: Former Smoker    Quit date:  11/26/1997  . Smokeless tobacco: Never Used   Comment: 40 pack years; quit 2000  . Alcohol Use: No  . Drug Use: No  . Sexually Active: Yes   Other Topics Concern  . Not on file   Social History Narrative   Married, past employed, does not get regular exercise.     Allergies  Allergen Reactions  . Aspirin     Daily dose upsets stomach  . Statins Hives and Rash    Current Outpatient Prescriptions on File Prior to Visit  Medication Sig Dispense Refill  . aspirin EC 81 MG tablet Take 81 mg by mouth every other day.       . CYMBALTA 30 MG capsule Daily.      Marland Kitchen lisinopril (PRINIVIL,ZESTRIL) 40 MG tablet Take 40 mg by mouth daily.        Marland Kitchen omeprazole (PRILOSEC) 20 MG capsule Take 20 mg by mouth  daily.       . potassium chloride (K-DUR,KLOR-CON) 10 MEQ tablet Take by mouth 2 (two) times daily.       Marland Kitchen senna (SENOKOT) 8.6 MG TABS Take 2 tablets by mouth daily as needed. For constipation      . Tamsulosin HCl (FLOMAX) 0.4 MG CAPS Take 0.4 mg by mouth daily after supper.        . triazolam (HALCION) 0.25 MG tablet Take 0.25 mg by mouth at bedtime as needed. For sleep       No current facility-administered medications on file prior to visit.       Physical Examination    Filed Vitals:   11/06/11 0950  BP: 143/82  Pulse: 87  Temp: 98.4 F (36.9 C)  TempSrc: Oral  Height: 5\' 11"  (1.803 m)  Weight: 332 lb (150.594 kg)  SpO2: 96%     General:  Alert and oriented, no acute distress HEENT: Normal Neck: No bruit or JVD Pulmonary: Clear to auscultation bilaterally Cardiac: Regular Rate and Rhythm without murmur Abdomen: Soft, non-tender, non-distended, normal bowel sounds, no pulsatile mass Extremities: 2+ femoral pulses, vaguely palpable dorsalis pedis pulse bilaterally. Trace edema. Skin: Feet pink warm and well-perfused   DATA:  CT angiogram the abdomen and pelvis images were reviewed today. Current aneurysm diameter is  3.8 cm compared to 5.3 cm preoperatively.  There is no  evidence of endoleak. The top portion of the stent graft is adjacent to the renal arteries and there is no evidence of migration. The left and right internal iliac arteries are patent. The stent graft extends to the iliac bifurcation bilaterally. There is a left-to-right femoral-femoral bypass which is patent. The right femoral to below-knee popliteal bypass is patent. There is three-vessel runoff to the right foot. The left above-knee to below-knee popliteal bypass is patent with three-vessel runoff to the left foot.   ASSESSMENT:  Doing well status post Zenith stent graft repair aneurysm as well as bilateral popliteal aneurysm repairs   PLAN: Patient will return in one year to review his stent graft  with an ultrasound in the office.  He will have a graft duplex scan of both lower extremity bypasses in September 2013. He will be in our graft surveillance protocol.  Fabienne Bruns, MD [facility] [place] Office: [phone] Pager: [phone]

## 2011-12-04 ENCOUNTER — Encounter (INDEPENDENT_AMBULATORY_CARE_PROVIDER_SITE_OTHER): Payer: Medicare Other

## 2012-01-01 ENCOUNTER — Encounter (INDEPENDENT_AMBULATORY_CARE_PROVIDER_SITE_OTHER): Payer: Self-pay

## 2012-01-01 ENCOUNTER — Ambulatory Visit (INDEPENDENT_AMBULATORY_CARE_PROVIDER_SITE_OTHER): Payer: Medicare Other | Admitting: Physician Assistant

## 2012-01-01 VITALS — BP 162/90 | Ht 69.0 in | Wt 326.2 lb

## 2012-01-01 DIAGNOSIS — Z4651 Encounter for fitting and adjustment of gastric lap band: Secondary | ICD-10-CM

## 2012-01-01 NOTE — Progress Notes (Signed)
  HISTORY: Mathew Padilla is a 61 y.o.male who received an AP-Large lap-band in August 2010 by Dr. Ezzard Standing. He comes in today with complaints of eating larger portion sizes but no persistent GERD or hiccups. He's had one episode of hiccups which self-resolved. No regurgitation.  VITAL SIGNS: Filed Vitals:   01/01/12 1006  BP: 162/90    PHYSICAL EXAM: Physical exam reveals a very well-appearing 61 y.o.male in no apparent distress Neurologic: Awake, alert, oriented Psych: Bright affect, conversant Respiratory: Breathing even and unlabored. No stridor or wheezing Abdomen: Soft, nontender, nondistended to palpation. Incisions well-healed. No incisional hernias. Port easily palpated. Extremities: Atraumatic, good range of motion.  ASSESMENT: 61 y.o.  male  s/p AP-Large lap-band.   PLAN: The patient's port was accessed with a 20G Huber needle without difficulty. Clear fluid was aspirated and 0.5 mL saline was added to the port to give a total predicted volume of 7.5 mL. The patient was able to swallow water without difficulty following the procedure and was instructed to take clear liquids for the next 24-48 hours and advance slowly as tolerated.

## 2012-01-01 NOTE — Patient Instructions (Signed)
Take clear liquids tonight. Thin protein shakes are ok to start tomorrow morning. Slowly advance your diet thereafter. Call us if you have persistent vomiting or regurgitation, night cough or reflux symptoms. Return as scheduled or sooner if you notice no changes in hunger/portion sizes.  

## 2012-01-05 ENCOUNTER — Ambulatory Visit (HOSPITAL_COMMUNITY)
Admission: RE | Admit: 2012-01-05 | Discharge: 2012-01-05 | Disposition: A | Discharge status: Home / Self Care | Payer: Medicare Other | Source: Ambulatory Visit | Attending: Pulmonary Disease | Admitting: Pulmonary Disease

## 2012-01-05 ENCOUNTER — Other Ambulatory Visit (HOSPITAL_COMMUNITY): Payer: Self-pay | Admitting: Pulmonary Disease

## 2012-01-05 DIAGNOSIS — R0602 Shortness of breath: Secondary | ICD-10-CM

## 2012-01-09 ENCOUNTER — Other Ambulatory Visit: Payer: Self-pay | Admitting: *Deleted

## 2012-01-09 DIAGNOSIS — Z48812 Encounter for surgical aftercare following surgery on the circulatory system: Secondary | ICD-10-CM

## 2012-01-09 DIAGNOSIS — I739 Peripheral vascular disease, unspecified: Secondary | ICD-10-CM

## 2012-02-13 ENCOUNTER — Ambulatory Visit (INDEPENDENT_AMBULATORY_CARE_PROVIDER_SITE_OTHER): Payer: Medicare Other | Admitting: Cardiovascular Disease

## 2012-02-13 ENCOUNTER — Encounter: Payer: Self-pay | Admitting: Cardiovascular Disease

## 2012-02-13 VITALS — BP 127/80 | HR 79 | Ht 71.0 in | Wt 325.0 lb

## 2012-02-13 DIAGNOSIS — I739 Peripheral vascular disease, unspecified: Secondary | ICD-10-CM

## 2012-02-13 DIAGNOSIS — I10 Essential (primary) hypertension: Secondary | ICD-10-CM

## 2012-02-13 DIAGNOSIS — I2510 Atherosclerotic heart disease of native coronary artery without angina pectoris: Secondary | ICD-10-CM

## 2012-02-13 NOTE — Patient Instructions (Addendum)
Your physician wants you to follow-up in: 6 months.   You will receive a reminder letter in the mail two months in advance. If you don't receive a letter, please call our office to schedule the follow-up appointment.  Your physician has requested that you have a lexiscan myoview. For further information please visit [email or web address]. Please follow instruction sheet, as given.   

## 2012-02-13 NOTE — Progress Notes (Signed)
History of Present Illness: [name] with history of CAD, PAD, HTN, OSA who is here today for cardiac follow up. He has been followed in the past by Dr. Dietrich Pates but wishes to move his care to our office here in [place]. His coronary history includes MI in 1999 with stent placement ? Vessel following tPA and then balloon angioplasty of the PDA and second marginal in March of 2000. He did have a stress test in November 2011 which was normal. Admitted December 2012 to [facility] service with an ischemic right leg. He had undergone prior aortic Zenith aneurysm stent graft repair in 2007. Was seen urgently by Dr. Myra Gianotti in December 2012 and was found to have occluded right limb of stent graft as well as thrombosed popliteal aneurysm right leg. He underwent  femoral-femoral bypass grafting and a femoral to below knee popliteal artery bypass graft with Gore-Tex. Readmitted March 2013 and underwent ligation of left popliteal aneurysm with left above-knee to below-knee popliteal bypass with reversed greater saphenous vein by Dr. Darrick Penna.  He is here today for cardiac follow up. He tells me that he has been having a pinching type chest pains over the last few months, every other day. He has had dyspnea which seems to be worsening. He is fairly inactive due to his vascular disease.   Primary Care Physician: Juanetta Gosling  Last Lipid Profile:Lipid Panel     Component Value Date/Time   CHOL 187 03/19/2010 2015   TRIG 216* 03/19/2010 2015   HDL 35* 03/19/2010 2015   CHOLHDL 5.3 Ratio 03/19/2010 2015   VLDL 43* 03/19/2010 2015   LDLCALC 109* 03/19/2010 2015     Past Medical History  Diagnosis Date  . Arteriosclerotic cardiovascular disease (ASCVD) 2000 he    MI in 1999; PTCA of PDA and 2nd marginal in 3/00  . AAA (abdominal aortic aneurysm) 2007    stent graft repair 12/07  . Laryngeal carcinoma 2010    resection and radiation therapy 2010-11  . Diverticulitis 2006  . Obesity     BMI 47; lap band surgery  2010  . Gastroesophageal reflux disease     Hiatal hernia; previously treated with PPI  . Tobacco abuse, in remission     40 pack years; discontinued in 2000  . Cerebrovascular disease   . Cholelithiasis 11/28/2010  . Elevated PSA 06/2011  . Sleep apnea     BiPAP mask utilized        2 yrs sleep study    dr Andrey Campanile  . Hypertension     Mild; controlled with a single agent       dr Dietrich Pates      cardiac md  . Cough, persistent   . 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 Peripheral arterial stent graft 2007    for abdominal aortic aneurysm  . Larynx surgery 2010    Resection of carcinoma  . Open anterior shoulder reconstruction 2011    Left  . Laparoscopic gastric banding 2010  . Eye surgery   . Cataract extraction   . Colonoscopy 10/2007  . Femoral-femoral bypass graft 04/18/2011    Procedure: BYPASS GRAFT FEMORAL-FEMORAL ARTERY;  Surgeon: Juleen China, MD;  Location: [facility] OR;  Service: Vascular;  Laterality: Bilateral;  Left to right femoral to femoral bypass   . Femoral-popliteal bypass graft 04/18/2011    Procedure: BYPASS GRAFT FEMORAL-POPLITEAL ARTERY;  Surgeon: Juleen China, MD;  Location:  [facility] OR;  Service: Vascular;  Laterality: Right;  Right femoral popliteal bypass with propaten gortex graft  . Cardiac catheterization 2000    PCI-stent  . Pr vein bypass graft,aorto-fem-pop 07-23-11    Left AK to BK popliteal BPG using rev. saphenous vein    Current Outpatient Prescriptions  Medication Sig Dispense Refill  . aspirin EC 81 MG tablet Take 81 mg by mouth every other day.       . CYMBALTA 30 MG capsule Daily.      Marland Kitchen lisinopril (PRINIVIL,ZESTRIL) 40 MG tablet Take 40 mg by mouth daily.        Marland Kitchen omeprazole (PRILOSEC) 20 MG capsule Take 20 mg by mouth daily.       . potassium chloride (K-DUR,KLOR-CON) 10 MEQ tablet Take by mouth 2 (two) times daily.       Marland Kitchen senna (SENOKOT) 8.6 MG TABS Take 2 tablets by mouth daily as needed.  For constipation      . Tamsulosin HCl (FLOMAX) 0.4 MG CAPS Take 0.4 mg by mouth daily after supper.        . testosterone cypionate (DEPOTESTOTERONE CYPIONATE) 200 MG/ML injection 1 INJECTION EVERY TWO WEEKS      . triazolam (HALCION) 0.25 MG tablet Take 0.25 mg by mouth at bedtime as needed. For sleep        Allergies  Allergen Reactions  . Aspirin     Daily dose upsets stomach  . Statins Hives and Rash    History   Social History  . Marital Status: Married    Spouse Name: N/A    Number of Children: 3  . Years of Education: N/A   Occupational History  . Retired, part Regulatory affairs officer    Social History Main Topics  . Smoking status: Former Smoker -- 2.0 packs/day for 30 years    Types: Cigarettes    Quit date: 11/26/1997  . Smokeless tobacco: Never Used   Comment: 40 pack years; quit 2000  . Alcohol Use: No  . Drug Use: No  . Sexually Active: Yes   Other Topics Concern  . Not on file   Social History Narrative   Married, past employed, does not get regular exercise.     Family History  Problem Relation Age of Onset  . Heart attack Father     deceased  . Heart failure Father   . Hyperlipidemia Father   . Hypertension Father   . Heart disease Father   . Lung cancer Mother     deceased   . Hypertension Mother   . Hyperlipidemia Mother   . Heart failure Mother   . Diabetes Mother   . Cancer Sister     vaginal  . Diabetes Sister     Review of Systems:  As stated in the HPI and otherwise negative.   BP 127/80  Pulse 79  Ht 5\' 11"  (1.803 m)  Wt 325 lb (147.419 kg)  BMI 45.33 kg/m2  Physical Examination: General: Well developed, well nourished, NAD HEENT: OP clear, mucus membranes moist SKIN: warm, dry. No rashes. Neuro: No focal deficits Musculoskeletal: Muscle strength 5/5 all ext Psychiatric: Mood and affect normal Neck: No JVD, no carotid bruits, no thyromegaly, no lymphadenopathy. Lungs:Clear bilaterally, no wheezes, rhonci,  crackles Cardiovascular: Regular rate and rhythm. No murmurs, gallops or rubs. Abdomen:Soft. Bowel sounds present. Non-tender.  Extremities: No lower extremity edema. Pulses are 2 + in the bilateral DP/PT.  EKG: NSR, rate 75 bpm. IVCD. Inferior infarct.   Assessment  and Plan:   1. CAD: He has some chest pain and is known to have severe CAD. Will arrange Lexiscan myoview to exclude ischemia. He has not been on a beta blocker in the past . Does not tolerate statins. May benefit from Imdur. Will f/u on stress test then make recs.   2. HTN: BP controlled.   3. PAD: per [facility]

## 2012-02-18 ENCOUNTER — Ambulatory Visit (HOSPITAL_COMMUNITY): Payer: Medicare Other | Attending: Cardiovascular Disease | Admitting: Radiology

## 2012-02-18 VITALS — BP 131/77 | Ht 71.0 in | Wt 326.0 lb

## 2012-02-18 DIAGNOSIS — Z8249 Family history of ischemic heart disease and other diseases of the circulatory system: Secondary | ICD-10-CM | POA: Insufficient documentation

## 2012-02-18 DIAGNOSIS — I739 Peripheral vascular disease, unspecified: Secondary | ICD-10-CM | POA: Insufficient documentation

## 2012-02-18 DIAGNOSIS — I2510 Atherosclerotic heart disease of native coronary artery without angina pectoris: Secondary | ICD-10-CM

## 2012-02-18 DIAGNOSIS — R079 Chest pain, unspecified: Secondary | ICD-10-CM

## 2012-02-18 DIAGNOSIS — I10 Essential (primary) hypertension: Secondary | ICD-10-CM | POA: Insufficient documentation

## 2012-02-18 DIAGNOSIS — R0989 Other specified symptoms and signs involving the circulatory and respiratory systems: Secondary | ICD-10-CM | POA: Insufficient documentation

## 2012-02-18 DIAGNOSIS — R002 Palpitations: Secondary | ICD-10-CM | POA: Insufficient documentation

## 2012-02-18 DIAGNOSIS — R0609 Other forms of dyspnea: Secondary | ICD-10-CM | POA: Insufficient documentation

## 2012-02-18 MED ORDER — REGADENOSON 0.4 MG/5ML IV SOLN
0.4000 mg | Freq: Once | INTRAVENOUS | Status: AC
  Administered 2012-02-23: 0.4 mg via INTRAVENOUS

## 2012-02-18 MED ORDER — TECHNETIUM TC 99M SESTAMIBI GENERIC - CARDIOLITE: 30.0000 | Freq: Once | INTRAVENOUS | Status: AC | PRN

## 2012-02-18 NOTE — Progress Notes (Signed)
[facility] SITE 3 NUCLEAR MED [id number] [place] [address] [phone]  Cardiology Nuclear Med Study  Mathew Padilla is a 61 y.o. male     MRN : [id number]     DOB: December 04, 1950  Procedure Date: 02/18/2012  Nuclear Med Background Indication for Stress Test:  Evaluation for Ischemia, Stent Patency and PTCA Patency History:  1999: MI-Stent,2000: Heart Cath: PDA/2nd Marginal,Angioplasty: PDA  2nd Marginal  2007 AAA stent graft repair, 2009 ECHO: 03/19/10 GXT: NL Cardiac Risk Factors: Carotid Disease, Family History - CAD, History of Smoking, Hypertension, Lipids and PVD  Symptoms:  Chest Pain, DOE, Fatigue and Palpitations   Nuclear Pre-Procedure Caffeine/Decaff Intake:  None NPO After: 9:00pm   Lungs:  clear O2 Sat: 94% on room air. IV 0.9% NS with Angio Cath:  20g  IV Site: R Antecubital  IV Started by:  Milana Na, EMT-P  Chest Size (in): 54 Cup Size: n/a  Height: 5\' 11"  (1.803 m)  Weight:  326 lb (147.873 kg)  BMI:  Body mass index is 45.47 kg/(m^2). Tech Comments:  No Rx this am    Nuclear Med Study 1 or 2 day study: 2 day  Stress Test Type:  Lexiscan  Reading MD: Maisie Fus Landen Knoedler,M.D. Order Authorizing Provider:  C.McAlhany MD  Resting Radionuclide: Technetium 87m Sestamibi  Resting Radionuclide Dose: 33.0 mCi  On    02-23-12  Stress Radionuclide:  Technetium 61m Sestamibi  Stress Radionuclide Dose: 33.0 mCi    On      02-18-12          Stress Protocol Rest HR: 75 Stress HR: 79  Rest BP: 131/77 Stress BP: 137/81  Exercise Time (min): n/a METS: n/a   Predicted Max HR: 158 bpm % Max HR: 50 bpm Rate Pressure Product: 18841   Dose of Adenosine (mg):  n/a Dose of Lexiscan: 0.4 mg  Dose of Atropine (mg): n/a Dose of Dobutamine: n/a mcg/kg/min (at max HR)  Stress Test Technologist: Milana Na, EMT-P  Nuclear Technologist:  Domenic Polite, CNMT     Rest Procedure:  Myocardial perfusion imaging was performed at rest  45 minutes following the intravenous administration of Technetium 60m Sestamibi. Rest ECG: SR 1st degree AVB with pvc  Stress Procedure:  The patient received IV Lexiscan 0.4 mg over 15-seconds.  Technetium 33m Sestamibi injected at 30-seconds.  There were no significant changes, rare pvcs, and no symptoms with Lexiscan.  Quantitative spect images were obtained after a 45 minute delay. Stress ECG: No significant change from baseline ECG  QPS Raw Data Images:  Normal; no motion artifact; normal heart/lung ratio. Stress Images:  There is decreased uptake in the inferior wall. Rest Images:  There is decreased uptake in the inferior wall. Subtraction (SDS):  There is a fixed defect that is most consistent with a previous infarction. Transient Ischemic Dilatation (Normal <1.22):  1.07 Lung/Heart Ratio (Normal <0.45):  0.48  Quantitative Gated Spect Images QGS EDV:  192 ml QGS ESV:  119 ml  Impression Exercise Capacity:  Lexiscan with no exercise. BP Response:  Normal blood pressure response. Clinical Symptoms:  No significant symptoms noted. ECG Impression:  No significant ST segment change suggestive of ischemia. Comparison with Prior Nuclear Study: No images to compare  Overall Impression:  Abnormal stress nuclear study. There is a moderate sized scar of moderate severity involving the mid-inferior, basal inferior, mid-inferolateral and basal inferolateral segments.  There is minimal reversibility.  LV Ejection Fraction: 38%.  LV Wall Motion:  Mild inferior wall hypokinesis,.  Mathew Padilla

## 2012-02-19 ENCOUNTER — Ambulatory Visit: Payer: Medicare Other | Admitting: Neurosurgery

## 2012-02-23 ENCOUNTER — Ambulatory Visit (HOSPITAL_COMMUNITY): Payer: Medicare Other | Attending: Cardiology

## 2012-02-23 DIAGNOSIS — R0989 Other specified symptoms and signs involving the circulatory and respiratory systems: Secondary | ICD-10-CM | POA: Insufficient documentation

## 2012-02-23 DIAGNOSIS — I10 Essential (primary) hypertension: Secondary | ICD-10-CM | POA: Insufficient documentation

## 2012-02-23 DIAGNOSIS — E785 Hyperlipidemia, unspecified: Secondary | ICD-10-CM | POA: Insufficient documentation

## 2012-02-23 DIAGNOSIS — R079 Chest pain, unspecified: Secondary | ICD-10-CM | POA: Insufficient documentation

## 2012-02-23 DIAGNOSIS — I252 Old myocardial infarction: Secondary | ICD-10-CM | POA: Insufficient documentation

## 2012-02-23 DIAGNOSIS — I739 Peripheral vascular disease, unspecified: Secondary | ICD-10-CM | POA: Insufficient documentation

## 2012-02-23 DIAGNOSIS — Z87891 Personal history of nicotine dependence: Secondary | ICD-10-CM | POA: Insufficient documentation

## 2012-02-23 DIAGNOSIS — R0609 Other forms of dyspnea: Secondary | ICD-10-CM | POA: Insufficient documentation

## 2012-02-23 DIAGNOSIS — L98499 Non-pressure chronic ulcer of skin of other sites with unspecified severity: Secondary | ICD-10-CM | POA: Insufficient documentation

## 2012-02-23 DIAGNOSIS — R002 Palpitations: Secondary | ICD-10-CM | POA: Insufficient documentation

## 2012-02-23 DIAGNOSIS — Z9861 Coronary angioplasty status: Secondary | ICD-10-CM | POA: Insufficient documentation

## 2012-02-23 DIAGNOSIS — Z8249 Family history of ischemic heart disease and other diseases of the circulatory system: Secondary | ICD-10-CM | POA: Insufficient documentation

## 2012-02-23 MED ORDER — TECHNETIUM TC 99M SESTAMIBI GENERIC - CARDIOLITE
30.0000 | Freq: Once | INTRAVENOUS | Status: AC | PRN
  Administered 2012-02-23: 30 via INTRAVENOUS

## 2012-02-25 ENCOUNTER — Telehealth: Payer: Self-pay | Admitting: Cardiovascular Disease

## 2012-02-25 NOTE — Telephone Encounter (Signed)
Left message to call us back. He needs f/u appt next week with me to discuss stress test, possible cath. Thayer Ohm

## 2012-02-26 ENCOUNTER — Telehealth: Payer: Self-pay | Admitting: Cardiovascular Disease

## 2012-02-26 NOTE — Telephone Encounter (Signed)
Spoke with wife and reviewed stress test results. Appt made for pt to see Dr. Clifton James on March 05, 2012 at 8:30

## 2012-02-26 NOTE — Telephone Encounter (Signed)
Wife aware and appt made for March 05, 2012

## 2012-02-26 NOTE — Telephone Encounter (Signed)
Left message to call back.  Pt can be seen AM of March 05, 2012

## 2012-02-26 NOTE — Telephone Encounter (Signed)
New problem    Returning call back to nurse.   

## 2012-03-05 ENCOUNTER — Encounter (HOSPITAL_COMMUNITY): Payer: Self-pay | Admitting: Pharmacy Technician

## 2012-03-05 ENCOUNTER — Encounter: Payer: Self-pay | Admitting: *Deleted

## 2012-03-05 ENCOUNTER — Encounter: Payer: Self-pay | Admitting: Cardiovascular Disease

## 2012-03-05 ENCOUNTER — Ambulatory Visit (INDEPENDENT_AMBULATORY_CARE_PROVIDER_SITE_OTHER): Payer: Medicare Other | Admitting: Cardiovascular Disease

## 2012-03-05 VITALS — BP 125/70 | HR 84 | Wt 327.0 lb

## 2012-03-05 DIAGNOSIS — R0989 Other specified symptoms and signs involving the circulatory and respiratory systems: Secondary | ICD-10-CM

## 2012-03-05 DIAGNOSIS — R9430 Abnormal result of cardiovascular function study, unspecified: Secondary | ICD-10-CM

## 2012-03-05 DIAGNOSIS — I2510 Atherosclerotic heart disease of native coronary artery without angina pectoris: Secondary | ICD-10-CM

## 2012-03-05 DIAGNOSIS — R079 Chest pain, unspecified: Secondary | ICD-10-CM

## 2012-03-05 LAB — BASIC METABOLIC PANEL
BUN: 13 mg/dL (ref 6–23)
Chloride: 106 mEq/L (ref 96–112)
Creatinine, Ser: 0.9 mg/dL (ref 0.4–1.5)

## 2012-03-05 LAB — CBC WITH DIFFERENTIAL/PLATELET
Basophils Absolute: 0 10*3/uL (ref 0.0–0.1)
Eosinophils Absolute: 0.2 10*3/uL (ref 0.0–0.7)
Eosinophils Relative: 4 % (ref 0.0–5.0)
MCHC: 32.4 g/dL (ref 30.0–36.0)
MCV: 93.5 fl (ref 78.0–100.0)
Monocytes Absolute: 0.7 10*3/uL (ref 0.1–1.0)
Neutrophils Relative %: 57.9 % (ref 43.0–77.0)
Platelets: 172 10*3/uL (ref 150.0–400.0)
RDW: 15.9 % — ABNORMAL HIGH (ref 11.5–14.6)
WBC: 5.6 10*3/uL (ref 4.5–10.5)

## 2012-03-05 LAB — PROTIME-INR
INR: 1.1 ratio — ABNORMAL HIGH (ref 0.8–1.0)
Prothrombin Time: 12 s (ref 10.2–12.4)

## 2012-03-05 NOTE — Patient Instructions (Addendum)
Your physician recommends that you schedule a follow-up appointment in:  6 weeks.   Your physician has requested that you have a carotid duplex. This test is an ultrasound of the carotid arteries in your neck. It looks at blood flow through these arteries that supply the brain with blood. Allow one hour for this exam. There are no restrictions or special instructions.   Your physician has requested that you have a cardiac catheterization. Cardiac catheterization is used to diagnose and/or treat various heart conditions. Doctors may recommend this procedure for a number of different reasons. The most common reason is to evaluate chest pain. Chest pain can be a symptom of coronary artery disease (CAD), and cardiac catheterization can show whether plaque is narrowing or blocking your heart's arteries. This procedure is also used to evaluate the valves, as well as measure the blood flow and oxygen levels in different parts of your heart. For further information please visit [email or web address] Please follow instruction sheet, as given. Scheduled for March 10, 2012

## 2012-03-05 NOTE — Progress Notes (Signed)
 History of Present Illness: [name] with history of CAD, PAD, HTN, OSA who is here today for cardiac follow up. He has been followed in the past by Dr. Rothbart but wishes to move his care to our office here in [place]. His coronary history includes MI in 1999 with stent placement ? Vessel following tPA and then balloon angioplasty of the PDA and second marginal in March of 2000. He did have a stress test in November 2011 which was normal. Admitted December 2012 to [facility] service with an ischemic right leg. He had undergone prior aortic Zenith aneurysm stent graft repair in 2007. Was seen urgently by Dr. Brabham in December 2012 and was found to have occluded right limb of stent graft as well as thrombosed popliteal aneurysm right leg. He underwent femoral-femoral bypass grafting and a femoral to below knee popliteal artery bypass graft with Gore-Tex. Readmitted March 2013 and underwent ligation of left popliteal aneurysm with left above-knee to below-knee popliteal bypass with reversed greater saphenous vein by Dr. Fields. I saw him 02/13/12 and he had c/o pinching type chest pains for a few months, occurring several times per week as well as worsening dyspnea. I arranged a Lexiscan stress myoview on 02/18/12 which showed a moderate sized scar of moderate severity involving the mid-inferior, basal inferior, mid-inferolateral and basal inferolateral segments. There was minimal reversibility. LVEF=38%.   He is here today for follow up. His symptoms have not changed. He is still having chest pains and dyspnea. Overall energy level is down.   Primary Care Physician: Hawkins   Past Medical History  Diagnosis Date  . Arteriosclerotic cardiovascular disease (ASCVD) 2000 he    MI in 1999; PTCA of PDA and 2nd marginal in 3/00  . AAA (abdominal aortic aneurysm) 2007    stent graft repair 12/07  . Laryngeal carcinoma 2010    resection and radiation therapy 2010-11  . Diverticulitis 2006  . Obesity    BMI 47; lap band surgery 2010  . Gastroesophageal reflux disease     Hiatal hernia; previously treated with PPI  . Tobacco abuse, in remission     40 pack years; discontinued in 2000  . Cerebrovascular disease   . Cholelithiasis 11/28/2010  . Elevated PSA 06/2011  . Sleep apnea     BiPAP mask utilized        2 yrs sleep study    dr wilson  . Hypertension     Mild; controlled with a single agent       dr rothbart      cardiac md  . Cough, persistent   . 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 Peripheral arterial stent graft 2007    for abdominal aortic aneurysm  . Larynx surgery 2010    Resection of carcinoma  . Open anterior shoulder reconstruction 2011    Left  . Laparoscopic gastric banding 2010  . Eye surgery   . Cataract extraction   . Colonoscopy 10/2007  . Femoral-femoral bypass graft 04/18/2011    Procedure: BYPASS GRAFT FEMORAL-FEMORAL ARTERY;  Surgeon: V Wells Brabham, MD;  Location: [facility] OR;  Service: Vascular;  Laterality: Bilateral;  Left to right femoral to femoral bypass   . Femoral-popliteal bypass graft 04/18/2011    Procedure: BYPASS GRAFT FEMORAL-POPLITEAL ARTERY;  Surgeon: V Wells Brabham, MD;  Location: [facility] OR;  Service: Vascular;  Laterality: Right;  Right femoral popliteal bypass with propaten gortex graft  .   Cardiac catheterization 2000    PCI-stent  . Pr vein bypass graft,aorto-fem-pop 07-23-11    Left AK to BK popliteal BPG using rev. saphenous vein    Current Outpatient Prescriptions  Medication Sig Dispense Refill  . aspirin EC 81 MG tablet Take 81 mg by mouth every other day.       . CYMBALTA 30 MG capsule Daily.      . lisinopril (PRINIVIL,ZESTRIL) 40 MG tablet Take 40 mg by mouth daily.        . omeprazole (PRILOSEC) 20 MG capsule Take 20 mg by mouth daily.       . potassium chloride (K-DUR,KLOR-CON) 10 MEQ tablet Take by mouth 2 (two) times daily.       . senna (SENOKOT) 8.6 MG TABS Take 2 tablets  by mouth daily as needed. For constipation      . Tamsulosin HCl (FLOMAX) 0.4 MG CAPS Take 0.4 mg by mouth daily after supper.        . testosterone cypionate (DEPOTESTOTERONE CYPIONATE) 200 MG/ML injection 1 INJECTION EVERY TWO WEEKS      . triazolam (HALCION) 0.25 MG tablet Take 0.25 mg by mouth at bedtime as needed. For sleep        Allergies  Allergen Reactions  . Aspirin     Daily dose upsets stomach  . Statins Hives and Rash    History   Social History  . Marital Status: Married    Spouse Name: N/A    Number of Children: 3  . Years of Education: N/A   Occupational History  . Retired, part time driver    Social History Main Topics  . Smoking status: Former Smoker -- 2.0 packs/day for 30 years    Types: Cigarettes    Quit date: 11/26/1997  . Smokeless tobacco: Never Used     Comment: 40 pack years; quit 2000  . Alcohol Use: No  . Drug Use: No  . Sexually Active: Yes   Other Topics Concern  . Not on file   Social History Narrative   Married, past employed, does not get regular exercise.     Family History  Problem Relation Age of Onset  . Heart attack Father     deceased  . Heart failure Father   . Hyperlipidemia Father   . Hypertension Father   . Heart disease Father   . Lung cancer Mother     deceased   . Hypertension Mother   . Hyperlipidemia Mother   . Heart failure Mother   . Diabetes Mother   . Cancer Sister     vaginal  . Diabetes Sister     Review of Systems:  As stated in the HPI and otherwise negative.   BP 125/70  Pulse 84  Wt 327 lb (148.326 kg)  Physical Examination: General: Well developed, well nourished, NAD HEENT: OP clear, mucus membranes moist SKIN: warm, dry. No rashes. Neuro: No focal deficits Musculoskeletal: Muscle strength 5/5 all ext Psychiatric: Mood and affect normal Neck: No JVD, no carotid bruits, no thyromegaly, no lymphadenopathy. Lungs:Clear bilaterally, no wheezes, rhonci, crackles Cardiovascular: Regular  rate and rhythm. No murmurs, gallops or rubs. Abdomen:Soft. Bowel sounds present. Non-tender.  Extremities: No lower extremity edema.   EKG: NSR, rate 86 bpm. Non-specific ST changes inferior leads with small Q waves.   Lexiscan Stress Myoview: 02/18/12:  Stress Procedure: The patient received IV Lexiscan 0.4 mg over 15-seconds. Technetium 99m Sestamibi injected at 30-seconds. There were no significant changes, rare pvcs,   and no symptoms with Lexiscan. Quantitative spect images were obtained after a 45 minute delay.  Stress ECG: No significant change from baseline ECG  QPS  Raw Data Images: Normal; no motion artifact; normal heart/lung ratio.  Stress Images: There is decreased uptake in the inferior wall.  Rest Images: There is decreased uptake in the inferior wall.  Subtraction (SDS): There is a fixed defect that is most consistent with a previous infarction.  Transient Ischemic Dilatation (Normal <1.22): 1.07  Lung/Heart Ratio (Normal <0.45): 0.48  Quantitative Gated Spect Images  QGS EDV: 192 ml  QGS ESV: 119 ml  Impression  Exercise Capacity: Lexiscan with no exercise.  BP Response: Normal blood pressure response.  Clinical Symptoms: No significant symptoms noted.  ECG Impression: No significant ST segment change suggestive of ischemia.  Comparison with Prior Nuclear Study: No images to compare  Overall Impression: Abnormal stress nuclear study. There is a moderate sized scar of moderate severity involving the mid-inferior, basal inferior, mid-inferolateral and basal inferolateral segments. There is minimal reversibility.  LV Ejection Fraction: 38%. LV Wall Motion: Mild inferior wall hypokinesis,.   Assessment and Plan:   1. CAD: He is known to have CAD and has had prior angioplasty. No cath in 13 years. Stress test is worrisome for inferior wall scar with possible ischemia. LVEF is down which is new. Will plan cath next week on 03/10/12 in the main cath lab. Right radial artery  approach. Risks and benefits reviewed with pt and wife including bleeding/infection/CVA/MI/contrast reaction, ARF and death. Pre-cath labs today. Recent CXR ok.   2. Carotid artery bruit: Will check carotid artery dopplers. High risk for carotid artery disease with known severe PAD/CAD.  3. PAD: Followed in [facility]VS 

## 2012-03-10 ENCOUNTER — Encounter (HOSPITAL_COMMUNITY)
Admission: RE | Disposition: A | Discharge status: Home / Self Care | Payer: Self-pay | Source: Ambulatory Visit | Attending: Cardiovascular Disease

## 2012-03-10 ENCOUNTER — Ambulatory Visit (HOSPITAL_COMMUNITY)
Admission: RE | Admit: 2012-03-10 | Discharge: 2012-03-10 | Disposition: A | Discharge status: Home / Self Care | Payer: Medicare Other | Source: Ambulatory Visit | Attending: Cardiovascular Disease | Admitting: Cardiovascular Disease

## 2012-03-10 DIAGNOSIS — G4733 Obstructive sleep apnea (adult) (pediatric): Secondary | ICD-10-CM | POA: Insufficient documentation

## 2012-03-10 DIAGNOSIS — R0989 Other specified symptoms and signs involving the circulatory and respiratory systems: Secondary | ICD-10-CM | POA: Insufficient documentation

## 2012-03-10 DIAGNOSIS — R0609 Other forms of dyspnea: Secondary | ICD-10-CM | POA: Insufficient documentation

## 2012-03-10 DIAGNOSIS — Z9861 Coronary angioplasty status: Secondary | ICD-10-CM | POA: Insufficient documentation

## 2012-03-10 DIAGNOSIS — I10 Essential (primary) hypertension: Secondary | ICD-10-CM | POA: Insufficient documentation

## 2012-03-10 DIAGNOSIS — I2582 Chronic total occlusion of coronary artery: Secondary | ICD-10-CM | POA: Insufficient documentation

## 2012-03-10 DIAGNOSIS — I252 Old myocardial infarction: Secondary | ICD-10-CM | POA: Insufficient documentation

## 2012-03-10 DIAGNOSIS — Z8521 Personal history of malignant neoplasm of larynx: Secondary | ICD-10-CM | POA: Insufficient documentation

## 2012-03-10 DIAGNOSIS — I2510 Atherosclerotic heart disease of native coronary artery without angina pectoris: Secondary | ICD-10-CM

## 2012-03-10 DIAGNOSIS — R079 Chest pain, unspecified: Secondary | ICD-10-CM | POA: Insufficient documentation

## 2012-03-10 DIAGNOSIS — I739 Peripheral vascular disease, unspecified: Secondary | ICD-10-CM | POA: Insufficient documentation

## 2012-03-10 HISTORY — PX: LEFT HEART CATHETERIZATION WITH CORONARY ANGIOGRAM: SHX5451

## 2012-03-10 SURGERY — LEFT HEART CATHETERIZATION WITH CORONARY ANGIOGRAM
Anesthesia: LOCAL

## 2012-03-10 MED ORDER — FENTANYL CITRATE 0.05 MG/ML IJ SOLN
INTRAMUSCULAR | Status: AC
  Filled 2012-03-10: qty 2

## 2012-03-10 MED ORDER — HEPARIN SODIUM (PORCINE) 1000 UNIT/ML IJ SOLN
INTRAMUSCULAR | Status: AC
  Filled 2012-03-10: qty 1

## 2012-03-10 MED ORDER — DIAZEPAM 5 MG PO TABS
5.0000 mg | ORAL_TABLET | ORAL | Status: AC
  Administered 2012-03-10: 5 mg via ORAL

## 2012-03-10 MED ORDER — ASPIRIN 81 MG PO CHEW
324.0000 mg | CHEWABLE_TABLET | ORAL | Status: AC
  Administered 2012-03-10: 324 mg via ORAL

## 2012-03-10 MED ORDER — DIAZEPAM 5 MG PO TABS
ORAL_TABLET | ORAL | Status: AC
  Administered 2012-03-10: 5 mg via ORAL
  Filled 2012-03-10: qty 1

## 2012-03-10 MED ORDER — SODIUM CHLORIDE 0.9 % IJ SOLN: 3.0000 mL | Freq: Two times a day (BID) | INTRAMUSCULAR | Status: DC

## 2012-03-10 MED ORDER — ASPIRIN 81 MG PO CHEW
CHEWABLE_TABLET | ORAL | Status: AC
  Administered 2012-03-10: 324 mg via ORAL
  Filled 2012-03-10: qty 4

## 2012-03-10 MED ORDER — VERAPAMIL HCL 2.5 MG/ML IV SOLN
INTRAVENOUS | Status: AC
  Filled 2012-03-10: qty 2

## 2012-03-10 MED ORDER — SODIUM CHLORIDE 0.9 % IV SOLN: 250.0000 mL | INTRAVENOUS | Status: DC | PRN

## 2012-03-10 MED ORDER — MIDAZOLAM HCL 2 MG/2ML IJ SOLN
INTRAMUSCULAR | Status: AC
  Filled 2012-03-10: qty 2

## 2012-03-10 MED ORDER — NITROGLYCERIN 0.2 MG/ML ON CALL CATH LAB
INTRAVENOUS | Status: AC
  Filled 2012-03-10: qty 1

## 2012-03-10 MED ORDER — SODIUM CHLORIDE 0.9 % IJ SOLN: 3.0000 mL | INTRAMUSCULAR | Status: DC | PRN

## 2012-03-10 MED ORDER — SODIUM CHLORIDE 0.9 % IV SOLN
INTRAVENOUS | Status: DC
  Administered 2012-03-10: 09:00:00 via INTRAVENOUS

## 2012-03-10 MED ORDER — HEPARIN (PORCINE) IN NACL 2-0.9 UNIT/ML-% IJ SOLN
INTRAMUSCULAR | Status: AC
  Filled 2012-03-10: qty 1000

## 2012-03-10 MED ORDER — LIDOCAINE HCL (PF) 1 % IJ SOLN
INTRAMUSCULAR | Status: AC
  Filled 2012-03-10: qty 30

## 2012-03-10 NOTE — Interval H&P Note (Signed)
History and Physical Interval Note:  03/10/2012 9:32 AM  Mathew Padilla  has presented today for cardiac with the diagnosis of adnormal stress test, chest pain.  The various methods of treatment have been discussed with the patient and family. After consideration of risks, benefits and other options for treatment, the patient has consented to  Procedure(s) (LRB) with comments: LEFT HEART CATHETERIZATION WITH CORONARY ANGIOGRAM (N/A) as a surgical intervention .  The patient's history has been reviewed, patient examined, no change in status, stable for surgery.  I have reviewed the patient's chart and labs.  Questions were answered to the patient's satisfaction.     [name]

## 2012-03-10 NOTE — H&P (View-Only) (Signed)
History of Present Illness: [name] with history of CAD, PAD, HTN, OSA who is here today for cardiac follow up. He has been followed in the past by Dr. Dietrich Pates but wishes to move his care to our office here in [place]. His coronary history includes MI in 1999 with stent placement ? Vessel following tPA and then balloon angioplasty of the PDA and second marginal in March of 2000. He did have a stress test in November 2011 which was normal. Admitted December 2012 to [facility] service with an ischemic right leg. He had undergone prior aortic Zenith aneurysm stent graft repair in 2007. Was seen urgently by Dr. Myra Gianotti in December 2012 and was found to have occluded right limb of stent graft as well as thrombosed popliteal aneurysm right leg. He underwent femoral-femoral bypass grafting and a femoral to below knee popliteal artery bypass graft with Gore-Tex. Readmitted March 2013 and underwent ligation of left popliteal aneurysm with left above-knee to below-knee popliteal bypass with reversed greater saphenous vein by Dr. Darrick Penna. I saw him 02/13/12 and he had c/o pinching type chest pains for a few months, occurring several times per week as well as worsening dyspnea. I arranged a Lexiscan stress myoview on 02/18/12 which showed a moderate sized scar of moderate severity involving the mid-inferior, basal inferior, mid-inferolateral and basal inferolateral segments. There was minimal reversibility. LVEF=38%.   He is here today for follow up. His symptoms have not changed. He is still having chest pains and dyspnea. Overall energy level is down.   Primary Care Physician: Juanetta Gosling   Past Medical History  Diagnosis Date  . Arteriosclerotic cardiovascular disease (ASCVD) 2000 he    MI in 1999; PTCA of PDA and 2nd marginal in 3/00  . AAA (abdominal aortic aneurysm) 2007    stent graft repair 12/07  . Laryngeal carcinoma 2010    resection and radiation therapy 2010-11  . Diverticulitis 2006  . Obesity    BMI 47; lap band surgery 2010  . Gastroesophageal reflux disease     Hiatal hernia; previously treated with PPI  . Tobacco abuse, in remission     40 pack years; discontinued in 2000  . Cerebrovascular disease   . Cholelithiasis 11/28/2010  . Elevated PSA 06/2011  . Sleep apnea     BiPAP mask utilized        2 yrs sleep study    dr Andrey Campanile  . Hypertension     Mild; controlled with a single agent       dr Dietrich Pates      cardiac md  . Cough, persistent   . 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 Peripheral arterial stent graft 2007    for abdominal aortic aneurysm  . Larynx surgery 2010    Resection of carcinoma  . Open anterior shoulder reconstruction 2011    Left  . Laparoscopic gastric banding 2010  . Eye surgery   . Cataract extraction   . Colonoscopy 10/2007  . Femoral-femoral bypass graft 04/18/2011    Procedure: BYPASS GRAFT FEMORAL-FEMORAL ARTERY;  Surgeon: Juleen China, MD;  Location: [facility] OR;  Service: Vascular;  Laterality: Bilateral;  Left to right femoral to femoral bypass   . Femoral-popliteal bypass graft 04/18/2011    Procedure: BYPASS GRAFT FEMORAL-POPLITEAL ARTERY;  Surgeon: Juleen China, MD;  Location: [facility] OR;  Service: Vascular;  Laterality: Right;  Right femoral popliteal bypass with propaten gortex graft  .  Cardiac catheterization 2000    PCI-stent  . Pr vein bypass graft,aorto-fem-pop 07-23-11    Left AK to BK popliteal BPG using rev. saphenous vein    Current Outpatient Prescriptions  Medication Sig Dispense Refill  . aspirin EC 81 MG tablet Take 81 mg by mouth every other day.       . CYMBALTA 30 MG capsule Daily.      Marland Kitchen lisinopril (PRINIVIL,ZESTRIL) 40 MG tablet Take 40 mg by mouth daily.        Marland Kitchen omeprazole (PRILOSEC) 20 MG capsule Take 20 mg by mouth daily.       . potassium chloride (K-DUR,KLOR-CON) 10 MEQ tablet Take by mouth 2 (two) times daily.       Marland Kitchen senna (SENOKOT) 8.6 MG TABS Take 2 tablets  by mouth daily as needed. For constipation      . Tamsulosin HCl (FLOMAX) 0.4 MG CAPS Take 0.4 mg by mouth daily after supper.        . testosterone cypionate (DEPOTESTOTERONE CYPIONATE) 200 MG/ML injection 1 INJECTION EVERY TWO WEEKS      . triazolam (HALCION) 0.25 MG tablet Take 0.25 mg by mouth at bedtime as needed. For sleep        Allergies  Allergen Reactions  . Aspirin     Daily dose upsets stomach  . Statins Hives and Rash    History   Social History  . Marital Status: Married    Spouse Name: N/A    Number of Children: 3  . Years of Education: N/A   Occupational History  . Retired, part Regulatory affairs officer    Social History Main Topics  . Smoking status: Former Smoker -- 2.0 packs/day for 30 years    Types: Cigarettes    Quit date: 11/26/1997  . Smokeless tobacco: Never Used     Comment: 40 pack years; quit 2000  . Alcohol Use: No  . Drug Use: No  . Sexually Active: Yes   Other Topics Concern  . Not on file   Social History Narrative   Married, past employed, does not get regular exercise.     Family History  Problem Relation Age of Onset  . Heart attack Father     deceased  . Heart failure Father   . Hyperlipidemia Father   . Hypertension Father   . Heart disease Father   . Lung cancer Mother     deceased   . Hypertension Mother   . Hyperlipidemia Mother   . Heart failure Mother   . Diabetes Mother   . Cancer Sister     vaginal  . Diabetes Sister     Review of Systems:  As stated in the HPI and otherwise negative.   BP 125/70  Pulse 84  Wt 327 lb (148.326 kg)  Physical Examination: General: Well developed, well nourished, NAD HEENT: OP clear, mucus membranes moist SKIN: warm, dry. No rashes. Neuro: No focal deficits Musculoskeletal: Muscle strength 5/5 all ext Psychiatric: Mood and affect normal Neck: No JVD, no carotid bruits, no thyromegaly, no lymphadenopathy. Lungs:Clear bilaterally, no wheezes, rhonci, crackles Cardiovascular: Regular  rate and rhythm. No murmurs, gallops or rubs. Abdomen:Soft. Bowel sounds present. Non-tender.  Extremities: No lower extremity edema.   EKG: NSR, rate 86 bpm. Non-specific ST changes inferior leads with small Q waves.   Lexiscan Stress Myoview: 02/18/12:  Stress Procedure: The patient received IV Lexiscan 0.4 mg over 15-seconds. Technetium 75m Sestamibi injected at 30-seconds. There were no significant changes, rare pvcs,  and no symptoms with Lexiscan. Quantitative spect images were obtained after a 45 minute delay.  Stress ECG: No significant change from baseline ECG  QPS  Raw Data Images: Normal; no motion artifact; normal heart/lung ratio.  Stress Images: There is decreased uptake in the inferior wall.  Rest Images: There is decreased uptake in the inferior wall.  Subtraction (SDS): There is a fixed defect that is most consistent with a previous infarction.  Transient Ischemic Dilatation (Normal <1.22): 1.07  Lung/Heart Ratio (Normal <0.45): 0.48  Quantitative Gated Spect Images  QGS EDV: 192 ml  QGS ESV: 119 ml  Impression  Exercise Capacity: Lexiscan with no exercise.  BP Response: Normal blood pressure response.  Clinical Symptoms: No significant symptoms noted.  ECG Impression: No significant ST segment change suggestive of ischemia.  Comparison with Prior Nuclear Study: No images to compare  Overall Impression: Abnormal stress nuclear study. There is a moderate sized scar of moderate severity involving the mid-inferior, basal inferior, mid-inferolateral and basal inferolateral segments. There is minimal reversibility.  LV Ejection Fraction: 38%. LV Wall Motion: Mild inferior wall hypokinesis,.   Assessment and Plan:   1. CAD: He is known to have CAD and has had prior angioplasty. No cath in 13 years. Stress test is worrisome for inferior wall scar with possible ischemia. LVEF is down which is new. Will plan cath next week on 03/10/12 in the main cath lab. Right radial artery  approach. Risks and benefits reviewed with pt and wife including bleeding/infection/CVA/MI/contrast reaction, ARF and death. Pre-cath labs today. Recent CXR ok.   2. Carotid artery bruit: Will check carotid artery dopplers. High risk for carotid artery disease with known severe PAD/CAD.  3. PAD: Followed in [facility]

## 2012-03-10 NOTE — CV Procedure (Signed)
    Cardiac Catheterization Operative Report  Mathew Padilla [id number] 11/13/201310:19 AM Fredirick Maudlin, MD  Procedure Performed:  1. Left Heart Catheterization 2. Selective Coronary Angiography 3. Left ventricular pressures  Operator: Verne Carrow, MD  Arterial access site:  Right radial artery.   Indication:   [name] with history of CAD, PAD, HTN, OSA who has prior history of inferior MI in 1999 and subsequent balloon angioplasty in 2000 of the RCA. He has had recent sharp, sticking chest pains and dyspnea. Stress myoview with inferior wall scar with minimal reversibility. LVEF=38%. Cardiac cath to exclude progression of CAD.                               Procedure Details: The risks, benefits, complications, treatment options, and expected outcomes were discussed with the patient. The patient and/or family concurred with the proposed plan, giving informed consent. The patient was brought to the cath lab after IV hydration was begun and oral premedication was given. The patient was further sedated with Versed and Fentanyl The right wrist was assessed with an Allens test which was positive. The right wrist was prepped and draped in a sterile fashion. 1% lidocaine was used for local anesthesia. Using the modified Seldinger access technique, a 5 French sheath was placed in the right radial artery. 3 mg Verapamil was given through the sheath. 5500 units IV heparin was given. A JR4 catheter was used to engage the RCA. A JL-4 catheter was used to engage the left main artery.  A pigtail catheter was used to measure LV pressures. The sheath was removed from the right radial artery and a Terumo hemostasis band was applied at the arteriotomy site on the right wrist.    There were no immediate complications. The patient was taken to the recovery area in stable condition.   Hemodynamic Findings: Central aortic pressure: 150/89 Left ventricular pressure: 149/923  Angiographic  Findings:  Left main: No obstructive disease.   Left Anterior Descending Artery:  Large caliber vessel that courses to the apex. The mid vessel has a 50% stenosis which was visualized in multiple views and does not appear to be flow limiting. The first diagonal branch arises from this area of stenosis. This small to moderate sized branch has mild plaque disease. The mid and distal LAD has mild plaque disease.   Circumflex Artery: Large caliber vessel with moderate sized bifurcating first obtuse marginal branch with mild plaque disease. 50% stenosis mid AV groove Circumflex, does not appear flow limiting. The most distal obtuse marginal is small in caliber and has a 70% stenosis. This vessel is too small for PCI.   Right Coronary Artery: This appears to be at least a moderate sized vessel. The proximal vessel has 99% sub-total occlusion followed by a 100% occlusion in the mid vessel.   Left Ventricular Angiogram: Deferred.   Impression: 1. Triple vessel CAD with moderate disease in the LAD and Circumflex, chronic total occlusion of the RCA 2. Inferior wall scar on myoview c/w the chronic total occlusion of the RCA  Recommendations: Continue medical management of CAD.        Complications:  None. The patient tolerated the procedure well.

## 2012-03-19 ENCOUNTER — Encounter (INDEPENDENT_AMBULATORY_CARE_PROVIDER_SITE_OTHER): Payer: Medicare Other

## 2012-03-19 DIAGNOSIS — I6529 Occlusion and stenosis of unspecified carotid artery: Secondary | ICD-10-CM

## 2012-03-19 DIAGNOSIS — R0989 Other specified symptoms and signs involving the circulatory and respiratory systems: Secondary | ICD-10-CM

## 2012-03-31 ENCOUNTER — Encounter: Payer: Self-pay | Admitting: Neurosurgery

## 2012-04-01 ENCOUNTER — Ambulatory Visit (INDEPENDENT_AMBULATORY_CARE_PROVIDER_SITE_OTHER): Payer: Medicare Other | Admitting: Neurosurgery

## 2012-04-01 ENCOUNTER — Encounter: Payer: Self-pay | Admitting: Neurosurgery

## 2012-04-01 ENCOUNTER — Encounter (INDEPENDENT_AMBULATORY_CARE_PROVIDER_SITE_OTHER): Payer: Medicare Other | Admitting: *Deleted

## 2012-04-01 ENCOUNTER — Ambulatory Visit (INDEPENDENT_AMBULATORY_CARE_PROVIDER_SITE_OTHER): Payer: Medicare Other

## 2012-04-01 VITALS — BP 165/100 | HR 96 | Resp 18 | Ht 71.0 in | Wt 329.0 lb

## 2012-04-01 VITALS — BP 137/74 | HR 98 | Wt 329.8 lb

## 2012-04-01 DIAGNOSIS — I739 Peripheral vascular disease, unspecified: Secondary | ICD-10-CM

## 2012-04-01 DIAGNOSIS — Z48812 Encounter for surgical aftercare following surgery on the circulatory system: Secondary | ICD-10-CM

## 2012-04-01 DIAGNOSIS — R209 Unspecified disturbances of skin sensation: Secondary | ICD-10-CM

## 2012-04-01 DIAGNOSIS — I714 Abdominal aortic aneurysm, without rupture: Secondary | ICD-10-CM

## 2012-04-01 DIAGNOSIS — R238 Other skin changes: Secondary | ICD-10-CM

## 2012-04-01 DIAGNOSIS — I70219 Atherosclerosis of native arteries of extremities with intermittent claudication, unspecified extremity: Secondary | ICD-10-CM

## 2012-04-01 DIAGNOSIS — R079 Chest pain, unspecified: Secondary | ICD-10-CM

## 2012-04-01 MED ORDER — NITROGLYCERIN 0.4 MG SL SUBL: 0.4000 mg | SUBLINGUAL_TABLET | SUBLINGUAL | Status: DC | PRN

## 2012-04-01 NOTE — Progress Notes (Signed)
Mathew Padilla walked in complaining of an episode of back and chest pain this am.  He states he was at another physician's office this morning and states that physician told him to either go to the ER or come in to our clinic.  He states he experienced a sharp pain his his back underneath his right shoulder blade which shot straight through to his chest.  He describes that pain as sharp and was a 10/10 on the pain scale.  The initial sharp severe pain lasted about 2-3 minutes after which it eased off and was gone in about 20 minutes.  He states the pain was so severe it "made me bend over".  The pain started while he was putting his coat on.  He states he had sob while he had the pain but no sob now.  He states he was unable to take a deep breath for about 15 minutes after the pain started.  He states his bp was elevated earlier this am at the other physician's office (165/100).  He states he does not have any nitro and pain was relieved by rest.   EKG was done and shown to Dr Clifton James.  Nitro was ordered by Dr Clifton James.  He was told to go to the ER if he experiences this pain again per Dr Clifton James.

## 2012-04-01 NOTE — Progress Notes (Signed)
[facility] Office Note  CC: PVD surveillance Referring Physician: Fields  History of Present Illness:  61 year old male patient of Dr. Darrick Penna status post a AAA stent graft 2007, left to right femoropopliteal bypass in 2012, right femoropopliteal also in 2012 and a left above-the-knee to below the knee popliteal graft in March 2013. The patient denies claudication or rest pain. The patient does state he had a "stabbing" right-sided chest pain this morning that lasted approximately 20 minutes and affected his breathing as well as caused him to have to sit instead of stand. The patient's wife stated she tried to take him to the ED but he insisted on coming to this appointment. I did reiterate to the patient when he leaves here today he needs to go to his cardiologist to be evaluated and explained to them what kind of episode he had this morning.    Past Medical History  Diagnosis Date  . Arteriosclerotic cardiovascular disease (ASCVD) 2000 he    MI in 1999; PTCA of PDA and 2nd marginal in 3/00  . AAA (abdominal aortic aneurysm) 2007    stent graft repair 12/07  . Laryngeal carcinoma 2010    resection and radiation therapy 2010-11  . Diverticulitis 2006  . Obesity     BMI 47; lap band surgery 2010  . Gastroesophageal reflux disease     Hiatal hernia; previously treated with PPI  . Tobacco abuse, in remission     40 pack years; discontinued in 2000  . Cerebrovascular disease   . Cholelithiasis 11/28/2010  . Elevated PSA 06/2011  . Sleep apnea     BiPAP mask utilized        2 yrs sleep study    dr Andrey Campanile  . Hypertension     Mild; controlled with a single agent       dr Dietrich Pates      cardiac md  . Cough, persistent   . Popliteal aneurysm     popliteal bypass-2013    ROS: [x]  Positive   [ ]  Denies    General: [ ]  Weight loss, [ ]  Fever, [ ]  chills Neurologic: [ ]  Dizziness, [ ]  Blackouts, [ ]  Seizure [ ]  Stroke, [ ]  "Mini stroke", [ ]  Slurred  speech, [ ]  Temporary blindness; [ ]  weakness in arms or legs, [ ]  Hoarseness Cardiac: [ ]  Chest pain/pressure, [ ]  Shortness of breath at rest [ ]  Shortness of breath with exertion, [ ]  Atrial fibrillation or irregular heartbeat Vascular: [ ]  Pain in legs with walking, [ ]  Pain in legs at rest, [ ]  Pain in legs at night,  [ ]  Non-healing ulcer, [ ]  Blood clot in vein/DVT,   Pulmonary: [ ]  Home oxygen, [ ]  Productive cough, [ ]  Coughing up blood, [ ]  Asthma,  [ ]  Wheezing Musculoskeletal:  [ ]  Arthritis, [ ]  Low back pain, [ ]  Joint pain Hematologic: [ ]  Easy Bruising, [ ]  Anemia; [ ]  Hepatitis Gastrointestinal: [ ]  Blood in stool, [ ]  Gastroesophageal Reflux/heartburn, [ ]  Trouble swallowing Urinary: [ ]  chronic Kidney disease, [ ]  on HD - [ ]  MWF or [ ]  TTHS, [ ]  Burning with urination, [ ]  Difficulty urinating Skin: [ ]  Rashes, [ ]  Wounds Psychological: [ ]  Anxiety, [ ]  Depression   Social History History  Substance Use Topics  . Smoking status: Former Smoker -- 2.0 packs/day for 30 years    Types: Cigarettes    Quit date: 11/26/1997  .  Smokeless tobacco: Never Used     Comment: 40 pack years; quit 2000  . Alcohol Use: No    Family History Family History  Problem Relation Age of Onset  . Heart attack Father     deceased  . Heart failure Father   . Hyperlipidemia Father   . Hypertension Father   . Heart disease Father   . Lung cancer Mother     deceased   . Hypertension Mother   . Hyperlipidemia Mother   . Heart failure Mother   . Diabetes Mother   . Cancer Sister     vaginal  . Diabetes Sister     Allergies  Allergen Reactions  . Aspirin     Daily dose upsets stomach  . Statins Hives and Rash    Current Outpatient Prescriptions  Medication Sig Dispense Refill  . aspirin EC 81 MG tablet Take 81 mg by mouth every other day.       . CYMBALTA 30 MG capsule Take 30 mg by mouth Daily.       Marland Kitchen lisinopril (PRINIVIL,ZESTRIL) 40 MG tablet Take 40 mg by mouth daily.         Marland Kitchen omeprazole (PRILOSEC) 20 MG capsule Take 20 mg by mouth daily.       . potassium chloride (K-DUR,KLOR-CON) 10 MEQ tablet Take by mouth 2 (two) times daily.       Marland Kitchen senna (SENOKOT) 8.6 MG TABS Take 2 tablets by mouth daily as needed. For constipation      . Tamsulosin HCl (FLOMAX) 0.4 MG CAPS Take 0.4 mg by mouth daily after supper.        . testosterone cypionate (DEPOTESTOTERONE CYPIONATE) 200 MG/ML injection Inject 200 mg into the muscle every 14 (fourteen) days. 1 INJECTION EVERY TWO WEEKS      . triazolam (HALCION) 0.25 MG tablet Take 0.25 mg by mouth at bedtime as needed. For sleep        Physical Examination  Filed Vitals:   04/01/12 1129  BP: 165/100  Pulse: 96  Resp: 18    Body mass index is 45.89 kg/(m^2).  General:  WDWN in NAD Gait: Normal HEENT: WNL Eyes: Pupils equal Pulmonary: normal non-labored breathing , without Rales, rhonchi,  wheezing Cardiac: RRR, without  Murmurs, rubs or gallops; No carotid bruits Abdomen: soft, NT, no masses Skin: no rashes, ulcers noted Vascular Exam/Pulses: Palpable femoral pulses bilaterally, lower extremity pulses are heard with Doppler, no abdominal mass palpated due to girth  Extremities without ischemic changes, no Gangrene , no cellulitis; no open wounds;  Musculoskeletal: no muscle wasting or atrophy  Neurologic: A&O X 3; Appropriate Affect ; SENSATION: normal; MOTOR FUNCTION:  moving all extremities equally. Speech is fluent/normal  Non-Invasive Vascular Imaging: ABIs today are 0.98 and triphasic on the right, 0.97 and triphasic on the left which is consistent with previous exams, he also has patent bilateral lower extremity bypass grafts.  ASSESSMENT/PLAN: Asymptomatic vascular patient that is being referred to Dr. Gibson Ramp office for evaluation of his right sided chest episode this morning. The patient and his wife both state they will make that appointment today. The patient will followup here in 4-5 months with repeat  bypass graft duplex is and ABIs to get him back on surveillance schedule. The patient's questions were encouraged and answered, he is in agreement with this plan.  Lauree Chandler ANP  Clinic M.D.: [facility]

## 2012-04-08 ENCOUNTER — Encounter (INDEPENDENT_AMBULATORY_CARE_PROVIDER_SITE_OTHER): Payer: Medicare Other

## 2012-04-29 ENCOUNTER — Ambulatory Visit: Payer: Medicare Other | Admitting: Cardiovascular Disease

## 2012-05-12 ENCOUNTER — Ambulatory Visit (INDEPENDENT_AMBULATORY_CARE_PROVIDER_SITE_OTHER): Payer: Medicare Other | Admitting: Surgery

## 2012-05-12 ENCOUNTER — Encounter (INDEPENDENT_AMBULATORY_CARE_PROVIDER_SITE_OTHER): Payer: Self-pay | Admitting: Surgery

## 2012-05-12 VITALS — BP 136/82 | HR 81 | Temp 97.6°F | Resp 18 | Ht 71.0 in | Wt 331.4 lb

## 2012-05-12 DIAGNOSIS — Z9884 Bariatric surgery status: Secondary | ICD-10-CM

## 2012-05-12 NOTE — Progress Notes (Signed)
[facility]  Ovidio Kin, MD,  FACS [address]  [address] [place], [place]    [address] Phone:  [phone] FAX:  [phone]   Re:   Mathew Padilla DOB:   1950-12-13 MRN:   [id number]  ASSESSMENT AND PLAN: 1.  History of lap band - [facility] - Dr. Ezzard Standing - 12/19/2008  [Note his daughter has a lap band (placed by Martin?)]  Lap band too tight.  I removed 2 cc, leaving about 4 cc in the lap band.  If he does well with this, I will see him back in 3 months.  If not, I will see him earlier.  2.  Left shoulder repair in 2011. 3.  Throat cancer, disease free.  S/P resection and radiation tx  Treatment by Dr. Andrey Campanile in [place]. 4.  Hypertensiin 5.  CAD with stents  Dr. York Pellant to card offic 04/01/2012 with chest pain - EKG reviewed by Dr. Clifton James.  Left heart cath - 03/10/2012 - Dr. Alyson Ingles  Inferior wall scar on myoview c/w the chronic total occlusion of the RCA 6.  Sleep apnea on CPAP - 7.  GERD 8.  Hypertriglyceridemia 9.  Lower extremity neuropathy 10.  AAA stent graft 2007, left to right femoropopliteal bypass  In 2012, right femoropopliteal also in 2012 and a left above-the-knee to below the knee popliteal graft in March 2013. 11. On disability.  HISTORY OF PRESENT ILLNESS: Chief Complaint  Patient presents with  . Lap Band Fill   Mathew Padilla is a 62 y.o. (DOB: 1950-09-02)  white  male who is a patient of HAWKINS,EDWARD L, MD and comes to me today for follow up of lap band.  He is accompanied with his wife.  He last saw Mardelle Matte on 01/01/2012.  He has over the last 3 months not been able to eat vegetables, salads, or any meet with substance. Sometimes the medicine he takes comes back. He spits out mucous 2-3 times per week. He is fairly descriptive of the spitting up episodes.0 He is also troubled constipation where he will go to or 3 days without a bowel movement. When this happens he does not feel good.  He is not getting much exercise. He  has access to a pool he goes to about once a month.  He cannot walk a mile.  He says he gets SOB easily, but sounds like he does better int he pool  The ask about converting to a sleeve gastrectomy, but I raised several questions about this.  PHYSICAL EXAM: BP 136/82  Pulse 81  Temp 97.6 F (36.4 C) (Temporal)  Resp 18  Ht 5\' 11"  (1.803 m)  Wt 331 lb 6.4 oz (150.322 kg)  BMI 46.22 kg/m2  General: Obese WM who is alert and generally healthy appearing.  HEENT: Normal. Pupils equal. Good dentition. Abdomen: Soft. Port RUQ.  No other abdominal mass.  Extremities:  Good strength and ROM  in upper and lower extremities.  Procedure:  While in office, I acces his port.  The fluid was under a little pressure (about a 0.5 cc push back).  I removed about 2 cc.  I think there are 4 cc left.  DATA REVIEWED: Old chart and Epic.   Ovidio Kin, MD, FACS Office:  [phone]

## 2012-05-17 ENCOUNTER — Other Ambulatory Visit: Payer: Self-pay | Admitting: *Deleted

## 2012-05-17 DIAGNOSIS — I739 Peripheral vascular disease, unspecified: Secondary | ICD-10-CM

## 2012-05-17 DIAGNOSIS — I714 Abdominal aortic aneurysm, without rupture: Secondary | ICD-10-CM

## 2012-05-17 DIAGNOSIS — Z48812 Encounter for surgical aftercare following surgery on the circulatory system: Secondary | ICD-10-CM

## 2012-06-01 ENCOUNTER — Ambulatory Visit: Payer: Medicare Other | Admitting: Cardiovascular Disease

## 2012-08-05 ENCOUNTER — Ambulatory Visit: Payer: Medicare Other | Admitting: Neurosurgery

## 2012-08-05 ENCOUNTER — Encounter (INDEPENDENT_AMBULATORY_CARE_PROVIDER_SITE_OTHER): Payer: Medicare Other | Admitting: *Deleted

## 2012-08-05 DIAGNOSIS — I739 Peripheral vascular disease, unspecified: Secondary | ICD-10-CM

## 2012-08-05 DIAGNOSIS — Z48812 Encounter for surgical aftercare following surgery on the circulatory system: Secondary | ICD-10-CM

## 2012-08-05 DIAGNOSIS — I714 Abdominal aortic aneurysm, without rupture: Secondary | ICD-10-CM

## 2012-08-06 ENCOUNTER — Other Ambulatory Visit: Payer: Self-pay | Admitting: *Deleted

## 2012-08-06 DIAGNOSIS — I739 Peripheral vascular disease, unspecified: Secondary | ICD-10-CM

## 2012-08-06 DIAGNOSIS — Z48812 Encounter for surgical aftercare following surgery on the circulatory system: Secondary | ICD-10-CM

## 2012-08-06 DIAGNOSIS — I714 Abdominal aortic aneurysm, without rupture: Secondary | ICD-10-CM

## 2012-08-09 ENCOUNTER — Encounter: Payer: Self-pay | Admitting: Vascular Surgery

## 2012-09-02 ENCOUNTER — Ambulatory Visit: Payer: Medicare Other | Admitting: Cardiovascular Disease

## 2012-10-25 ENCOUNTER — Ambulatory Visit (INDEPENDENT_AMBULATORY_CARE_PROVIDER_SITE_OTHER): Payer: Medicare Other | Admitting: Cardiovascular Disease

## 2012-10-25 ENCOUNTER — Encounter: Payer: Self-pay | Admitting: Cardiovascular Disease

## 2012-10-25 VITALS — BP 142/86 | HR 87 | Ht 71.0 in | Wt 343.0 lb

## 2012-10-25 DIAGNOSIS — I2589 Other forms of chronic ischemic heart disease: Secondary | ICD-10-CM

## 2012-10-25 DIAGNOSIS — I255 Ischemic cardiomyopathy: Secondary | ICD-10-CM

## 2012-10-25 DIAGNOSIS — I2510 Atherosclerotic heart disease of native coronary artery without angina pectoris: Secondary | ICD-10-CM

## 2012-10-25 MED ORDER — POTASSIUM CHLORIDE CRYS ER 20 MEQ PO TBCR: 20.0000 meq | EXTENDED_RELEASE_TABLET | Freq: Every day | ORAL | Status: DC

## 2012-10-25 MED ORDER — FUROSEMIDE 40 MG PO TABS: 40.0000 mg | ORAL_TABLET | Freq: Every day | ORAL | Status: DC

## 2012-10-25 MED ORDER — CARVEDILOL 3.125 MG PO TABS: 3.1250 mg | ORAL_TABLET | Freq: Two times a day (BID) | ORAL | Status: DC

## 2012-10-25 NOTE — Progress Notes (Signed)
History of Present Illness: 62 yo WM with history of CAD, PAD, HTN, OSA who is here today for cardiac follow up. He has been followed in the past by Dr. Dietrich Pates but moved his care to the [facility] office November 2013. His coronary history includes MI in 1999 with stent placement ? Vessel following tPA and then balloon angioplasty of the PDA and second marginal in March of 2000. He did have a stress test in November 2011 which was normal. Admitted December 2012 to [facility] service with an ischemic right leg. He had undergone prior aortic Zenith aneurysm stent graft repair in 2007. Was seen urgently by Dr. Myra Gianotti in December 2012 and was found to have occluded right limb of stent graft as well as thrombosed popliteal aneurysm right leg. He underwent femoral-femoral bypass grafting and a femoral to below knee popliteal artery bypass graft with Gore-Tex. Readmitted March 2013 and underwent ligation of left popliteal aneurysm with left above-knee to below-knee popliteal bypass with reversed greater saphenous vein by Dr. Darrick Penna. I saw him 02/13/12 and he had c/o pinching type chest pains for a few months, occurring several times per week as well as worsening dyspnea. I arranged a Lexiscan stress myoview on 02/18/12 which showed a moderate sized scar of moderate severity involving the mid-inferior, basal inferior, mid-inferolateral and basal inferolateral segments. There was minimal reversibility. LVEF=38%. Cardiac cath 03/11/12 with moderate stenosis mid LAD and mid Circumflex with severe stenosis small OM branch (too small for PCI) and totally occluded mid RCA. We continued medical management. Carotid artery dopplers Novemer 2013 with mild bilateral disease.   He is here today for follow up. He has no chest pain. Some SOB but unchanged.    Primary Care Physician:  Kari Baars   Last Lipid Profile: Followed in primary care   Past Medical History  Diagnosis Date  . Arteriosclerotic cardiovascular disease  (ASCVD) 2000 he    MI in 1999; PTCA of PDA and 2nd marginal in 3/00  . AAA (abdominal aortic aneurysm) 2007    stent graft repair 12/07  . Laryngeal carcinoma 2010    resection and radiation therapy 2010-11  . Diverticulitis 2006  . Obesity     BMI 47; lap band surgery 2010  . Gastroesophageal reflux disease     Hiatal hernia; previously treated with PPI  . Tobacco abuse, in remission     40 pack years; discontinued in 2000  . Cerebrovascular disease   . Cholelithiasis 11/28/2010  . Elevated PSA 06/2011  . Sleep apnea     BiPAP mask utilized        2 yrs sleep study    dr Andrey Campanile  . Hypertension     Mild; controlled with a single agent       dr Dietrich Pates      cardiac md  . Cough, persistent   . 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 Peripheral arterial stent graft  2007    for abdominal aortic aneurysm  . Larynx surgery  2010    Resection of carcinoma  . Open anterior shoulder reconstruction  2011    Left  . Laparoscopic gastric banding  2010  . Eye surgery    . Cataract extraction    . Colonoscopy  10/2007  . Femoral-femoral bypass graft  04/18/2011    Procedure: BYPASS GRAFT FEMORAL-FEMORAL ARTERY;  Surgeon: Juleen China, MD;  Location: [facility] OR;  Service: Vascular;  Laterality: Bilateral;  Left to right femoral to femoral bypass   . Femoral-popliteal bypass graft  04/18/2011    Procedure: BYPASS GRAFT FEMORAL-POPLITEAL ARTERY;  Surgeon: Juleen China, MD;  Location: [facility] OR;  Service: Vascular;  Laterality: Right;  Right femoral popliteal bypass with propaten gortex graft  . Cardiac catheterization  2000    PCI-stent  . Pr vein bypass graft,aorto-fem-pop  07-23-11    Left AK to BK popliteal BPG using rev. saphenous vein    Current Outpatient Prescriptions  Medication Sig Dispense Refill  . cefUROXime (CEFTIN) 500 MG tablet       . cephALEXin (KEFLEX) 500 MG capsule       . Cyanocobalamin (VITAMIN B-12 PO)  Take by mouth.      Marland Kitchen L-Methylfolate-B6-B12 (METANX PO) Take by mouth 2 (two) times daily.      Marland Kitchen lisinopril (PRINIVIL,ZESTRIL) 40 MG tablet Take 40 mg by mouth daily.        . nitroGLYCERIN (NITROSTAT) 0.4 MG SL tablet Place 1 tablet (0.4 mg total) under the tongue every 5 (five) minutes as needed for chest pain.  25 tablet  11  . omeprazole (PRILOSEC) 20 MG capsule Take 20 mg by mouth daily.       . potassium chloride (K-DUR,KLOR-CON) 10 MEQ tablet Take by mouth 2 (two) times daily.       . predniSONE (DELTASONE) 10 MG tablet       . Pyridoxine HCl (VITAMIN B6 PO) Take by mouth.      . senna (SENOKOT) 8.6 MG TABS Take 2 tablets by mouth daily as needed. For constipation      . Tamsulosin HCl (FLOMAX) 0.4 MG CAPS Take 0.4 mg by mouth daily after supper.        . Thiamine Mononitrate (VITAMIN B1 PO) Take by mouth.      . triazolam (HALCION) 0.25 MG tablet Take 0.25 mg by mouth at bedtime as needed. For sleep       No current facility-administered medications for this visit.    Allergies  Allergen Reactions  . Aspirin     Daily dose upsets stomach  . Statins Hives and Rash    History   Social History  . Marital Status: Married    Spouse Name: N/A    Number of Children: 3  . Years of Education: N/A   Occupational History  . Retired, part Regulatory affairs officer    Social History Main Topics  . Smoking status: Former Smoker -- 2.00 packs/day for 30 years    Types: Cigarettes    Quit date: 11/26/1997  . Smokeless tobacco: Never Used     Comment: 40 pack years; quit 2000  . Alcohol Use: No  . Drug Use: No  . Sexually Active: Yes   Other Topics Concern  . Not on file   Social History Narrative   Married, past employed, does not get regular exercise.     Family History  Problem Relation Age of Onset  . Heart attack Father     deceased  . Heart failure Father   . Hyperlipidemia Father   . Hypertension Father   . Heart disease Father   . Lung cancer Mother     deceased   .  Hypertension Mother   . Hyperlipidemia Mother   . Heart failure Mother   . Diabetes Mother   . Cancer Sister     vaginal  . Diabetes Sister     Review of Systems:  As  stated in the HPI and otherwise negative.   BP 142/86  Pulse 87  Ht 5\' 11"  (1.803 m)  Wt 343 lb (155.584 kg)  BMI 47.86 kg/m2  SpO2 96%  Physical Examination: General: Well developed, well nourished, NAD HEENT: OP clear, mucus membranes moist SKIN: warm, dry. No rashes. Neuro: No focal deficits Musculoskeletal: Muscle strength 5/5 all ext Psychiatric: Mood and affect normal Neck: No JVD, no carotid bruits, no thyromegaly, no lymphadenopathy. Lungs:Clear bilaterally, no wheezes, rhonci, crackles Cardiovascular: Regular rate and rhythm. No murmurs, gallops or rubs. Abdomen:Soft. Bowel sounds present. Non-tender.  Extremities: No lower extremity edema. Pulses are 2 + in the bilateral DP/PT.  EKG:  Cardiac cath 03/11/12: Left main: No obstructive disease.  Left Anterior Descending Artery: Large caliber vessel that courses to the apex. The mid vessel has a 50% stenosis which was visualized in multiple views and does not appear to be flow limiting. The first diagonal branch arises from this area of stenosis. This small to moderate sized branch has mild plaque disease. The mid and distal LAD has mild plaque disease.  Circumflex Artery: Large caliber vessel with moderate sized bifurcating first obtuse marginal branch with mild plaque disease. 50% stenosis mid AV groove Circumflex, does not appear flow limiting. The most distal obtuse marginal is small in caliber and has a 70% stenosis. This vessel is too small for PCI.  Right Coronary Artery: This appears to be at least a moderate sized vessel. The proximal vessel has 99% sub-total occlusion followed by a 100% occlusion in the mid vessel.  Left Ventricular Angiogram: Deferred.  Impression:  1. Triple vessel CAD with moderate disease in the LAD and Circumflex, chronic  total occlusion of the RCA  2. Inferior wall scar on myoview c/w the chronic total occlusion of the RCA   Assessment and Plan:   1. CAD: Stable. He is known to have moderate mid LAD and moderate mid Circumflex disease with chronic occlusion of mid RCA by cath November 2013.  He is intolerant to ASA and statins. He can tolerate  2. Carotid artery bruit: Mild bilateral disease by caroitd dopplers November 2013.    3. PAD: Followed in [facility]  4. Ischemic Cardiomyopathy: He is on an Ace-inh. Will start Coreg 3.125 mg po BID. Will start Lasix 40 mg po Qdaily.

## 2012-10-25 NOTE — Patient Instructions (Addendum)
Your physician wants you to follow-up in:  6 months. You will receive a reminder letter in the mail two months in advance. If you don't receive a letter, please call our office to schedule the follow-up appointment.  Your physician recommends that you return for fasting lab work in:  1 week.  BMP and Lipid profile.  The lab opens at 7:30 AM every week day.  Your physician has recommended you make the following change in your medication:  Start furosemide 40 mg by mouth daily. Change potassium to 20 meq by mouth once daily.  Start Coreg 3.125 mg by mouth twice daily

## 2012-11-01 ENCOUNTER — Other Ambulatory Visit (INDEPENDENT_AMBULATORY_CARE_PROVIDER_SITE_OTHER): Payer: Medicare Other

## 2012-11-01 DIAGNOSIS — I2510 Atherosclerotic heart disease of native coronary artery without angina pectoris: Secondary | ICD-10-CM

## 2012-11-01 LAB — BASIC METABOLIC PANEL
GFR: 94.19 mL/min (ref >60.00)
Potassium: 4 mEq/L (ref 3.5–5.1)
Sodium: 138 mEq/L (ref 135–145)

## 2012-11-01 LAB — LIPID PANEL
Cholesterol: 171 mg/dL (ref 0–200)
HDL: 48.3 mg/dL (ref >39.00)
VLDL: 48.8 mg/dL — ABNORMAL HIGH (ref 0.0–40.0)

## 2012-11-11 ENCOUNTER — Ambulatory Visit: Payer: Medicare Other | Admitting: Vascular Surgery

## 2012-12-10 ENCOUNTER — Ambulatory Visit: Payer: Medicare Other | Admitting: Urology

## 2013-05-31 ENCOUNTER — Other Ambulatory Visit: Payer: Self-pay | Admitting: Vascular Surgery

## 2013-05-31 DIAGNOSIS — I714 Abdominal aortic aneurysm, without rupture, unspecified: Secondary | ICD-10-CM

## 2013-05-31 DIAGNOSIS — Z48812 Encounter for surgical aftercare following surgery on the circulatory system: Secondary | ICD-10-CM

## 2013-07-07 ENCOUNTER — Other Ambulatory Visit (HOSPITAL_COMMUNITY): Payer: Self-pay | Admitting: Orthopedic Surgery

## 2013-07-07 ENCOUNTER — Inpatient Hospital Stay: Admit: 2013-07-07 | Payer: Self-pay | Admitting: Orthopedic Surgery

## 2013-07-07 SURGERY — IRRIGATION AND DEBRIDEMENT EXTREMITY
Anesthesia: General | Site: Ankle | Laterality: Right

## 2013-08-03 ENCOUNTER — Encounter: Payer: Self-pay | Admitting: Vascular Surgery

## 2013-08-04 ENCOUNTER — Inpatient Hospital Stay (HOSPITAL_COMMUNITY): Admission: RE | Admit: 2013-08-04 | Payer: Medicare Other | Source: Ambulatory Visit

## 2013-08-04 ENCOUNTER — Inpatient Hospital Stay (HOSPITAL_COMMUNITY): Admission: RE | Admit: 2013-08-04 | Payer: Self-pay | Source: Ambulatory Visit

## 2013-08-04 ENCOUNTER — Ambulatory Visit: Payer: Medicare Other | Admitting: Vascular Surgery

## 2013-09-02 ENCOUNTER — Ambulatory Visit (INDEPENDENT_AMBULATORY_CARE_PROVIDER_SITE_OTHER): Payer: Medicare Other | Admitting: Urology

## 2013-09-02 DIAGNOSIS — N3941 Urge incontinence: Secondary | ICD-10-CM

## 2013-09-02 DIAGNOSIS — R972 Elevated prostate specific antigen [PSA]: Secondary | ICD-10-CM

## 2013-09-02 DIAGNOSIS — N401 Enlarged prostate with lower urinary tract symptoms: Secondary | ICD-10-CM

## 2013-10-14 ENCOUNTER — Ambulatory Visit: Payer: Medicare Other | Admitting: Urology

## 2013-11-03 ENCOUNTER — Inpatient Hospital Stay (HOSPITAL_COMMUNITY)
Admission: EM | Admit: 2013-11-03 | Discharge: 2013-11-05 | DRG: 872 | Disposition: A | Discharge status: Home / Self Care | Payer: Medicare Other | Attending: Pulmonary Disease | Admitting: Pulmonary Disease

## 2013-11-03 ENCOUNTER — Encounter (HOSPITAL_COMMUNITY): Payer: Self-pay | Admitting: Emergency Medicine

## 2013-11-03 DIAGNOSIS — E872 Acidosis, unspecified: Secondary | ICD-10-CM | POA: Diagnosis present

## 2013-11-03 DIAGNOSIS — I10 Essential (primary) hypertension: Secondary | ICD-10-CM | POA: Diagnosis present

## 2013-11-03 DIAGNOSIS — N12 Tubulo-interstitial nephritis, not specified as acute or chronic: Secondary | ICD-10-CM | POA: Diagnosis present

## 2013-11-03 DIAGNOSIS — Z9884 Bariatric surgery status: Secondary | ICD-10-CM

## 2013-11-03 DIAGNOSIS — Z833 Family history of diabetes mellitus: Secondary | ICD-10-CM

## 2013-11-03 DIAGNOSIS — N390 Urinary tract infection, site not specified: Secondary | ICD-10-CM | POA: Diagnosis present

## 2013-11-03 DIAGNOSIS — Z87891 Personal history of nicotine dependence: Secondary | ICD-10-CM

## 2013-11-03 DIAGNOSIS — I252 Old myocardial infarction: Secondary | ICD-10-CM

## 2013-11-03 DIAGNOSIS — Z95828 Presence of other vascular implants and grafts: Secondary | ICD-10-CM

## 2013-11-03 DIAGNOSIS — Z923 Personal history of irradiation: Secondary | ICD-10-CM

## 2013-11-03 DIAGNOSIS — E871 Hypo-osmolality and hyponatremia: Secondary | ICD-10-CM | POA: Diagnosis present

## 2013-11-03 DIAGNOSIS — Z8521 Personal history of malignant neoplasm of larynx: Secondary | ICD-10-CM

## 2013-11-03 DIAGNOSIS — Z66 Do not resuscitate: Secondary | ICD-10-CM | POA: Diagnosis present

## 2013-11-03 DIAGNOSIS — R918 Other nonspecific abnormal finding of lung field: Secondary | ICD-10-CM | POA: Diagnosis present

## 2013-11-03 DIAGNOSIS — I714 Abdominal aortic aneurysm, without rupture, unspecified: Secondary | ICD-10-CM | POA: Diagnosis present

## 2013-11-03 DIAGNOSIS — Z8249 Family history of ischemic heart disease and other diseases of the circulatory system: Secondary | ICD-10-CM

## 2013-11-03 DIAGNOSIS — E669 Obesity, unspecified: Secondary | ICD-10-CM | POA: Diagnosis present

## 2013-11-03 DIAGNOSIS — N400 Enlarged prostate without lower urinary tract symptoms: Secondary | ICD-10-CM | POA: Diagnosis present

## 2013-11-03 DIAGNOSIS — I679 Cerebrovascular disease, unspecified: Secondary | ICD-10-CM | POA: Diagnosis present

## 2013-11-03 DIAGNOSIS — C329 Malignant neoplasm of larynx, unspecified: Secondary | ICD-10-CM | POA: Diagnosis present

## 2013-11-03 DIAGNOSIS — R197 Diarrhea, unspecified: Secondary | ICD-10-CM

## 2013-11-03 DIAGNOSIS — Z801 Family history of malignant neoplasm of trachea, bronchus and lung: Secondary | ICD-10-CM

## 2013-11-03 DIAGNOSIS — I739 Peripheral vascular disease, unspecified: Secondary | ICD-10-CM | POA: Diagnosis present

## 2013-11-03 DIAGNOSIS — Z9861 Coronary angioplasty status: Secondary | ICD-10-CM

## 2013-11-03 DIAGNOSIS — A4151 Sepsis due to Escherichia coli [E. coli]: Secondary | ICD-10-CM

## 2013-11-03 DIAGNOSIS — Z6841 Body Mass Index (BMI) 40.0 and over, adult: Secondary | ICD-10-CM

## 2013-11-03 DIAGNOSIS — G4733 Obstructive sleep apnea (adult) (pediatric): Secondary | ICD-10-CM | POA: Diagnosis present

## 2013-11-03 DIAGNOSIS — A419 Sepsis, unspecified organism: Principal | ICD-10-CM | POA: Diagnosis present

## 2013-11-03 DIAGNOSIS — I2510 Atherosclerotic heart disease of native coronary artery without angina pectoris: Secondary | ICD-10-CM | POA: Diagnosis present

## 2013-11-03 DIAGNOSIS — G4730 Sleep apnea, unspecified: Secondary | ICD-10-CM | POA: Diagnosis present

## 2013-11-03 DIAGNOSIS — Z79899 Other long term (current) drug therapy: Secondary | ICD-10-CM

## 2013-11-03 DIAGNOSIS — I724 Aneurysm of artery of lower extremity: Secondary | ICD-10-CM | POA: Diagnosis present

## 2013-11-03 LAB — CBC WITH DIFFERENTIAL/PLATELET
Basophils Absolute: 0 10*3/uL (ref 0.0–0.1)
Basophils Relative: 0 % (ref 0–1)
EOS PCT: 0 % (ref 0–5)
Eosinophils Absolute: 0 10*3/uL (ref 0.0–0.7)
HCT: 40.8 % (ref 39.0–52.0)
HEMOGLOBIN: 14.2 g/dL (ref 13.0–17.0)
LYMPHS ABS: 0.6 10*3/uL — AB (ref 0.7–4.0)
Lymphocytes Relative: 4 % — ABNORMAL LOW (ref 12–46)
MCH: 31.3 pg (ref 26.0–34.0)
MCHC: 34.8 g/dL (ref 30.0–36.0)
MCV: 89.9 fL (ref 78.0–100.0)
MONOS PCT: 8 % (ref 3–12)
Monocytes Absolute: 1.1 10*3/uL — ABNORMAL HIGH (ref 0.1–1.0)
NEUTROS PCT: 87 % — AB (ref 43–77)
Neutro Abs: 11.5 10*3/uL — ABNORMAL HIGH (ref 1.7–7.7)
Platelets: 112 10*3/uL — ABNORMAL LOW (ref 150–400)
RBC: 4.54 MIL/uL (ref 4.22–5.81)
RDW: 13.4 % (ref 11.5–15.5)
WBC: 13.1 10*3/uL — ABNORMAL HIGH (ref 4.0–10.5)

## 2013-11-03 LAB — URINE MICROSCOPIC-ADD ON

## 2013-11-03 LAB — COMPREHENSIVE METABOLIC PANEL
ALT: 21 U/L (ref 0–53)
AST: 21 U/L (ref 0–37)
Albumin: 2.8 g/dL — ABNORMAL LOW (ref 3.5–5.2)
Alkaline Phosphatase: 93 U/L (ref 39–117)
Anion gap: 16 — ABNORMAL HIGH (ref 5–15)
BUN: 17 mg/dL (ref 6–23)
CALCIUM: 8.4 mg/dL (ref 8.4–10.5)
CO2: 22 mEq/L (ref 19–32)
Chloride: 91 mEq/L — ABNORMAL LOW (ref 96–112)
Creatinine, Ser: 0.97 mg/dL (ref 0.50–1.35)
GFR calc Af Amer: >90 mL/min (ref >90)
GFR calc non Af Amer: 85 mL/min — ABNORMAL LOW (ref >90)
Glucose, Bld: 206 mg/dL — ABNORMAL HIGH (ref 70–99)
Potassium: 3.6 mEq/L — ABNORMAL LOW (ref 3.7–5.3)
SODIUM: 129 meq/L — AB (ref 137–147)
TOTAL PROTEIN: 7.1 g/dL (ref 6.0–8.3)
Total Bilirubin: 0.7 mg/dL (ref 0.3–1.2)

## 2013-11-03 LAB — URINALYSIS, ROUTINE W REFLEX MICROSCOPIC
Bilirubin Urine: NEGATIVE
Glucose, UA: 250 mg/dL — AB
Ketones, ur: 40 mg/dL — AB
NITRITE: POSITIVE — AB
PH: 6 (ref 5.0–8.0)
Protein, ur: 100 mg/dL — AB
SPECIFIC GRAVITY, URINE: 1.015 (ref 1.005–1.030)
Urobilinogen, UA: 1 mg/dL (ref 0.0–1.0)

## 2013-11-03 LAB — LACTIC ACID, PLASMA: Lactic Acid, Venous: 1.9 mmol/L (ref 0.5–2.2)

## 2013-11-03 MED ORDER — PIPERACILLIN-TAZOBACTAM 3.375 G IVPB 30 MIN
3.3750 g | Freq: Three times a day (TID) | INTRAVENOUS | Status: DC
  Administered 2013-11-03: 3.375 g via INTRAVENOUS
  Filled 2013-11-03 (×4): qty 50

## 2013-11-03 MED ORDER — ONDANSETRON HCL 4 MG/2ML IJ SOLN
4.0000 mg | Freq: Once | INTRAMUSCULAR | Status: AC
  Administered 2013-11-03: 4 mg via INTRAVENOUS
  Filled 2013-11-03: qty 2

## 2013-11-03 MED ORDER — TAMSULOSIN HCL 0.4 MG PO CAPS
0.4000 mg | ORAL_CAPSULE | Freq: Every day | ORAL | Status: DC
  Administered 2013-11-03 – 2013-11-05 (×3): 0.4 mg via ORAL
  Filled 2013-11-03 (×3): qty 1

## 2013-11-03 MED ORDER — ACETAMINOPHEN 325 MG PO TABS
650.0000 mg | ORAL_TABLET | ORAL | Status: DC | PRN
  Administered 2013-11-03: 650 mg via ORAL
  Filled 2013-11-03: qty 2

## 2013-11-03 MED ORDER — ZOLPIDEM TARTRATE 5 MG PO TABS
10.0000 mg | ORAL_TABLET | Freq: Every evening | ORAL | Status: DC | PRN
  Administered 2013-11-05: 10 mg via ORAL
  Filled 2013-11-03: qty 2

## 2013-11-03 MED ORDER — SODIUM CHLORIDE 0.9 % IJ SOLN
3.0000 mL | Freq: Two times a day (BID) | INTRAMUSCULAR | Status: DC
Start: 2013-11-03 — End: 2013-11-05
  Administered 2013-11-03 – 2013-11-04 (×2): 3 mL via INTRAVENOUS

## 2013-11-03 MED ORDER — HEPARIN SODIUM (PORCINE) 5000 UNIT/ML IJ SOLN
5000.0000 [IU] | Freq: Three times a day (TID) | INTRAMUSCULAR | Status: DC
  Administered 2013-11-03 – 2013-11-05 (×6): 5000 [IU] via SUBCUTANEOUS
  Filled 2013-11-03 (×6): qty 1

## 2013-11-03 MED ORDER — PREGABALIN 75 MG PO CAPS
75.0000 mg | ORAL_CAPSULE | Freq: Three times a day (TID) | ORAL | Status: DC
  Administered 2013-11-03 – 2013-11-05 (×7): 75 mg via ORAL
  Filled 2013-11-03 (×7): qty 1

## 2013-11-03 MED ORDER — DEXTROSE 5 % IV SOLN
1.0000 g | Freq: Once | INTRAVENOUS | Status: AC
  Administered 2013-11-03: 1 g via INTRAVENOUS
  Filled 2013-11-03: qty 10

## 2013-11-03 MED ORDER — SODIUM CHLORIDE 0.9 % IV SOLN
INTRAVENOUS | Status: AC
  Administered 2013-11-03: 15:00:00 via INTRAVENOUS

## 2013-11-03 MED ORDER — ACETAMINOPHEN 500 MG PO TABS
1000.0000 mg | ORAL_TABLET | Freq: Once | ORAL | Status: AC
  Administered 2013-11-03: 1000 mg via ORAL
  Filled 2013-11-03: qty 2

## 2013-11-03 MED ORDER — PIPERACILLIN-TAZOBACTAM 3.375 G IVPB
3.3750 g | Freq: Three times a day (TID) | INTRAVENOUS | Status: DC
  Administered 2013-11-03 – 2013-11-05 (×6): 3.375 g via INTRAVENOUS
  Filled 2013-11-03 (×12): qty 50

## 2013-11-03 MED ORDER — SODIUM CHLORIDE 0.9 % IV BOLUS (SEPSIS)
1000.0000 mL | Freq: Once | INTRAVENOUS | Status: AC
  Administered 2013-11-03: 1000 mL via INTRAVENOUS

## 2013-11-03 MED ORDER — SODIUM CHLORIDE 0.9 % IV SOLN
Freq: Once | INTRAVENOUS | Status: AC
  Administered 2013-11-03: 14:00:00 via INTRAVENOUS

## 2013-11-03 MED ORDER — POTASSIUM CHLORIDE CRYS ER 20 MEQ PO TBCR
20.0000 meq | EXTENDED_RELEASE_TABLET | Freq: Every day | ORAL | Status: DC
  Administered 2013-11-03 – 2013-11-05 (×3): 20 meq via ORAL
  Filled 2013-11-03 (×3): qty 1

## 2013-11-03 NOTE — H&P (Signed)
[facility] History and Physical  Mathew Padilla YTK:[id number] DOB: 1950-06-12 DOA: 11/03/2013  Referring physician: ED PCP: Alonza Bogus, MD  Specialists: none  Chief Complaint: Fever, n, v  HPI:  63 y/o ?, h/o LAp band 12/19/08, throat cancer s/p resection XRT [Dr Redmond Pulling in [place]], Htn, CAd s/p Angio 3/2000with LHC 03/10/2012, OSA on CPAP, AAA stent graft 2007 witt h/o PAD and multiple surgeries presented to [facility] hospital with four-day history of fever. Patient states that beginning 10/31/13 patient started to have fever 103, severe headache, back pain, frequency of urine about 15 times per night as well as diarrhea. The mean issue initially was diarrhea. He states he took Pepto-Bismol for this and some other over-the-counter medications this temporarily stop then gradually became clear water and clear liquid. He's not eaten much in the past couple of days He's had some episodes of retching and nausea but no significant vomiting. His appetite has been decreased He in addition has had some abdominal pain mainly across his flank and in his left lower quadrant.  Denies exposure to any ill contacts, denies any new rash, denies any new medications, denies any exotic travel. In addition he's had increasing frequency of urination over the past 2-3 days. He does not think that he has been prostate [name] the past He tells me that he has had a history of diverticulitis about 10 years ago that was with no packing to this which is more diarrhea it. He goes on to state that he has been dizzy but is not sure worsens and feels as if his head up he He is been coughing with some nonproductive sputum.  Emergency room workup revealed  Sodium 129 potassium 3.6 chloride 91 anion gap 16 BUN 17 creatinine 0.9 White count 13.1 platelets 112 differential 87 Clean catch urine to numerous to count WBCs many bacteria   blood culture and urine culture performed    Review of Systems: The patient denies   Chest pain Shortness breath Unilateral weakness Falls Blurred vision or double vision   Past Medical History  Diagnosis Date  . Arteriosclerotic cardiovascular disease (ASCVD) 2000 he    MI in 1999; PTCA of PDA and 2nd marginal in 3/00  . AAA (abdominal aortic aneurysm) 2007    stent graft repair 12/07  . Laryngeal carcinoma 2010    resection and radiation therapy 2010-11  . Diverticulitis 2006  . Obesity     BMI 47; lap band surgery 2010  . Gastroesophageal reflux disease     Hiatal hernia; previously treated with PPI  . Tobacco abuse, in remission     40 pack years; discontinued in 2000  . Cerebrovascular disease   . Cholelithiasis 11/28/2010  . Elevated PSA 06/2011  . Sleep apnea     BiPAP mask utilized        2 yrs sleep study    dr Redmond Pulling  . Hypertension     Mild; controlled with a single agent       dr Lattie Haw      cardiac md  . Cough, persistent   . Popliteal aneurysm     popliteal bypass-2013   Past Surgical History  Procedure Laterality Date  . Peripheral arterial stent graft  2007    for abdominal aortic aneurysm  . Larynx surgery  2010    Resection of carcinoma  . Open anterior shoulder reconstruction  2011    Left  . Laparoscopic gastric banding  2010  . Eye surgery    .  Cataract extraction    . Colonoscopy  10/2007  . Femoral-femoral bypass graft  04/18/2011    Procedure: BYPASS GRAFT FEMORAL-FEMORAL ARTERY;  Surgeon: Theotis Burrow, MD;  Location: [facility] OR;  Service: Vascular;  Laterality: Bilateral;  Left to right femoral to femoral bypass   . Femoral-popliteal bypass graft  04/18/2011    Procedure: BYPASS GRAFT FEMORAL-POPLITEAL ARTERY;  Surgeon: Theotis Burrow, MD;  Location: [facility] OR;  Service: Vascular;  Laterality: Right;  Right femoral popliteal bypass with propaten gortex graft  . Cardiac catheterization  2000    PCI-stent  . Pr vein bypass graft,aorto-fem-pop  07-23-11    Left AK to BK popliteal BPG using rev. saphenous vein   Social History:   History   Social History Narrative   Married, past employed, does not get regular exercise.     Allergies  Allergen Reactions  . Aspirin     Daily dose upsets stomach  . Statins Hives and Rash    Family History  Problem Relation Age of Onset  . Heart attack Father     deceased  . Heart failure Father   . Hyperlipidemia Father   . Hypertension Father   . Heart disease Father   . Lung cancer Mother     deceased   . Hypertension Mother   . Hyperlipidemia Mother   . Heart failure Mother   . Diabetes Mother   . Cancer Sister     vaginal  . Diabetes Sister     Prior to Admission medications   Medication Sig Start Date End Date Taking? Authorizing Provider  amLODipine (NORVASC) 5 MG tablet Take 5 mg by mouth daily.   Yes Historical Provider, MD  ciprofloxacin (CIPRO) 250 MG tablet Take 250 mg by mouth 2 (two) times daily.   Yes Historical Provider, MD  Dapagliflozin Propanediol (FARXIGA) 5 MG TABS Take 1 tablet by mouth daily.   Yes Historical Provider, MD  furosemide (LASIX) 40 MG tablet Take 1 tablet (40 mg total) by mouth daily. 10/25/12  Yes Burnell Blanks, MD  ibuprofen (ADVIL,MOTRIN) 200 MG tablet Take 400 mg by mouth every 6 (six) hours as needed for moderate pain.   Yes Historical Provider, MD  irbesartan (AVAPRO) 300 MG tablet Take 300 mg by mouth daily.   Yes Historical Provider, MD  lubiprostone (AMITIZA) 24 MCG capsule Take 24 mcg by mouth daily.   Yes Historical Provider, MD  nitroGLYCERIN (NITROSTAT) 0.4 MG SL tablet Place 1 tablet (0.4 mg total) under the tongue every 5 (five) minutes as needed for chest pain. 04/01/12  Yes Burnell Blanks, MD  potassium chloride (K-DUR,KLOR-CON) 20 MEQ tablet Take 1 tablet (20 mEq total) by mouth daily. 10/25/12  Yes Burnell Blanks, MD  pregabalin (LYRICA) 75 MG capsule Take 75 mg by mouth 3 (three) times daily.   Yes Historical Provider, MD  Tamsulosin HCl (FLOMAX) 0.4 MG CAPS Take 0.4 mg by mouth daily  after supper.   04/21/11  Yes Regina J Roczniak, PA-C  zolpidem (AMBIEN) 10 MG tablet Take 10 mg by mouth at bedtime as needed for sleep.   Yes Historical Provider, MD   Physical Exam: Filed Vitals:   11/03/13 1004 11/03/13 1143 11/03/13 1230  BP: 142/125  113/48  Pulse:   28  Temp: 103.3 F (39.6 C) 98.6 F (37 C)   TempSrc: Oral Oral   Resp: 20    Height: 5\' 11"  (1.803 m)    Weight: 145.151 kg (320 lb)  SpO2: 94%  92%     General:   Alert pleasant hard of hearing  Eyes:  EOMI NCAT no pallor no icterus  ENT:  throat clear no tonsillar adenopathy or redness to throat, both ears are clear  Neck:  soft supple no lymphadenopathy no bruit no JVD  Cardiovascular:  this will is still no murmur or gallop distant heart sounds slightly tachycardic  Respiratory:  clinically clear no added sound  Abdomen:  tender in left lower quadrant no CVA tenderness at time of my exam  Skin:  no lower extremity edema  Musculoskeletal:  range of motion intact  Psychiatric:  euthymic  Neurologic:  grossly intact moving all 4 limbs equally  Labs on Admission:  Basic Metabolic Panel:  Recent Labs Lab 11/03/13 1029  NA 129*  K 3.6*  CL 91*  CO2 22  GLUCOSE 206*  BUN 17  CREATININE 0.97  CALCIUM 8.4   Liver Function Tests:  Recent Labs Lab 11/03/13 1029  AST 21  ALT 21  ALKPHOS 93  BILITOT 0.7  PROT 7.1  ALBUMIN 2.8*   No results found for this basename: LIPASE, AMYLASE,  in the last 168 hours No results found for this basename: AMMONIA,  in the last 168 hours CBC:  Recent Labs Lab 11/03/13 1029  WBC 13.1*  NEUTROABS 11.5*  HGB 14.2  HCT 40.8  MCV 89.9  PLT 112*   Cardiac Enzymes: No results found for this basename: CKTOTAL, CKMB, CKMBINDEX, TROPONINI,  in the last 168 hours  BNP (last 3 results) No results found for this basename: PROBNP,  in the last 8760 hours CBG: No results found for this basename: GLUCAP,  in the last 168 hours  Radiological Exams  on Admission: No results found.  EKG: Independently reviewed.  none performed  Assessment/Plan Principal Problem:   Sepsis-unclear source.  Patient meets early sepsis criteria but has clinically improved with 2 L of IV normal saline and conceivably be managed on the floor He was given one dose of Rocephin for presumed urinary tract infection and his symptoms may be that however he also is complaining of predominant diarrhea for the same at 10 and he might have a colitis as well. Overall I am not sure as to what the source of his infection is. As such I will run his antibiotics to Zosyn to cover most intra-abdominal pathogens and we will await the results of his blood and urine culture. In view of his diarrhea I have also in the GI pathogen panel although the clinical picture is that he has clear liquid stool which sounds more like a viral diarrhea. If this persists or continues we may be able to use Imodium Active Problems:   Hypotonic hyponatremia-probably secondary to continuous GI losses as well as Clarice is from continued use of Lasix but we will hold Lasix for now and repeat saline at 75 cc per hour and this should resolve and that time from   Mild metabolic acidosis-probably because of #1. Repeat labs in a.m.  I will ask for a lactic acid as well   HYPERTENSION-borderline hypotensive. Have held amlodipine 5, avapro 300   Laryngeal carcinoma-status post XRT and resection-stable   Obesity-status post lap band in 2000 10 Dr. Lucia Gaskins. If continued abdominal pain or issues, will image stomach   Sleep apnea-continue BiPAP   Abdominal aneurysm without mention of rupture-status post repair stable   Popliteal aneurysm  S/P bypass graft of extremity   History of laparoscopic adjustable gastric  banding, APL, 12/19/2008.  Time spent:  75 minutes DO NOT RESUSCITATE status Inpatient telemetry  Bralen Wiltgen, [facility] Pager [phone]  If 7PM-7AM, please contact  night-coverage [email or web address] Password [email or web address] 11/03/2013, 1:21 PM

## 2013-11-03 NOTE — ED Notes (Signed)
Pt states he has been unable to take BP meds because of the nausea and heaving.

## 2013-11-03 NOTE — ED Notes (Signed)
Pt states fever, chills, urinary frequency, diarrhea, nausea began Monday.

## 2013-11-03 NOTE — ED Provider Notes (Signed)
CSN: [id number]     Arrival date & time 11/03/13  6578 History  This chart was scribed for Nat Christen, MD by Elby Beck, ED Scribe. This patient was seen in room APA18/APA18 and the patient's care was started at 10:09 AM.   Chief Complaint  Patient presents with  . Urinary Frequency   The history is provided by the patient. No language interpreter was used.    HPI Comments: YERIK ZERINGUE is a 63 y.o. male who presents to the Emergency Department complaining of increased urinary frequency onset 3 days ago. He states that he has been urinating about 15 times a night for the past few nights. He reports associated bilateral lower back pain over the past few days. He reports that his urine has been yellow despite his attempts at increased fluid intake. He also reports associated nausea and retching, but he denies any associated vomiting. He further reports associated headache, fever, chills, diarrhea and episodes of diaphoresis over the past 2 days. He states that he has been having multiple episodes of "severe", watery diarrhea per day. He denies any episodes of emesis. His temperature in the ED was taken to be 103.3 F. He states that he has been taking Ibuprofen with minimal relief of his fever.     Past Medical History  Diagnosis Date  . Arteriosclerotic cardiovascular disease (ASCVD) 2000 he    MI in 1999; PTCA of PDA and 2nd marginal in 3/00  . AAA (abdominal aortic aneurysm) 2007    stent graft repair 12/07  . Laryngeal carcinoma 2010    resection and radiation therapy 2010-11  . Diverticulitis 2006  . Obesity     BMI 47; lap band surgery 2010  . Gastroesophageal reflux disease     Hiatal hernia; previously treated with PPI  . Tobacco abuse, in remission     40 pack years; discontinued in 2000  . Cerebrovascular disease   . Cholelithiasis 11/28/2010  . Elevated PSA 06/2011  . Sleep apnea     BiPAP mask utilized        2 yrs sleep study    dr Redmond Pulling  . Hypertension     Mild;  controlled with a single agent       dr Lattie Haw      cardiac md  . Cough, persistent   . Popliteal aneurysm     popliteal bypass-2013   Past Surgical History  Procedure Laterality Date  . Peripheral arterial stent graft  2007    for abdominal aortic aneurysm  . Larynx surgery  2010    Resection of carcinoma  . Open anterior shoulder reconstruction  2011    Left  . Laparoscopic gastric banding  2010  . Eye surgery    . Cataract extraction    . Colonoscopy  10/2007  . Femoral-femoral bypass graft  04/18/2011    Procedure: BYPASS GRAFT FEMORAL-FEMORAL ARTERY;  Surgeon: Theotis Burrow, MD;  Location: [facility] OR;  Service: Vascular;  Laterality: Bilateral;  Left to right femoral to femoral bypass   . Femoral-popliteal bypass graft  04/18/2011    Procedure: BYPASS GRAFT FEMORAL-POPLITEAL ARTERY;  Surgeon: Theotis Burrow, MD;  Location: [facility] OR;  Service: Vascular;  Laterality: Right;  Right femoral popliteal bypass with propaten gortex graft  . Cardiac catheterization  2000    PCI-stent  . Pr vein bypass graft,aorto-fem-pop  07-23-11    Left AK to BK popliteal BPG using rev. saphenous vein   Family History  Problem Relation  Age of Onset  . Heart attack Father     deceased  . Heart failure Father   . Hyperlipidemia Father   . Hypertension Father   . Heart disease Father   . Lung cancer Mother     deceased   . Hypertension Mother   . Hyperlipidemia Mother   . Heart failure Mother   . Diabetes Mother   . Cancer Sister     vaginal  . Diabetes Sister    History  Substance Use Topics  . Smoking status: Former Smoker -- 2.00 packs/day for 30 years    Types: Cigarettes    Quit date: 11/26/1997  . Smokeless tobacco: Never Used     Comment: 40 pack years; quit 2000  . Alcohol Use: No    Review of Systems A complete 10 system review of systems was obtained and all systems are negative except as noted in the HPI and PMH.   Allergies  Aspirin and Statins  Home Medications     Triage Vitals: BP 142/125  Temp(Src) 103.3 F (39.6 C) (Oral)  Resp 20  Ht 5\' 11"  (1.803 m)  Wt 320 lb (145.151 kg)  BMI 44.65 kg/m2  SpO2 94%  Physical Exam  Nursing note and vitals reviewed. Constitutional: He is oriented to person, place, and time. He appears well-developed and well-nourished.  Obese, not sweating currently  HENT:  Head: Normocephalic and atraumatic.  Eyes: Conjunctivae and EOM are normal. Pupils are equal, round, and reactive to light.  Neck: Normal range of motion. Neck supple.  Cardiovascular: Normal rate, regular rhythm and normal heart sounds.   Pulmonary/Chest: Effort normal and breath sounds normal.  Abdominal: Soft. Bowel sounds are normal.  Genitourinary:  No CVA tenderness  Musculoskeletal: Normal range of motion.  Neurological: He is alert and oriented to person, place, and time.  Skin: Skin is warm and dry. There is pallor.  Slightly pale  Psychiatric: He has a normal mood and affect. His behavior is normal.    ED Course  Procedures (including critical care time)  DIAGNOSTIC STUDIES: Oxygen Saturation is 94% on RA, adequate by my interpretation.    COORDINATION OF CARE: 10:16 AM- Will order IV fluids, Rocephin, UA and blood work. Also discussed possible plan for admission. Pt advised of plan for treatment and pt agrees.  Results for orders placed during the hospital encounter of 11/03/13  URINALYSIS, ROUTINE W REFLEX MICROSCOPIC      Result Value Ref Range   Color, Urine YELLOW  YELLOW   APPearance CLEAR  CLEAR   Specific Gravity, Urine 1.015  1.005 - 1.030   pH 6.0  5.0 - 8.0   Glucose, UA 250 (*) NEGATIVE mg/dL   Hgb urine dipstick LARGE (*) NEGATIVE   Bilirubin Urine NEGATIVE  NEGATIVE   Ketones, ur 40 (*) NEGATIVE mg/dL   Protein, ur 100 (*) NEGATIVE mg/dL   Urobilinogen, UA 1.0  0.0 - 1.0 mg/dL   Nitrite POSITIVE (*) NEGATIVE   Leukocytes, UA MODERATE (*) NEGATIVE  COMPREHENSIVE METABOLIC PANEL      Result Value Ref  Range   Sodium 129 (*) 137 - 147 mEq/L   Potassium 3.6 (*) 3.7 - 5.3 mEq/L   Chloride 91 (*) 96 - 112 mEq/L   CO2 22  19 - 32 mEq/L   Glucose, Bld 206 (*) 70 - 99 mg/dL   BUN 17  6 - 23 mg/dL   Creatinine, Ser 0.97  0.50 - 1.35 mg/dL   Calcium 8.4  8.4 - 10.5 mg/dL   Total Protein 7.1  6.0 - 8.3 g/dL   Albumin 2.8 (*) 3.5 - 5.2 g/dL   AST 21  0 - 37 U/L   ALT 21  0 - 53 U/L   Alkaline Phosphatase 93  39 - 117 U/L   Total Bilirubin 0.7  0.3 - 1.2 mg/dL   GFR calc non Af Amer 85 (*) >90 mL/min   GFR calc Af Amer >90  >90 mL/min   Anion gap 16 (*) 5 - 15  CBC WITH DIFFERENTIAL      Result Value Ref Range   WBC 13.1 (*) 4.0 - 10.5 K/uL   RBC 4.54  4.22 - 5.81 MIL/uL   Hemoglobin 14.2  13.0 - 17.0 g/dL   HCT 40.8  39.0 - 52.0 %   MCV 89.9  78.0 - 100.0 fL   MCH 31.3  26.0 - 34.0 pg   MCHC 34.8  30.0 - 36.0 g/dL   RDW 13.4  11.5 - 15.5 %   Platelets 112 (*) 150 - 400 K/uL   Neutrophils Relative % 87 (*) 43 - 77 %   Neutro Abs 11.5 (*) 1.7 - 7.7 K/uL   Lymphocytes Relative 4 (*) 12 - 46 %   Lymphs Abs 0.6 (*) 0.7 - 4.0 K/uL   Monocytes Relative 8  3 - 12 %   Monocytes Absolute 1.1 (*) 0.1 - 1.0 K/uL   Eosinophils Relative 0  0 - 5 %   Eosinophils Absolute 0.0  0.0 - 0.7 K/uL   Basophils Relative 0  0 - 1 %   Basophils Absolute 0.0  0.0 - 0.1 K/uL  URINE MICROSCOPIC-ADD ON      Result Value Ref Range   WBC, UA TOO NUMEROUS TO COUNT  <3 WBC/hpf   RBC / HPF TOO NUMEROUS TO COUNT  <3 RBC/hpf   Bacteria, UA MANY (*) RARE   Imaging Review No results found.   EKG Interpretation None      MDM   Final diagnoses:  Pyelonephritis    History and physical consistent with pyelonephritis. IV fluids, IV Rocephin, urine culture. Admit to general medicine. Patient rechecked at 1320 and he feels much better.   I personally performed the services described in this documentation, which was scribed in my presence. The recorded information has been reviewed and is accurate.   Nat Christen, MD 11/03/13 1326

## 2013-11-04 ENCOUNTER — Inpatient Hospital Stay (HOSPITAL_COMMUNITY): Payer: Medicare Other

## 2013-11-04 ENCOUNTER — Encounter (HOSPITAL_COMMUNITY): Payer: Self-pay | Admitting: Radiology

## 2013-11-04 DIAGNOSIS — N12 Tubulo-interstitial nephritis, not specified as acute or chronic: Secondary | ICD-10-CM

## 2013-11-04 DIAGNOSIS — R197 Diarrhea, unspecified: Secondary | ICD-10-CM

## 2013-11-04 DIAGNOSIS — A419 Sepsis, unspecified organism: Principal | ICD-10-CM

## 2013-11-04 LAB — CBC
HCT: 38.2 % — ABNORMAL LOW (ref 39.0–52.0)
HEMOGLOBIN: 12.8 g/dL — AB (ref 13.0–17.0)
MCH: 30.7 pg (ref 26.0–34.0)
MCHC: 33.5 g/dL (ref 30.0–36.0)
MCV: 91.6 fL (ref 78.0–100.0)
Platelets: 107 10*3/uL — ABNORMAL LOW (ref 150–400)
RBC: 4.17 MIL/uL — AB (ref 4.22–5.81)
RDW: 13.7 % (ref 11.5–15.5)
WBC: 7.9 10*3/uL (ref 4.0–10.5)

## 2013-11-04 LAB — COMPREHENSIVE METABOLIC PANEL
ALK PHOS: 75 U/L (ref 39–117)
ALT: 36 U/L (ref 0–53)
AST: 36 U/L (ref 0–37)
Albumin: 2.5 g/dL — ABNORMAL LOW (ref 3.5–5.2)
Anion gap: 13 (ref 5–15)
BUN: 14 mg/dL (ref 6–23)
CO2: 25 meq/L (ref 19–32)
Calcium: 8 mg/dL — ABNORMAL LOW (ref 8.4–10.5)
Chloride: 99 mEq/L (ref 96–112)
Creatinine, Ser: 0.94 mg/dL (ref 0.50–1.35)
GFR calc Af Amer: >90 mL/min (ref >90)
GFR calc non Af Amer: 86 mL/min — ABNORMAL LOW (ref >90)
Glucose, Bld: 180 mg/dL — ABNORMAL HIGH (ref 70–99)
Potassium: 4 mEq/L (ref 3.7–5.3)
SODIUM: 137 meq/L (ref 137–147)
TOTAL PROTEIN: 6.7 g/dL (ref 6.0–8.3)
Total Bilirubin: 0.6 mg/dL (ref 0.3–1.2)

## 2013-11-04 LAB — PROTIME-INR
INR: 1.24 (ref 0.00–1.49)
Prothrombin Time: 15.6 seconds — ABNORMAL HIGH (ref 11.6–15.2)

## 2013-11-04 MED ORDER — IOHEXOL 300 MG/ML  SOLN
100.0000 mL | Freq: Once | INTRAMUSCULAR | Status: AC | PRN
  Administered 2013-11-04: 100 mL via INTRAVENOUS

## 2013-11-04 MED ORDER — IOHEXOL 300 MG/ML  SOLN
50.0000 mL | Freq: Once | INTRAMUSCULAR | Status: AC | PRN
  Administered 2013-11-04: 50 mL via ORAL

## 2013-11-04 NOTE — Progress Notes (Signed)
Subjective: He was admitted yesterday with fever nausea diarrhea and presumed urinary tract infection and he has improved but still had temperature to 103 last night. He is medically complicated with coronary artery occlusive disease obstructive sleep apnea abdominal aortic aneurysm a throat cancer morbid obesity status post lap band surgery. He also has some symptoms that may be IBS.  Objective: Vital signs in last 24 hours: Temp:  [97.9 F (36.6 C)-103.3 F (39.6 C)] 99 F (37.2 C) (07/10 0653) Pulse Rate:  [28-92] 92 (07/10 0653) Resp:  [20] 20 (07/10 0653) BP: (86-142)/(40-125) 95/41 mmHg (07/10 0653) SpO2:  [92 %-98 %] 97 % (07/10 0653) Weight:  [145.151 kg (320 lb)-153.769 kg (339 lb)] 153.769 kg (339 lb) (07/09 1453) Weight change:  Last BM Date: 11/02/13  Intake/Output from previous day: 07/09 0701 - 07/10 0700 In: 840 [P.O.:840] Out: 1625 [Urine:1625]  PHYSICAL EXAM General appearance: alert, cooperative, mild distress and morbidly obese Resp: clear to auscultation bilaterally Cardio: regular rate and rhythm, S1, S2 normal, no murmur, click, rub or gallop GI: Mild diffuse tenderness Extremities: extremities normal, atraumatic, no cyanosis or edema  Lab Results:  Results for orders placed during the hospital encounter of 11/03/13 (from the past 48 hour(s))  URINALYSIS, ROUTINE W REFLEX MICROSCOPIC     Status: Abnormal   Collection Time    11/03/13 10:21 AM      Result Value Ref Range   Color, Urine YELLOW  YELLOW   APPearance CLEAR  CLEAR   Specific Gravity, Urine 1.015  1.005 - 1.030   pH 6.0  5.0 - 8.0   Glucose, UA 250 (*) NEGATIVE mg/dL   Hgb urine dipstick LARGE (*) NEGATIVE   Bilirubin Urine NEGATIVE  NEGATIVE   Ketones, ur 40 (*) NEGATIVE mg/dL   Protein, ur 100 (*) NEGATIVE mg/dL   Urobilinogen, UA 1.0  0.0 - 1.0 mg/dL   Nitrite POSITIVE (*) NEGATIVE   Leukocytes, UA MODERATE (*) NEGATIVE  URINE MICROSCOPIC-ADD ON     Status: Abnormal   Collection Time     11/03/13 10:21 AM      Result Value Ref Range   WBC, UA TOO NUMEROUS TO COUNT  <3 WBC/hpf   RBC / HPF TOO NUMEROUS TO COUNT  <3 RBC/hpf   Bacteria, UA MANY (*) RARE  COMPREHENSIVE METABOLIC PANEL     Status: Abnormal   Collection Time    11/03/13 10:29 AM      Result Value Ref Range   Sodium 129 (*) 137 - 147 mEq/Padilla   Potassium 3.6 (*) 3.7 - 5.3 mEq/Padilla   Chloride 91 (*) 96 - 112 mEq/Padilla   CO2 22  19 - 32 mEq/Padilla   Glucose, Bld 206 (*) 70 - 99 mg/dL   BUN 17  6 - 23 mg/dL   Creatinine, Ser 0.97  0.50 - 1.35 mg/dL   Calcium 8.4  8.4 - 10.5 mg/dL   Total Protein 7.1  6.0 - 8.3 g/dL   Albumin 2.8 (*) 3.5 - 5.2 g/dL   AST 21  0 - 37 U/Padilla   ALT 21  0 - 53 U/Padilla   Alkaline Phosphatase 93  39 - 117 U/Padilla   Total Bilirubin 0.7  0.3 - 1.2 mg/dL   GFR calc non Af Amer 85 (*) >90 mL/min   GFR calc Af Amer >90  >90 mL/min   Comment: (NOTE)     The eGFR has been calculated using the CKD EPI equation.     This calculation  has not been validated in all clinical situations.     eGFR's persistently <90 mL/min signify possible Chronic Kidney     Disease.   Anion gap 16 (*) 5 - 15  CBC WITH DIFFERENTIAL     Status: Abnormal   Collection Time    11/03/13 10:29 AM      Result Value Ref Range   WBC 13.1 (*) 4.0 - 10.5 K/uL   RBC 4.54  4.22 - 5.81 MIL/uL   Hemoglobin 14.2  13.0 - 17.0 g/dL   HCT 40.8  39.0 - 52.0 %   MCV 89.9  78.0 - 100.0 fL   MCH 31.3  26.0 - 34.0 pg   MCHC 34.8  30.0 - 36.0 g/dL   RDW 13.4  11.5 - 15.5 %   Platelets 112 (*) 150 - 400 K/uL   Comment: PLATELET COUNT CONFIRMED BY SMEAR     SPECIMEN CHECKED FOR CLOTS   Neutrophils Relative % 87 (*) 43 - 77 %   Neutro Abs 11.5 (*) 1.7 - 7.7 K/uL   Lymphocytes Relative 4 (*) 12 - 46 %   Lymphs Abs 0.6 (*) 0.7 - 4.0 K/uL   Monocytes Relative 8  3 - 12 %   Monocytes Absolute 1.1 (*) 0.1 - 1.0 K/uL   Eosinophils Relative 0  0 - 5 %   Eosinophils Absolute 0.0  0.0 - 0.7 K/uL   Basophils Relative 0  0 - 1 %   Basophils Absolute 0.0   0.0 - 0.1 K/uL  CULTURE, BLOOD (ROUTINE X 2)     Status: None   Collection Time    11/03/13 10:29 AM      Result Value Ref Range   Specimen Description BLOOD LEFT ANTECUBITAL     Special Requests BOTTLES DRAWN AEROBIC ONLY 8CC     Culture NO GROWTH <24 HRS     Report Status PENDING    CULTURE, BLOOD (ROUTINE X 2)     Status: None   Collection Time    11/03/13 10:30 AM      Result Value Ref Range   Specimen Description BLOOD RIGHT HAND DRAWN BY RN SN     Special Requests BOTTLES DRAWN AEROBIC AND ANAEROBIC 10CC     Culture NO GROWTH <24 HRS     Report Status PENDING    LACTIC ACID, PLASMA     Status: None   Collection Time    11/03/13  3:24 PM      Result Value Ref Range   Lactic Acid, Venous 1.9  0.5 - 2.2 mmol/Padilla  COMPREHENSIVE METABOLIC PANEL     Status: Abnormal   Collection Time    11/04/13  5:09 AM      Result Value Ref Range   Sodium 137  137 - 147 mEq/Padilla   Comment: DELTA CHECK NOTED   Potassium 4.0  3.7 - 5.3 mEq/Padilla   Chloride 99  96 - 112 mEq/Padilla   CO2 25  19 - 32 mEq/Padilla   Glucose, Bld 180 (*) 70 - 99 mg/dL   BUN 14  6 - 23 mg/dL   Creatinine, Ser 0.94  0.50 - 1.35 mg/dL   Calcium 8.0 (*) 8.4 - 10.5 mg/dL   Total Protein 6.7  6.0 - 8.3 g/dL   Albumin 2.5 (*) 3.5 - 5.2 g/dL   AST 36  0 - 37 U/Padilla   ALT 36  0 - 53 U/Padilla   Alkaline Phosphatase 75  39 - 117 U/Padilla  Total Bilirubin 0.6  0.3 - 1.2 mg/dL   GFR calc non Af Amer 86 (*) >90 mL/min   GFR calc Af Amer >90  >90 mL/min   Comment: (NOTE)     The eGFR has been calculated using the CKD EPI equation.     This calculation has not been validated in all clinical situations.     eGFR's persistently <90 mL/min signify possible Chronic Kidney     Disease.   Anion gap 13  5 - 15  CBC     Status: Abnormal   Collection Time    11/04/13  5:09 AM      Result Value Ref Range   WBC 7.9  4.0 - 10.5 K/uL   RBC 4.17 (*) 4.22 - 5.81 MIL/uL   Hemoglobin 12.8 (*) 13.0 - 17.0 g/dL   HCT 38.2 (*) 39.0 - 52.0 %   MCV 91.6  78.0 - 100.0  fL   MCH 30.7  26.0 - 34.0 pg   MCHC 33.5  30.0 - 36.0 g/dL   RDW 13.7  11.5 - 15.5 %   Platelets 107 (*) 150 - 400 K/uL   Comment: PLATELET COUNT CONFIRMED BY SMEAR     SPECIMEN CHECKED FOR CLOTS  PROTIME-INR     Status: Abnormal   Collection Time    11/04/13  5:09 AM      Result Value Ref Range   Prothrombin Time 15.6 (*) 11.6 - 15.2 seconds   INR 1.24  0.00 - 1.49    ABGS No results found for this basename: PHART, PCO2, PO2ART, TCO2, HCO3,  in the last 72 hours CULTURES Recent Results (from the past 240 hour(s))  CULTURE, BLOOD (ROUTINE X 2)     Status: None   Collection Time    11/03/13 10:29 AM      Result Value Ref Range Status   Specimen Description BLOOD LEFT ANTECUBITAL   Final   Special Requests BOTTLES DRAWN AEROBIC ONLY 8CC   Final   Culture NO GROWTH <24 HRS   Final   Report Status PENDING   Incomplete  CULTURE, BLOOD (ROUTINE X 2)     Status: None   Collection Time    11/03/13 10:30 AM      Result Value Ref Range Status   Specimen Description BLOOD RIGHT HAND DRAWN BY RN SN   Final   Special Requests BOTTLES DRAWN AEROBIC AND ANAEROBIC 10CC   Final   Culture NO GROWTH <24 HRS   Final   Report Status PENDING   Incomplete   Studies/Results: No results found.  Medications:  Prior to Admission:  Prescriptions prior to admission  Medication Sig Dispense Refill  . amLODipine (NORVASC) 5 MG tablet Take 5 mg by mouth daily.      . ciprofloxacin (CIPRO) 250 MG tablet Take 250 mg by mouth 2 (two) times daily.      . Dapagliflozin Propanediol (FARXIGA) 5 MG TABS Take 1 tablet by mouth daily.      . furosemide (LASIX) 40 MG tablet Take 1 tablet (40 mg total) by mouth daily.  30 tablet  6  . ibuprofen (ADVIL,MOTRIN) 200 MG tablet Take 400 mg by mouth every 6 (six) hours as needed for moderate pain.      Marland Kitchen irbesartan (AVAPRO) 300 MG tablet Take 300 mg by mouth daily.      Marland Kitchen lubiprostone (AMITIZA) 24 MCG capsule Take 24 mcg by mouth daily.      . nitroGLYCERIN  (NITROSTAT) 0.4  MG SL tablet Place 1 tablet (0.4 mg total) under the tongue every 5 (five) minutes as needed for chest pain.  25 tablet  11  . potassium chloride (K-DUR,KLOR-CON) 20 MEQ tablet Take 1 tablet (20 mEq total) by mouth daily.  30 tablet  6  . pregabalin (LYRICA) 75 MG capsule Take 75 mg by mouth 3 (three) times daily.      . Tamsulosin HCl (FLOMAX) 0.4 MG CAPS Take 0.4 mg by mouth daily after supper.        . zolpidem (AMBIEN) 10 MG tablet Take 10 mg by mouth at bedtime as needed for sleep.       Scheduled: . heparin  5,000 Units Subcutaneous 3 times per day  . piperacillin-tazobactam (ZOSYN)  IV  3.375 g Intravenous Q8H  . potassium chloride  20 mEq Oral Daily  . pregabalin  75 mg Oral TID  . sodium chloride  3 mL Intravenous Q12H  . tamsulosin  0.4 mg Oral QPC supper   Continuous:  GAY:[id number], zolpidem  Assesment: He was admitted with sepsis which I think is probably from his urinary tract but he had intensive GI symptomatology as well. I don't see results from a chest x-ray so I will order that. He does seem better. He is still febrile however. Principal Problem:   Sepsis-unclear source Active Problems:   HYPERTENSION   Laryngeal carcinoma   Obesity   Sleep apnea   Pulmonary infiltrate   Abdominal aneurysm without mention of rupture   Popliteal aneurysm   S/P bypass graft of extremity   History of laparoscopic adjustable gastric banding, APL, 12/19/2008.    Plan: Continue current treatments. He will have abdominal CT. He will have a chest x-ray. I'm going to go ahead and request GI consultation. Continue current antibiotics    LOS: 1 day   Mathew Padilla 11/04/2013, 8:23 AM

## 2013-11-04 NOTE — Progress Notes (Signed)
Inpatient Diabetes Program Recommendations  AACE/ADA: New Consensus Statement on Inpatient Glycemic Control (2013)  Target Ranges:  Prepandial:   less than 140 mg/dL      Peak postprandial:   less than 180 mg/dL (1-2 hours)      Critically ill patients:  140 - 180 mg/dL   Results for Mathew Padilla, Mathew Padilla (MRN [id number]) as of 11/04/2013 08:41  Ref. Range 11/03/2013 10:29 11/04/2013 05:09  Glucose Latest Range: 70-99 mg/dL 206 (H) 180 (H)   Diabetes history: DM2 Outpatient Diabetes medications: Farxiga 5 mg daily Current orders for Inpatient glycemic control: None  Inpatient Diabetes Program Recommendations Correction (SSI): Please order CBGs with Novolog correction scale ACHS. HgbA1C: Please consider ordering an A1C to evaluate glycemic control over the past 2-3 months.  Thanks, Mathew Alderman, RN, MSN, CCRN Diabetes Coordinator Inpatient Diabetes Program [phone] (Team Pager) [phone] ([facility] office) [phone] [facility] office)

## 2013-11-04 NOTE — Consult Note (Signed)
Referring Provider: Alonza Bogus, MD Primary Care Physician:  Alonza Bogus, MD Primary Gastroenterologist:  Garfield Cornea, MD   Reason for Consultation:  Diarrhea, fever  HPI: Mathew Padilla is a 63 y.o. male admitted with fever, nausea/vomiting/diarrhea. Patient with history of lap band 2010, throat cancer status post resection and XRT, sleep apnea, CAD, peripheral artery disease, AAA with stent graft, left to right femoropopliteal bypass, right femoropopliteal also, left above-the-knee to below the knee popliteal graft. Presented with 4 to five-day history of intermittent fever 103 associated with vomiting and diarrhea. Self medicated with ibuprofen, Imodium, Pepto-Bismol. Last bowel movement 2 days ago. Came to the ER due to fever and inability to eat. Complains of diffuse abdominal pain. He notes that he retched several times but no emesis due to history of lap band. Also with increased urination. Nonproductive cough.   Last bowel movement small, mucous. No blood in the stool or melena.  Ostomy July 2009 by Dr. Gala Romney for symptoms of early satiety, abdominal bloating, halitosis, weight gain. He had distal esophageal erosions, patulous GE junction, erosive reflux esophagitis, hiatal hernia, lipoma on the angularis not manipulated, left-sided diverticula. He denies any GERD, swallowing problems.   Prior to Admission medications   Medication Sig Start Date End Date Taking? Authorizing Provider  amLODipine (NORVASC) 5 MG tablet Take 5 mg by mouth daily.   Yes Historical Provider, MD  ciprofloxacin (CIPRO) 250 MG tablet Take 250 mg by mouth 2 (two) times daily.   Yes Historical Provider, MD  Dapagliflozin Propanediol (FARXIGA) 5 MG TABS Take 1 tablet by mouth daily.   Yes Historical Provider, MD  furosemide (LASIX) 40 MG tablet Take 1 tablet (40 mg total) by mouth daily. 10/25/12  Yes Burnell Blanks, MD  ibuprofen (ADVIL,MOTRIN) 200 MG tablet Take 400 mg by mouth every 6 (six) hours  as needed for moderate pain.   Yes Historical Provider, MD  irbesartan (AVAPRO) 300 MG tablet Take 300 mg by mouth daily.   Yes Historical Provider, MD  lubiprostone (AMITIZA) 24 MCG capsule Take 24 mcg by mouth daily.   Yes Historical Provider, MD  nitroGLYCERIN (NITROSTAT) 0.4 MG SL tablet Place 1 tablet (0.4 mg total) under the tongue every 5 (five) minutes as needed for chest pain. 04/01/12  Yes Burnell Blanks, MD  potassium chloride (K-DUR,KLOR-CON) 20 MEQ tablet Take 1 tablet (20 mEq total) by mouth daily. 10/25/12  Yes Burnell Blanks, MD  pregabalin (LYRICA) 75 MG capsule Take 75 mg by mouth 3 (three) times daily.   Yes Historical Provider, MD  Tamsulosin HCl (FLOMAX) 0.4 MG CAPS Take 0.4 mg by mouth daily after supper.   04/21/11  Yes Regina J Roczniak, PA-C  zolpidem (AMBIEN) 10 MG tablet Take 10 mg by mouth at bedtime as needed for sleep.   Yes Historical Provider, MD    Current Facility-Administered Medications  Medication Dose Route Frequency Provider Last Rate Last Dose  . acetaminophen (TYLENOL) tablet 650 mg  650 mg Oral Q4H PRN Dianne Dun, NP   650 mg at 11/03/13 2115  . heparin injection 5,000 Units  5,000 Units Subcutaneous 3 times per day Nita Sells, MD   5,000 Units at 11/04/13 1359  . piperacillin-tazobactam (ZOSYN) IVPB 3.375 g  3.375 g Intravenous Q8H Nita Sells, MD   3.375 g at 11/04/13 1359  . potassium chloride SA (K-DUR,KLOR-CON) CR tablet 20 mEq  20 mEq Oral Daily Nita Sells, MD   20 mEq at 11/04/13 0900  .  pregabalin (LYRICA) capsule 75 mg  75 mg Oral TID Nita Sells, MD   75 mg at 11/04/13 0900  . sodium chloride 0.9 % injection 3 mL  3 mL Intravenous Q12H Nita Sells, MD   3 mL at 11/03/13 2200  . tamsulosin (FLOMAX) capsule 0.4 mg  0.4 mg Oral QPC supper Nita Sells, MD   0.4 mg at 11/03/13 1729  . zolpidem (AMBIEN) tablet 10 mg  10 mg Oral QHS PRN Nita Sells, MD         Allergies as of 11/03/2013 - Review Complete 11/03/2013  Allergen Reaction Noted  . Aspirin  06/05/2011  . Statins Hives and Rash 11/21/2009    Past Medical History  Diagnosis Date  . Arteriosclerotic cardiovascular disease (ASCVD) 2000 he    MI in 1999; PTCA of PDA and 2nd marginal in 3/00  . AAA (abdominal aortic aneurysm) 2007    stent graft repair 12/07  . Laryngeal carcinoma 2010    resection and radiation therapy 2010-11  . Diverticulitis 2006  . Obesity     BMI 47; lap band surgery 2010  . Gastroesophageal reflux disease     Hiatal hernia; previously treated with PPI  . Tobacco abuse, in remission     40 pack years; discontinued in 2000  . Cerebrovascular disease   . Cholelithiasis 11/28/2010  . Elevated PSA 06/2011  . Sleep apnea     BiPAP mask utilized        2 yrs sleep study    dr Redmond Pulling  . Hypertension     Mild; controlled with a single agent       dr Lattie Haw      cardiac md  . Cough, persistent   . 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 Peripheral arterial stent graft  2007    for abdominal aortic aneurysm  . Larynx surgery  2010    Resection of carcinoma  . Open anterior shoulder reconstruction  2011    Left  . Laparoscopic gastric banding  2010  . Eye surgery    . Cataract extraction    . Colonoscopy  10/2007  . Femoral-femoral bypass graft  04/18/2011    Procedure: BYPASS GRAFT FEMORAL-FEMORAL ARTERY;  Surgeon: Theotis Burrow, MD;  Location: [facility] OR;  Service: Vascular;  Laterality: Bilateral;  Left to right femoral to femoral bypass   . Femoral-popliteal bypass graft  04/18/2011    Procedure: BYPASS GRAFT FEMORAL-POPLITEAL ARTERY;  Surgeon: Theotis Burrow, MD;  Location: [facility] OR;  Service: Vascular;  Laterality: Right;  Right femoral popliteal bypass with propaten gortex graft  . Cardiac catheterization  2000    PCI-stent  . Pr vein bypass graft,aorto-fem-pop  07-23-11    Left AK to BK  popliteal BPG using rev. saphenous vein  . Colonoscopy with esophagogastroduodenoscopy (egd)  2009    RMR: He had distal esophageal erosions, patulous GE junction, erosive reflux esophagitis, hiatal hernia, lipoma on the angularis not manipulated, left-sided diverticula.    Family History  Problem Relation Age of Onset  . Heart attack Father     deceased  . Heart failure Father   . Hyperlipidemia Father   . Hypertension Father   . Heart disease Father   . Lung cancer Mother     deceased   . Hypertension Mother   . Hyperlipidemia Mother   . Heart failure Mother   . Diabetes Mother   .  Cancer Sister     vaginal  . Diabetes Sister   . Colon cancer Neg Hx     History   Social History  . Marital Status: Married    Spouse Name: N/A    Number of Children: 3  . Years of Education: N/A   Occupational History  . Retired, part Doctor, general practice    Social History Main Topics  . Smoking status: Former Smoker -- 2.00 packs/day for 30 years    Types: Cigarettes    Quit date: 11/26/1997  . Smokeless tobacco: Never Used     Comment: 40 pack years; quit 2000  . Alcohol Use: No  . Drug Use: No  . Sexual Activity: Yes   Other Topics Concern  . Not on file   Social History Narrative   Married, past employed, does not get regular exercise.      ROS:  General:see hpi Eyes: Negative for vision changes.  ENT: Negative for hoarseness, difficulty swallowing , nasal congestion. CV: Negative for chest pain, angina, palpitations, dyspnea on exertion, peripheral edema.  Respiratory: Negative for dyspnea at rest, dyspnea on exertion, sputum, wheezing. +cough GI: See history of present illness. GU:  Negative for dysuria, hematuria, urinary incontinence,  +urinary frequency/nocturnal urination MS: Negative for joint pain, low back pain.  Derm: Negative for rash or itching.  Neuro: Negative for weakness, abnormal sensation, seizure, frequent headaches, memory loss, confusion.  Psych: Negative  for anxiety, depression, suicidal ideation, hallucinations.  Endo: Negative for unusual weight change.  Heme: Negative for bruising or bleeding. Allergy: Negative for rash or hives.       Physical Examination: Vital signs in last 24 hours: Temp:  [97.9 F (36.6 C)-100.3 F (37.9 C)] 99 F (37.2 C) (07/10 0653) Pulse Rate:  [80-92] 92 (07/10 0653) Resp:  [20] 20 (07/10 0653) BP: (95-119)/(41-73) 95/41 mmHg (07/10 0653) SpO2:  [95 %-98 %] 97 % (07/10 0653) Weight:  [339 lb (153.769 kg)] 339 lb (153.769 kg) (07/09 1453) Last BM Date: 11/02/13  General: chronically ill-appearing obese male, NAD. Head: Normocephalic, atraumatic.   Eyes: Conjunctiva pink, no icterus. Mouth: Oropharyngeal mucosa moist and pink , no lesions erythema or exudate. Neck: Supple without thyromegaly, masses, or lymphadenopathy.  Lungs: Clear to auscultation bilaterally.  Heart: Regular rate and rhythm, no murmurs rubs or gallops.  Abdomen: Bowel sounds are normal, mild diffuse tenderness, lap band port in right mid-abd, abd slightly more protruberant on right difficult exam due to body habitus, ?hepatomegaly, nondistended, no splenomegaly or masses, no abdominal bruits or    hernia , no rebound or guarding.   Rectal: not performed Extremities: No lower extremity edema, clubbing, deformity.  Neuro: Alert and oriented x 4 , grossly normal neurologically.  Skin: Warm and dry, no rash or jaundice.   Psych: Alert and cooperative, normal mood and affect.        Intake/Output from previous day: 07/09 0701 - 07/10 0700 In: 840 [P.O.:840] Out: 1625 [Urine:1625] Intake/Output this shift: Total I/O In: 480 [P.O.:480] Out: 400 [Urine:400]  Lab Results: CBC  Recent Labs  11/03/13 1029 11/04/13 0509  WBC 13.1* 7.9  HGB 14.2 12.8*  HCT 40.8 38.2*  MCV 89.9 91.6  PLT 112* 107*   BMET  Recent Labs  11/03/13 1029 11/04/13 0509  NA 129* 137  K 3.6* 4.0  CL 91* 99  CO2 22 25  GLUCOSE 206* 180*  BUN 17  14  CREATININE 0.97 0.94  CALCIUM 8.4 8.0*   LFT  Recent  Labs  11/03/13 1029 11/04/13 0509  BILITOT 0.7 0.6  ALKPHOS 93 75  AST 21 36  ALT 21 36  PROT 7.1 6.7  ALBUMIN 2.8* 2.5*    Lipase No results found for this basename: LIPASE,  in the last 72 hours  PT/INR  Recent Labs  11/04/13 0509  LABPROT 15.6*  INR 1.24   Urinalysis with positive nitrite, LE, large Hgb, TNTC WBC, RBC, many bacteria Urine culture: >100,000 E.coli Blood cultures X 2: negative at 2 days   Imaging Studies: CT A/P with contrast 11/04/13 CLINICAL DATA: Fever, nausea, and diarrhea ; history of throat  carcinoma  EXAM:  CT ABDOMEN AND PELVIS WITH CONTRAST  TECHNIQUE:  Multidetector CT imaging of the abdomen and pelvis was performed  using the standard protocol following bolus administration of  intravenous contrast.  CONTRAST: 106mL OMNIPAQUE IOHEXOL 300 MG/ML SOLN, 178mL OMNIPAQUE  IOHEXOL 300 MG/ML SOLN  COMPARISON: November 06, 2011  FINDINGS:  On axial slice 6 series 8, there is a 1.0 by 0.7 cm nodular opacity  in the superior segment of the left lower lobe. On axial slice 7  series 8, there is a 7 x 7 mm nodular opacity in the superior  segment of the left lower lobe. On axial slice 5 series 8, there is  a 5 x 4 mm nodular opacity in medial segment of the right middle  lobe. On axial slice 10 series 8, there is a 9 x 5 mm nodular  opacity in the superior segment of the right lower lobe. There is  mild interstitial fibrosis in each lung base. There is coronary  artery calcification at multiple sites. Pericardium is not  thickened.  Liver is enlarged, measuring 25.5 cm in length. There is diffuse  fatty change in the liver. No focal liver lesions are identified.  There is cholelithiasis. Gallbladder wall is not appreciably  thickened. There is no biliary duct dilatation.  There is a lap band positioned at the gastric cardia.  Spleen contains a small calcified granuloma. Spleen otherwise   appears unremarkable. Pancreas and adrenals appear normal. Kidneys  bilaterally show no hydronephrosis on either side. There is a 1.2 x  1.1 cm cyst in the periphery of the left mid kidney. No other renal  masses are identified. There is no renal or ureteral calculus on  either side. There is an aortic stent graft in place. There is mild  dilatation of the distal aorta currently to a maximum transverse  diameter of 3.4 x 3.5 cm. There is no periaortic fluid. The  occlusion of the right iliac limb noted previously is less dramatic  at this time.  In the pelvis, the prostate is diffusely enlarged and contains  multiple calcifications. Mild urinary bladder wall thickening may be  indicative of cystitis. Note that is difficult to separate the  prostate from the inferior aspect of the urinary bladder. The  elsewhere in the pelvis, there is no mass or fluid.  Appendix appears normal.  There is no bowel obstruction. No free air or portal venous air.  There is no ascites, adenopathy, or abscess in the abdomen or  pelvis. There is extensive degenerative type change at L2-3. There  has been progression of disc degeneration at this level compared to  the prior study. There is less pronounced degenerative change at  other lumbar levels. There are no blastic or lytic bone lesions.  IMPRESSION:  There are several small nodular lesions in the lung bases,  concerning for  metastatic disease given the history of throat  carcinoma.  There is no bowel obstruction. No abscess. Appendix appears normal.  Extensive disc degeneration at L2-3, progressed from prior study.  No renal or ureteral calculus. No hydronephrosis.  Enlargement of the prostate with lack of clear definition between  the inferior urinary bladder wall inferiorly and prostate.  Evaluation to assess for possible prostate carcinoma invading the  base of the bladder would be advised. There is wall thickening in  the prostate diffusely which  may indicate a degree of cystitis.  Question a degree of bladder outlet obstruction given the size of  the prostate and the degree of impression on the urinary bladder.  Cholelithiasis.  Aortic stent graft with questionable occlusion of the right iliac  lumen. Aneurysm has not progressed from prior study.  Lap band at gastric cardia.  Extensive coronary artery calcification.  These results will be called to the ordering clinician or  representative by the Radiologist Assistant, and communication  documented in the PACS or zVision Dashboard.  Electronically Signed  By: Lowella Grip M.D.  On: 11/04/2013 12:19     Impression: 63 y/o male with extensive PMH as outlined above who presents with fever, N/V/D. Clinically with UTI/E.coli on cultures likely explains fever and anorexia/nausea. Currently on Zosyn. Diarrhea seems to have resolved. Agree with checking GI pathogen panel if specimen available. No evidence of overt colitis on CT.   Hepatomegaly on CT. Likely fatty liver. LFTs normal.   He incidentally was found to have several lesions in the lungs worrisome for metastatic disease with h/o throat cancer. Also findings suggesting need to rule out prostate cancer. Further management per attending.   Plan: 1. Supportive measures.  2. GI pathogen panel if recurrent diarrhea.  3. Further work up of lung lesions and prostate per attending. 4. Will follow with you.  We would like to thank you for the opportunity to participate in the care of Mathew Padilla.    LOS: 1 day   Neil Crouch  11/04/2013, 2:03 PM

## 2013-11-04 NOTE — Care Management Utilization Note (Signed)
UR completed 

## 2013-11-04 NOTE — Progress Notes (Signed)
Mathew Padilla had CT Scan today at 11: 50 am  He was instructed not to take any Metformin for 48 hours per protocol instructions. He was given a copy of the Metformin instructions

## 2013-11-04 NOTE — Consult Note (Signed)
REVIEWED. ACUTE NAUSEA, VOMITING, AND DIARRHEA LIKELY DUE TO UTI. CT SHOWS NO ACUTE PATHOLOGY. SUPPORTIVE CARE.

## 2013-11-05 ENCOUNTER — Inpatient Hospital Stay (HOSPITAL_COMMUNITY): Payer: Medicare Other

## 2013-11-05 DIAGNOSIS — N400 Enlarged prostate without lower urinary tract symptoms: Secondary | ICD-10-CM | POA: Diagnosis present

## 2013-11-05 DIAGNOSIS — R918 Other nonspecific abnormal finding of lung field: Secondary | ICD-10-CM | POA: Diagnosis present

## 2013-11-05 DIAGNOSIS — N390 Urinary tract infection, site not specified: Secondary | ICD-10-CM | POA: Diagnosis present

## 2013-11-05 LAB — BASIC METABOLIC PANEL
Anion gap: 11 (ref 5–15)
BUN: 12 mg/dL (ref 6–23)
CO2: 26 mEq/L (ref 19–32)
CREATININE: 0.98 mg/dL (ref 0.50–1.35)
Calcium: 8.1 mg/dL — ABNORMAL LOW (ref 8.4–10.5)
Chloride: 99 mEq/L (ref 96–112)
GFR calc Af Amer: >90 mL/min (ref >90)
GFR, EST NON AFRICAN AMERICAN: 85 mL/min — AB (ref >90)
Glucose, Bld: 171 mg/dL — ABNORMAL HIGH (ref 70–99)
Potassium: 3.6 mEq/L — ABNORMAL LOW (ref 3.7–5.3)
Sodium: 136 mEq/L — ABNORMAL LOW (ref 137–147)

## 2013-11-05 LAB — CBC WITH DIFFERENTIAL/PLATELET
BASOS PCT: 1 % (ref 0–1)
Basophils Absolute: 0 10*3/uL (ref 0.0–0.1)
Eosinophils Absolute: 0.1 10*3/uL (ref 0.0–0.7)
Eosinophils Relative: 2 % (ref 0–5)
HEMATOCRIT: 38 % — AB (ref 39.0–52.0)
HEMOGLOBIN: 12.9 g/dL — AB (ref 13.0–17.0)
LYMPHS ABS: 0.8 10*3/uL (ref 0.7–4.0)
Lymphocytes Relative: 12 % (ref 12–46)
MCH: 30.9 pg (ref 26.0–34.0)
MCHC: 33.9 g/dL (ref 30.0–36.0)
MCV: 91.1 fL (ref 78.0–100.0)
MONO ABS: 1.3 10*3/uL — AB (ref 0.1–1.0)
MONOS PCT: 19 % — AB (ref 3–12)
Neutro Abs: 4.4 10*3/uL (ref 1.7–7.7)
Neutrophils Relative %: 66 % (ref 43–77)
Platelets: 119 10*3/uL — ABNORMAL LOW (ref 150–400)
RBC: 4.17 MIL/uL — AB (ref 4.22–5.81)
RDW: 13.9 % (ref 11.5–15.5)
WBC: 6.6 10*3/uL (ref 4.0–10.5)

## 2013-11-05 LAB — URINE CULTURE

## 2013-11-05 MED ORDER — SULFAMETHOXAZOLE-TMP DS 800-160 MG PO TABS: 1.0000 | ORAL_TABLET | Freq: Two times a day (BID) | ORAL | Status: DC

## 2013-11-05 MED ORDER — IOHEXOL 300 MG/ML  SOLN
80.0000 mL | Freq: Once | INTRAMUSCULAR | Status: AC | PRN
  Administered 2013-11-05: 80 mL via INTRAVENOUS

## 2013-11-05 NOTE — Progress Notes (Signed)
Dc instructions reviewed with patient and wife.  Verbalized understanding.  Pt dc'd to home with wife.  Schonewitz, Mathew Padilla 11/05/2013

## 2013-11-05 NOTE — Discharge Summary (Signed)
Physician Discharge Summary  Patient ID: Mathew Padilla MRN: [id number] DOB/AGE: Jan 29, 1951 63 y.o. Primary Care Physician:Sadye Kiernan L, MD Admit date: 11/03/2013 Discharge date: 11/05/2013    Discharge Diagnoses:   Principal Problem:   Sepsis-unclear source Active Problems:   HYPERTENSION   Arteriosclerotic cardiovascular disease (ASCVD)   Laryngeal carcinoma   AAA (abdominal aortic aneurysm)   Obesity   Sleep apnea   Pulmonary infiltrate   Abdominal aneurysm without mention of rupture   Popliteal aneurysm   S/P bypass graft of extremity   History of laparoscopic adjustable gastric banding, APL, 12/19/2008.   Pulmonary nodules/lesions, multiple   Prostate enlargement   Infection of urinary tract  sepsis from urinary tract infection    Medication List         amLODipine 5 MG tablet  Commonly known as:  NORVASC  Take 5 mg by mouth daily.     ciprofloxacin 250 MG tablet  Commonly known as:  CIPRO  Take 250 mg by mouth 2 (two) times daily.     FARXIGA 5 MG Tabs  Generic drug:  Dapagliflozin Propanediol  Take 1 tablet by mouth daily.     furosemide 40 MG tablet  Commonly known as:  LASIX  Take 1 tablet (40 mg total) by mouth daily.     ibuprofen 200 MG tablet  Commonly known as:  ADVIL,MOTRIN  Take 400 mg by mouth every 6 (six) hours as needed for moderate pain.     irbesartan 300 MG tablet  Commonly known as:  AVAPRO  Take 300 mg by mouth daily.     lubiprostone 24 MCG capsule  Commonly known as:  AMITIZA  Take 24 mcg by mouth daily.     nitroGLYCERIN 0.4 MG SL tablet  Commonly known as:  NITROSTAT  Place 1 tablet (0.4 mg total) under the tongue every 5 (five) minutes as needed for chest pain.     potassium chloride SA 20 MEQ tablet  Commonly known as:  K-DUR,KLOR-CON  Take 1 tablet (20 mEq total) by mouth daily.     pregabalin 75 MG capsule  Commonly known as:  LYRICA  Take 75 mg by mouth 3 (three) times daily.     sulfamethoxazole-trimethoprim  800-160 MG per tablet  Commonly known as:  BACTRIM DS  Take 1 tablet by mouth 2 (two) times daily.     tamsulosin 0.4 MG Caps capsule  Commonly known as:  FLOMAX  Take 0.4 mg by mouth daily after supper.     zolpidem 10 MG tablet  Commonly known as:  AMBIEN  Take 10 mg by mouth at bedtime as needed for sleep.        Discharged Condition: Improved    Consults: GI  Significant Diagnostic Studies: Dg Chest 2 View  11/04/2013   CLINICAL DATA:  Fever  EXAM: CHEST  2 VIEW  COMPARISON:  January 05, 2012  FINDINGS: The interstitium is mildly prominent in a generalized manner, a stable finding. There is no frank edema or consolidation. Heart is upper normal in size with normal pulmonary vascularity. No adenopathy. There is degenerative change in the thoracic spine.  IMPRESSION: Diffuse interstitial prominence, most likely reflective of chronic inflammatory type change, stable. No frank edema or consolidation. No appreciable change from prior study.   Electronically Signed   By: Lowella Grip M.D.   On: 11/04/2013 12:38   Ct Abdomen Pelvis W Contrast  11/04/2013   CLINICAL DATA:  Fever, nausea, and diarrhea ; history of throat carcinoma  EXAM: CT ABDOMEN AND PELVIS WITH CONTRAST  TECHNIQUE: Multidetector CT imaging of the abdomen and pelvis was performed using the standard protocol following bolus administration of intravenous contrast.  CONTRAST:  91mL OMNIPAQUE IOHEXOL 300 MG/ML SOLN, 171mL OMNIPAQUE IOHEXOL 300 MG/ML SOLN  COMPARISON:  November 06, 2011  FINDINGS: On axial slice 6 series 8, there is a 1.0 by 0.7 cm nodular opacity in the superior segment of the left lower lobe. On axial slice 7 series 8, there is a 7 x 7 mm nodular opacity in the superior segment of the left lower lobe. On axial slice 5 series 8, there is a 5 x 4 mm nodular opacity in medial segment of the right middle lobe. On axial slice 10 series 8, there is a 9 x 5 mm nodular opacity in the superior segment of the right lower  lobe. There is mild interstitial fibrosis in each lung base. There is coronary artery calcification at multiple sites. Pericardium is not thickened.  Liver is enlarged, measuring 25.5 cm in length. There is diffuse fatty change in the liver. No focal liver lesions are identified. There is cholelithiasis. Gallbladder wall is not appreciably thickened. There is no biliary duct dilatation.  There is a lap band positioned at the gastric cardia.  Spleen contains a small calcified granuloma. Spleen otherwise appears unremarkable. Pancreas and adrenals appear normal. Kidneys bilaterally show no hydronephrosis on either side. There is a 1.2 x 1.1 cm cyst in the periphery of the left mid kidney. No other renal masses are identified. There is no renal or ureteral calculus on either side. There is an aortic stent graft in place. There is mild dilatation of the distal aorta currently to a maximum transverse diameter of 3.4 x 3.5 cm. There is no periaortic fluid. The occlusion of the right iliac limb noted previously is less dramatic at this time.  In the pelvis, the prostate is diffusely enlarged and contains multiple calcifications. Mild urinary bladder wall thickening may be indicative of cystitis. Note that is difficult to separate the prostate from the inferior aspect of the urinary bladder. The elsewhere in the pelvis, there is no mass or fluid.  Appendix appears normal.  There is no bowel obstruction.  No free air or portal venous air.  There is no ascites, adenopathy, or abscess in the abdomen or pelvis. There is extensive degenerative type change at L2-3. There has been progression of disc degeneration at this level compared to the prior study. There is less pronounced degenerative change at other lumbar levels. There are no blastic or lytic bone lesions.  IMPRESSION: There are several small nodular lesions in the lung bases, concerning for metastatic disease given the history of throat carcinoma.  There is no bowel  obstruction. No abscess. Appendix appears normal.  Extensive disc degeneration at L2-3, progressed from prior study.  No renal or ureteral calculus.  No hydronephrosis.  Enlargement of the prostate with lack of clear definition between the inferior urinary bladder wall inferiorly and prostate. Evaluation to assess for possible prostate carcinoma invading the base of the bladder would be advised. There is wall thickening in the prostate diffusely which may indicate a degree of cystitis. Question a degree of bladder outlet obstruction given the size of the prostate and the degree of impression on the urinary bladder.  Cholelithiasis.  Aortic stent graft with questionable occlusion of the right iliac lumen. Aneurysm has not progressed from prior study.  Lap band at gastric cardia.  Extensive coronary artery calcification.  These results will be called to the ordering clinician or representative by the Radiologist Assistant, and communication documented in the PACS or zVision Dashboard.   Electronically Signed   By: Lowella Grip M.D.   On: 11/04/2013 12:19    Lab Results: Basic Metabolic Panel:  Recent Labs  11/04/13 0509 11/05/13 0627  NA 137 136*  K 4.0 3.6*  CL 99 99  CO2 25 26  GLUCOSE 180* 171*  BUN 14 12  CREATININE 0.94 0.98  CALCIUM 8.0* 8.1*   Liver Function Tests:  Recent Labs  11/03/13 1029 11/04/13 0509  AST 21 36  ALT 21 36  ALKPHOS 93 75  BILITOT 0.7 0.6  PROT 7.1 6.7  ALBUMIN 2.8* 2.5*     CBC:  Recent Labs  11/03/13 1029 11/04/13 0509 11/05/13 0627  WBC 13.1* 7.9 6.6  NEUTROABS 11.5*  --  4.4  HGB 14.2 12.8* 12.9*  HCT 40.8 38.2* 38.0*  MCV 89.9 91.6 91.1  PLT 112* 107* 119*    Recent Results (from the past 240 hour(s))  URINE CULTURE     Status: None   Collection Time    11/03/13 10:21 AM      Result Value Ref Range Status   Specimen Description URINE, CLEAN CATCH   Final   Special Requests NONE   Final   Culture  Setup Time     Final    Value: 11/03/2013 14:23     Performed at [facility] Count     Final   Value: >=100,000 COLONIES/ML     Performed at [facility]   Culture     Final   Value: ESCHERICHIA COLI     Performed at [facility]   Report Status 11/05/2013 FINAL   Final   Organism ID, Bacteria ESCHERICHIA COLI   Final  CULTURE, BLOOD (ROUTINE X 2)     Status: None   Collection Time    11/03/13 10:29 AM      Result Value Ref Range Status   Specimen Description BLOOD LEFT ANTECUBITAL   Final   Special Requests BOTTLES DRAWN AEROBIC ONLY 8CC   Final   Culture NO GROWTH 2 DAYS   Final   Report Status PENDING   Incomplete  CULTURE, BLOOD (ROUTINE X 2)     Status: None   Collection Time    11/03/13 10:30 AM      Result Value Ref Range Status   Specimen Description BLOOD RIGHT HAND DRAWN BY RN SN   Final   Special Requests BOTTLES DRAWN AEROBIC AND ANAEROBIC 10CC   Final   Culture NO GROWTH 2 DAYS   Final   Report Status PENDING   Incomplete     Hospital Course: He was admitted with fever. His temperature was as high as 103. This had been going on for several days. He had nausea diarrhea and dysuria and frequency of urination. He was felt to be mildly septic on admission. His urine grew Escherichia coli. GI consultation was obtained because of concerns that his GI issues might have been related to his previous obesity surgery. CT of the abdomen and pelvis was obtained and this showed multiple abnormalities including pulmonary nodules in the bases of both lungs, enlarged prostate was some question of invasion of the bladder, abnormalities and a previous stent of an abdominal aortic aneurysm with a suggestion that the right iliac is occluded, and cholelithiasis. After approximately 48 hours in the hospital he felt  much better and requested discharge. He had CT of the chest which is pending at the time of this dictation. That is to see if he has other pulmonary nodules. He could  potentially have metastatic cancer from his previous laryngeal cancer, he could have a primary lung cancer or he could have metastatic prostate cancer if he does indeed have prostate cancer. He understands at this point there is diagnostic uncertainty and plan will be for him to have PET scanning as an outpatient.  Discharge Exam: Blood pressure 115/52, pulse 74, temperature 98.9 F (37.2 C), temperature source Oral, resp. rate 20, height 5\' 11"  (1.803 m), weight 153.769 kg (339 lb), SpO2 98.00%. He is awake and alert. He looks much more comfortable. He is afebrile. He is able to eat and keep food down.  Disposition: Home he will have early followup with PET scanning etc. and needs urology appointment      Discharge Instructions   Discharge patient    Complete by:  As directed   After CT chest completed             Signed: Warwick Nick L   11/05/2013, 10:28 AM

## 2013-11-05 NOTE — Progress Notes (Signed)
Subjective: He says he feels well and wants to go home. He's not had anymore GI symptoms. He still has dysuria. His CT of the abdomen and pelvis had a number of abnormalities including pulmonary nodule seen in the bases of both lungs, prostate enlargement with some question of whether his prostate has invaded the bladder, possible occlusion of the right iliac side of his aortic stent, gallstones, coronary calcifications.  Objective: Vital signs in last 24 hours: Temp:  [98.9 F (37.2 C)-99.2 F (37.3 C)] 98.9 F (37.2 C) (07/10 2212) Pulse Rate:  [74-82] 74 (07/10 2212) Resp:  [20] 20 (07/10 2212) BP: (115-129)/(52-60) 115/52 mmHg (07/10 2212) SpO2:  [95 %-98 %] 98 % (07/10 2212) Weight change:  Last BM Date: 11/02/13  Intake/Output from previous day: 07/10 0701 - 07/11 0700 In: 1110 [P.O.:960; IV Piggyback:150] Out: 2480 [Urine:2480]  PHYSICAL EXAM General appearance: alert, cooperative, moderate distress and morbidly obese Resp: clear to auscultation bilaterally Cardio: regular rate and rhythm, S1, S2 normal, no murmur, click, rub or gallop GI: soft, non-tender; bowel sounds normal; no masses,  no organomegaly Extremities: extremities normal, atraumatic, no cyanosis or edema  Lab Results:  Results for orders placed during the hospital encounter of 11/03/13 (from the past 48 hour(s))  URINALYSIS, ROUTINE W REFLEX MICROSCOPIC     Status: Abnormal   Collection Time    11/03/13 10:21 AM      Result Value Ref Range   Color, Urine YELLOW  YELLOW   APPearance CLEAR  CLEAR   Specific Gravity, Urine 1.015  1.005 - 1.030   pH 6.0  5.0 - 8.0   Glucose, UA 250 (*) NEGATIVE mg/dL   Hgb urine dipstick LARGE (*) NEGATIVE   Bilirubin Urine NEGATIVE  NEGATIVE   Ketones, ur 40 (*) NEGATIVE mg/dL   Protein, ur 100 (*) NEGATIVE mg/dL   Urobilinogen, UA 1.0  0.0 - 1.0 mg/dL   Nitrite POSITIVE (*) NEGATIVE   Leukocytes, UA MODERATE (*) NEGATIVE  URINE CULTURE     Status: None   Collection  Time    11/03/13 10:21 AM      Result Value Ref Range   Specimen Description URINE, CLEAN CATCH     Special Requests NONE     Culture  Setup Time       Value: 11/03/2013 14:23     Performed at [facility]       Value: >=100,000 COLONIES/ML     Performed at [facility]   Culture       Value: ESCHERICHIA COLI     Performed at [facility]   Report Status 11/05/2013 FINAL     Organism ID, Bacteria ESCHERICHIA COLI    URINE MICROSCOPIC-ADD ON     Status: Abnormal   Collection Time    11/03/13 10:21 AM      Result Value Ref Range   WBC, UA TOO NUMEROUS TO COUNT  <3 WBC/hpf   RBC / HPF TOO NUMEROUS TO COUNT  <3 RBC/hpf   Bacteria, UA MANY (*) RARE  COMPREHENSIVE METABOLIC PANEL     Status: Abnormal   Collection Time    11/03/13 10:29 AM      Result Value Ref Range   Sodium 129 (*) 137 - 147 mEq/L   Potassium 3.6 (*) 3.7 - 5.3 mEq/L   Chloride 91 (*) 96 - 112 mEq/L   CO2 22  19 - 32 mEq/L   Glucose, Bld 206 (*) 70 -  99 mg/dL   BUN 17  6 - 23 mg/dL   Creatinine, Ser 0.97  0.50 - 1.35 mg/dL   Calcium 8.4  8.4 - 10.5 mg/dL   Total Protein 7.1  6.0 - 8.3 g/dL   Albumin 2.8 (*) 3.5 - 5.2 g/dL   AST 21  0 - 37 U/L   ALT 21  0 - 53 U/L   Alkaline Phosphatase 93  39 - 117 U/L   Total Bilirubin 0.7  0.3 - 1.2 mg/dL   GFR calc non Af Amer 85 (*) >90 mL/min   GFR calc Af Amer >90  >90 mL/min   Comment: (NOTE)     The eGFR has been calculated using the CKD EPI equation.     This calculation has not been validated in all clinical situations.     eGFR's persistently <90 mL/min signify possible Chronic Kidney     Disease.   Anion gap 16 (*) 5 - 15  CBC WITH DIFFERENTIAL     Status: Abnormal   Collection Time    11/03/13 10:29 AM      Result Value Ref Range   WBC 13.1 (*) 4.0 - 10.5 K/uL   RBC 4.54  4.22 - 5.81 MIL/uL   Hemoglobin 14.2  13.0 - 17.0 g/dL   HCT 40.8  39.0 - 52.0 %   MCV 89.9  78.0 - 100.0 fL   MCH 31.3  26.0 - 34.0 pg   MCHC  34.8  30.0 - 36.0 g/dL   RDW 13.4  11.5 - 15.5 %   Platelets 112 (*) 150 - 400 K/uL   Comment: PLATELET COUNT CONFIRMED BY SMEAR     SPECIMEN CHECKED FOR CLOTS   Neutrophils Relative % 87 (*) 43 - 77 %   Neutro Abs 11.5 (*) 1.7 - 7.7 K/uL   Lymphocytes Relative 4 (*) 12 - 46 %   Lymphs Abs 0.6 (*) 0.7 - 4.0 K/uL   Monocytes Relative 8  3 - 12 %   Monocytes Absolute 1.1 (*) 0.1 - 1.0 K/uL   Eosinophils Relative 0  0 - 5 %   Eosinophils Absolute 0.0  0.0 - 0.7 K/uL   Basophils Relative 0  0 - 1 %   Basophils Absolute 0.0  0.0 - 0.1 K/uL  CULTURE, BLOOD (ROUTINE X 2)     Status: None   Collection Time    11/03/13 10:29 AM      Result Value Ref Range   Specimen Description BLOOD LEFT ANTECUBITAL     Special Requests BOTTLES DRAWN AEROBIC ONLY 8CC     Culture NO GROWTH 2 DAYS     Report Status PENDING    CULTURE, BLOOD (ROUTINE X 2)     Status: None   Collection Time    11/03/13 10:30 AM      Result Value Ref Range   Specimen Description BLOOD RIGHT HAND DRAWN BY RN SN     Special Requests BOTTLES DRAWN AEROBIC AND ANAEROBIC 10CC     Culture NO GROWTH 2 DAYS     Report Status PENDING    LACTIC ACID, PLASMA     Status: None   Collection Time    11/03/13  3:24 PM      Result Value Ref Range   Lactic Acid, Venous 1.9  0.5 - 2.2 mmol/L  COMPREHENSIVE METABOLIC PANEL     Status: Abnormal   Collection Time    11/04/13  5:09 AM  Result Value Ref Range   Sodium 137  137 - 147 mEq/L   Comment: DELTA CHECK NOTED   Potassium 4.0  3.7 - 5.3 mEq/L   Chloride 99  96 - 112 mEq/L   CO2 25  19 - 32 mEq/L   Glucose, Bld 180 (*) 70 - 99 mg/dL   BUN 14  6 - 23 mg/dL   Creatinine, Ser 0.94  0.50 - 1.35 mg/dL   Calcium 8.0 (*) 8.4 - 10.5 mg/dL   Total Protein 6.7  6.0 - 8.3 g/dL   Albumin 2.5 (*) 3.5 - 5.2 g/dL   AST 36  0 - 37 U/L   ALT 36  0 - 53 U/L   Alkaline Phosphatase 75  39 - 117 U/L   Total Bilirubin 0.6  0.3 - 1.2 mg/dL   GFR calc non Af Amer 86 (*) >90 mL/min   GFR calc Af  Amer >90  >90 mL/min   Comment: (NOTE)     The eGFR has been calculated using the CKD EPI equation.     This calculation has not been validated in all clinical situations.     eGFR's persistently <90 mL/min signify possible Chronic Kidney     Disease.   Anion gap 13  5 - 15  CBC     Status: Abnormal   Collection Time    11/04/13  5:09 AM      Result Value Ref Range   WBC 7.9  4.0 - 10.5 K/uL   RBC 4.17 (*) 4.22 - 5.81 MIL/uL   Hemoglobin 12.8 (*) 13.0 - 17.0 g/dL   HCT 38.2 (*) 39.0 - 52.0 %   MCV 91.6  78.0 - 100.0 fL   MCH 30.7  26.0 - 34.0 pg   MCHC 33.5  30.0 - 36.0 g/dL   RDW 13.7  11.5 - 15.5 %   Platelets 107 (*) 150 - 400 K/uL   Comment: PLATELET COUNT CONFIRMED BY SMEAR     SPECIMEN CHECKED FOR CLOTS  PROTIME-INR     Status: Abnormal   Collection Time    11/04/13  5:09 AM      Result Value Ref Range   Prothrombin Time 15.6 (*) 11.6 - 15.2 seconds   INR 1.24  0.00 - 1.49  CBC WITH DIFFERENTIAL     Status: Abnormal   Collection Time    11/05/13  6:27 AM      Result Value Ref Range   WBC 6.6  4.0 - 10.5 K/uL   RBC 4.17 (*) 4.22 - 5.81 MIL/uL   Hemoglobin 12.9 (*) 13.0 - 17.0 g/dL   HCT 38.0 (*) 39.0 - 52.0 %   MCV 91.1  78.0 - 100.0 fL   MCH 30.9  26.0 - 34.0 pg   MCHC 33.9  30.0 - 36.0 g/dL   RDW 13.9  11.5 - 15.5 %   Platelets 119 (*) 150 - 400 K/uL   Comment: SPECIMEN CHECKED FOR CLOTS     PLATELET COUNT CONFIRMED BY SMEAR   Neutrophils Relative % 66  43 - 77 %   Neutro Abs 4.4  1.7 - 7.7 K/uL   Lymphocytes Relative 12  12 - 46 %   Lymphs Abs 0.8  0.7 - 4.0 K/uL   Monocytes Relative 19 (*) 3 - 12 %   Monocytes Absolute 1.3 (*) 0.1 - 1.0 K/uL   Eosinophils Relative 2  0 - 5 %   Eosinophils Absolute 0.1  0.0 - 0.7 K/uL  Basophils Relative 1  0 - 1 %   Basophils Absolute 0.0  0.0 - 0.1 K/uL  BASIC METABOLIC PANEL     Status: Abnormal   Collection Time    11/05/13  6:27 AM      Result Value Ref Range   Sodium 136 (*) 137 - 147 mEq/L   Potassium 3.6 (*)  3.7 - 5.3 mEq/L   Chloride 99  96 - 112 mEq/L   CO2 26  19 - 32 mEq/L   Glucose, Bld 171 (*) 70 - 99 mg/dL   BUN 12  6 - 23 mg/dL   Creatinine, Ser 0.98  0.50 - 1.35 mg/dL   Calcium 8.1 (*) 8.4 - 10.5 mg/dL   GFR calc non Af Amer 85 (*) >90 mL/min   GFR calc Af Amer >90  >90 mL/min   Comment: (NOTE)     The eGFR has been calculated using the CKD EPI equation.     This calculation has not been validated in all clinical situations.     eGFR's persistently <90 mL/min signify possible Chronic Kidney     Disease.   Anion gap 11  5 - 15    ABGS No results found for this basename: PHART, PCO2, PO2ART, TCO2, HCO3,  in the last 72 hours CULTURES Recent Results (from the past 240 hour(s))  URINE CULTURE     Status: None   Collection Time    11/03/13 10:21 AM      Result Value Ref Range Status   Specimen Description URINE, CLEAN CATCH   Final   Special Requests NONE   Final   Culture  Setup Time     Final   Value: 11/03/2013 14:23     Performed at [facility]     Final   Value: >=100,000 COLONIES/ML     Performed at [facility]   Culture     Final   Value: ESCHERICHIA COLI     Performed at [facility]   Report Status 11/05/2013 FINAL   Final   Organism ID, Bacteria ESCHERICHIA COLI   Final  CULTURE, BLOOD (ROUTINE X 2)     Status: None   Collection Time    11/03/13 10:29 AM      Result Value Ref Range Status   Specimen Description BLOOD LEFT ANTECUBITAL   Final   Special Requests BOTTLES DRAWN AEROBIC ONLY 8CC   Final   Culture NO GROWTH 2 DAYS   Final   Report Status PENDING   Incomplete  CULTURE, BLOOD (ROUTINE X 2)     Status: None   Collection Time    11/03/13 10:30 AM      Result Value Ref Range Status   Specimen Description BLOOD RIGHT HAND DRAWN BY RN SN   Final   Special Requests BOTTLES DRAWN AEROBIC AND ANAEROBIC 10CC   Final   Culture NO GROWTH 2 DAYS   Final   Report Status PENDING   Incomplete   Studies/Results: Dg  Chest 2 View  11/04/2013   CLINICAL DATA:  Fever  EXAM: CHEST  2 VIEW  COMPARISON:  January 05, 2012  FINDINGS: The interstitium is mildly prominent in a generalized manner, a stable finding. There is no frank edema or consolidation. Heart is upper normal in size with normal pulmonary vascularity. No adenopathy. There is degenerative change in the thoracic spine.  IMPRESSION: Diffuse interstitial prominence, most likely reflective of chronic inflammatory type change, stable. No  frank edema or consolidation. No appreciable change from prior study.   Electronically Signed   By: Lowella Grip M.D.   On: 11/04/2013 12:38   Ct Abdomen Pelvis W Contrast  11/04/2013   CLINICAL DATA:  Fever, nausea, and diarrhea ; history of throat carcinoma  EXAM: CT ABDOMEN AND PELVIS WITH CONTRAST  TECHNIQUE: Multidetector CT imaging of the abdomen and pelvis was performed using the standard protocol following bolus administration of intravenous contrast.  CONTRAST:  53m OMNIPAQUE IOHEXOL 300 MG/ML SOLN, 102mOMNIPAQUE IOHEXOL 300 MG/ML SOLN  COMPARISON:  November 06, 2011  FINDINGS: On axial slice 6 series 8, there is a 1.0 by 0.7 cm nodular opacity in the superior segment of the left lower lobe. On axial slice 7 series 8, there is a 7 x 7 mm nodular opacity in the superior segment of the left lower lobe. On axial slice 5 series 8, there is a 5 x 4 mm nodular opacity in medial segment of the right middle lobe. On axial slice 10 series 8, there is a 9 x 5 mm nodular opacity in the superior segment of the right lower lobe. There is mild interstitial fibrosis in each lung base. There is coronary artery calcification at multiple sites. Pericardium is not thickened.  Liver is enlarged, measuring 25.5 cm in length. There is diffuse fatty change in the liver. No focal liver lesions are identified. There is cholelithiasis. Gallbladder wall is not appreciably thickened. There is no biliary duct dilatation.  There is a lap band positioned  at the gastric cardia.  Spleen contains a small calcified granuloma. Spleen otherwise appears unremarkable. Pancreas and adrenals appear normal. Kidneys bilaterally show no hydronephrosis on either side. There is a 1.2 x 1.1 cm cyst in the periphery of the left mid kidney. No other renal masses are identified. There is no renal or ureteral calculus on either side. There is an aortic stent graft in place. There is mild dilatation of the distal aorta currently to a maximum transverse diameter of 3.4 x 3.5 cm. There is no periaortic fluid. The occlusion of the right iliac limb noted previously is less dramatic at this time.  In the pelvis, the prostate is diffusely enlarged and contains multiple calcifications. Mild urinary bladder wall thickening may be indicative of cystitis. Note that is difficult to separate the prostate from the inferior aspect of the urinary bladder. The elsewhere in the pelvis, there is no mass or fluid.  Appendix appears normal.  There is no bowel obstruction.  No free air or portal venous air.  There is no ascites, adenopathy, or abscess in the abdomen or pelvis. There is extensive degenerative type change at L2-3. There has been progression of disc degeneration at this level compared to the prior study. There is less pronounced degenerative change at other lumbar levels. There are no blastic or lytic bone lesions.  IMPRESSION: There are several small nodular lesions in the lung bases, concerning for metastatic disease given the history of throat carcinoma.  There is no bowel obstruction. No abscess. Appendix appears normal.  Extensive disc degeneration at L2-3, progressed from prior study.  No renal or ureteral calculus.  No hydronephrosis.  Enlargement of the prostate with lack of clear definition between the inferior urinary bladder wall inferiorly and prostate. Evaluation to assess for possible prostate carcinoma invading the base of the bladder would be advised. There is wall thickening  in the prostate diffusely which may indicate a degree of cystitis. Question a degree of  bladder outlet obstruction given the size of the prostate and the degree of impression on the urinary bladder.  Cholelithiasis.  Aortic stent graft with questionable occlusion of the right iliac lumen. Aneurysm has not progressed from prior study.  Lap band at gastric cardia.  Extensive coronary artery calcification.  These results will be called to the ordering clinician or representative by the Radiologist Assistant, and communication documented in the PACS or zVision Dashboard.   Electronically Signed   By: Lowella Grip M.D.   On: 11/04/2013 12:19    Medications:  Prior to Admission:  Prescriptions prior to admission  Medication Sig Dispense Refill  . amLODipine (NORVASC) 5 MG tablet Take 5 mg by mouth daily.      . ciprofloxacin (CIPRO) 250 MG tablet Take 250 mg by mouth 2 (two) times daily.      . Dapagliflozin Propanediol (FARXIGA) 5 MG TABS Take 1 tablet by mouth daily.      . furosemide (LASIX) 40 MG tablet Take 1 tablet (40 mg total) by mouth daily.  30 tablet  6  . ibuprofen (ADVIL,MOTRIN) 200 MG tablet Take 400 mg by mouth every 6 (six) hours as needed for moderate pain.      Marland Kitchen irbesartan (AVAPRO) 300 MG tablet Take 300 mg by mouth daily.      Marland Kitchen lubiprostone (AMITIZA) 24 MCG capsule Take 24 mcg by mouth daily.      . nitroGLYCERIN (NITROSTAT) 0.4 MG SL tablet Place 1 tablet (0.4 mg total) under the tongue every 5 (five) minutes as needed for chest pain.  25 tablet  11  . potassium chloride (K-DUR,KLOR-CON) 20 MEQ tablet Take 1 tablet (20 mEq total) by mouth daily.  30 tablet  6  . pregabalin (LYRICA) 75 MG capsule Take 75 mg by mouth 3 (three) times daily.      . Tamsulosin HCl (FLOMAX) 0.4 MG CAPS Take 0.4 mg by mouth daily after supper.        . zolpidem (AMBIEN) 10 MG tablet Take 10 mg by mouth at bedtime as needed for sleep.       Scheduled: . heparin  5,000 Units Subcutaneous 3 times per  day  . piperacillin-tazobactam (ZOSYN)  IV  3.375 g Intravenous Q8H  . potassium chloride  20 mEq Oral Daily  . pregabalin  75 mg Oral TID  . sodium chloride  3 mL Intravenous Q12H  . tamsulosin  0.4 mg Oral QPC supper   Continuous:  VQM:[id number], zolpidem  Assesment: He has multiple problems as noted. He is growing Escherichia coli in his urine he is much better as far as that is concerned. I told him I think we should do CT chest itself to see if he has other pulmonary nodules. I think he can go home after the CT is done. I also ordered a PSA. He has been seeing a urologist but I don't think he's had a prostate biopsy. Principal Problem:   Sepsis-unclear source Active Problems:   HYPERTENSION   Laryngeal carcinoma   Obesity   Sleep apnea   Pulmonary infiltrate   Abdominal aneurysm without mention of rupture   Popliteal aneurysm   S/P bypass graft of extremity   History of laparoscopic adjustable gastric banding, APL, 12/19/2008.    Plan: PSA will be ordered he will have a CT of the chest. He will receive antibiotics but I think he can go home after the CT is completed. He will probably need urology evaluation and PET scan as  an outpatient    LOS: 2 days   Nomi Rudnicki L 11/05/2013, 9:58 AM

## 2013-11-07 LAB — GI PATHOGEN PANEL BY PCR, STOOL
C DIFFICILE TOXIN A/B: NEGATIVE
Campylobacter by PCR: NEGATIVE
Cryptosporidium by PCR: NEGATIVE
E coli (ETEC) LT/ST: NEGATIVE
E coli (STEC): NEGATIVE
E coli 0157 by PCR: NEGATIVE
G LAMBLIA BY PCR: NEGATIVE
NOROVIRUS G1/G2: NEGATIVE
ROTAVIRUS A BY PCR: NEGATIVE
SALMONELLA BY PCR: NEGATIVE
Shigella by PCR: NEGATIVE

## 2013-11-08 LAB — CULTURE, BLOOD (ROUTINE X 2)
Culture: NO GROWTH
Culture: NO GROWTH

## 2013-11-08 LAB — PSA: PSA: 106.2 ng/mL — ABNORMAL HIGH (ref <=4.00)

## 2013-11-08 LAB — FREE PSA
PSA FREE: 5.16 ng/mL
PSA, Free Pct: 5 % — ABNORMAL LOW (ref >25)

## 2013-11-14 ENCOUNTER — Other Ambulatory Visit (HOSPITAL_COMMUNITY): Payer: Self-pay | Admitting: Pulmonary Disease

## 2013-11-14 DIAGNOSIS — R911 Solitary pulmonary nodule: Secondary | ICD-10-CM

## 2013-11-18 IMAGING — CR DG CHEST 2V
2 series · 2 of 2 positions shown · non-contrast
Comparison: None.

CLINICAL DATA: Shortness of breath.

CHEST - 2 VIEW

[view not recorded (1 of 2)]
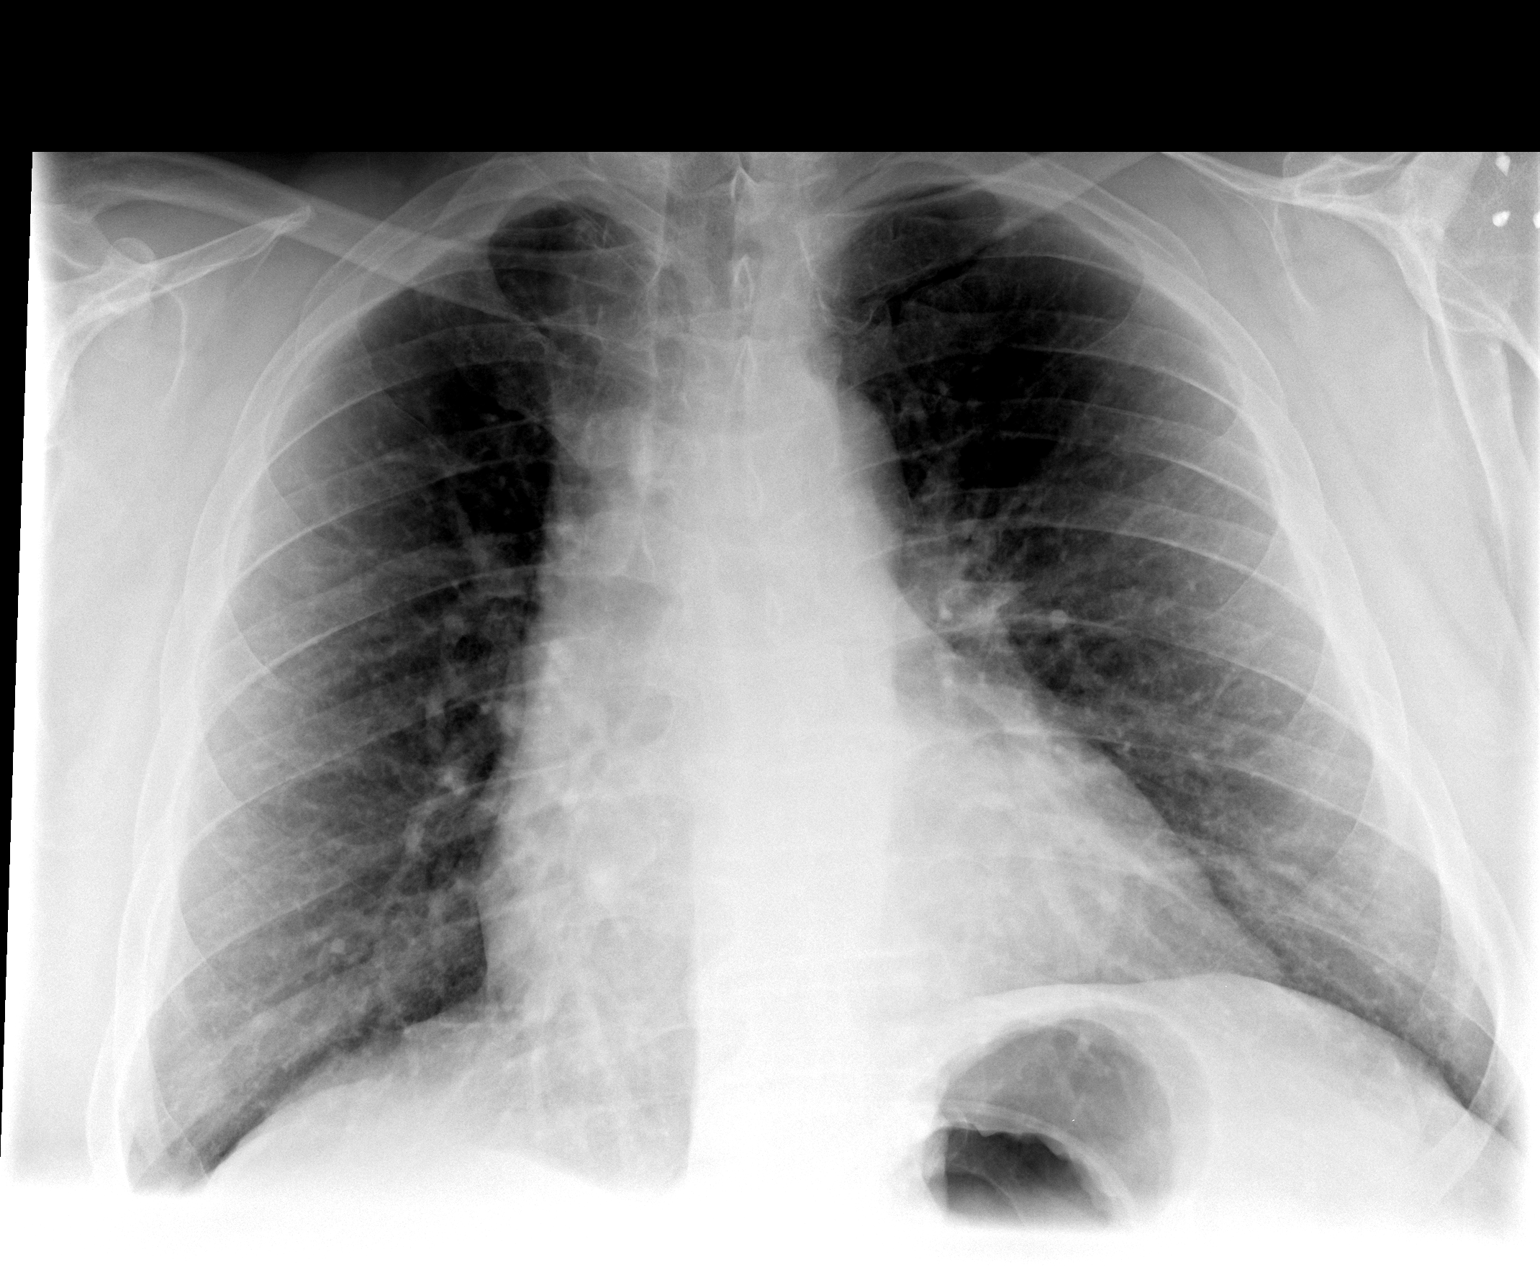

[view not recorded (2 of 2)]
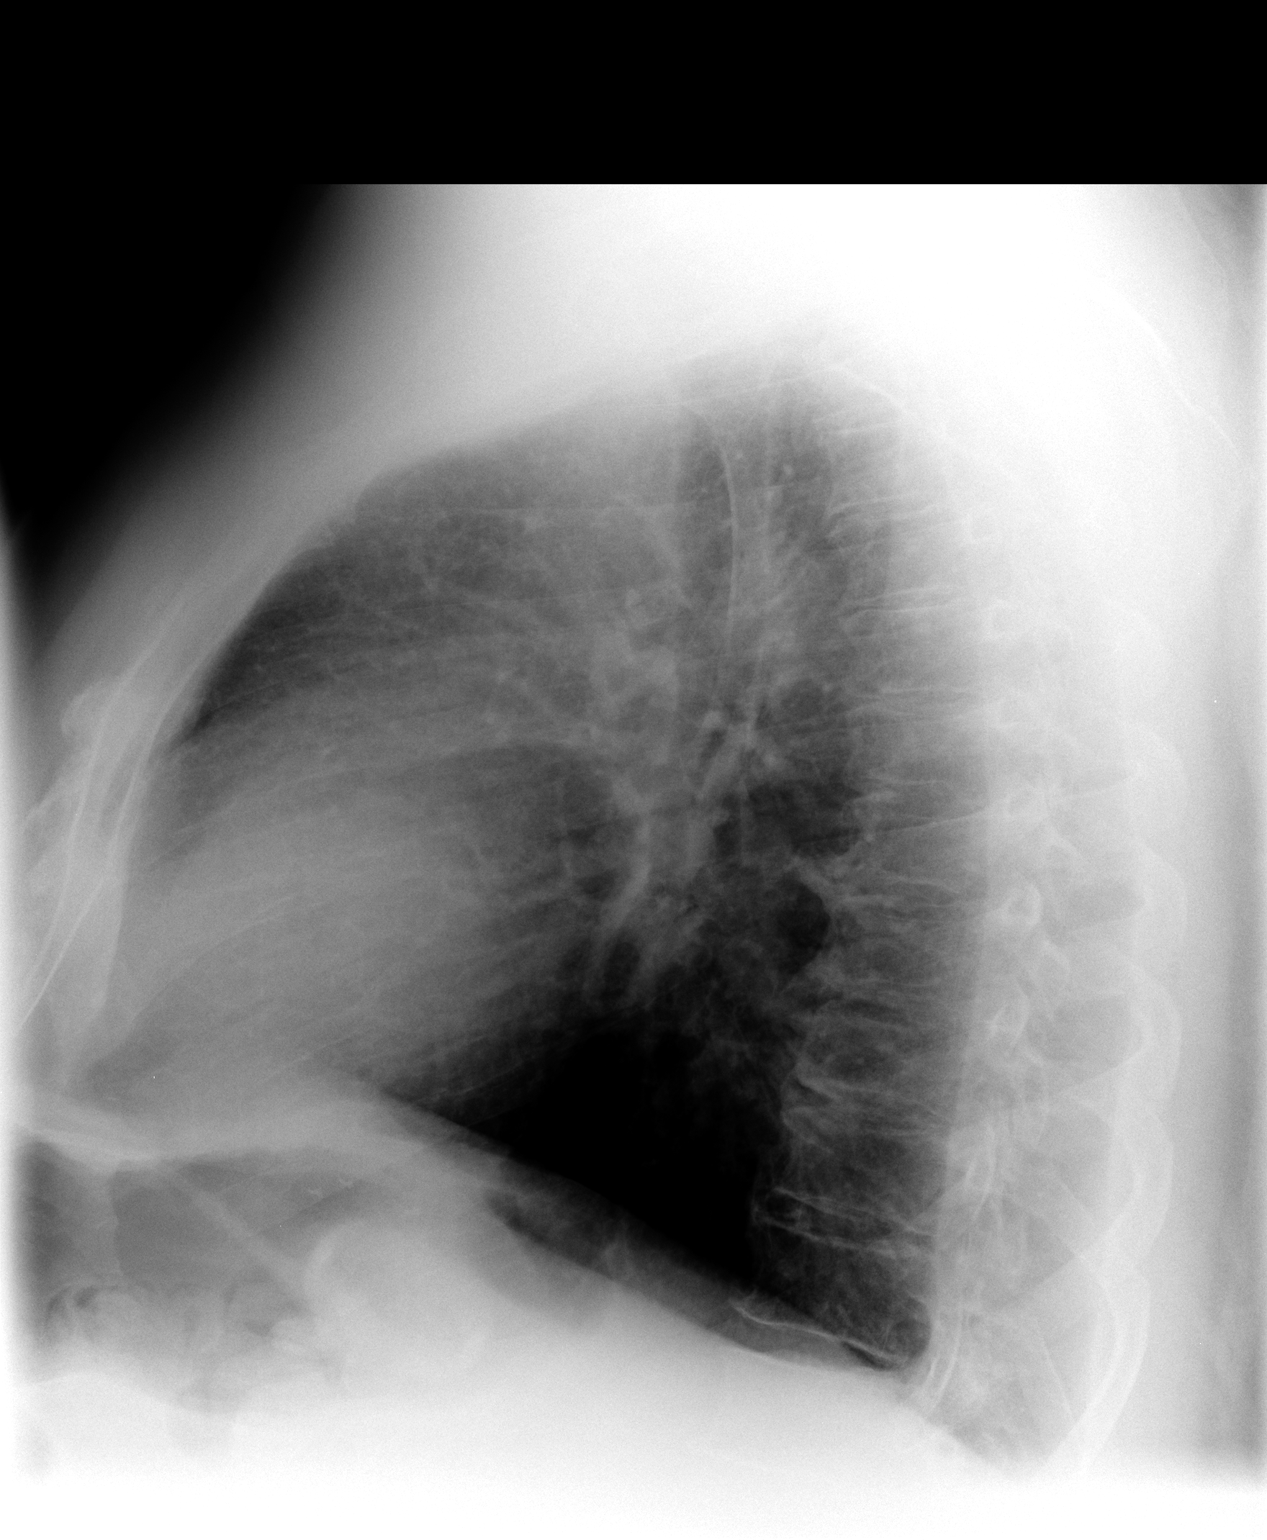

[2 of 2 positions shown; findings below may reference images not displayed]

FINDINGS: The heart size and mediastinal contours are within
normal limits.  Both lungs are clear.  Chronic pulmonary
interstitial prominence remains stable.  The visualized skeletal
structures are unremarkable.
IMPRESSION: Stable interstitial prominence.  No active cardiopulmonary disease.

## 2013-11-22 ENCOUNTER — Ambulatory Visit (HOSPITAL_COMMUNITY)
Admission: RE | Admit: 2013-11-22 | Discharge: 2013-11-22 | Disposition: A | Discharge status: Home / Self Care | Payer: Medicare Other | Source: Ambulatory Visit | Attending: Pulmonary Disease | Admitting: Pulmonary Disease

## 2013-11-22 ENCOUNTER — Encounter (HOSPITAL_COMMUNITY): Payer: Self-pay

## 2013-11-22 DIAGNOSIS — R911 Solitary pulmonary nodule: Secondary | ICD-10-CM

## 2013-11-22 DIAGNOSIS — K8020 Calculus of gallbladder without cholecystitis without obstruction: Secondary | ICD-10-CM | POA: Diagnosis not present

## 2013-11-22 DIAGNOSIS — Z9884 Bariatric surgery status: Secondary | ICD-10-CM | POA: Diagnosis not present

## 2013-11-22 DIAGNOSIS — R918 Other nonspecific abnormal finding of lung field: Secondary | ICD-10-CM | POA: Insufficient documentation

## 2013-11-22 LAB — GLUCOSE, CAPILLARY: Glucose-Capillary: 154 mg/dL — ABNORMAL HIGH (ref 70–99)

## 2013-11-22 MED ORDER — FLUDEOXYGLUCOSE F - 18 (FDG) INJECTION: 14.5000 | Freq: Once | INTRAVENOUS | Status: AC | PRN

## 2014-01-03 ENCOUNTER — Other Ambulatory Visit: Payer: Self-pay | Admitting: Cardiovascular Disease

## 2014-01-20 ENCOUNTER — Encounter (HOSPITAL_COMMUNITY): Payer: Self-pay | Admitting: Emergency Medicine

## 2014-01-20 ENCOUNTER — Emergency Department (HOSPITAL_COMMUNITY): Payer: Medicare Other

## 2014-01-20 ENCOUNTER — Inpatient Hospital Stay (HOSPITAL_COMMUNITY)
Admission: EM | Admit: 2014-01-20 | Discharge: 2014-01-20 | DRG: 872 | Disposition: A | Discharge status: Other Acute Inpatient Hospital | Payer: Medicare Other | Attending: Emergency Medicine | Admitting: Emergency Medicine

## 2014-01-20 DIAGNOSIS — Z9861 Coronary angioplasty status: Secondary | ICD-10-CM | POA: Diagnosis not present

## 2014-01-20 DIAGNOSIS — Z8673 Personal history of transient ischemic attack (TIA), and cerebral infarction without residual deficits: Secondary | ICD-10-CM | POA: Diagnosis not present

## 2014-01-20 DIAGNOSIS — R972 Elevated prostate specific antigen [PSA]: Secondary | ICD-10-CM | POA: Diagnosis not present

## 2014-01-20 DIAGNOSIS — Z9849 Cataract extraction status, unspecified eye: Secondary | ICD-10-CM

## 2014-01-20 DIAGNOSIS — N3000 Acute cystitis without hematuria: Secondary | ICD-10-CM

## 2014-01-20 DIAGNOSIS — E669 Obesity, unspecified: Secondary | ICD-10-CM | POA: Diagnosis present

## 2014-01-20 DIAGNOSIS — I252 Old myocardial infarction: Secondary | ICD-10-CM

## 2014-01-20 DIAGNOSIS — N453 Epididymo-orchitis: Secondary | ICD-10-CM | POA: Diagnosis present

## 2014-01-20 DIAGNOSIS — N419 Inflammatory disease of prostate, unspecified: Secondary | ICD-10-CM | POA: Diagnosis not present

## 2014-01-20 DIAGNOSIS — Z8521 Personal history of malignant neoplasm of larynx: Secondary | ICD-10-CM | POA: Diagnosis not present

## 2014-01-20 DIAGNOSIS — N390 Urinary tract infection, site not specified: Secondary | ICD-10-CM | POA: Diagnosis present

## 2014-01-20 DIAGNOSIS — I2510 Atherosclerotic heart disease of native coronary artery without angina pectoris: Secondary | ICD-10-CM | POA: Diagnosis present

## 2014-01-20 DIAGNOSIS — G4730 Sleep apnea, unspecified: Secondary | ICD-10-CM | POA: Diagnosis not present

## 2014-01-20 DIAGNOSIS — N452 Orchitis: Secondary | ICD-10-CM

## 2014-01-20 DIAGNOSIS — A419 Sepsis, unspecified organism: Principal | ICD-10-CM | POA: Diagnosis present

## 2014-01-20 DIAGNOSIS — Z87891 Personal history of nicotine dependence: Secondary | ICD-10-CM | POA: Diagnosis not present

## 2014-01-20 DIAGNOSIS — Z923 Personal history of irradiation: Secondary | ICD-10-CM | POA: Diagnosis not present

## 2014-01-20 DIAGNOSIS — N400 Enlarged prostate without lower urinary tract symptoms: Secondary | ICD-10-CM | POA: Diagnosis present

## 2014-01-20 DIAGNOSIS — R6520 Severe sepsis without septic shock: Secondary | ICD-10-CM

## 2014-01-20 DIAGNOSIS — I10 Essential (primary) hypertension: Secondary | ICD-10-CM | POA: Diagnosis present

## 2014-01-20 DIAGNOSIS — R509 Fever, unspecified: Secondary | ICD-10-CM | POA: Diagnosis not present

## 2014-01-20 DIAGNOSIS — Z6841 Body Mass Index (BMI) 40.0 and over, adult: Secondary | ICD-10-CM

## 2014-01-20 DIAGNOSIS — K219 Gastro-esophageal reflux disease without esophagitis: Secondary | ICD-10-CM | POA: Diagnosis present

## 2014-01-20 LAB — CBC WITH DIFFERENTIAL/PLATELET
BASOS ABS: 0 10*3/uL (ref 0.0–0.1)
Basophils Relative: 0 % (ref 0–1)
EOS PCT: 0 % (ref 0–5)
Eosinophils Absolute: 0.1 10*3/uL (ref 0.0–0.7)
HCT: 40.9 % (ref 39.0–52.0)
Hemoglobin: 14.2 g/dL (ref 13.0–17.0)
LYMPHS ABS: 1 10*3/uL (ref 0.7–4.0)
Lymphocytes Relative: 7 % — ABNORMAL LOW (ref 12–46)
MCH: 31.3 pg (ref 26.0–34.0)
MCHC: 34.7 g/dL (ref 30.0–36.0)
MCV: 90.1 fL (ref 78.0–100.0)
Monocytes Absolute: 1.1 10*3/uL — ABNORMAL HIGH (ref 0.1–1.0)
Monocytes Relative: 8 % (ref 3–12)
Neutro Abs: 12.5 10*3/uL — ABNORMAL HIGH (ref 1.7–7.7)
Neutrophils Relative %: 85 % — ABNORMAL HIGH (ref 43–77)
PLATELETS: 185 10*3/uL (ref 150–400)
RBC: 4.54 MIL/uL (ref 4.22–5.81)
RDW: 14 % (ref 11.5–15.5)
WBC: 14.7 10*3/uL — ABNORMAL HIGH (ref 4.0–10.5)

## 2014-01-20 LAB — URINE MICROSCOPIC-ADD ON

## 2014-01-20 LAB — COMPREHENSIVE METABOLIC PANEL
ALT: 19 U/L (ref 0–53)
AST: 17 U/L (ref 0–37)
Albumin: 3.4 g/dL — ABNORMAL LOW (ref 3.5–5.2)
Alkaline Phosphatase: 77 U/L (ref 39–117)
Anion gap: 18 — ABNORMAL HIGH (ref 5–15)
BUN: 11 mg/dL (ref 6–23)
CO2: 19 mEq/L (ref 19–32)
Calcium: 9.1 mg/dL (ref 8.4–10.5)
Chloride: 94 mEq/L — ABNORMAL LOW (ref 96–112)
Creatinine, Ser: 0.62 mg/dL (ref 0.50–1.35)
GFR calc non Af Amer: >90 mL/min (ref >90)
GLUCOSE: 164 mg/dL — AB (ref 70–99)
Potassium: 4.6 mEq/L (ref 3.7–5.3)
Sodium: 131 mEq/L — ABNORMAL LOW (ref 137–147)
Total Bilirubin: 0.5 mg/dL (ref 0.3–1.2)
Total Protein: 7.5 g/dL (ref 6.0–8.3)

## 2014-01-20 LAB — URINALYSIS, ROUTINE W REFLEX MICROSCOPIC
Bilirubin Urine: NEGATIVE
GLUCOSE, UA: NEGATIVE mg/dL
Ketones, ur: NEGATIVE mg/dL
Nitrite: NEGATIVE
PROTEIN: 30 mg/dL — AB
Specific Gravity, Urine: 1.016 (ref 1.005–1.030)
UROBILINOGEN UA: 0.2 mg/dL (ref 0.0–1.0)
pH: 5.5 (ref 5.0–8.0)

## 2014-01-20 LAB — I-STAT CG4 LACTIC ACID, ED
Lactic Acid, Venous: 4.35 mmol/L — ABNORMAL HIGH (ref 0.5–2.2)
Lactic Acid, Venous: 3.11 mmol/L — ABNORMAL HIGH (ref 0.5–2.2)

## 2014-01-20 MED ORDER — MORPHINE SULFATE 4 MG/ML IJ SOLN
6.0000 mg | Freq: Once | INTRAMUSCULAR | Status: AC
  Administered 2014-01-20: 6 mg via INTRAVENOUS
  Filled 2014-01-20: qty 2

## 2014-01-20 MED ORDER — SODIUM CHLORIDE 0.9 % IV BOLUS (SEPSIS)
1000.0000 mL | Freq: Once | INTRAVENOUS | Status: AC
  Administered 2014-01-20: 1000 mL via INTRAVENOUS

## 2014-01-20 MED ORDER — VANCOMYCIN HCL 10 G IV SOLR
2000.0000 mg | Freq: Once | INTRAVENOUS | Status: AC
  Administered 2014-01-20: 2000 mg via INTRAVENOUS
  Filled 2014-01-20: qty 2000

## 2014-01-20 MED ORDER — CEFEPIME HCL 1 G IJ SOLR
1.0000 g | Freq: Three times a day (TID) | INTRAMUSCULAR | Status: DC
  Administered 2014-01-20: 1 g via INTRAVENOUS
  Filled 2014-01-20: qty 1

## 2014-01-20 NOTE — ED Notes (Signed)
MD at bedside. 

## 2014-01-20 NOTE — ED Notes (Signed)
Pt back from US

## 2014-01-20 NOTE — ED Notes (Signed)
Had TURP performed last Wednesday at [facility].  Per ems: c/o testicular pain since last night, left testicle feels swollen. Febrile at triage.  115/56, 108 bpm, 95%ra.  Tachycardic, tachypnic , and febrile at triage, hx of UTI

## 2014-01-20 NOTE — ED Notes (Signed)
CG-4 reported to Dr. Minerva Ends

## 2014-01-20 NOTE — ED Provider Notes (Signed)
I saw and evaluated the patient, reviewed the resident's note and I agree with the findings and plan.   .Face to face Exam:  General:  Awake HEENT:  Atraumatic Resp:  Normal effort Abd:  Nondistended Neuro:No focal weakness  Dot Lanes, MD 01/20/14 2356

## 2014-01-20 NOTE — Consult Note (Signed)
Reason for Consult: Urosepsis  Referring Physician: Rockwell Alexandria MD  Mathew Padilla is an 63 y.o. male.   HPI:   1 - Urosepsis / Recurrent UTI / Epidiymorchitis / Prostatitis- . Ho prior UTI with pyelo including admission 10/2013 to [facility] for pan-sensitive e. Coli pyelo. Underwent TURP at [facility] 01/11/2014 to try to help with recurrent infections and now with 1 day spiking fevers, malaise, dysuria, acute left testicle pain and swelling. At presentation today febrile >103, tachycardic with pyuria and scrotal US consistent with epidiiymorchitis w/o abscess, prostate very hot and tender on exam. Denies diabetes, immune compromise. CT 2015 w/o hydro or stones. Most recent CX at [facility] negative per report. He has been on Cipro and Bactrim recently per report.   2 - Lower Urinary Tract Symptom / Massive Prostatic Hypertrophy - s/p TURP at [facility] 01/11/14. Prostate volume calculated 270gm by CT measurements 10/2013 pre TURP.  3 - Elevated PSA - PSA >100 by labs 10/2013 during admission for pyelo urosepsis. Path from recent TURP pending.   Today " Mathew Padilla" is seen in consultaiton for above. He is being admitted to critical care for suspect urosepsis. His primary urologist is Clent Jacks at [facility].    Past Medical History  Diagnosis Date  . Arteriosclerotic cardiovascular disease (ASCVD) 2000 he    MI in 1999; PTCA of PDA and 2nd marginal in 3/00  . AAA (abdominal aortic aneurysm) 2007    stent graft repair 12/07  . Laryngeal carcinoma 2010    resection and radiation therapy 2010-11  . Diverticulitis 2006  . Obesity     BMI 47; lap band surgery 2010  . Gastroesophageal reflux disease     Hiatal hernia; previously treated with PPI  . Tobacco abuse, in remission     40 pack years; discontinued in 2000  . Cerebrovascular disease   . Cholelithiasis 11/28/2010  . Elevated PSA 06/2011  . Sleep apnea     BiPAP mask utilized        2 yrs sleep study    dr Redmond Pulling  . Hypertension     Mild;  controlled with a single agent       dr Lattie Haw      cardiac md  . Cough, persistent   . 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 Peripheral arterial stent graft  2007    for abdominal aortic aneurysm  . Larynx surgery  2010    Resection of carcinoma  . Open anterior shoulder reconstruction  2011    Left  . Laparoscopic gastric banding  2010  . Eye surgery    . Cataract extraction    . Colonoscopy  10/2007  . Femoral-femoral bypass graft  04/18/2011    Procedure: BYPASS GRAFT FEMORAL-FEMORAL ARTERY;  Surgeon: Theotis Burrow, MD;  Location: [facility] OR;  Service: Vascular;  Laterality: Bilateral;  Left to right femoral to femoral bypass   . Femoral-popliteal bypass graft  04/18/2011    Procedure: BYPASS GRAFT FEMORAL-POPLITEAL ARTERY;  Surgeon: Theotis Burrow, MD;  Location: [facility] OR;  Service: Vascular;  Laterality: Right;  Right femoral popliteal bypass with propaten gortex graft  . Cardiac catheterization  2000    PCI-stent  . Pr vein bypass graft,aorto-fem-pop  07-23-11    Left AK to BK popliteal BPG using rev. saphenous vein  . Colonoscopy with esophagogastroduodenoscopy (egd)  2009    RMR: He had distal  esophageal erosions, patulous GE junction, erosive reflux esophagitis, hiatal hernia, lipoma on the angularis not manipulated, left-sided diverticula.    Family History  Problem Relation Age of Onset  . Heart attack Father     deceased  . Heart failure Father   . Hyperlipidemia Father   . Hypertension Father   . Heart disease Father   . Lung cancer Mother     deceased   . Hypertension Mother   . Hyperlipidemia Mother   . Heart failure Mother   . Diabetes Mother   . Cancer Sister     vaginal  . Diabetes Sister   . Colon cancer Neg Hx     Social History:  reports that he quit smoking about 16 years ago. His smoking use included Cigarettes. He has a 60 pack-year smoking history. He has never used smokeless tobacco.  He reports that he does not drink alcohol or use illicit drugs.  Allergies:  Allergies  Allergen Reactions  . Aspirin     Daily dose upsets stomach  . Statins Hives and Rash    Medications: I have reviewed the patient's current medications.  Results for orders placed during the hospital encounter of 01/20/14 (from the past 48 hour(s))  URINALYSIS, ROUTINE W REFLEX MICROSCOPIC     Status: Abnormal   Collection Time    01/20/14  6:02 PM      Result Value Ref Range   Color, Urine YELLOW  YELLOW   APPearance HAZY (*) CLEAR   Specific Gravity, Urine 1.016  1.005 - 1.030   pH 5.5  5.0 - 8.0   Glucose, UA NEGATIVE  NEGATIVE mg/dL   Hgb urine dipstick LARGE (*) NEGATIVE   Bilirubin Urine NEGATIVE  NEGATIVE   Ketones, ur NEGATIVE  NEGATIVE mg/dL   Protein, ur 30 (*) NEGATIVE mg/dL   Urobilinogen, UA 0.2  0.0 - 1.0 mg/dL   Nitrite NEGATIVE  NEGATIVE   Leukocytes, UA MODERATE (*) NEGATIVE  URINE MICROSCOPIC-ADD ON     Status: Abnormal   Collection Time    01/20/14  6:02 PM      Result Value Ref Range   Squamous Epithelial / LPF RARE  RARE   WBC, UA 21-50  <3 WBC/hpf   RBC / HPF 11-20  <3 RBC/hpf   Bacteria, UA FEW (*) RARE   Urine-Other MUCOUS PRESENT    CBC WITH DIFFERENTIAL     Status: Abnormal   Collection Time    01/20/14  6:25 PM      Result Value Ref Range   WBC 14.7 (*) 4.0 - 10.5 K/uL   RBC 4.54  4.22 - 5.81 MIL/uL   Hemoglobin 14.2  13.0 - 17.0 g/dL   HCT 40.9  39.0 - 52.0 %   MCV 90.1  78.0 - 100.0 fL   MCH 31.3  26.0 - 34.0 pg   MCHC 34.7  30.0 - 36.0 g/dL   RDW 14.0  11.5 - 15.5 %   Platelets 185  150 - 400 K/uL   Neutrophils Relative % 85 (*) 43 - 77 %   Neutro Abs 12.5 (*) 1.7 - 7.7 K/uL   Lymphocytes Relative 7 (*) 12 - 46 %   Lymphs Abs 1.0  0.7 - 4.0 K/uL   Monocytes Relative 8  3 - 12 %   Monocytes Absolute 1.1 (*) 0.1 - 1.0 K/uL   Eosinophils Relative 0  0 - 5 %   Eosinophils Absolute 0.1  0.0 - 0.7 K/uL  Basophils Relative 0  0 - 1 %   Basophils  Absolute 0.0  0.0 - 0.1 K/uL  COMPREHENSIVE METABOLIC PANEL     Status: Abnormal   Collection Time    01/20/14  6:25 PM      Result Value Ref Range   Sodium 131 (*) 137 - 147 mEq/L   Potassium 4.6  3.7 - 5.3 mEq/L   Chloride 94 (*) 96 - 112 mEq/L   CO2 19  19 - 32 mEq/L   Glucose, Bld 164 (*) 70 - 99 mg/dL   BUN 11  6 - 23 mg/dL   Creatinine, Ser 0.62  0.50 - 1.35 mg/dL   Calcium 9.1  8.4 - 10.5 mg/dL   Total Protein 7.5  6.0 - 8.3 g/dL   Albumin 3.4 (*) 3.5 - 5.2 g/dL   AST 17  0 - 37 U/L   ALT 19  0 - 53 U/L   Alkaline Phosphatase 77  39 - 117 U/L   Total Bilirubin 0.5  0.3 - 1.2 mg/dL   GFR calc non Af Amer >90  >90 mL/min   GFR calc Af Amer >90  >90 mL/min   Comment: (NOTE)     The eGFR has been calculated using the CKD EPI equation.     This calculation has not been validated in all clinical situations.     eGFR's persistently <90 mL/min signify possible Chronic Kidney     Disease.   Anion gap 18 (*) 5 - 15  I-STAT CG4 LACTIC ACID, ED     Status: Abnormal   Collection Time    01/20/14  6:46 PM      Result Value Ref Range   Lactic Acid, Venous 4.35 (*) 0.5 - 2.2 mmol/L    US Scrotum  01/20/2014   CLINICAL DATA:  Testicle pain.  EXAM: SCROTAL ULTRASOUND  DOPPLER ULTRASOUND OF THE TESTICLES  TECHNIQUE: Complete ultrasound examination of the testicles, epididymis, and other scrotal structures was performed. Color and spectral Doppler ultrasound were also utilized to evaluate blood flow to the testicles.  COMPARISON:  CT of the abdomen and pelvis November 04, 2013  FINDINGS: Right testicle  Measurements: 4.7 x 2.6 x 3 cm. No mass identified. A few scattered microlithiasis.  Left testicle  Measurements: 4.8 x 3.3 x 3.2 cm. Somewhat heterogeneous in density, with increased vascularity. A few scattered microlithiasis.  Right epididymis: Normal in size and appearance. RIGHT scrotal pleural.  Left epididymis:  Enlarged, heterogeneous and hypervascular.  Hydrocele:  Small bilateral  hydroceles, LEFT greater than RIGHT.  Varicocele:  None visualized.  Pulsed Doppler interrogation of both testes demonstrates low resistance arterial and venous waveforms bilaterally.  IMPRESSION: LEFT epididymo-orchitis.  Bilateral scattered testicular microlithiasis: Current literature suggests that testicular microlithiasis is not a significant independent risk factor for development of testicular carcinoma, and that follow up imaging is not warranted in the absence of other risk factors. Monthly testicular self-examination and annual physical exams are considered appropriate surveillance. If patient has other risk factors for testicular carcinoma, then referral to Urology should be considered. (Reference: DeCastro, et al.: A 5-Year Follow up Study of Asymptomatic Men with Testicular Microlithiasis. J Urol 2008; 858:8502-7741.)   Electronically Signed   By: Elon Alas   On: 01/20/2014 19:43   [place] Art/ven Flow Abd Pelv Doppler  01/20/2014   CLINICAL DATA:  Testicle pain.  EXAM: SCROTAL ULTRASOUND  DOPPLER ULTRASOUND OF THE TESTICLES  TECHNIQUE: Complete ultrasound examination of the testicles, epididymis, and other  scrotal structures was performed. Color and spectral Doppler ultrasound were also utilized to evaluate blood flow to the testicles.  COMPARISON:  CT of the abdomen and pelvis November 04, 2013  FINDINGS: Right testicle  Measurements: 4.7 x 2.6 x 3 cm. No mass identified. A few scattered microlithiasis.  Left testicle  Measurements: 4.8 x 3.3 x 3.2 cm. Somewhat heterogeneous in density, with increased vascularity. A few scattered microlithiasis.  Right epididymis: Normal in size and appearance. RIGHT scrotal pleural.  Left epididymis:  Enlarged, heterogeneous and hypervascular.  Hydrocele:  Small bilateral hydroceles, LEFT greater than RIGHT.  Varicocele:  None visualized.  Pulsed Doppler interrogation of both testes demonstrates low resistance arterial and venous waveforms bilaterally.  IMPRESSION:  LEFT epididymo-orchitis.  Bilateral scattered testicular microlithiasis: Current literature suggests that testicular microlithiasis is not a significant independent risk factor for development of testicular carcinoma, and that follow up imaging is not warranted in the absence of other risk factors. Monthly testicular self-examination and annual physical exams are considered appropriate surveillance. If patient has other risk factors for testicular carcinoma, then referral to Urology should be considered. (Reference: DeCastro, et al.: A 5-Year Follow up Study of Asymptomatic Men with Testicular Microlithiasis. J Urol 2008; 875:6433-2951.)   Electronically Signed   By: Elon Alas   On: 01/20/2014 19:43   Dg Chest Portable 1 View  01/20/2014   CLINICAL DATA:  Fever, testicular pain  EXAM: PORTABLE CHEST - 1 VIEW  COMPARISON:  Portable exam 1752 hr. Prior exams will not display in PACs at this time for comparison.  FINDINGS: Enlargement of cardiac silhouette.  Mediastinal contours and pulmonary vascularity normal.  Lungs clear.  No pleural effusion or pneumothorax.  No acute osseous findings.  IMPRESSION: Enlargement of cardiac silhouette.  No acute abnormalities.   Electronically Signed   By: Lavonia Dana M.D.   On: 01/20/2014 18:36    Review of Systems  Constitutional: Positive for fever, chills and malaise/fatigue.  HENT: Negative.   Eyes: Negative.   Respiratory: Negative.   Cardiovascular: Negative.   Gastrointestinal: Negative.   Genitourinary: Positive for dysuria, urgency and frequency.       Severe left scrotal pain  Musculoskeletal: Negative.   Skin: Negative.   Neurological: Negative.   Endo/Heme/Allergies: Negative.   Psychiatric/Behavioral: Negative.    Blood pressure 120/48, pulse 106, temperature 102.3 F (39.1 C), temperature source Oral, resp. rate 19, height '5\' 11"'  (1.803 m), weight 147.419 kg (325 lb), SpO2 100.00%. Physical Exam  Constitutional: He is oriented to person,  place, and time. He appears well-developed.  IN visible discomfort  HENT:  Head: Normocephalic and atraumatic.  Eyes: EOM are normal. Pupils are equal, round, and reactive to light.  Neck: Normal range of motion. Neck supple.  Cardiovascular: Normal rate and regular rhythm.   Respiratory: Effort normal.  GI: Soft. Bowel sounds are normal.  Morbid truncal obesity.  Genitourinary:  Hot, tender prostate. Very tender left testicle approx 2X size normal.  Musculoskeletal: Normal range of motion.  Neurological: He is alert and oriented to person, place, and time.  Skin: Skin is warm and dry.  Psychiatric: He has a normal mood and affect. His behavior is normal. Judgment and thought content normal.   62F foley placed with immediate efflux 500cc cloudy urine, 10cc sterile water in balloon.   Assessment/Plan:  1 - Urosepsis / Recurrent UTI / Epidiymorchitis / Prostatitis- Likely seeded ascending infection after TURP at outside facility. 62F foley placed in ER with immediate efflux 500cc cloudy  urine. Please keep in place untile afebrile x 24 hrs for maximal drianage then may remove. Agree with medical admission for IV ABX, preferably NOT fluoroquinolone as he has been on these recently and suspect current etiology may be resistant. No further surgical indications. Will need at least 2 weeks total ABX given likely prostate and testicle involvement.   I called and spoke with Urology on call at [facility] and no recent data there to further guide therapy.   2 - Lower Urinary Tract Symptom / Massive Prostatic Hypertrophy - some retention still post-TURP, but had been catheter free prior to admission. Path pending as on 9/25 when I called to [facility].   3 - Elevated PSA - Path pending from time of TURP, DDX inflammation due to infection v. Neoplasm.  4 - Will follow, please call with questions anytime.   Treyana Sturgell 01/20/2014, 8:21 PM

## 2014-01-20 NOTE — ED Notes (Signed)
Pt and pts family states that they spoke with Dr Arnoldo Morale and they wish to transfer to [facility] on [address] and made her aware.

## 2014-01-20 NOTE — ED Notes (Addendum)
Placed condom cath, leg strap and bag on patient.

## 2014-01-20 NOTE — ED Provider Notes (Signed)
CSN: [id number]     Arrival date & time 01/20/14  1723 History   First MD Initiated Contact with Patient 01/20/14 1731     Chief Complaint  Patient presents with  . Fever  . Testicle Pain     (Consider location/radiation/quality/duration/timing/severity/associated sxs/prior Treatment) Patient is a 63 y.o. male presenting with fever. The history is provided by the patient.  Fever Max temp prior to arrival:  Subj Temp source:  Subjective Severity:  Mild Onset quality:  Gradual Duration:  1 day Timing:  Constant Progression:  Worsening Chronicity:  New Relieved by:  Nothing Worsened by:  Nothing tried Ineffective treatments:  None tried Associated symptoms: chills, dysuria and nausea   Associated symptoms: no chest pain, no congestion, no cough, no diarrhea, no rash, no rhinorrhea and no vomiting   Risk factors comment:  Hx of pyelo in past   Past Medical History  Diagnosis Date  . Arteriosclerotic cardiovascular disease (ASCVD) 2000 he    MI in 1999; PTCA of PDA and 2nd marginal in 3/00  . AAA (abdominal aortic aneurysm) 2007    stent graft repair 12/07  . Laryngeal carcinoma 2010    resection and radiation therapy 2010-11  . Diverticulitis 2006  . Obesity     BMI 47; lap band surgery 2010  . Gastroesophageal reflux disease     Hiatal hernia; previously treated with PPI  . Tobacco abuse, in remission     40 pack years; discontinued in 2000  . Cerebrovascular disease   . Cholelithiasis 11/28/2010  . Elevated PSA 06/2011  . Sleep apnea     BiPAP mask utilized        2 yrs sleep study    dr Redmond Pulling  . Hypertension     Mild; controlled with a single agent       dr Lattie Haw      cardiac md  . Cough, persistent   . Popliteal aneurysm     popliteal bypass-2013   Past Surgical History  Procedure Laterality Date  . Peripheral arterial stent graft  2007    for abdominal aortic aneurysm  . Larynx surgery  2010    Resection of carcinoma  . Open anterior shoulder  reconstruction  2011    Left  . Laparoscopic gastric banding  2010  . Eye surgery    . Cataract extraction    . Colonoscopy  10/2007  . Femoral-femoral bypass graft  04/18/2011    Procedure: BYPASS GRAFT FEMORAL-FEMORAL ARTERY;  Surgeon: Theotis Burrow, MD;  Location: [facility] OR;  Service: Vascular;  Laterality: Bilateral;  Left to right femoral to femoral bypass   . Femoral-popliteal bypass graft  04/18/2011    Procedure: BYPASS GRAFT FEMORAL-POPLITEAL ARTERY;  Surgeon: Theotis Burrow, MD;  Location: [facility] OR;  Service: Vascular;  Laterality: Right;  Right femoral popliteal bypass with propaten gortex graft  . Cardiac catheterization  2000    PCI-stent  . Pr vein bypass graft,aorto-fem-pop  07-23-11    Left AK to BK popliteal BPG using rev. saphenous vein  . Colonoscopy with esophagogastroduodenoscopy (egd)  2009    RMR: He had distal esophageal erosions, patulous GE junction, erosive reflux esophagitis, hiatal hernia, lipoma on the angularis not manipulated, left-sided diverticula.   Family History  Problem Relation Age of Onset  . Heart attack Father     deceased  . Heart failure Father   . Hyperlipidemia Father   . Hypertension Father   . Heart disease Father   . Lung  cancer Mother     deceased   . Hypertension Mother   . Hyperlipidemia Mother   . Heart failure Mother   . Diabetes Mother   . Cancer Sister     vaginal  . Diabetes Sister   . Colon cancer Neg Hx    History  Substance Use Topics  . Smoking status: Former Smoker -- 2.00 packs/day for 30 years    Types: Cigarettes    Quit date: 11/26/1997  . Smokeless tobacco: Never Used     Comment: 40 pack years; quit 2000  . Alcohol Use: No    Review of Systems  Constitutional: Positive for fever, chills and fatigue. Negative for activity change and appetite change.  HENT: Negative for congestion and rhinorrhea.   Eyes: Negative for discharge and itching.  Respiratory: Negative for cough, shortness of breath and wheezing.    Cardiovascular: Negative for chest pain.  Gastrointestinal: Positive for nausea. Negative for vomiting, abdominal pain, diarrhea and constipation.  Genitourinary: Positive for dysuria, scrotal swelling and testicular pain. Negative for hematuria, decreased urine volume and difficulty urinating.  Skin: Negative for rash and wound.  Neurological: Negative for syncope, weakness and numbness.  All other systems reviewed and are negative.     Allergies  Aspirin and Statins  Home Medications   Prior to Admission medications   Medication Sig Start Date End Date Taking? Authorizing Provider  furosemide (LASIX) 40 MG tablet Take 20 mg by mouth daily.   Yes Historical Provider, MD  irbesartan (AVAPRO) 300 MG tablet Take 300 mg by mouth daily.   Yes Historical Provider, MD  metFORMIN (GLUCOPHAGE-XR) 500 MG 24 hr tablet Take 500 mg by mouth 2 (two) times daily.   Yes Historical Provider, MD  potassium chloride (K-DUR,KLOR-CON) 20 MEQ tablet Take 1 tablet (20 mEq total) by mouth daily. 10/25/12  Yes Burnell Blanks, MD  pregabalin (LYRICA) 75 MG capsule Take 75 mg by mouth 3 (three) times daily.   Yes Historical Provider, MD  sulfamethoxazole-trimethoprim (BACTRIM DS) 800-160 MG per tablet Take 1 tablet by mouth 2 (two) times daily. 11/05/13  Yes Alonza Bogus, MD  ibuprofen (ADVIL,MOTRIN) 200 MG tablet Take 200 mg by mouth daily as needed for mild pain or moderate pain.     Historical Provider, MD  lubiprostone (AMITIZA) 8 MCG capsule Take 8 mcg by mouth 2 (two) times daily with a meal.    Historical Provider, MD  nitroGLYCERIN (NITROSTAT) 0.4 MG SL tablet Place 1 tablet (0.4 mg total) under the tongue every 5 (five) minutes as needed for chest pain. 04/01/12   Burnell Blanks, MD  Tamsulosin HCl (FLOMAX) 0.4 MG CAPS Take 0.4 mg by mouth daily after supper.  04/21/11   Regina J Roczniak, PA-C   BP 155/69  Pulse 107  Temp(Src) 99.7 F (37.6 C) (Oral)  Resp 23  Ht 5\' 11"  (1.803 m)   Wt 325 lb (147.419 kg)  BMI 45.35 kg/m2  SpO2 95% Physical Exam  Vitals reviewed. Constitutional: He is oriented to person, place, and time. He appears well-developed and well-nourished. No distress.  NAD, pleasant   HENT:  Head: Normocephalic and atraumatic.  Mouth/Throat: Oropharynx is clear and moist. No oropharyngeal exudate.  Eyes: Conjunctivae and EOM are normal. Pupils are equal, round, and reactive to light. Right eye exhibits no discharge. Left eye exhibits no discharge. No scleral icterus.  Neck: Normal range of motion. Neck supple.  Cardiovascular: Normal heart sounds and intact distal pulses.  Exam reveals no  gallop and no friction rub.   No murmur heard. Tachy reg  Pulmonary/Chest: Effort normal and breath sounds normal. No respiratory distress. He has no wheezes. He has no rales.  Abdominal: Soft. He exhibits no distension and no mass. There is no tenderness (no abd ttp, neg murphy, neg mcburney).  Genitourinary:  ttp of b/l testicles, no swelling, no mass, intact crem  Musculoskeletal: Normal range of motion.  Neurological: He is alert and oriented to person, place, and time. No cranial nerve deficit. He exhibits normal muscle tone. Coordination normal.  Skin: Skin is warm. No rash noted. He is not diaphoretic.    ED Course  Procedures (including critical care time) Labs Review Labs Reviewed  CBC WITH DIFFERENTIAL - Abnormal; Notable for the following:    WBC 14.7 (*)    Neutrophils Relative % 85 (*)    Neutro Abs 12.5 (*)    Lymphocytes Relative 7 (*)    Monocytes Absolute 1.1 (*)    All other components within normal limits  COMPREHENSIVE METABOLIC PANEL - Abnormal; Notable for the following:    Sodium 131 (*)    Chloride 94 (*)    Glucose, Bld 164 (*)    Albumin 3.4 (*)    Anion gap 18 (*)    All other components within normal limits  URINALYSIS, ROUTINE W REFLEX MICROSCOPIC - Abnormal; Notable for the following:    APPearance HAZY (*)    Hgb urine  dipstick LARGE (*)    Protein, ur 30 (*)    Leukocytes, UA MODERATE (*)    All other components within normal limits  URINE MICROSCOPIC-ADD ON - Abnormal; Notable for the following:    Bacteria, UA FEW (*)    All other components within normal limits  I-STAT CG4 LACTIC ACID, ED - Abnormal; Notable for the following:    Lactic Acid, Venous 4.35 (*)    All other components within normal limits  I-STAT CG4 LACTIC ACID, ED - Abnormal; Notable for the following:    Lactic Acid, Venous 3.11 (*)    All other components within normal limits  URINE CULTURE  CULTURE, BLOOD (ROUTINE X 2)  CULTURE, BLOOD (ROUTINE X 2)  BLOOD GAS, VENOUS    Imaging Review US Scrotum  01/20/2014   CLINICAL DATA:  Testicle pain.  EXAM: SCROTAL ULTRASOUND  DOPPLER ULTRASOUND OF THE TESTICLES  TECHNIQUE: Complete ultrasound examination of the testicles, epididymis, and other scrotal structures was performed. Color and spectral Doppler ultrasound were also utilized to evaluate blood flow to the testicles.  COMPARISON:  CT of the abdomen and pelvis November 04, 2013  FINDINGS: Right testicle  Measurements: 4.7 x 2.6 x 3 cm. No mass identified. A few scattered microlithiasis.  Left testicle  Measurements: 4.8 x 3.3 x 3.2 cm. Somewhat heterogeneous in density, with increased vascularity. A few scattered microlithiasis.  Right epididymis: Normal in size and appearance. RIGHT scrotal pleural.  Left epididymis:  Enlarged, heterogeneous and hypervascular.  Hydrocele:  Small bilateral hydroceles, LEFT greater than RIGHT.  Varicocele:  None visualized.  Pulsed Doppler interrogation of both testes demonstrates low resistance arterial and venous waveforms bilaterally.  IMPRESSION: LEFT epididymo-orchitis.  Bilateral scattered testicular microlithiasis: Current literature suggests that testicular microlithiasis is not a significant independent risk factor for development of testicular carcinoma, and that follow up imaging is not warranted in the  absence of other risk factors. Monthly testicular self-examination and annual physical exams are considered appropriate surveillance. If patient has other risk factors for testicular carcinoma,  then referral to Urology should be considered. (Reference: DeCastro, et al.: A 5-Year Follow up Study of Asymptomatic Men with Testicular Microlithiasis. J Urol 2008; 301:6010-9323.)   Electronically Signed   By: Elon Alas   On: 01/20/2014 19:43   [place] Art/ven Flow Abd Pelv Doppler  01/20/2014   CLINICAL DATA:  Testicle pain.  EXAM: SCROTAL ULTRASOUND  DOPPLER ULTRASOUND OF THE TESTICLES  TECHNIQUE: Complete ultrasound examination of the testicles, epididymis, and other scrotal structures was performed. Color and spectral Doppler ultrasound were also utilized to evaluate blood flow to the testicles.  COMPARISON:  CT of the abdomen and pelvis November 04, 2013  FINDINGS: Right testicle  Measurements: 4.7 x 2.6 x 3 cm. No mass identified. A few scattered microlithiasis.  Left testicle  Measurements: 4.8 x 3.3 x 3.2 cm. Somewhat heterogeneous in density, with increased vascularity. A few scattered microlithiasis.  Right epididymis: Normal in size and appearance. RIGHT scrotal pleural.  Left epididymis:  Enlarged, heterogeneous and hypervascular.  Hydrocele:  Small bilateral hydroceles, LEFT greater than RIGHT.  Varicocele:  None visualized.  Pulsed Doppler interrogation of both testes demonstrates low resistance arterial and venous waveforms bilaterally.  IMPRESSION: LEFT epididymo-orchitis.  Bilateral scattered testicular microlithiasis: Current literature suggests that testicular microlithiasis is not a significant independent risk factor for development of testicular carcinoma, and that follow up imaging is not warranted in the absence of other risk factors. Monthly testicular self-examination and annual physical exams are considered appropriate surveillance. If patient has other risk factors for testicular carcinoma,  then referral to Urology should be considered. (Reference: DeCastro, et al.: A 5-Year Follow up Study of Asymptomatic Men with Testicular Microlithiasis. J Urol 2008; 557:3220-2542.)   Electronically Signed   By: Elon Alas   On: 01/20/2014 19:43   Dg Chest Portable 1 View  01/20/2014   CLINICAL DATA:  Fever, testicular pain  EXAM: PORTABLE CHEST - 1 VIEW  COMPARISON:  Portable exam 1752 hr. Prior exams will not display in PACs at this time for comparison.  FINDINGS: Enlargement of cardiac silhouette.  Mediastinal contours and pulmonary vascularity normal.  Lungs clear.  No pleural effusion or pneumothorax.  No acute osseous findings.  IMPRESSION: Enlargement of cardiac silhouette.  No acute abnormalities.   Electronically Signed   By: Lavonia Dana M.D.   On: 01/20/2014 18:36     EKG Interpretation None      MDM   MDM: 63 y.o. WM w/ PMHx of Obesity, hx of TURP 1 week ago by Urology at [facility] w/ cc: of testicle pain and fever x 1 day. Pt states that he has been hurting for 1 day in testicles. Fever to 103 at home. No n/v/d. No abd pain. No chest pain, SOB. Here febrile, mildly tachy. GCS 15, BP fine. Normal O2. Sepsis code called. Bolus given, labs, urine. With concern for abscess of scrotum, u/s obtained. Lactic Acid 4. Bolus given, pt remained stable. Lactic improved with bolus. U/s shows orchitis, no abscess. UA shows UTI. Labs with leukocytosis. Vanc and Cefepime given for broad spec. After workup, pt stable, Urology consulted. Urology saw patient recommend medical admission. Medicine saw patient, and patient then requested transfer to [facility] to be closer to his own Urologist. Explained risks and benefits of transfer. Accepting physician Deatra Canter at [facility]. Transfer to [facility].   Final diagnoses:  Severe sepsis  Orchitis  Acute cystitis without hematuria    Transfer to [place], MD 01/20/14 2320

## 2014-01-20 NOTE — ED Notes (Signed)
Patient transported to Ultrasound 

## 2014-01-20 NOTE — ED Notes (Signed)
Dr. Jenkins at bedside. 

## 2014-01-20 NOTE — Progress Notes (Signed)
ANTIBIOTIC CONSULT NOTE - INITIAL  Pharmacy Consult for cefepime Indication: rule out sepsis  Allergies  Allergen Reactions  . Aspirin     Daily dose upsets stomach  . Statins Hives and Rash    Patient Measurements: Height: 5\' 11"  (180.3 cm) Weight: 325 lb (147.419 kg) IBW/kg (Calculated) : 75.3  Vital Signs: Temp: 103.1 F (39.5 C) (09/25 1737) Temp src: Oral (09/25 1737) BP: 137/69 mmHg (09/25 1737) Pulse Rate: 107 (09/25 1737) Intake/Output from previous day:   Intake/Output from this shift:    Labs:  Recent Labs  01/20/14 1825  WBC 14.7*  HGB 14.2  PLT 185   Estimated Creatinine Clearance: 112.1 ml/min (by C-G formula based on Cr of 0.98). No results found for this basename: VANCOTROUGH, VANCOPEAK, VANCORANDOM, GENTTROUGH, GENTPEAK, GENTRANDOM, TOBRATROUGH, TOBRAPEAK, TOBRARND, AMIKACINPEAK, AMIKACINTROU, AMIKACIN,  in the last 72 hours   Microbiology: No results found for this or any previous visit (from the past 720 hour(s)).  Medical History: Past Medical History  Diagnosis Date  . Arteriosclerotic cardiovascular disease (ASCVD) 2000 he    MI in 1999; PTCA of PDA and 2nd marginal in 3/00  . AAA (abdominal aortic aneurysm) 2007    stent graft repair 12/07  . Laryngeal carcinoma 2010    resection and radiation therapy 2010-11  . Diverticulitis 2006  . Obesity     BMI 47; lap band surgery 2010  . Gastroesophageal reflux disease     Hiatal hernia; previously treated with PPI  . Tobacco abuse, in remission     40 pack years; discontinued in 2000  . Cerebrovascular disease   . Cholelithiasis 11/28/2010  . Elevated PSA 06/2011  . Sleep apnea     BiPAP mask utilized        2 yrs sleep study    dr Redmond Pulling  . Hypertension     Mild; controlled with a single agent       dr Lattie Haw      cardiac md  . Cough, persistent   . Popliteal aneurysm     popliteal bypass-2013   Assessment: 40 YOM presented with fever (103.1), tachycardia and leukocytosis. Pharmacy  is consulted to start cefepime for r/o sepsis. MD also ordered a loading dose of vancomycin 2g x 1. Urine and blood cultures are pending. BMET in process, scr wnl 2 months ago.  Goal of Therapy:  Appropriate antibiotics dosing  Plan:  - Cefepime 1g IV Q 8 hrs - f/u renal function and cultures - f/u plan for continue vancomycin after admission  Maryanna Shape, PharmD, BCPS  Clinical Pharmacist  Pager: [phone]   01/20/2014,6:54 PM

## 2014-01-22 LAB — URINE CULTURE: Colony Count: >=100000

## 2014-01-27 LAB — CULTURE, BLOOD (ROUTINE X 2)
CULTURE: NO GROWTH
Culture: NO GROWTH

## 2014-02-07 ENCOUNTER — Other Ambulatory Visit: Payer: Self-pay | Admitting: Cardiovascular Disease

## 2014-04-06 ENCOUNTER — Encounter (HOSPITAL_COMMUNITY): Payer: Self-pay | Admitting: Cardiovascular Disease

## 2014-04-07 ENCOUNTER — Ambulatory Visit (INDEPENDENT_AMBULATORY_CARE_PROVIDER_SITE_OTHER): Payer: Medicare Other | Admitting: Nurse Practitioner

## 2014-04-07 ENCOUNTER — Encounter: Payer: Self-pay | Admitting: Nurse Practitioner

## 2014-04-07 VITALS — BP 160/72 | HR 87 | Ht 71.0 in | Wt 334.0 lb

## 2014-04-07 DIAGNOSIS — I25119 Atherosclerotic heart disease of native coronary artery with unspecified angina pectoris: Secondary | ICD-10-CM

## 2014-04-07 DIAGNOSIS — R079 Chest pain, unspecified: Secondary | ICD-10-CM

## 2014-04-07 DIAGNOSIS — I10 Essential (primary) hypertension: Secondary | ICD-10-CM

## 2014-04-07 DIAGNOSIS — E785 Hyperlipidemia, unspecified: Secondary | ICD-10-CM

## 2014-04-07 MED ORDER — AMLODIPINE BESYLATE 5 MG PO TABS: 5.0000 mg | ORAL_TABLET | Freq: Every day | ORAL | Status: DC

## 2014-04-07 NOTE — Progress Notes (Signed)
Patient Name: Mathew Padilla Date of Encounter: 04/07/2014  Primary Care Provider:  Alonza Bogus, MD Primary Cardiologist:  C. Angelena Form, MD   Patient Profile  63 y/o male with a h/o CAD who presents today for evaluation of c/p.  Problem List   Past Medical History  Diagnosis Date  . Arteriosclerotic cardiovascular disease (ASCVD)     a. MI in 1999;  b.  PTCA of PDA and 2nd marginal in 3/00;  c. 02/2012 Cath: LM nl, LAD 52m, LCX 23m, OM small, 70, RCA 99p/14m-->Med Rx.  Marland Kitchen AAA (abdominal aortic aneurysm) 2007    stent graft repair 12/07  . Laryngeal carcinoma 2010    resection and radiation therapy 2010-11  . Diverticulitis 2006  . Obesity     BMI 47; lap band surgery 2010  . Gastroesophageal reflux disease     Hiatal hernia; previously treated with PPI  . Tobacco abuse, in remission     40 pack years; discontinued in 2000  . Cerebrovascular disease   . Cholelithiasis 11/28/2010  . Elevated PSA 06/2011    a. s/p TURP 01/2014.  Marland Kitchen Sleep apnea     BiPAP mask utilized        2 yrs sleep study    dr Redmond Pulling  . Hypertension     Mild; controlled with a single agent       dr Lattie Haw      cardiac md  . Cough, persistent   . Popliteal aneurysm     popliteal bypass-2013   Past Surgical History  Procedure Laterality Date  . Peripheral arterial stent graft  2007    for abdominal aortic aneurysm  . Larynx surgery  2010    Resection of carcinoma  . Open anterior shoulder reconstruction  2011    Left  . Laparoscopic gastric banding  2010  . Eye surgery    . Cataract extraction    . Colonoscopy  10/2007  . Femoral-femoral bypass graft  04/18/2011    Procedure: BYPASS GRAFT FEMORAL-FEMORAL ARTERY;  Surgeon: Theotis Burrow, MD;  Location: [facility] OR;  Service: Vascular;  Laterality: Bilateral;  Left to right femoral to femoral bypass   . Femoral-popliteal bypass graft  04/18/2011    Procedure: BYPASS GRAFT FEMORAL-POPLITEAL ARTERY;  Surgeon: Theotis Burrow, MD;  Location: [facility] OR;   Service: Vascular;  Laterality: Right;  Right femoral popliteal bypass with propaten gortex graft  . Cardiac catheterization  2000    PCI-stent  . Pr vein bypass graft,aorto-fem-pop  07-23-11    Left AK to BK popliteal BPG using rev. saphenous vein  . Colonoscopy with esophagogastroduodenoscopy (egd)  2009    RMR: He had distal esophageal erosions, patulous GE junction, erosive reflux esophagitis, hiatal hernia, lipoma on the angularis not manipulated, left-sided diverticula.  . Left heart catheterization with coronary angiogram N/A 03/10/2012    Procedure: LEFT HEART CATHETERIZATION WITH CORONARY ANGIOGRAM;  Surgeon: Burnell Blanks, MD;  Location: [facility] CATH LAB;  Service: Cardiovascular;  Laterality: N/A;    Allergies  Allergies  Allergen Reactions  . Aspirin     Other reaction(s): GI Upset (intolerance) Cannot take daily, just once in a while Daily dose upsets stomach  . Other Rash    Beta-blockers  . Statins Hives and Rash    HPI  63 y/o male with a h/o CAD s/p prior stenting of the PDA and OM2.  His last cath was in 02/2012 following an abnl MV showing moderate sized scar of moderate severity  involving the mid-inferior, basal inferior, mid-inferolateral and basal inferolateral segments with minimal reversibility.  Cath showed an occluded RCA and he has been medically managed since.  More recently, he underwent TURP in October @ [facility].  He tolerated surgery well w/o cardiac complications.  Since his surgery, his activity levels have declined some.  He's been dealing with chest congestion and cough over the past 2 wks and recently saw his PCP and was placed on abx.  In the setting of chest congestion, he says that he's had a constant bilateral chest pressure w/o associated Ss that never really goes away but seems to be getting better since starting on the abx.  C/P is not similar to prior angina.  He now plans to pursue a lap band surgery and wishes to start the process of  cardiac clearance.  Home Medications  Prior to Admission medications   Medication Sig Start Date End Date Taking? Authorizing Provider  cephALEXin (KEFLEX) 500 MG capsule Take 500 mg by mouth 3 (three) times daily. 1 CAP TID X'S 7 DAYS   Yes Historical Provider, MD  furosemide (LASIX) 40 MG tablet Take 20 mg by mouth daily.   Yes Historical Provider, MD  gabapentin (NEURONTIN) 400 MG capsule Take 400 mg by mouth 4 (four) times daily.   Yes Historical Provider, MD  irbesartan (AVAPRO) 300 MG tablet Take 300 mg by mouth daily.   Yes Historical Provider, MD  KLOR-CON M20 20 MEQ tablet TAKE 1 TABLET (20 MEQ TOTAL) BY MOUTH DAILY. 02/08/14  Yes Burnell Blanks, MD  lubiprostone (AMITIZA) 8 MCG capsule Take 8 mcg by mouth 2 (two) times daily with a meal.   Yes Historical Provider, MD  metFORMIN (GLUCOPHAGE-XR) 500 MG 24 hr tablet Take 500 mg by mouth 2 (two) times daily.   Yes Historical Provider, MD  Tamsulosin HCl (FLOMAX) 0.4 MG CAPS Take 0.4 mg by mouth daily after supper.  04/21/11  Yes Regina J Roczniak, PA-C  amLODipine (NORVASC) 5 MG tablet Take 1 tablet (5 mg total) by mouth daily. 04/07/14   Rogelia Mire, NP  ibuprofen (ADVIL,MOTRIN) 200 MG tablet Take 200 mg by mouth daily as needed for mild pain or moderate pain.     Historical Provider, MD  nitroGLYCERIN (NITROSTAT) 0.4 MG SL tablet Place 1 tablet (0.4 mg total) under the tongue every 5 (five) minutes as needed for chest pain. Patient not taking: Reported on 04/07/2014 04/01/12   Burnell Blanks, MD  He takes ASA 81mg  about three x/wk.  He says that if he takes it daily he develops stomach upset.  Review of Systems  Chest pain, congestion, and cough as above.  He denies chest pain, palpitations, dyspnea, pnd, orthopnea, n, v, dizziness, syncope, edema, weight gain, or early satiety.  All other systems reviewed and are otherwise negative except as noted above.  Physical Exam  Blood pressure 160/72, pulse 87, height  5\' 11"  (1.803 m), weight 334 lb (151.501 kg).  General: Pleasant, NAD Psych: Normal affect. Neuro: Alert and oriented X 3. Moves all extremities spontaneously. HEENT: Normal  Neck: Supple without bruits or JVD. Lungs:  Resp regular and unlabored, CTA. Heart: RRR no s3, s4, or murmurs. Abdomen: Soft, non-tender, non-distended, BS + x 4.  Extremities: No clubbing, cyanosis or edema. DP/PT/Radials 2+ and equal bilaterally.  Accessory Clinical Findings  ECG - RSR, 87, freq pvc's, rightward axis - no acute st/t changes.  Assessment & Plan  1.  Chest pain/CAD:  Pt presents with  a 1-2 wk h/o persistent bilateral chest pressure w/o associated Ss in the setting of chest congestion and cough, which is now improving after starting abx therapy.  He has not had any discomfort, which he would identify as being similar to his previous angina.  As he is planning to pursue a lap band, in the setting of this chest pain and since it's been three years since his last ischemic evaluation, I will arrange for a lexiscan myoview to r/o ischemia.  Cont ASA (takes 3 x/wk), ARB.  He is intolerant to statins and beta blockers.  2.  HTN:  BP elevated today.  He says it typically runs 150-160 mmHg @ home.  He is only on lasix and avapro.  I will add norvasc 5mg  daily today.  I would have liked to have added coreg in the setting of HTN, HR of 87, and PVC's but he is intolerant to bb (fatigue/bradycardia).  3.  HL:  Intolerant to statins.  Consider referral to lipid clinic for consideration of PCSK9 inhibitor therapy.  4.  DM:  On glucophage per PCP.  5.  Morbid Obesity:  S/p gastric sleeve.  Now considering lap band.  6.  Dispo:  MV as above.  F/U with Dr. Angelena Form in 3 mos or sooner if necessary.    Murray Hodgkins, NP 04/07/2014, 4:36 PM

## 2014-04-07 NOTE — Patient Instructions (Signed)
START AMLODIPINE 5 MG DAILY  Your physician has requested that you have a lexiscan myoview. For further information please visit [email or web address]. Please follow instruction sheet, as given.  Your physician recommends that you schedule a follow-up appointment in: [place]

## 2014-04-10 ENCOUNTER — Other Ambulatory Visit: Payer: Self-pay | Admitting: Cardiovascular Disease

## 2014-04-10 ENCOUNTER — Other Ambulatory Visit: Payer: Self-pay

## 2014-04-13 ENCOUNTER — Ambulatory Visit (HOSPITAL_COMMUNITY): Payer: Medicare Other | Attending: Nurse Practitioner | Admitting: Radiology

## 2014-04-13 DIAGNOSIS — E119 Type 2 diabetes mellitus without complications: Secondary | ICD-10-CM | POA: Diagnosis not present

## 2014-04-13 DIAGNOSIS — Z09 Encounter for follow-up examination after completed treatment for conditions other than malignant neoplasm: Secondary | ICD-10-CM | POA: Insufficient documentation

## 2014-04-13 DIAGNOSIS — I10 Essential (primary) hypertension: Secondary | ICD-10-CM | POA: Insufficient documentation

## 2014-04-13 DIAGNOSIS — I2510 Atherosclerotic heart disease of native coronary artery without angina pectoris: Secondary | ICD-10-CM | POA: Diagnosis not present

## 2014-04-13 DIAGNOSIS — Z6841 Body Mass Index (BMI) 40.0 and over, adult: Secondary | ICD-10-CM | POA: Diagnosis not present

## 2014-04-13 DIAGNOSIS — R079 Chest pain, unspecified: Secondary | ICD-10-CM | POA: Diagnosis not present

## 2014-04-13 DIAGNOSIS — Z95828 Presence of other vascular implants and grafts: Secondary | ICD-10-CM | POA: Insufficient documentation

## 2014-04-13 DIAGNOSIS — R0602 Shortness of breath: Secondary | ICD-10-CM | POA: Diagnosis not present

## 2014-04-13 DIAGNOSIS — I482 Chronic atrial fibrillation: Secondary | ICD-10-CM

## 2014-04-13 DIAGNOSIS — R0600 Dyspnea, unspecified: Secondary | ICD-10-CM | POA: Diagnosis not present

## 2014-04-13 MED ORDER — TECHNETIUM TC 99M SESTAMIBI GENERIC - CARDIOLITE
33.0000 | Freq: Once | INTRAVENOUS | Status: AC | PRN
  Administered 2014-04-13: 33 via INTRAVENOUS

## 2014-04-13 MED ORDER — REGADENOSON 0.4 MG/5ML IV SOLN
0.4000 mg | Freq: Once | INTRAVENOUS | Status: AC
  Administered 2014-04-13: 0.4 mg via INTRAVENOUS

## 2014-04-13 NOTE — Progress Notes (Signed)
[facility] [address] [place], [place] [address] [phone]    Cardiology Nuclear Med Study  Izac Faulkenberry Carrozza is a 63 y.o. male     MRN : [id number]     DOB: 12-28-1950  Procedure Date: 04/13/2014  Nuclear Med Background Indication for Stress Test:  Evaluation for Ischemia and Stent Patency History: CAD, Stent, and 02-18-2012 Myocardial Perfusion Imaging-[facility], EF=38% Cardiac Risk Factors: Hypertension and NIDDM  Symptoms: Chest Pressure/Tightness with/without exertion (last occurrence at present 1/10),DOE and SOB   Nuclear Pre-Procedure Caffeine/Decaff Intake:  None> 12 hrs NPO After: 11:00pm   Lungs:  clear O2 Sat: 96% on room air. IV 0.9% NS with Angio Cath:  20g  IV Site: R Antecubital x 1, tolerated well IV Started by:  Irven Baltimore, RN  Chest Size (in):  56 Cup Size: n/a  Height: 5\' 11"  (1.803 m)  Weight:  340 lb (154.223 kg)  BMI:  Body mass index is 47.44 kg/(m^2). Tech Comments:  Patient held Metformin this am. Irven Baltimore, RN    Nuclear Med Study 1 or 2 day study: 2 day  Stress Test Type:  Carlton Adam  Reading MD: N/A  Order Authorizing Provider:  Lauree Chandler, MD, and Murray Hodgkins, NP  Resting Radionuclide: Technetium 18m Sestamibi  Resting Radionuclide Dose: 33.0 mCi   On      04-19-14  Stress Radionuclide:  Technetium 46m Sestamibi  Stress Radionuclide Dose: 33.0 mCi  On          04-13-14          Stress Protocol Rest HR: 71 Stress HR: 85  Rest BP: 131/80 Stress BP: 135/80  Exercise Time (min): n/a METS: n/a   Predicted Max HR: 156 bpm % Max HR: 54.49 bpm Rate Pressure Product: 11475   Dose of Adenosine (mg):  n/a Dose of Lexiscan: 0.4 mg  Dose of Atropine (mg): n/a Dose of Dobutamine: n/a mcg/kg/min (at max HR)  Stress Test Technologist: Irven Baltimore, RN  Nuclear Technologist:  Earl Many, CNMT     Rest Procedure:  Myocardial perfusion imaging was performed at rest 45 minutes following the  intravenous administration of Technetium 45m Sestamibi. Rest ECG: NSR, PVCs  Stress Procedure:  The patient received IV Lexiscan 0.4 mg over 15-seconds.  Technetium 75m Sestamibi injected at 30-seconds.  The patient had baseline Chest Tightness 1/10, but no change with Lexiscan.Quantitative spect images were obtained after a 45 minute delay. Stress ECG: No significant ST segment change suggestive of ischemia.  QPS Raw Data Images:  Acquisition technically good; LVE. Stress Images:  There is decreased uptake in the inferior lateral wall and apex. Rest Images:  There is decreased uptake in the inferior lateral wall. Subtraction (SDS):  These findings are consistent with prior infarct and mild peri-infarct ischemia. Transient Ischemic Dilatation (Normal <1.22):  1.08 Lung/Heart Ratio (Normal <0.45):  0.42  Quantitative Gated Spect Images QGS EDV:  230 ml QGS ESV:  156 ml  Impression Exercise Capacity:  Lexiscan with no exercise. BP Response:  Normal blood pressure response. Clinical Symptoms:  No chest pain or dyspnea. ECG Impression:  No significant ST segment change suggestive of ischemia. Comparison with Prior Nuclear Study: Compared to 02/26/12, no significant change.  Overall Impression:  High risk stress nuclear study with a large, severe, partially reversible inferior lateral defect consistent with prior infarct and mild peri-infarct ischemia towards the apex. Study high risk due to reduced LV function.  LV Ejection Fraction: 32%.  LV  Wall Motion:  Global hypokinesis with akinesis of the inferior lateral wall.  Kirk Ruths

## 2014-04-17 ENCOUNTER — Encounter (HOSPITAL_COMMUNITY): Payer: Medicare Other

## 2014-04-19 ENCOUNTER — Other Ambulatory Visit: Payer: Self-pay | Admitting: Cardiovascular Disease

## 2014-04-19 ENCOUNTER — Ambulatory Visit (HOSPITAL_COMMUNITY): Payer: Medicare Other | Attending: Cardiovascular Disease

## 2014-04-19 DIAGNOSIS — R0989 Other specified symptoms and signs involving the circulatory and respiratory systems: Secondary | ICD-10-CM

## 2014-04-19 MED ORDER — TECHNETIUM TC 99M SESTAMIBI GENERIC - CARDIOLITE
33.0000 | Freq: Once | INTRAVENOUS | Status: AC | PRN
  Administered 2014-04-19: 33 via INTRAVENOUS

## 2014-04-19 NOTE — Telephone Encounter (Signed)
Rogelia Mire, NP at 04/07/2014 4:36 PM  KLOR-CON M20 20 MEQ tablet TAKE 1 TABLET (20 MEQ TOTAL) BY MOUTH DAILY.   Patient Instructions     START AMLODIPINE 5 MG DAILY  Your physician has requested that you have a lexiscan myoview. For further information please visit [email or web address]. Please follow instruction sheet, as given.  Your physician recommends that you schedule a follow-up appointment in: [place]

## 2014-04-20 ENCOUNTER — Other Ambulatory Visit: Payer: Self-pay | Admitting: *Deleted

## 2014-04-20 DIAGNOSIS — R9439 Abnormal result of other cardiovascular function study: Secondary | ICD-10-CM

## 2014-04-25 ENCOUNTER — Ambulatory Visit (HOSPITAL_COMMUNITY): Payer: Medicare Other | Attending: Cardiovascular Disease | Admitting: Radiology

## 2014-04-25 DIAGNOSIS — R9439 Abnormal result of other cardiovascular function study: Secondary | ICD-10-CM | POA: Diagnosis not present

## 2014-04-25 DIAGNOSIS — E785 Hyperlipidemia, unspecified: Secondary | ICD-10-CM | POA: Diagnosis not present

## 2014-04-25 DIAGNOSIS — E119 Type 2 diabetes mellitus without complications: Secondary | ICD-10-CM | POA: Diagnosis not present

## 2014-04-25 DIAGNOSIS — I10 Essential (primary) hypertension: Secondary | ICD-10-CM | POA: Diagnosis not present

## 2014-04-25 DIAGNOSIS — Z87891 Personal history of nicotine dependence: Secondary | ICD-10-CM | POA: Insufficient documentation

## 2014-04-25 NOTE — Progress Notes (Signed)
Echocardiogram performed.  

## 2014-05-01 DIAGNOSIS — E119 Type 2 diabetes mellitus without complications: Secondary | ICD-10-CM | POA: Diagnosis not present

## 2014-05-04 ENCOUNTER — Encounter: Payer: Self-pay | Admitting: Nurse Practitioner

## 2014-05-04 ENCOUNTER — Ambulatory Visit (INDEPENDENT_AMBULATORY_CARE_PROVIDER_SITE_OTHER): Payer: Medicare Other | Admitting: Nurse Practitioner

## 2014-05-04 VITALS — BP 148/82 | HR 68 | Ht 71.0 in | Wt 341.0 lb

## 2014-05-04 DIAGNOSIS — E669 Obesity, unspecified: Secondary | ICD-10-CM

## 2014-05-04 DIAGNOSIS — I10 Essential (primary) hypertension: Secondary | ICD-10-CM | POA: Diagnosis not present

## 2014-05-04 DIAGNOSIS — I2510 Atherosclerotic heart disease of native coronary artery without angina pectoris: Secondary | ICD-10-CM

## 2014-05-04 DIAGNOSIS — R600 Localized edema: Secondary | ICD-10-CM

## 2014-05-04 DIAGNOSIS — E785 Hyperlipidemia, unspecified: Secondary | ICD-10-CM

## 2014-05-04 MED ORDER — HYDRALAZINE HCL 25 MG PO TABS: 25.0000 mg | ORAL_TABLET | Freq: Three times a day (TID) | ORAL | Status: DC

## 2014-05-04 NOTE — Patient Instructions (Signed)
Your physician has recommended you make the following change in your medication:  STOP AMLODIPINE INCREASE LASIX ( 40 MG ) TWICE A DAY FOR ONE WEEK THAN GO BACK TO LASIX ( 40 MG ) DAILY INCREASE POTASSIUM ( 20 MEQ ) TWICE A DAY FOR ONE WEEK THAN GO BACK TO POTASSIUM ( 20 MEQ ) DAILY START HYDRALAZINE ( 25 MG ) 3 TIMES A DAY     Your physician recommends that you KEEP scheduled  follow-up appointment with Dr. Angelena Form   Check your bp at home and keep diary bring on next office visit with Dr. Angelena Form

## 2014-05-04 NOTE — Progress Notes (Signed)
Patient Name: Mathew Padilla Date of Encounter: 05/04/2014  Primary Care Provider:  Alonza Bogus, MD Primary Cardiologist:  C. Angelena Form, MD   Patient Profile  64 y/o male with h/o CAD and obesity pending gastric sleeve, who presents for f/u after recent testing.  Problem List   Past Medical History  Diagnosis Date  . Arteriosclerotic cardiovascular disease (ASCVD)     a. MI in 1999;  b.  PTCA of PDA and 2nd marginal in 3/00;  c. 02/2012 Cath: LM nl, LAD 36m, LCX 88m, OM small, 70, RCA 99p/15m-->Med Rx;  d. 03/2014 MV: EF 32%, inflat infarct w/ mild peri-infarct isch towards apex (similar to 2013 MV); d. 03/2014 Echo: EF 45-50%, Gr 1 DD, mildly dil LA.  Marland Kitchen AAA (abdominal aortic aneurysm) 2007    stent graft repair 12/07  . Laryngeal carcinoma 2010    resection and radiation therapy 2010-11  . Diverticulitis 2006  . Obesity     BMI 47; lap band surgery 2010  . Gastroesophageal reflux disease     Hiatal hernia; previously treated with PPI  . Tobacco abuse, in remission     40 pack years; discontinued in 2000  . Cerebrovascular disease   . Cholelithiasis 11/28/2010  . Elevated PSA 06/2011    a. s/p TURP 01/2014.  Marland Kitchen Sleep apnea     BiPAP mask utilized        2 yrs sleep study    dr Redmond Pulling  . Hypertension     Mild; controlled with a single agent    . Cough, persistent   . Popliteal aneurysm     popliteal bypass-2013   Past Surgical History  Procedure Laterality Date  . Peripheral arterial stent graft  2007    for abdominal aortic aneurysm  . Larynx surgery  2010    Resection of carcinoma  . Open anterior shoulder reconstruction  2011    Left  . Laparoscopic gastric banding  2010  . Eye surgery    . Cataract extraction    . Colonoscopy  10/2007  . Femoral-femoral bypass graft  04/18/2011    Procedure: BYPASS GRAFT FEMORAL-FEMORAL ARTERY;  Surgeon: Theotis Burrow, MD;  Location: [facility] OR;  Service: Vascular;  Laterality: Bilateral;  Left to right femoral to femoral bypass    . Femoral-popliteal bypass graft  04/18/2011    Procedure: BYPASS GRAFT FEMORAL-POPLITEAL ARTERY;  Surgeon: Theotis Burrow, MD;  Location: [facility] OR;  Service: Vascular;  Laterality: Right;  Right femoral popliteal bypass with propaten gortex graft  . Cardiac catheterization  2000    PCI-stent  . Pr vein bypass graft,aorto-fem-pop  07-23-11    Left AK to BK popliteal BPG using rev. saphenous vein  . Colonoscopy with esophagogastroduodenoscopy (egd)  2009    RMR: He had distal esophageal erosions, patulous GE junction, erosive reflux esophagitis, hiatal hernia, lipoma on the angularis not manipulated, left-sided diverticula.  . Left heart catheterization with coronary angiogram N/A 03/10/2012    Procedure: LEFT HEART CATHETERIZATION WITH CORONARY ANGIOGRAM;  Surgeon: Burnell Blanks, MD;  Location: [facility] CATH LAB;  Service: Cardiovascular;  Laterality: N/A;    Allergies  Allergies  Allergen Reactions  . Aspirin     Other reaction(s): GI Upset (intolerance) Cannot take daily, just once in a while Daily dose upsets stomach  . Other Rash    Beta-blockers  . Statins Hives and Rash    HPI  64 y/o male with a h/o CAD s/p prior stenting of the PDA  and OM2. His last cath was in 02/2012 following an abnl MV showing moderate sized scar of moderate severity involving the mid-inferior, basal inferior, mid-inferolateral and basal inferolateral segments with minimal reversibility. Cath showed an occluded RCA and he has been medically managed since. I last saw him in clinic on 12/11 @ which time he was having some atypical chest pain in the setting of chest congestion that was clearing following initiation of abx.  He was seeking preoperative cardiac risk assessment in the setting of pending gastric sleeve surgery as he gained all of his wt back following lap band several years ago.  He was hypertensive during that visit and I added amlodipine 5 mg and also ordered a lexiscan MV to r/o ischemia in  setting of c/p and need for cardiac clearance.  MV showed inferolateral infarct with mild peri-infarct ischemia, which was similar to findings in 2013.  EF was 32 % - which was down since last echo.  I repeated echo and this showed an EF of 45-50 %.  Since his last visit, he has done reasonably well.  He has not had any further chest pain.  He has been trying to increase his activity levels @ home and has not had any significant DOE.  He does not weigh himself regularly.  Since starting amlodipine, his BP's have remained in the 150's @ home.  He has also noted increased swelling in his ankles and feet.  He denies palpitations, pnd, orthopnea, n, v, dizziness, syncope, or early satiety.   Home Medications  Prior to Admission medications   Medication Sig Start Date End Date Taking? Authorizing Provider  furosemide (LASIX) 40 MG tablet TAKE 1 TABLET (40 MG TOTAL) BY MOUTH DAILY. 04/10/14  Yes Burnell Blanks, MD  gabapentin (NEURONTIN) 400 MG capsule Take 400 mg by mouth 4 (four) times daily.   Yes Historical Provider, MD  ibuprofen (ADVIL,MOTRIN) 200 MG tablet Take 200 mg by mouth daily as needed for mild pain or moderate pain.    Yes Historical Provider, MD  irbesartan (AVAPRO) 300 MG tablet Take 300 mg by mouth daily.   Yes Historical Provider, MD  KLOR-CON M20 20 MEQ tablet TAKE 1 TABLET (20 MEQ TOTAL) BY MOUTH DAILY. 04/19/14  Yes Burnell Blanks, MD  metFORMIN (GLUCOPHAGE-XR) 500 MG 24 hr tablet Take 500 mg by mouth 2 (two) times daily.   Yes Historical Provider, MD  nitroGLYCERIN (NITROSTAT) 0.4 MG SL tablet Place 1 tablet (0.4 mg total) under the tongue every 5 (five) minutes as needed for chest pain. 04/01/12  Yes Burnell Blanks, MD  Tamsulosin HCl (FLOMAX) 0.4 MG CAPS Take 0.4 mg by mouth daily after supper.  04/21/11  Yes Regina J Roczniak, PA-C  hydrALAZINE (APRESOLINE) 25 MG tablet Take 1 tablet (25 mg total) by mouth 3 (three) times daily. 05/04/14   Rogelia Mire,  NP   **He takes ASA 81mg  about three x/wk. He says that if he takes it daily he develops stomach upset.  Review of Systems  Lower extremity swelling as above.  All other systems reviewed and are otherwise negative except as noted above.  Physical Exam  Blood pressure 148/82, pulse 68, height 5\' 11"  (1.803 m), weight 341 lb (154.677 kg), SpO2 97 %.  General: Pleasant, NAD Psych: Normal affect. Neuro: Alert and oriented X 3. Moves all extremities spontaneously. HEENT: Normal  Neck: Supple without bruits.  Obese - difficult to assess jvp. Lungs:  Resp regular and unlabored, CTA. Heart: RRR  no s3, s4, or murmurs. Abdomen: Firm, protuberant, non-tender, BS + x 4.  Extremities: No clubbing, cyanosis.  1+ bilat LE edema to the knees. DP/PT/Radials 1+ and equal bilaterally.  Accessory Clinical Findings  None  Assessment & Plan  1.  Chest Pain/CAD:  S/P recent myoview, which was read out as high risk 2/2 EF of 32 %, though imaging showed stable inflat infarct with peri-infarct ischemia.  He has known occlusion of the RCA.  F/U echo showed EF of 45-50 %.  I discussed his case with Dr. Angelena Form  No further ischemic eval.  He's not been having chest pain or dyspnea.  He is intolerant to statins and bb.  He only tolerates ASA 3 x /wk.  2.  HTN:  Amlodipine 5 mg added on last visit and BP has continued to trend in the 150's @ home.  Since adding amlodipine he has noted increased foot and ankle swelling.  He does have mild bilat LE edema today.  I've d/c'd amlodipine and have advised that he may take lasix 40 mg bid over the next week (while doubling up on KCl as well) but that he should return to 40 mg daily next week.  For his BP, I have Rx hydralazine 25 mg TID.  He is already on max dose ARB and is intolerant to BB and now amlodipine.  I've asked him to weigh himself @ home and also check his BP daily and report elevations above 140 to [place] so that we may adjust his hydralazine if necessary.  3.   Lower extremity edema:  This is likely 2/2 amlodipine therapy. Will d/c.  As above, I've advised that he may double on lasix for the next 7 days.  4.   HL:  Intolerant to statins.  Consider referral to lipid clinic for PCSK9 inhibitor.  5.  DM:  On metformin per PCP.  6.  Morbid obesity:  S/p lap band with regaining of weight.  He is now interested in a gastric sleeve.  From a cardiac standpoint, he would be low risk for a cardiac complication.  7.  Dispo:  F/U with Dr. Angelena Form as scheduled in March, or sooner if necessary.   Murray Hodgkins, NP 05/04/2014, 10:43 AM

## 2014-05-15 ENCOUNTER — Telehealth: Payer: Self-pay | Admitting: Cardiovascular Disease

## 2014-05-15 NOTE — Telephone Encounter (Signed)
Spoke with pt about blood pressure reading. He was in the office on 05/04/14 & HTN medication was changed. He has been taking the Apresoline 25mg  three times a day for about [redacted] week along with his other medications. Tolerating it well according to pt  Current bp today 145/63  No other reading b/c states he just got the cuff yesterday. He will call back with bp readings & if remains >237S systolic ( per office note)  Horton Chin RN

## 2014-05-15 NOTE — Telephone Encounter (Signed)
New Msg       Pt calling to see what is considered a normal bp reading because he has concerns with his.     Please return pt call.

## 2014-05-16 NOTE — Telephone Encounter (Signed)
Agree. cdm 

## 2014-05-18 ENCOUNTER — Telehealth: Payer: Self-pay | Admitting: Cardiovascular Disease

## 2014-05-18 DIAGNOSIS — R918 Other nonspecific abnormal finding of lung field: Secondary | ICD-10-CM | POA: Diagnosis not present

## 2014-05-18 DIAGNOSIS — I2510 Atherosclerotic heart disease of native coronary artery without angina pectoris: Secondary | ICD-10-CM | POA: Diagnosis not present

## 2014-05-18 DIAGNOSIS — Z6841 Body Mass Index (BMI) 40.0 and over, adult: Secondary | ICD-10-CM | POA: Diagnosis not present

## 2014-05-18 NOTE — Telephone Encounter (Signed)
Left message for patient to call back. It is after 5:00 pm. Informed on message that there is a doctor on call after hours.

## 2014-05-18 NOTE — Telephone Encounter (Signed)
New Message      Pt c/o BP issue: STAT if pt c/o blurred vision, one-sided weakness or slurred speech  1. What are your last 5 BP readings? 05/15/14- 145/63) p 62 05/16/14 178/79 P 55 05/17/14 160/85 P 84 05/18/14 192/82 P 95 2. Are you having any other symptoms (ex. Dizziness, headache, blurred vision, passed out)?no   3. What is your BP issue? BP keep going up.

## 2014-05-18 NOTE — Telephone Encounter (Signed)
Try to call patient back. No answer.

## 2014-05-23 NOTE — Telephone Encounter (Signed)
Left message to call back  

## 2014-05-25 ENCOUNTER — Other Ambulatory Visit (HOSPITAL_COMMUNITY): Payer: Self-pay | Admitting: Pulmonary Disease

## 2014-05-25 DIAGNOSIS — R911 Solitary pulmonary nodule: Secondary | ICD-10-CM

## 2014-05-29 ENCOUNTER — Ambulatory Visit: Payer: Medicare Other | Admitting: Cardiovascular Disease

## 2014-05-29 ENCOUNTER — Ambulatory Visit (HOSPITAL_COMMUNITY)
Admission: RE | Admit: 2014-05-29 | Discharge: 2014-05-29 | Disposition: A | Discharge status: Home / Self Care | Payer: Medicare Other | Source: Ambulatory Visit | Attending: Pulmonary Disease | Admitting: Pulmonary Disease

## 2014-05-29 DIAGNOSIS — C329 Malignant neoplasm of larynx, unspecified: Secondary | ICD-10-CM | POA: Insufficient documentation

## 2014-05-29 DIAGNOSIS — E119 Type 2 diabetes mellitus without complications: Secondary | ICD-10-CM | POA: Insufficient documentation

## 2014-05-29 DIAGNOSIS — R911 Solitary pulmonary nodule: Secondary | ICD-10-CM | POA: Insufficient documentation

## 2014-05-29 DIAGNOSIS — R918 Other nonspecific abnormal finding of lung field: Secondary | ICD-10-CM | POA: Diagnosis not present

## 2014-05-29 LAB — POCT I-STAT CREATININE: CREATININE: 0.9 mg/dL (ref 0.50–1.35)

## 2014-05-29 MED ORDER — IOHEXOL 300 MG/ML  SOLN
80.0000 mL | Freq: Once | INTRAMUSCULAR | Status: AC | PRN
  Administered 2014-05-29: 80 mL via INTRAVENOUS

## 2014-05-31 NOTE — Telephone Encounter (Signed)
Spoke with pt who thinks blood pressure issues were related to his machine. He has gotten a new machine and now readings are fine. He will continue to monitor and bring record of readings to next office visit

## 2014-06-06 DIAGNOSIS — K5732 Diverticulitis of large intestine without perforation or abscess without bleeding: Secondary | ICD-10-CM | POA: Diagnosis not present

## 2014-06-06 DIAGNOSIS — R1310 Dysphagia, unspecified: Secondary | ICD-10-CM | POA: Diagnosis not present

## 2014-06-19 DIAGNOSIS — E119 Type 2 diabetes mellitus without complications: Secondary | ICD-10-CM | POA: Diagnosis not present

## 2014-06-20 DIAGNOSIS — Z4651 Encounter for fitting and adjustment of gastric lap band: Secondary | ICD-10-CM | POA: Diagnosis not present

## 2014-06-20 DIAGNOSIS — K9509 Other complications of gastric band procedure: Secondary | ICD-10-CM | POA: Diagnosis not present

## 2014-07-03 DIAGNOSIS — Z79899 Other long term (current) drug therapy: Secondary | ICD-10-CM | POA: Diagnosis not present

## 2014-07-03 DIAGNOSIS — K449 Diaphragmatic hernia without obstruction or gangrene: Secondary | ICD-10-CM | POA: Diagnosis not present

## 2014-07-03 DIAGNOSIS — D124 Benign neoplasm of descending colon: Secondary | ICD-10-CM | POA: Diagnosis not present

## 2014-07-03 DIAGNOSIS — K259 Gastric ulcer, unspecified as acute or chronic, without hemorrhage or perforation: Secondary | ICD-10-CM | POA: Diagnosis not present

## 2014-07-03 DIAGNOSIS — K5730 Diverticulosis of large intestine without perforation or abscess without bleeding: Secondary | ICD-10-CM | POA: Diagnosis not present

## 2014-07-03 DIAGNOSIS — K2950 Unspecified chronic gastritis without bleeding: Secondary | ICD-10-CM | POA: Diagnosis not present

## 2014-07-03 DIAGNOSIS — Z1211 Encounter for screening for malignant neoplasm of colon: Secondary | ICD-10-CM | POA: Diagnosis not present

## 2014-07-03 DIAGNOSIS — K219 Gastro-esophageal reflux disease without esophagitis: Secondary | ICD-10-CM | POA: Diagnosis not present

## 2014-07-03 DIAGNOSIS — Z888 Allergy status to other drugs, medicaments and biological substances status: Secondary | ICD-10-CM | POA: Diagnosis not present

## 2014-07-03 DIAGNOSIS — I10 Essential (primary) hypertension: Secondary | ICD-10-CM | POA: Diagnosis not present

## 2014-07-03 DIAGNOSIS — R1310 Dysphagia, unspecified: Secondary | ICD-10-CM | POA: Diagnosis not present

## 2014-07-03 DIAGNOSIS — I2510 Atherosclerotic heart disease of native coronary artery without angina pectoris: Secondary | ICD-10-CM | POA: Diagnosis not present

## 2014-07-03 DIAGNOSIS — Z9861 Coronary angioplasty status: Secondary | ICD-10-CM | POA: Diagnosis not present

## 2014-07-03 DIAGNOSIS — E119 Type 2 diabetes mellitus without complications: Secondary | ICD-10-CM | POA: Diagnosis not present

## 2014-07-03 DIAGNOSIS — Z87891 Personal history of nicotine dependence: Secondary | ICD-10-CM | POA: Diagnosis not present

## 2014-07-03 DIAGNOSIS — D123 Benign neoplasm of transverse colon: Secondary | ICD-10-CM | POA: Diagnosis not present

## 2014-07-03 DIAGNOSIS — I252 Old myocardial infarction: Secondary | ICD-10-CM | POA: Diagnosis not present

## 2014-07-03 DIAGNOSIS — G4730 Sleep apnea, unspecified: Secondary | ICD-10-CM | POA: Diagnosis not present

## 2014-07-03 DIAGNOSIS — K648 Other hemorrhoids: Secondary | ICD-10-CM | POA: Diagnosis not present

## 2014-07-03 NOTE — Progress Notes (Signed)
History of Present Illness: 64 yo WM with history of CAD, PAD, HTN, OSA who is here today for cardiac follow up. He has been followed in the past by Dr. Lattie Haw but moved his care to the [facility] office November 2013. His coronary history includes MI in 1999 with stent placement ? Vessel following tPA and then balloon angioplasty of the PDA and second marginal in March of 2000. He did have a stress test in November 2011 which was normal. Admitted December 2012 to [facility] service with an ischemic right leg. He had undergone prior aortic Zenith aneurysm stent graft repair in 2007. Was seen urgently by Dr. Trula Slade in December 2012 and was found to have occluded right limb of stent graft as well as thrombosed popliteal aneurysm right leg. He underwent femoral-femoral bypass grafting and a femoral to below knee popliteal artery bypass graft with Gore-Tex. Readmitted March 2013 and underwent ligation of left popliteal aneurysm with left above-knee to below-knee popliteal bypass with reversed greater saphenous vein by Dr. Oneida Alar. I saw him 02/13/12 and he had c/o pinching type chest pains for a few months, occurring several times per week as well as worsening dyspnea. I arranged a Lexiscan stress myoview on 02/18/12 which showed a moderate sized scar of moderate severity involving the mid-inferior, basal inferior, mid-inferolateral and basal inferolateral segments. There was minimal reversibility. LVEF=38%. Cardiac cath 03/11/12 with moderate stenosis mid LAD and mid Circumflex with severe stenosis small OM branch (too small for PCI) and totally occluded mid RCA. We continued medical management. Carotid artery dopplers November 2013 with mild bilateral disease. He was seen in our office 04/07/14 for surgical clearance before gastric sleeve surgery. Stress myoview 04/13/14 was a high risk study with no clear ischemia, reduced LVEF. Echo 04/25/14 with LVEF=45-50%. He is intolerant to statins, beta blockers, Norvasc.    He is here today for follow up. He has no chest pain. Some SOB but unchanged. LE swelling is unchanged. Feeling great. Wishing to have gastric sleeve procedure.   Primary Care Physician:  Sinda Du   Last Lipid Profile: Followed in primary care   Past Medical History  Diagnosis Date  . Arteriosclerotic cardiovascular disease (ASCVD)     a. MI in 1999;  b.  PTCA of PDA and 2nd marginal in 3/00;  c. 02/2012 Cath: LM nl, LAD 39m, LCX 94m, OM small, 70, RCA 99p/144m-->Med Rx;  d. 03/2014 MV: EF 32%, inflat infarct w/ mild peri-infarct isch towards apex (similar to 2013 MV); d. 03/2014 Echo: EF 45-50%, Gr 1 DD, mildly dil LA.  Marland Kitchen AAA (abdominal aortic aneurysm) 2007    stent graft repair 12/07  . Laryngeal carcinoma 2010    resection and radiation therapy 2010-11  . Diverticulitis 2006  . Obesity     BMI 47; lap band surgery 2010  . Gastroesophageal reflux disease     Hiatal hernia; previously treated with PPI  . Tobacco abuse, in remission     40 pack years; discontinued in 2000  . Cerebrovascular disease   . Cholelithiasis 11/28/2010  . Elevated PSA 06/2011    a. s/p TURP 01/2014.  Marland Kitchen Sleep apnea     BiPAP mask utilized        2 yrs sleep study    dr Redmond Pulling  . Hypertension     Mild; controlled with a single agent    . Cough, persistent   . 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 Peripheral arterial stent graft  2007    for abdominal aortic aneurysm  . Larynx surgery  2010    Resection of carcinoma  . Open anterior shoulder reconstruction  2011    Left  . Laparoscopic gastric banding  2010  . Eye surgery    . Cataract extraction    . Colonoscopy  10/2007  . Femoral-femoral bypass graft  04/18/2011    Procedure: BYPASS GRAFT FEMORAL-FEMORAL ARTERY;  Surgeon: Theotis Burrow, MD;  Location: [facility] OR;  Service: Vascular;  Laterality: Bilateral;  Left to right femoral to femoral bypass   . Femoral-popliteal bypass  graft  04/18/2011    Procedure: BYPASS GRAFT FEMORAL-POPLITEAL ARTERY;  Surgeon: Theotis Burrow, MD;  Location: [facility] OR;  Service: Vascular;  Laterality: Right;  Right femoral popliteal bypass with propaten gortex graft  . Cardiac catheterization  2000    PCI-stent  . Pr vein bypass graft,aorto-fem-pop  07-23-11    Left AK to BK popliteal BPG using rev. saphenous vein  . Colonoscopy with esophagogastroduodenoscopy (egd)  2009    RMR: He had distal esophageal erosions, patulous GE junction, erosive reflux esophagitis, hiatal hernia, lipoma on the angularis not manipulated, left-sided diverticula.  . Left heart catheterization with coronary angiogram N/A 03/10/2012    Procedure: LEFT HEART CATHETERIZATION WITH CORONARY ANGIOGRAM;  Surgeon: Burnell Blanks, MD;  Location: [facility] CATH LAB;  Service: Cardiovascular;  Laterality: N/A;    Current Outpatient Prescriptions  Medication Sig Dispense Refill  . furosemide (LASIX) 40 MG tablet TAKE 1 TABLET (40 MG TOTAL) BY MOUTH DAILY. 30 tablet 1  . gabapentin (NEURONTIN) 400 MG capsule Take 400 mg by mouth 4 (four) times daily.    . hydrALAZINE (APRESOLINE) 25 MG tablet Take 1 tablet (25 mg total) by mouth 3 (three) times daily. 90 tablet 3  . ibuprofen (ADVIL,MOTRIN) 200 MG tablet Take 200 mg by mouth daily as needed for mild pain or moderate pain.     Marland Kitchen irbesartan (AVAPRO) 300 MG tablet Take 300 mg by mouth daily.    Marland Kitchen KLOR-CON M20 20 MEQ tablet TAKE 1 TABLET (20 MEQ TOTAL) BY MOUTH DAILY. 30 tablet 1  . metFORMIN (GLUCOPHAGE-XR) 500 MG 24 hr tablet Take 500 mg by mouth 2 (two) times daily.    . Tamsulosin HCl (FLOMAX) 0.4 MG CAPS Take 0.4 mg by mouth daily after supper.     . nitroGLYCERIN (NITROSTAT) 0.4 MG SL tablet Place 1 tablet (0.4 mg total) under the tongue every 5 (five) minutes as needed for chest pain. (Patient not taking: Reported on 07/04/2014) 25 tablet 11   No current facility-administered medications for this visit.    Allergies   Allergen Reactions  . Aspirin     Other reaction(s): GI Upset (intolerance) Cannot take daily, just once in a while Daily dose upsets stomach  . Other Rash    Beta-blockers  . Statins Hives and Rash    History   Social History  . Marital Status: Married    Spouse Name: N/A  . Number of Children: 3  . Years of Education: N/A   Occupational History  . Retired, part Doctor, general practice    Social History Main Topics  . Smoking status: Former Smoker -- 2.00 packs/day for 30 years    Types: Cigarettes    Quit date: 11/26/1997  . Smokeless tobacco: Never Used     Comment: 40 pack years; quit 2000  . Alcohol Use: No  . Drug  Use: No  . Sexual Activity: Yes   Other Topics Concern  . Not on file   Social History Narrative   Married, past employed, does not get regular exercise.     Family History  Problem Relation Age of Onset  . Heart attack Father     deceased  . Heart failure Father   . Hyperlipidemia Father   . Hypertension Father   . Heart disease Father   . Lung cancer Mother     deceased   . Hypertension Mother   . Hyperlipidemia Mother   . Heart failure Mother   . Diabetes Mother   . Cancer Sister     vaginal  . Diabetes Sister   . Colon cancer Neg Hx     Review of Systems:  As stated in the HPI and otherwise negative.   BP 141/93 mmHg  Pulse 84  Ht 5\' 11"  (1.803 m)  Wt 333 lb (151.048 kg)  BMI 46.46 kg/m2  Physical Examination: General: Well developed, well nourished, NAD HEENT: OP clear, mucus membranes moist SKIN: warm, dry. No rashes. Neuro: No focal deficits Musculoskeletal: Muscle strength 5/5 all ext Psychiatric: Mood and affect normal Neck: No JVD, no carotid bruits, no thyromegaly, no lymphadenopathy. Lungs:Clear bilaterally, no wheezes, rhonci, crackles Cardiovascular: Regular rate and rhythm. No murmurs, gallops or rubs. Abdomen:Soft. Bowel sounds present. Non-tender.  Extremities: No lower extremity edema. Pulses are 2 + in the bilateral  DP/PT.  Cardiac cath 03/11/12: Left main: No obstructive disease.  Left Anterior Descending Artery: Large caliber vessel that courses to the apex. The mid vessel has a 50% stenosis which was visualized in multiple views and does not appear to be flow limiting. The first diagonal branch arises from this area of stenosis. This small to moderate sized branch has mild plaque disease. The mid and distal LAD has mild plaque disease.  Circumflex Artery: Large caliber vessel with moderate sized bifurcating first obtuse marginal branch with mild plaque disease. 50% stenosis mid AV groove Circumflex, does not appear flow limiting. The most distal obtuse marginal is small in caliber and has a 70% stenosis. This vessel is too small for PCI.  Right Coronary Artery: This appears to be at least a moderate sized vessel. The proximal vessel has 99% sub-total occlusion followed by a 100% occlusion in the mid vessel.  Left Ventricular Angiogram: Deferred.  Impression:  1. Triple vessel CAD with moderate disease in the LAD and Circumflex, chronic total occlusion of the RCA  2. Inferior wall scar on myoview c/w the chronic total occlusion of the RCA  Stress myoview 04/13/14: Stress Procedure: The patient received IV Lexiscan 0.4 mg over 15-seconds. Technetium 70m Sestamibi injected at 30-seconds. The patient had baseline Chest Tightness 1/10, but no change with Lexiscan.Quantitative spect images were obtained after a 45 minute delay. Stress ECG: No significant ST segment change suggestive of ischemia. QPS Raw Data Images: Acquisition technically good; LVE. Stress Images: There is decreased uptake in the inferior lateral wall and apex. Rest Images: There is decreased uptake in the inferior lateral wall. Subtraction (SDS): These findings are consistent with prior infarct and mild peri-infarct ischemia. Transient Ischemic Dilatation (Normal <1.22): 1.08 Lung/Heart Ratio (Normal <0.45): 0.42 Quantitative Gated  Spect Images QGS EDV: 230 ml QGS ESV: 156 ml Impression Exercise Capacity: Lexiscan with no exercise. BP Response: Normal blood pressure response. Clinical Symptoms: No chest pain or dyspnea. ECG Impression: No significant ST segment change suggestive of ischemia. Comparison with Prior Nuclear Study: Compared to  02/26/12, no significant change. Overall Impression: High risk stress nuclear study with a large, severe, partially reversible inferior lateral defect consistent with prior infarct and mild peri-infarct ischemia towards the apex. Study high risk due to reduced LV function. LV Ejection Fraction: 32%. LV Wall Motion: Global hypokinesis with akinesis of the inferior lateral wall.  Echo 04/25/14: Left ventricle: The cavity size was mildly dilated. Wall thickness was normal. Systolic function was mildly reduced. The estimated ejection fraction was in the range of 45% to 50%. Doppler parameters are consistent with abnormal left ventricular relaxation (grade 1 diastolic dysfunction). - Left atrium: The atrium was mildly dilated.  Assessment and Plan:   1. CAD: Stable. He is known to have moderate mid LAD and moderate mid Circumflex disease with chronic occlusion of mid RCA by cath November 2013. Stress myoview December 2015 with no ischemia. Echo December 2015 with mild LV systolic dysfunction.  He is intolerant to ASA and statins.   2. Carotid artery disease: Mild bilateral disease by carotid dopplers November 2013.    3. PAD: Followed in [facility]  4. Ischemic Cardiomyopathy/Chronic diastolic/systolic CHF: He is on an ARB. He does not tolerate beta blockers. Will continue Lasix 40 mg po Qdaily. Volume status is ok.   5. Hyperlipidemia: He is intolerant to statins. Can consider referral to lipid clinic.   6. HTN: BP has been elevated at home and is elevated today. He is now on ARB and hydralazine. He is intolerant of Norvasc, beta blockers. Will increase hydralazine to 50  mg po TID.   7. Pre-operative cardiovascular risk assessment: Stress test does not show ischemia. Echo with slight LV dysfunction. Can proceed with planned surgical procedure. Will call back today with name of surgeon.

## 2014-07-04 ENCOUNTER — Telehealth: Payer: Self-pay | Admitting: Cardiovascular Disease

## 2014-07-04 ENCOUNTER — Ambulatory Visit (INDEPENDENT_AMBULATORY_CARE_PROVIDER_SITE_OTHER): Payer: Medicare Other | Admitting: Cardiovascular Disease

## 2014-07-04 ENCOUNTER — Encounter: Payer: Self-pay | Admitting: Cardiovascular Disease

## 2014-07-04 VITALS — BP 141/93 | HR 84 | Ht 71.0 in | Wt 333.0 lb

## 2014-07-04 DIAGNOSIS — I2510 Atherosclerotic heart disease of native coronary artery without angina pectoris: Secondary | ICD-10-CM

## 2014-07-04 DIAGNOSIS — I5042 Chronic combined systolic (congestive) and diastolic (congestive) heart failure: Secondary | ICD-10-CM

## 2014-07-04 DIAGNOSIS — I739 Peripheral vascular disease, unspecified: Secondary | ICD-10-CM | POA: Diagnosis not present

## 2014-07-04 DIAGNOSIS — I10 Essential (primary) hypertension: Secondary | ICD-10-CM

## 2014-07-04 DIAGNOSIS — E785 Hyperlipidemia, unspecified: Secondary | ICD-10-CM | POA: Diagnosis not present

## 2014-07-04 MED ORDER — HYDRALAZINE HCL 50 MG PO TABS: 50.0000 mg | ORAL_TABLET | Freq: Three times a day (TID) | ORAL | Status: DC

## 2014-07-04 NOTE — Telephone Encounter (Signed)
PT SEEN THIS AM AND WANTS TO TALK TO THE NURSE RE FAX THE RESULTS OF HIS STRESS TEST TO DR [facility] AT [phone] ,PLS CALL IF ANY PROBLEMS [phone]

## 2014-07-04 NOTE — Telephone Encounter (Signed)
Stress test and office note not faxed.  Dr. Angelena Form will write letter to Dr. Volanda Napoleon. Will fax letter to number below.

## 2014-07-04 NOTE — Telephone Encounter (Signed)
I called Dr. Loistine Chance office and confirmed fax number below is correct.  Will fax stress test results and office note from today indicating pt is cleared for surgery.

## 2014-07-04 NOTE — Patient Instructions (Signed)
Your physician wants you to follow-up in: 6 months.    You will receive a reminder letter in the mail two months in advance. If you don't receive a letter, please call our office to schedule the follow-up appointment.  Your physician has recommended you make the following change in your medication:  Increase hydralazine to 50 mg by mouth three times daily.   Call our office with the name of your surgeon.

## 2014-07-05 ENCOUNTER — Encounter: Payer: Self-pay | Admitting: Vascular Surgery

## 2014-07-06 ENCOUNTER — Ambulatory Visit: Payer: Medicare Other | Admitting: Vascular Surgery

## 2014-07-06 ENCOUNTER — Other Ambulatory Visit: Payer: Self-pay | Admitting: Vascular Surgery

## 2014-07-06 ENCOUNTER — Ambulatory Visit (INDEPENDENT_AMBULATORY_CARE_PROVIDER_SITE_OTHER): Payer: Medicare Other | Admitting: Vascular Surgery

## 2014-07-06 ENCOUNTER — Ambulatory Visit (INDEPENDENT_AMBULATORY_CARE_PROVIDER_SITE_OTHER)
Admission: RE | Admit: 2014-07-06 | Discharge: 2014-07-06 | Disposition: A | Discharge status: Home / Self Care | Payer: Medicare Other | Source: Ambulatory Visit | Attending: Vascular Surgery | Admitting: Vascular Surgery

## 2014-07-06 ENCOUNTER — Ambulatory Visit (HOSPITAL_COMMUNITY)
Admission: RE | Admit: 2014-07-06 | Discharge: 2014-07-06 | Disposition: A | Discharge status: Home / Self Care | Payer: Medicare Other | Source: Ambulatory Visit | Attending: Vascular Surgery | Admitting: Vascular Surgery

## 2014-07-06 ENCOUNTER — Encounter: Payer: Self-pay | Admitting: Vascular Surgery

## 2014-07-06 VITALS — BP 148/77 | HR 79 | Ht 71.0 in | Wt 335.2 lb

## 2014-07-06 DIAGNOSIS — I739 Peripheral vascular disease, unspecified: Secondary | ICD-10-CM

## 2014-07-06 DIAGNOSIS — I714 Abdominal aortic aneurysm, without rupture, unspecified: Secondary | ICD-10-CM

## 2014-07-06 DIAGNOSIS — Z48812 Encounter for surgical aftercare following surgery on the circulatory system: Secondary | ICD-10-CM

## 2014-07-06 NOTE — Progress Notes (Signed)
History of Present Illness:  Patient is a 64 y.o. year old male who presents for follow-up evaluation of AAA. He underwent Zenith aneurysm stent graft repair in 2007. The patient denies new abdominal or back pain. He has also undergone a left-to-right femoral-femoral bypass for limb occlusion of a Zenith graft. At the same time he had a right femoral to below-knee popliteal bypass with propaten. This was in December of 2012. He subsequently underwent a left above-knee to below-knee popliteal bypass with vein in March of 2013. He reports no claudication symptoms. He does have some intermittent swelling of his lower extremities. The patient's atherosclerotic risk factors remain obesity, hypertension.  These are all currently stable and followed by his primary care physician.   He had several episodes of prostate and urinary tract issues several months ago requiring hospitalization but has recovered from this.  He currently is undergoing evaluation for a gastric sleeve for morbid obesity. He apparently has failed lap banding.   Past Medical History  Diagnosis Date  . Arteriosclerotic cardiovascular disease (ASCVD)     a. MI in 1999;  b.  PTCA of PDA and 2nd marginal in 3/00;  c. 02/2012 Cath: LM nl, LAD 40m, LCX 11m, OM small, 70, RCA 99p/163m-->Med Rx;  d. 03/2014 MV: EF 32%, inflat infarct w/ mild peri-infarct isch towards apex (similar to 2013 MV); d. 03/2014 Echo: EF 45-50%, Gr 1 DD, mildly dil LA.  Marland Kitchen AAA (abdominal aortic aneurysm) 2007    stent graft repair 12/07  . Laryngeal carcinoma 2010    resection and radiation therapy 2010-11  . Diverticulitis 2006  . Obesity     BMI 47; lap band surgery 2010  . Gastroesophageal reflux disease     Hiatal hernia; previously treated with PPI  . Tobacco abuse, in remission     40 pack years; discontinued in 2000  . Cerebrovascular disease   . Cholelithiasis 11/28/2010  . Elevated PSA 06/2011    a. s/p TURP 01/2014.  Marland Kitchen Sleep apnea     BiPAP mask utilized         2 yrs sleep study    dr Redmond Pulling  . Hypertension     Mild; controlled with a single agent    . Cough, persistent   . Popliteal aneurysm     popliteal bypass-2013   Past Surgical History  Procedure Laterality Date  . Peripheral arterial stent graft  2007    for abdominal aortic aneurysm  . Larynx surgery  2010    Resection of carcinoma  . Open anterior shoulder reconstruction  2011    Left  . Laparoscopic gastric banding  2010  . Eye surgery    . Cataract extraction    . Colonoscopy  10/2007  . Femoral-femoral bypass graft  04/18/2011    Procedure: BYPASS GRAFT FEMORAL-FEMORAL ARTERY;  Surgeon: Theotis Burrow, MD;  Location: [facility] OR;  Service: Vascular;  Laterality: Bilateral;  Left to right femoral to femoral bypass   . Femoral-popliteal bypass graft  04/18/2011    Procedure: BYPASS GRAFT FEMORAL-POPLITEAL ARTERY;  Surgeon: Theotis Burrow, MD;  Location: [facility] OR;  Service: Vascular;  Laterality: Right;  Right femoral popliteal bypass with propaten gortex graft  . Cardiac catheterization  2000    PCI-stent  . Pr vein bypass graft,aorto-fem-pop  07-23-11    Left AK to BK popliteal BPG using rev. saphenous vein  . Colonoscopy with esophagogastroduodenoscopy (egd)  2009    RMR: He had distal esophageal erosions, patulous GE  junction, erosive reflux esophagitis, hiatal hernia, lipoma on the angularis not manipulated, left-sided diverticula.  . Left heart catheterization with coronary angiogram N/A 03/10/2012    Procedure: LEFT HEART CATHETERIZATION WITH CORONARY ANGIOGRAM;  Surgeon: Burnell Blanks, MD;  Location: [facility] CATH LAB;  Service: Cardiovascular;  Laterality: N/A;       Review of Systems:  Neurologic: denies symptoms of TIA, amaurosis, or stroke Cardiac:denies shortness of breath or chest pain Pulmonary: denies cough or wheeze Abdomen: denies abdominal pain nausea or vomiting    History      Social History   .  Marital Status:  Married       Spouse Name:  N/A        Number of Children:  N/A   .  Years of Education:  N/A      Occupational History   .  Not on file.      Social History Main Topics   .  Smoking status:  Former Smoker       Quit date:  11/26/1997   .  Smokeless tobacco:  Never Used     Comment: 40 pack years; quit 2000   .  Alcohol Use:  No   .  Drug Use:  No   .  Sexually Active:  Yes      Other Topics  Concern   .  Not on file      Social History Narrative     Married, past employed, does not get regular exercise.        Allergies   Allergen  Reactions   .  Aspirin         Daily dose upsets stomach   .  Statins  Hives and Rash       Current Outpatient Prescriptions on File Prior to Visit  Medication Sig Dispense Refill  . furosemide (LASIX) 40 MG tablet TAKE 1 TABLET (40 MG TOTAL) BY MOUTH DAILY. 30 tablet 1  . gabapentin (NEURONTIN) 400 MG capsule Take 400 mg by mouth 4 (four) times daily.    . hydrALAZINE (APRESOLINE) 50 MG tablet Take 1 tablet (50 mg total) by mouth 3 (three) times daily. 90 tablet 11  . irbesartan (AVAPRO) 300 MG tablet Take 300 mg by mouth daily.    Marland Kitchen KLOR-CON M20 20 MEQ tablet TAKE 1 TABLET (20 MEQ TOTAL) BY MOUTH DAILY. 30 tablet 1  . metFORMIN (GLUCOPHAGE-XR) 500 MG 24 hr tablet Take 500 mg by mouth 2 (two) times daily.    . nitroGLYCERIN (NITROSTAT) 0.4 MG SL tablet Place 1 tablet (0.4 mg total) under the tongue every 5 (five) minutes as needed for chest pain. 25 tablet 11  . Tamsulosin HCl (FLOMAX) 0.4 MG CAPS Take 0.4 mg by mouth daily after supper.     Marland Kitchen ibuprofen (ADVIL,MOTRIN) 200 MG tablet Take 200 mg by mouth daily as needed for mild pain or moderate pain.      No current facility-administered medications on file prior to visit.       Physical Examination     Filed Vitals:   07/06/14 1135  BP: 148/77  Pulse: 79  Height: 5\' 11"  (1.803 m)  Weight: 335 lb 3.2 oz (152.046 kg)  SpO2: 98%    General:  Alert and oriented, no acute distress HEENT: Normal Neck: No bruit or  JVD Pulmonary: Clear to auscultation bilaterally Cardiac: Regular Rate and Rhythm without murmur Abdomen: Soft, non-tender, non-distended, normal bowel sounds, no pulsatile mass, obese Extremities: 2+ femoral pulses,  vaguely palpable dorsalis pedis pulse right side absent pedal pulses left. Trace edema. Skin: Feet pink warm and well-perfused   DATA:   Patient had an aortic ultrasound today which was difficult due to overlying bowel gas and the patient's obesity. The aneurysm diameter of 5.2 cm was obtained. He also had a graft duplex scan which showed a patent left-to-right femoral-femoral bypass. ABIs today were 0.6 on the left, 1.12 on the right, right femoral to below-knee popliteal bypass is patent with no increased velocities. A portion of the above-knee to below-knee popliteal bypass was visualized with some flow within it however the superficial femoral artery proximal to this has occluded.  ASSESSMENT:   Doing well status post Zenith stent graft repair aneurysm as well as bilateral popliteal aneurysm repairs. Chronic occlusion of left leg bypass graft. He does have some mild claudication symptoms on further questioning. However this is not significantly bothersome to him. His aneurysm diameter today by ultrasound was 5.2 cm which is significantly increased from his last ultrasound at 3.7 cm 2 years ago. I also reviewed a PET scan that was performed recently and his aneurysm diameter 3.3 cm on that scan. Most likely this is a discrepancy from ultrasound rather than actual aortic diameter increased. However, we will obtain a CT scan to make sure that this is just a discrepancy in ultrasound.   PLAN: I will follow-up on the patient's CT scan within the next few weeks. If this is still in the 3 cm range then the patient will return in one year to review his stent graft with an ultrasound in the office, as well as repeat his duplex scans of both lower extremities with ABIs.  Ruta Hinds,  MD [facility] Office: [phone] Pager: [phone]

## 2014-07-07 NOTE — Addendum Note (Signed)
Addended by: Dorthula Rue L on: 07/07/2014 09:56 AM   Modules accepted: Orders

## 2014-07-10 DIAGNOSIS — G4733 Obstructive sleep apnea (adult) (pediatric): Secondary | ICD-10-CM | POA: Diagnosis not present

## 2014-07-12 ENCOUNTER — Ambulatory Visit: Payer: Medicare Other | Admitting: Vascular Surgery

## 2014-07-14 ENCOUNTER — Other Ambulatory Visit: Payer: Self-pay | Admitting: *Deleted

## 2014-07-14 DIAGNOSIS — Z01812 Encounter for preprocedural laboratory examination: Secondary | ICD-10-CM

## 2014-07-15 LAB — BUN: BUN: 7 mg/dL (ref 6–23)

## 2014-07-15 LAB — CREATININE, SERUM: CREATININE: 0.86 mg/dL (ref 0.50–1.35)

## 2014-07-17 ENCOUNTER — Ambulatory Visit
Admission: RE | Admit: 2014-07-17 | Discharge: 2014-07-17 | Disposition: A | Discharge status: Home / Self Care | Payer: Medicare Other | Source: Ambulatory Visit | Attending: Vascular Surgery | Admitting: Vascular Surgery

## 2014-07-17 DIAGNOSIS — I714 Abdominal aortic aneurysm, without rupture, unspecified: Secondary | ICD-10-CM

## 2014-07-17 DIAGNOSIS — I743 Embolism and thrombosis of arteries of the lower extremities: Secondary | ICD-10-CM | POA: Diagnosis not present

## 2014-07-17 MED ORDER — IOPAMIDOL (ISOVUE-370) INJECTION 76%
125.0000 mL | Freq: Once | INTRAVENOUS | Status: AC | PRN
  Administered 2014-07-17: 125 mL via INTRAVENOUS

## 2014-07-18 DIAGNOSIS — Z9884 Bariatric surgery status: Secondary | ICD-10-CM | POA: Diagnosis not present

## 2014-07-18 DIAGNOSIS — E119 Type 2 diabetes mellitus without complications: Secondary | ICD-10-CM | POA: Diagnosis not present

## 2014-07-18 DIAGNOSIS — I2510 Atherosclerotic heart disease of native coronary artery without angina pectoris: Secondary | ICD-10-CM | POA: Diagnosis not present

## 2014-07-24 ENCOUNTER — Telehealth: Payer: Self-pay | Admitting: *Deleted

## 2014-07-24 NOTE — Telephone Encounter (Signed)
-----   Message from Elam Dutch, MD sent at 07/24/2014  1:20 PM EDT ----- Let him know CT looks ok.  Keep follow up as per my note  Juanda Crumble ----- Message -----    From: Rad Results In Interface    Sent: 07/17/2014  12:58 PM      To: Elam Dutch, MD

## 2014-07-24 NOTE — Telephone Encounter (Signed)
Per Dr. Oneida Alar staff message, I called patient and discussed his CTA with him. Endovascular fem-fem graft is stable, occlusions are stable. Patient will come back in 1 year as per Dr. Oneida Alar office note on 07-06-14.  Mathew Padilla verbalized understanding and agreement with this plan. He will call us if he has any issues or worsening of leg pain.

## 2014-07-25 ENCOUNTER — Other Ambulatory Visit: Payer: Self-pay | Admitting: Cardiovascular Disease

## 2014-07-25 NOTE — Telephone Encounter (Signed)
Mathew Blanks, MD at 07/03/2014 8:56 PM  furosemide (LASIX) 40 MG tabletTAKE 1 TABLET (40 MG TOTAL) BY MOUTH DAILY  Patient Instructions     Your physician wants you to follow-up in: 6 months. You will receive a reminder letter in the mail two months in advance. If you don't receive a letter, please call our office to schedule the follow-up appointment.

## 2014-08-17 ENCOUNTER — Ambulatory Visit: Payer: Medicare Other | Admitting: Vascular Surgery

## 2014-09-04 DIAGNOSIS — Z6841 Body Mass Index (BMI) 40.0 and over, adult: Secondary | ICD-10-CM | POA: Diagnosis not present

## 2014-09-04 DIAGNOSIS — E119 Type 2 diabetes mellitus without complications: Secondary | ICD-10-CM | POA: Diagnosis not present

## 2014-09-19 DIAGNOSIS — E11329 Type 2 diabetes mellitus with mild nonproliferative diabetic retinopathy without macular edema: Secondary | ICD-10-CM | POA: Diagnosis not present

## 2014-09-19 DIAGNOSIS — E11319 Type 2 diabetes mellitus with unspecified diabetic retinopathy without macular edema: Secondary | ICD-10-CM | POA: Diagnosis not present

## 2014-09-19 DIAGNOSIS — E1142 Type 2 diabetes mellitus with diabetic polyneuropathy: Secondary | ICD-10-CM | POA: Diagnosis not present

## 2014-09-19 DIAGNOSIS — I10 Essential (primary) hypertension: Secondary | ICD-10-CM | POA: Diagnosis not present

## 2014-09-19 DIAGNOSIS — I2510 Atherosclerotic heart disease of native coronary artery without angina pectoris: Secondary | ICD-10-CM | POA: Diagnosis not present

## 2014-09-26 DIAGNOSIS — E119 Type 2 diabetes mellitus without complications: Secondary | ICD-10-CM | POA: Diagnosis not present

## 2014-11-30 DIAGNOSIS — E119 Type 2 diabetes mellitus without complications: Secondary | ICD-10-CM | POA: Diagnosis not present

## 2015-01-09 ENCOUNTER — Telehealth: Payer: Self-pay

## 2015-01-09 DIAGNOSIS — Z95828 Presence of other vascular implants and grafts: Secondary | ICD-10-CM

## 2015-01-09 DIAGNOSIS — R231 Pallor: Secondary | ICD-10-CM

## 2015-01-09 DIAGNOSIS — I739 Peripheral vascular disease, unspecified: Secondary | ICD-10-CM

## 2015-01-09 DIAGNOSIS — M79605 Pain in left leg: Secondary | ICD-10-CM

## 2015-01-09 NOTE — Telephone Encounter (Addendum)
Phone call from pt.  Reported increased pain in his feet, and onset of (L) leg cramps over past month.  Stated he has been treated for Neuropathy with Lyrica, and Gabapentin, but feels like he should have his PAD evaluated, sooner than next March.  Stated the cramps in the left leg occur at rest, and wake him up at night.  Denied any open sores of feet or legs.  Denied any LE pain with walking, but stated his feet hurt so bad that he has difficulty walking very far.  Also, stated his feet / legs are cooler, with left greater than right.  Denied any discoloration of bilat. feet/ legs.  Reported he has had swelling in his feet and lower legs, that has worsened over the past month, also.  Stated he is on fluid pills, but they aren't helping.  Questioned about SOB; reported "I've always been short of breath."  Pt. also c/o tingling of his hands and fingers over past month, that he can't get any relief from.  Advised will schedule appt. for eval. of PAD with Dr. Oneida Alar, per pt. request, but instructed pt. that he should contact his PCP re: increased swelling of feet and legs, and SOB over past month.  Verb. Understanding.  Agreed.

## 2015-01-09 NOTE — Telephone Encounter (Signed)
Spoke with pt to schedule, dpm °

## 2015-01-16 ENCOUNTER — Ambulatory Visit (HOSPITAL_COMMUNITY)
Admission: RE | Admit: 2015-01-16 | Discharge: 2015-01-16 | Disposition: A | Discharge status: Home / Self Care | Payer: Medicare Other | Source: Ambulatory Visit | Attending: Family | Admitting: Family

## 2015-01-16 ENCOUNTER — Encounter: Payer: Self-pay | Admitting: Vascular Surgery

## 2015-01-16 DIAGNOSIS — I739 Peripheral vascular disease, unspecified: Secondary | ICD-10-CM

## 2015-01-16 DIAGNOSIS — E119 Type 2 diabetes mellitus without complications: Secondary | ICD-10-CM | POA: Diagnosis not present

## 2015-01-16 DIAGNOSIS — R231 Pallor: Secondary | ICD-10-CM

## 2015-01-16 DIAGNOSIS — N401 Enlarged prostate with lower urinary tract symptoms: Secondary | ICD-10-CM | POA: Diagnosis not present

## 2015-01-16 DIAGNOSIS — I10 Essential (primary) hypertension: Secondary | ICD-10-CM | POA: Diagnosis not present

## 2015-01-16 DIAGNOSIS — M79605 Pain in left leg: Secondary | ICD-10-CM

## 2015-01-16 DIAGNOSIS — Z95828 Presence of other vascular implants and grafts: Secondary | ICD-10-CM

## 2015-01-16 DIAGNOSIS — Z9889 Other specified postprocedural states: Secondary | ICD-10-CM | POA: Diagnosis not present

## 2015-01-16 DIAGNOSIS — E1140 Type 2 diabetes mellitus with diabetic neuropathy, unspecified: Secondary | ICD-10-CM | POA: Diagnosis not present

## 2015-01-16 DIAGNOSIS — I2510 Atherosclerotic heart disease of native coronary artery without angina pectoris: Secondary | ICD-10-CM | POA: Diagnosis not present

## 2015-01-18 ENCOUNTER — Encounter: Payer: Self-pay | Admitting: Cardiovascular Disease

## 2015-01-18 ENCOUNTER — Ambulatory Visit (INDEPENDENT_AMBULATORY_CARE_PROVIDER_SITE_OTHER): Payer: Medicare Other | Admitting: Cardiovascular Disease

## 2015-01-18 ENCOUNTER — Ambulatory Visit (INDEPENDENT_AMBULATORY_CARE_PROVIDER_SITE_OTHER): Payer: Medicare Other | Admitting: Family

## 2015-01-18 ENCOUNTER — Encounter: Payer: Self-pay | Admitting: Family

## 2015-01-18 VITALS — BP 122/78 | HR 80 | Temp 98.3°F | Ht 71.0 in | Wt 327.0 lb

## 2015-01-18 VITALS — BP 150/74 | HR 88 | Ht 71.0 in | Wt 328.4 lb

## 2015-01-18 DIAGNOSIS — Z9889 Other specified postprocedural states: Secondary | ICD-10-CM

## 2015-01-18 DIAGNOSIS — Z48812 Encounter for surgical aftercare following surgery on the circulatory system: Secondary | ICD-10-CM

## 2015-01-18 DIAGNOSIS — E785 Hyperlipidemia, unspecified: Secondary | ICD-10-CM

## 2015-01-18 DIAGNOSIS — Z87891 Personal history of nicotine dependence: Secondary | ICD-10-CM

## 2015-01-18 DIAGNOSIS — I739 Peripheral vascular disease, unspecified: Secondary | ICD-10-CM

## 2015-01-18 DIAGNOSIS — I255 Ischemic cardiomyopathy: Secondary | ICD-10-CM

## 2015-01-18 DIAGNOSIS — Z95828 Presence of other vascular implants and grafts: Secondary | ICD-10-CM | POA: Diagnosis not present

## 2015-01-18 DIAGNOSIS — I2510 Atherosclerotic heart disease of native coronary artery without angina pectoris: Secondary | ICD-10-CM

## 2015-01-18 DIAGNOSIS — I10 Essential (primary) hypertension: Secondary | ICD-10-CM

## 2015-01-18 NOTE — Patient Instructions (Signed)

## 2015-01-18 NOTE — Progress Notes (Signed)
Chief Complaint  Patient presents with  . Shortness of Breath     History of Present Illness: 64 yo WM with history of CAD, PAD, HTN, OSA who is here today for cardiac follow up. He has been followed in the past by Dr. Lattie Haw but moved his care to the [facility] office November 2013. His coronary history includes MI in 1999 with stent placement ? Vessel following tPA and then balloon angioplasty of the PDA and second marginal in March of 2000. He did have a stress test in November 2011 which was normal. Admitted December 2012 to [facility] service with an ischemic right leg. He had undergone prior aortic Zenith aneurysm stent graft repair in 2007. Was seen urgently by Dr. Trula Slade in December 2012 and was found to have occluded right limb of stent graft as well as thrombosed popliteal aneurysm right leg. He underwent femoral-femoral bypass grafting and a femoral to below knee popliteal artery bypass graft with Gore-Tex. Readmitted March 2013 and underwent ligation of left popliteal aneurysm with left above-knee to below-knee popliteal bypass with reversed greater saphenous vein by Dr. Oneida Alar. I saw him 02/13/12 and he had c/o pinching type chest pains for a few months, occurring several times per week as well as worsening dyspnea. I arranged a Lexiscan stress myoview on 02/18/12 which showed a moderate sized scar of moderate severity involving the mid-inferior, basal inferior, mid-inferolateral and basal inferolateral segments. There was minimal reversibility. LVEF=38%. Cardiac cath 03/11/12 with moderate stenosis mid LAD and mid Circumflex with severe stenosis small OM branch (too small for PCI) and totally occluded mid RCA. We continued medical management. Carotid artery dopplers November 2013 with mild bilateral disease. He was seen in our office 04/07/14 for surgical clearance before gastric sleeve surgery. Stress myoview 04/13/14 was a high risk study with no clear ischemia, reduced LVEF. Echo 04/25/14 with  LVEF=45-50%. He is intolerant to statins, beta blockers, Norvasc.   He is here today for follow up. He has no chest pain. Some SOB but unchanged. LE swelling is unchanged. Feeling great. Wishing to have gastric sleeve procedure.   Primary Care Physician:  Sinda Du   Last Lipid Profile: Followed in primary care   Past Medical History  Diagnosis Date  . Arteriosclerotic cardiovascular disease (ASCVD)     a. MI in 1999;  b.  PTCA of PDA and 2nd marginal in 3/00;  c. 02/2012 Cath: LM nl, LAD 38m, LCX 55m, OM small, 70, RCA 99p/159m-->Med Rx;  d. 03/2014 MV: EF 32%, inflat infarct w/ mild peri-infarct isch towards apex (similar to 2013 MV); d. 03/2014 Echo: EF 45-50%, Gr 1 DD, mildly dil LA.  Marland Kitchen AAA (abdominal aortic aneurysm) 2007    stent graft repair 12/07  . Laryngeal carcinoma 2010    resection and radiation therapy 2010-11  . Diverticulitis 2006  . Obesity     BMI 47; lap band surgery 2010  . Gastroesophageal reflux disease     Hiatal hernia; previously treated with PPI  . Tobacco abuse, in remission     40 pack years; discontinued in 2000  . Cerebrovascular disease   . Cholelithiasis 11/28/2010  . Elevated PSA 06/2011    a. s/p TURP 01/2014.  Marland Kitchen Sleep apnea     BiPAP mask utilized        2 yrs sleep study    dr Redmond Pulling  . Hypertension     Mild; controlled with a single agent    . Cough, persistent   . Popliteal  aneurysm     popliteal bypass-2013  . CHF (congestive heart failure)   . CAD (coronary artery disease)   . Diabetes mellitus without complication   . DVT (deep venous thrombosis)   . Myocardial infarction     Past Surgical History  Procedure Laterality Date  . Peripheral arterial stent graft  2007    for abdominal aortic aneurysm  . Larynx surgery  2010    Resection of carcinoma  . Open anterior shoulder reconstruction  2011    Left  . Laparoscopic gastric banding  2010  . Eye surgery    . Cataract extraction    . Colonoscopy  10/2007  . Femoral-femoral  bypass graft  04/18/2011    Procedure: BYPASS GRAFT FEMORAL-FEMORAL ARTERY;  Surgeon: Theotis Burrow, MD;  Location: [facility] OR;  Service: Vascular;  Laterality: Bilateral;  Left to right femoral to femoral bypass   . Femoral-popliteal bypass graft  04/18/2011    Procedure: BYPASS GRAFT FEMORAL-POPLITEAL ARTERY;  Surgeon: Theotis Burrow, MD;  Location: [facility] OR;  Service: Vascular;  Laterality: Right;  Right femoral popliteal bypass with propaten gortex graft  . Cardiac catheterization  2000    PCI-stent  . Pr vein bypass graft,aorto-fem-pop  07-23-11    Left AK to BK popliteal BPG using rev. saphenous vein  . Colonoscopy with esophagogastroduodenoscopy (egd)  2009    RMR: He had distal esophageal erosions, patulous GE junction, erosive reflux esophagitis, hiatal hernia, lipoma on the angularis not manipulated, left-sided diverticula.  . Left heart catheterization with coronary angiogram N/A 03/10/2012    Procedure: LEFT HEART CATHETERIZATION WITH CORONARY ANGIOGRAM;  Surgeon: Burnell Blanks, MD;  Location: [facility] CATH LAB;  Service: Cardiovascular;  Laterality: N/A;    Current Outpatient Prescriptions  Medication Sig Dispense Refill  . aspirin 325 MG tablet Take 325 mg by mouth daily.    . carbamazepine (TEGRETOL XR) 200 MG 12 hr tablet Take 200 mg by mouth at bedtime.    . Ergocalciferol (VITAMIN D2 PO) Take 50,000 Units by mouth once a week.    . hydrALAZINE (APRESOLINE) 50 MG tablet Take 1 tablet (50 mg total) by mouth 3 (three) times daily. 90 tablet 11  . ibuprofen (ADVIL,MOTRIN) 200 MG tablet Take 200 mg by mouth daily as needed for mild pain or moderate pain.     Marland Kitchen irbesartan (AVAPRO) 300 MG tablet Take 300 mg by mouth daily.    Marland Kitchen KLOR-CON M20 20 MEQ tablet TAKE 1 TABLET (20 MEQ TOTAL) BY MOUTH DAILY. 30 tablet 1  . metFORMIN (GLUCOPHAGE-XR) 500 MG 24 hr tablet Take 500 mg by mouth 2 (two) times daily.    . nitroGLYCERIN (NITROSTAT) 0.4 MG SL tablet Place 1 tablet (0.4 mg total) under  the tongue every 5 (five) minutes as needed for chest pain. 25 tablet 11  . pregabalin (LYRICA) 200 MG capsule Take 200 mg by mouth. FOUR TIMES DAILY    . Tamsulosin HCl (FLOMAX) 0.4 MG CAPS Take 0.4 mg by mouth daily after supper.     . torsemide (DEMADEX) 20 MG tablet Take 20 mg by mouth daily.     No current facility-administered medications for this visit.    Allergies  Allergen Reactions  . Aspirin     Other reaction(s): GI Upset (intolerance) Cannot take daily, just once in a while Daily dose upsets stomach  . Other Rash    Beta-blockers  . Statins Hives and Rash    Social History   Social History  .  Marital Status: Married    Spouse Name: N/A  . Number of Children: 3  . Years of Education: N/A   Occupational History  . Retired, part Doctor, general practice    Social History Main Topics  . Smoking status: Former Smoker -- 2.00 packs/day for 30 years    Types: Cigarettes    Quit date: 11/26/1997  . Smokeless tobacco: Never Used     Comment: 40 pack years; quit 2000  . Alcohol Use: No  . Drug Use: No  . Sexual Activity: Yes   Other Topics Concern  . Not on file   Social History Narrative   Married, past employed, does not get regular exercise.     Family History  Problem Relation Age of Onset  . Heart attack Father     deceased  . Heart failure Father   . Hyperlipidemia Father   . Hypertension Father   . Heart disease Father   . Lung cancer Mother     deceased   . Hypertension Mother   . Hyperlipidemia Mother   . Heart failure Mother   . Diabetes Mother   . Cancer Sister     vaginal  . Diabetes Sister   . Colon cancer Neg Hx     Review of Systems:  As stated in the HPI and otherwise negative.   BP 150/74 mmHg  Pulse 88  Ht 5\' 11"  (1.803 m)  Wt 328 lb 6.4 oz (148.961 kg)  BMI 45.82 kg/m2  Physical Examination: General: Well developed, well nourished, NAD HEENT: OP clear, mucus membranes moist SKIN: warm, dry. No rashes. Neuro: No focal  deficits Musculoskeletal: Muscle strength 5/5 all ext Psychiatric: Mood and affect normal Neck: No JVD, no carotid bruits, no thyromegaly, no lymphadenopathy. Lungs:Clear bilaterally, no wheezes, rhonci, crackles Cardiovascular: Regular rate and rhythm. No murmurs, gallops or rubs. Abdomen:Soft. Bowel sounds present. Non-tender.  Extremities: No lower extremity edema. Pulses are 2 + in the bilateral DP/PT.  Cardiac cath 03/11/12: Left main: No obstructive disease.  Left Anterior Descending Artery: Large caliber vessel that courses to the apex. The mid vessel has a 50% stenosis which was visualized in multiple views and does not appear to be flow limiting. The first diagonal branch arises from this area of stenosis. This small to moderate sized branch has mild plaque disease. The mid and distal LAD has mild plaque disease.  Circumflex Artery: Large caliber vessel with moderate sized bifurcating first obtuse marginal branch with mild plaque disease. 50% stenosis mid AV groove Circumflex, does not appear flow limiting. The most distal obtuse marginal is small in caliber and has a 70% stenosis. This vessel is too small for PCI.  Right Coronary Artery: This appears to be at least a moderate sized vessel. The proximal vessel has 99% sub-total occlusion followed by a 100% occlusion in the mid vessel.  Left Ventricular Angiogram: Deferred.  Impression:  1. Triple vessel CAD with moderate disease in the LAD and Circumflex, chronic total occlusion of the RCA  2. Inferior wall scar on myoview c/w the chronic total occlusion of the RCA  Stress myoview 04/13/14: Stress Procedure: The patient received IV Lexiscan 0.4 mg over 15-seconds. Technetium 10m Sestamibi injected at 30-seconds. The patient had baseline Chest Tightness 1/10, but no change with Lexiscan.Quantitative spect images were obtained after a 45 minute delay. Stress ECG: No significant ST segment change suggestive of ischemia. QPS Raw Data  Images: Acquisition technically good; LVE. Stress Images: There is decreased uptake in the inferior lateral  wall and apex. Rest Images: There is decreased uptake in the inferior lateral wall. Subtraction (SDS): These findings are consistent with prior infarct and mild peri-infarct ischemia. Transient Ischemic Dilatation (Normal <1.22): 1.08 Lung/Heart Ratio (Normal <0.45): 0.42 Quantitative Gated Spect Images QGS EDV: 230 ml QGS ESV: 156 ml Impression Exercise Capacity: Lexiscan with no exercise. BP Response: Normal blood pressure response. Clinical Symptoms: No chest pain or dyspnea. ECG Impression: No significant ST segment change suggestive of ischemia. Comparison with Prior Nuclear Study: Compared to 02/26/12, no significant change. Overall Impression: High risk stress nuclear study with a large, severe, partially reversible inferior lateral defect consistent with prior infarct and mild peri-infarct ischemia towards the apex. Study high risk due to reduced LV function. LV Ejection Fraction: 32%. LV Wall Motion: Global hypokinesis with akinesis of the inferior lateral wall.  Echo 04/25/14: Left ventricle: The cavity size was mildly dilated. Wall thickness was normal. Systolic function was mildly reduced. The estimated ejection fraction was in the range of 45% to 50%. Doppler parameters are consistent with abnormal left ventricular relaxation (grade 1 diastolic dysfunction). - Left atrium: The atrium was mildly dilated.  EKG:  EKG is ordered today. The ekg ordered today demonstrates sinus, 1st degree AV block. PVCs. IVCD. Old inf infarct  Recent Labs: 01/20/2014: ALT 19; Hemoglobin 14.2; Platelets 185; Potassium 4.6; Sodium 131* 07/14/2014: BUN 7; Creat 0.86   Lipid Panel    Component Value Date/Time   CHOL 171 11/01/2012 1120   TRIG 244.0* 11/01/2012 1120   HDL 48.30 11/01/2012 1120   CHOLHDL 4 11/01/2012 1120   VLDL 48.8* 11/01/2012 1120   LDLCALC  109* 03/19/2010 2015   LDLDIRECT 84.7 11/01/2012 1120     Wt Readings from Last 3 Encounters:  01/18/15 328 lb 6.4 oz (148.961 kg)  01/18/15 327 lb (148.326 kg)  07/06/14 335 lb 3.2 oz (152.046 kg)     Other studies Reviewed: Additional studies/ records that were reviewed today include: . Review of the above records demonstrates:    Assessment and Plan:   1. CAD: Stable. He is known to have moderate mid LAD and moderate mid Circumflex disease with chronic occlusion of mid RCA by cath November 2013. Stress myoview December 2015 with no ischemia. Echo December 2015 with mild LV systolic dysfunction.  He is intolerant to statins.   2. Carotid artery disease: Mild bilateral disease by carotid dopplers November 2013.  Followed in [facility]  3. PAD: Followed in [facility]  4. Ischemic Cardiomyopathy/Chronic diastolic/systolic CHF: He is on an ARB. He does not tolerate beta blockers. Will continue torsemide 20 mg daily. Volume status is ok.   5. Hyperlipidemia: He is intolerant to statins.   6. HTN: BP has been controlled at home. elevated at home and is elevated today. He is now on ARB and hydralazine. He is intolerant of Norvasc, beta blockers. Will increase hydralazine to 50 mg po TID.   Current medicines are reviewed at length with the patient today.  The patient does not have concerns regarding medicines.  The following changes have been made:  no change  Labs/ tests ordered today include:  No orders of the defined types were placed in this encounter.    Disposition:   FU with me in 12  months  Signed, Lauree Chandler, MD 01/18/2015 5:00 PM    [address], [place], [place]  [address] Phone: [phone]; Fax: [phone]

## 2015-01-18 NOTE — Patient Instructions (Signed)
Peripheral Vascular Disease Peripheral Vascular Disease (PVD), also called Peripheral Arterial Disease (PAD), is a circulation problem caused by cholesterol (atherosclerotic plaque) deposits in the arteries. PVD commonly occurs in the lower extremities (legs) but it can occur in other areas of the body, such as your arms. The cholesterol buildup in the arteries reduces blood flow which can cause pain and other serious problems. The presence of PVD can place a person at risk for Coronary Artery Disease (CAD).  CAUSES  Causes of PVD can be many. It is usually associated with more than one risk factor such as:   High Cholesterol.  Smoking.  Diabetes.  Lack of exercise or inactivity.  High blood pressure (hypertension).  Obesity.  Family history. SYMPTOMS   When the lower extremities are affected, patients with PVD may experience:  Leg pain with exertion or physical activity. This is called INTERMITTENT CLAUDICATION. This may present as cramping or numbness with physical activity. The location of the pain is associated with the level of blockage. For example, blockage at the abdominal level (distal abdominal aorta) may result in buttock or hip pain. Lower leg arterial blockage may result in calf pain.  As PVD becomes more severe, pain can develop with less physical activity.  In people with severe PVD, leg pain may occur at rest.  Other PVD signs and symptoms:  Leg numbness or weakness.  Coldness in the affected leg or foot, especially when compared to the other leg.  A change in leg color.  Patients with significant PVD are more prone to ulcers or sores on toes, feet or legs. These may take longer to heal or may reoccur. The ulcers or sores can become infected.  If signs and symptoms of PVD are ignored, gangrene may occur. This can result in the loss of toes or loss of an entire limb.  Not all leg pain is related to PVD. Other medical conditions can cause leg pain such  as:  Blood clots (embolism) or Deep Vein Thrombosis.  Inflammation of the blood vessels (vasculitis).  Spinal stenosis. DIAGNOSIS  Diagnosis of PVD can involve several different types of tests. These can include:  Pulse Volume Recording Method (PVR). This test is simple, painless and does not involve the use of X-rays. PVR involves measuring and comparing the blood pressure in the arms and legs. An ABI (Ankle-Brachial Index) is calculated. The normal ratio of blood pressures is 1. As this number becomes smaller, it indicates more severe disease.  < 0.95 - indicates significant narrowing in one or more leg vessels.  <0.8 - there will usually be pain in the foot, leg or buttock with exercise.  <0.4 - will usually have pain in the legs at rest.  <0.25 - usually indicates limb threatening PVD.  Doppler detection of pulses in the legs. This test is painless and checks to see if you have a pulses in your legs/feet.  A dye or contrast material (a substance that highlights the blood vessels so they show up on x-ray) may be given to help your caregiver better see the arteries for the following tests. The dye is eliminated from your body by the kidney's. Your caregiver may order blood work to check your kidney function and other laboratory values before the following tests are performed:  Magnetic Resonance Angiography (MRA). An MRA is a picture study of the blood vessels and arteries. The MRA machine uses a large magnet to produce images of the blood vessels.  Computed Tomography Angiography (CTA). A CTA   is a specialized x-ray that looks at how the blood flows in your blood vessels. An IV may be inserted into your arm so contrast dye can be injected.  Angiogram. Is a procedure that uses x-rays to look at your blood vessels. This procedure is minimally invasive, meaning a small incision (cut) is made in your groin. A small tube (catheter) is then inserted into the artery of your groin. The catheter  is guided to the blood vessel or artery your caregiver wants to examine. Contrast dye is injected into the catheter. X-rays are then taken of the blood vessel or artery. After the images are obtained, the catheter is taken out. TREATMENT  Treatment of PVD involves many interventions which may include:  Lifestyle changes:  Quitting smoking.  Exercise.  Following a low fat, low cholesterol diet.  Control of diabetes.  Foot care is very important to the PVD patient. Good foot care can help prevent infection.  Medication:  Cholesterol-lowering medicine.  Blood pressure medicine.  Anti-platelet drugs.  Certain medicines may reduce symptoms of Intermittent Claudication.  Interventional/Surgical options:  Angioplasty. An Angioplasty is a procedure that inflates a balloon in the blocked artery. This opens the blocked artery to improve blood flow.  Stent Implant. A wire mesh tube (stent) is placed in the artery. The stent expands and stays in place, allowing the artery to remain open.  Peripheral Bypass Surgery. This is a surgical procedure that reroutes the blood around a blocked artery to help improve blood flow. This type of procedure may be performed if Angioplasty or stent implants are not an option. SEEK IMMEDIATE MEDICAL CARE IF:   You develop pain or numbness in your arms or legs.  Your arm or leg turns cold, becomes blue in color.  You develop redness, warmth, swelling and pain in your arms or legs. MAKE SURE YOU:   Understand these instructions.  Will watch your condition.  Will get help right away if you are not doing well or get worse. Document Released: 05/22/2004 Document Revised: 07/07/2011 Document Reviewed: 04/18/2008 ExitCare Patient Information 2015 [employer]. This information is not intended to replace advice given to you by your health care provider. Make sure you discuss any questions you have with your health care provider.    Abdominal Aortic  Aneurysm An aneurysm is a weakened or damaged part of an artery wall that bulges from the normal force of blood pumping through the body. An abdominal aortic aneurysm is an aneurysm that occurs in the lower part of the aorta, the main artery of the body.  The major concern with an abdominal aortic aneurysm is that it can enlarge and burst (rupture) or blood can flow between the layers of the wall of the aorta through a tear (aorticdissection). Both of these conditions can cause bleeding inside the body and can be life threatening unless diagnosed and treated promptly. CAUSES  The exact cause of an abdominal aortic aneurysm is unknown. Some contributing factors are:   A hardening of the arteries caused by the buildup of fat and other substances in the lining of a blood vessel (arteriosclerosis).  Inflammation of the walls of an artery (arteritis).   Connective tissue diseases, such as Marfan syndrome.   Abdominal trauma.   An infection, such as syphilis or staphylococcus, in the wall of the aorta (infectious aortitis) caused by bacteria. RISK FACTORS  Risk factors that contribute to an abdominal aortic aneurysm may include:  Age older than 60 years.   High blood   pressure (hypertension).  Male gender.  Ethnicity (white race).  Obesity.  Family history of aneurysm (first degree relatives only).  Tobacco use. PREVENTION  The following healthy lifestyle habits may help decrease your risk of abdominal aortic aneurysm:  Quitting smoking. Smoking can raise your blood pressure and cause arteriosclerosis.  Limiting or avoiding alcohol.  Keeping your blood pressure, blood sugar level, and cholesterol levels within normal limits.  Decreasing your salt intake. In somepeople, too much salt can raise blood pressure and increase your risk of abdominal aortic aneurysm.  Eating a diet low in saturated fats and cholesterol.  Increasing your fiber intake by including whole grains,  vegetables, and fruits in your diet. Eating these foods may help lower blood pressure.  Maintaining a healthy weight.  Staying physically active and exercising regularly. SYMPTOMS  The symptoms of abdominal aortic aneurysm may vary depending on the size and rate of growth of the aneurysm.Most grow slowly and do not have any symptoms. When symptoms do occur, they may include:  Pain (abdomen, side, lower back, or groin). The pain may vary in intensity. A sudden onset of severe pain may indicate that the aneurysm has ruptured.  Feeling full after eating only small amounts of food.  Nausea or vomiting or both.  Feeling a pulsating lump in the abdomen.  Feeling faint or passing out. DIAGNOSIS  Since most unruptured abdominal aortic aneurysms have no symptoms, they are often discovered during diagnostic exams for other conditions. An aneurysm may be found during the following procedures:  Ultrasonography (A one-time screening for abdominal aortic aneurysm by ultrasonography is also recommended for all men aged 65-75 years who have ever smoked).  X-ray exams.  A computed tomography (CT).  Magnetic resonance imaging (MRI).  Angiography or arteriography. TREATMENT  Treatment of an abdominal aortic aneurysm depends on the size of your aneurysm, your age, and risk factors for rupture. Medication to control blood pressure and pain may be used to manage aneurysms smaller than 6 cm. Regular monitoring for enlargement may be recommended by your caregiver if:  The aneurysm is 3-4 cm in size (an annual ultrasonography may be recommended).  The aneurysm is 4-4.5 cm in size (an ultrasonography every 6 months may be recommended).  The aneurysm is larger than 4.5 cm in size (your caregiver may ask that you be examined by a vascular surgeon). If your aneurysm is larger than 6 cm, surgical repair may be recommended. There are two main methods for repair of an aneurysm:   Endovascular repair (a  minimally invasive surgery). This is done most often.  Open repair. This method is used if an endovascular repair is not possible. Document Released: 01/22/2005 Document Revised: 08/09/2012 Document Reviewed: 05/14/2012 ExitCare Patient Information 2015 [employer]. This information is not intended to replace advice given to you by your health care provider. Make sure you discuss any questions you have with your health care provider.  

## 2015-01-18 NOTE — Progress Notes (Signed)
[facility] [place] HISTORY AND PHYSICAL   MRN : [id number]  History of Present Illness:   Mathew Padilla is a 64 y.o. male patient of Dr. Oneida Alar who presents for follow-up evaluation of PAD. He underwent Zenith aneurysm stent graft repair in 2007. The patient denies new abdominal or back pain. He has also undergone a left-to-right femoral-femoral bypass for limb occlusion of a Zenith graft. At the same time he had a right femoral to below-knee popliteal bypass with propaten. This was in December of 2012. He subsequently underwent a left above-knee to below-knee popliteal bypass with vein in March of 2013. He reports no claudication symptoms. He does have some intermittent swelling of his lower extremities. The patient's atherosclerotic risk factors remain obesity, hypertension. These are all currently stable and followed by his primary care physician.   Dr. Oneida Alar last saw pt on 07/06/14. At that time The aneurysm diameter of 5.2 cm was obtained on duplex. He also had a graft duplex scan which showed a patent left-to-right femoral-femoral bypass. ABIs at that visit were 0.6 on the left, 1.12 on the right, right femoral to below-knee popliteal bypass was patent with no increased velocities. A portion of the above-knee to below-knee popliteal bypass was visualized with some flow within it however the superficial femoral artery proximal to this has occluded.  Doing well status post Zenith stent graft repair aneurysm as well as bilateral popliteal aneurysm repairs. Chronic occlusion of left leg bypass graft. He does have some mild claudication symptoms on further questioning. However this is not significantly bothersome to him.   Dr. Oneida Alar planned to follow-up on the patient's CT scan within the next few weeks. If that was still in the 3 cm range then the patient will return in one year to review his stent graft with an ultrasound in the office, as well as repeat his duplex scans of  both lower extremities with ABIs.  He had several episodes of prostate and urinary tract issues several months ago requiring hospitalization but has recovered from this. Pt states he has issues with his lower back, "30 years ago I threw out a disc", and has occasional exacerbations of low back pain. Pt states his walking is limited by swelling, burning, stinging pain in both feet; this has been occurring for several years.  In the last month he has developed pain in the arch or his left foot, worse at night.   Pt states he will be seeing Dr. Percival Spanish this afternoon.  Pt states his dyspnea is no worse than it has been for years, he denies chest pain or feeling light headed.   He currently is undergoing evaluation for a gastric sleeve for morbid obesity. He apparently has failed lap banding.   Pt Diabetic: Yes Pt smoker: former smoker, quit in 2000 when he had an MI  Pt meds include: Statin :No, hives reaction to statin Betablocker: No, pt states an allergy to beta blockers and they eventually stop working on lowering his blood pressure ASA: Yes Other anticoagulants/antiplatelets: no   Current Outpatient Prescriptions  Medication Sig Dispense Refill  . Ergocalciferol (VITAMIN D2 PO) Take 50,000 Units by mouth once a week.    . furosemide (LASIX) 40 MG tablet TAKE 1 TABLET (40 MG TOTAL) BY MOUTH DAILY. 90 tablet 1  . gabapentin (NEURONTIN) 400 MG capsule Take 400 mg by mouth 4 (four) times daily.    . hydrALAZINE (APRESOLINE) 50 MG tablet Take 1 tablet (50 mg total) by  mouth 3 (three) times daily. 90 tablet 11  . ibuprofen (ADVIL,MOTRIN) 200 MG tablet Take 200 mg by mouth daily as needed for mild pain or moderate pain.     Marland Kitchen irbesartan (AVAPRO) 300 MG tablet Take 300 mg by mouth daily.    Marland Kitchen KLOR-CON M20 20 MEQ tablet TAKE 1 TABLET (20 MEQ TOTAL) BY MOUTH DAILY. 30 tablet 1  . metFORMIN (GLUCOPHAGE-XR) 500 MG 24 hr tablet Take 500 mg by mouth 2 (two) times daily.    . nitroGLYCERIN  (NITROSTAT) 0.4 MG SL tablet Place 1 tablet (0.4 mg total) under the tongue every 5 (five) minutes as needed for chest pain. 25 tablet 11  . Tamsulosin HCl (FLOMAX) 0.4 MG CAPS Take 0.4 mg by mouth daily after supper.      No current facility-administered medications for this visit.    Past Medical History  Diagnosis Date  . Arteriosclerotic cardiovascular disease (ASCVD)     a. MI in 1999;  b.  PTCA of PDA and 2nd marginal in 3/00;  c. 02/2012 Cath: LM nl, LAD 12m, LCX 69m, OM small, 70, RCA 99p/147m-->Med Rx;  d. 03/2014 MV: EF 32%, inflat infarct w/ mild peri-infarct isch towards apex (similar to 2013 MV); d. 03/2014 Echo: EF 45-50%, Gr 1 DD, mildly dil LA.  Marland Kitchen AAA (abdominal aortic aneurysm) 2007    stent graft repair 12/07  . Laryngeal carcinoma 2010    resection and radiation therapy 2010-11  . Diverticulitis 2006  . Obesity     BMI 47; lap band surgery 2010  . Gastroesophageal reflux disease     Hiatal hernia; previously treated with PPI  . Tobacco abuse, in remission     40 pack years; discontinued in 2000  . Cerebrovascular disease   . Cholelithiasis 11/28/2010  . Elevated PSA 06/2011    a. s/p TURP 01/2014.  Marland Kitchen Sleep apnea     BiPAP mask utilized        2 yrs sleep study    dr Redmond Pulling  . Hypertension     Mild; controlled with a single agent    . Cough, persistent   . 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 Smoking status: Former Smoker -- 2.00 packs/day for 30 years    Types: Cigarettes    Quit date: 11/26/1997  . Smokeless tobacco: Never Used     Comment: 40 pack years; quit 2000  . Alcohol Use: No    Family History Family History  Problem Relation Age of Onset  . Heart attack Father     deceased  . Heart failure Father   . Hyperlipidemia Father   . Hypertension Father   . Heart disease Father   . Lung cancer Mother     deceased   . Hypertension Mother   . Hyperlipidemia Mother   . Heart  failure Mother   . Diabetes Mother   . Cancer Sister     vaginal  . Diabetes Sister   . Colon cancer Neg Hx     Surgical History Past Surgical History  Procedure Laterality Date  . Peripheral arterial stent graft  2007    for abdominal aortic aneurysm  . Larynx surgery  2010    Resection of carcinoma  . Open anterior shoulder reconstruction  2011    Left  . Laparoscopic gastric banding  2010  . Eye surgery    . Cataract extraction    .  Colonoscopy  10/2007  . Femoral-femoral bypass graft  04/18/2011    Procedure: BYPASS GRAFT FEMORAL-FEMORAL ARTERY;  Surgeon: Theotis Burrow, MD;  Location: [facility] OR;  Service: Vascular;  Laterality: Bilateral;  Left to right femoral to femoral bypass   . Femoral-popliteal bypass graft  04/18/2011    Procedure: BYPASS GRAFT FEMORAL-POPLITEAL ARTERY;  Surgeon: Theotis Burrow, MD;  Location: [facility] OR;  Service: Vascular;  Laterality: Right;  Right femoral popliteal bypass with propaten gortex graft  . Cardiac catheterization  2000    PCI-stent  . Pr vein bypass graft,aorto-fem-pop  07-23-11    Left AK to BK popliteal BPG using rev. saphenous vein  . Colonoscopy with esophagogastroduodenoscopy (egd)  2009    RMR: He had distal esophageal erosions, patulous GE junction, erosive reflux esophagitis, hiatal hernia, lipoma on the angularis not manipulated, left-sided diverticula.  . Left heart catheterization with coronary angiogram N/A 03/10/2012    Procedure: LEFT HEART CATHETERIZATION WITH CORONARY ANGIOGRAM;  Surgeon: Burnell Blanks, MD;  Location: [facility] CATH LAB;  Service: Cardiovascular;  Laterality: N/A;    Allergies  Allergen Reactions  . Aspirin     Other reaction(s): GI Upset (intolerance) Cannot take daily, just once in a while Daily dose upsets stomach  . Other Rash    Beta-blockers  . Statins Hives and Rash    Current Outpatient Prescriptions  Medication Sig Dispense Refill  . Ergocalciferol (VITAMIN D2 PO) Take 50,000 Units by  mouth once a week.    . furosemide (LASIX) 40 MG tablet TAKE 1 TABLET (40 MG TOTAL) BY MOUTH DAILY. 90 tablet 1  . gabapentin (NEURONTIN) 400 MG capsule Take 400 mg by mouth 4 (four) times daily.    . hydrALAZINE (APRESOLINE) 50 MG tablet Take 1 tablet (50 mg total) by mouth 3 (three) times daily. 90 tablet 11  . ibuprofen (ADVIL,MOTRIN) 200 MG tablet Take 200 mg by mouth daily as needed for mild pain or moderate pain.     Marland Kitchen irbesartan (AVAPRO) 300 MG tablet Take 300 mg by mouth daily.    Marland Kitchen KLOR-CON M20 20 MEQ tablet TAKE 1 TABLET (20 MEQ TOTAL) BY MOUTH DAILY. 30 tablet 1  . metFORMIN (GLUCOPHAGE-XR) 500 MG 24 hr tablet Take 500 mg by mouth 2 (two) times daily.    . nitroGLYCERIN (NITROSTAT) 0.4 MG SL tablet Place 1 tablet (0.4 mg total) under the tongue every 5 (five) minutes as needed for chest pain. 25 tablet 11  . Tamsulosin HCl (FLOMAX) 0.4 MG CAPS Take 0.4 mg by mouth daily after supper.      No current facility-administered medications for this visit.     REVIEW OF SYSTEMS: See HPI for pertinent positives and negatives.  Physical Examination  Filed Vitals:   01/18/15 0915  BP: 122/78  Pulse: 80  Temp: 98.3 F (36.8 C)  TempSrc: Oral  Height: 5\' 11"  (1.803 m)  Weight: 327 lb (148.326 kg)  SpO2: 96%   Body mass index is 45.63 kg/(m^2).  General:  WDWN morbidly obese male in NAD Gait: Normal HENT: WNL Eyes: Pupils equal Pulmonary: normal non-labored breathing , without rales, rhonchi,  Or wheezing Cardiac: Regularly irregular, no murmur detected  Abdomen: soft, NT, no masses palpated Skin: no rashes, ulcers noted;  no Gangrene , no cellulitis; no open wounds.   VASCULAR EXAM  Carotid Bruits Right Left   Negative Negative   Bilateral radial pulses are 2+ palpable Aorta is not palpable Fem-fem bypass graft is faintly palpable, pt has  a very large panus                    VASCULAR EXAM: Extremities without ischemic changes, without Gangrene; without open  wounds.                                                                                                          LE Pulses Right Left       FEMORAL  1+ palpable  1+ palpable        POPLITEAL  not palpable   not palpable       POSTERIOR TIBIAL  1+ palpable   not palpable        DORSALIS PEDIS      ANTERIOR TIBIAL 1+ palpable  faintly palpable      Musculoskeletal: no muscle wasting or atrophy; no peripheral edema  Neurologic: A&O X 3; Appropriate Affect ;  MOTOR FUNCTION: 5/5 Symmetric, CN 2-12 intact Speech is fluent/normal   Significant Diagnostic Studies 07/17/14 CTA abd/pelvis: VASCULAR  1. Unchanged appearance of technically successful endovascular aortic repair of abdominal aortic aneurysm. The excluded aneurysm sac remains barely visible with a maximal diameter of 3.7 x 2.9 cm measured at approximately the level of the endograft flow divider. No evidence of interval enlargement of the aneurysm sac. 2. Unchanged chronic occlusion of the right endo graft limb. 3. Widely patent left-to-right fem-fem bypass graft. 4. Left superficial femoral artery occlusion beginning approximately 2 cm beyond the origin. 5. The visualized portion of the right femoral to popliteal bypass graft is widely patent.   Non-Invasive Vascular Imaging (01/16/15):  ABI (Date: 01/18/2015)  R: 1.08 (0.95, 07/06/14), DP: triphasic, PT: triphasic, TBI: 1.16  L: 0.76 (0.61), DP: monophasic, PT: monophasic, TBI: 0.73   ASSESSMENT Mathew Padilla is a 64 y.o. male who is s/p EVAR in 2007, left to right fem-fem bypass graft and right femoral popliteal bypass graft on 04/18/11; left above knee to below knee popliteal bypass graft on 07/16/11. Last duplex showed occlusion of left SFA and graft. 07/17/14 CTA shows unchanged appearance of technically successful endovascular aortic repair of abdominal aortic aneurysm. The excluded aneurysm sac remains barely visible with a maximal diameter of 3.7 x 2.9 cm.  Widely patent left-to-right fem-fem bypass graft. Left superficial femoral artery occlusion beginning approximately 2 cm beyond the origin.  The visualized portion of the right femoral to popliteal bypass graft is widely patent.  His left foot pain that is worse at night and developed over the last month, is not likely related to his arterial perfusion as this has improved in the last 6 months according to his ABI's.  He has no pain referable to his aortic stent graft.  PLAN:   Graduated walking program.  Based on today's exam and non-invasive vascular lab results, and after discussing with Dr. Oneida Alar, the patient will follow up in 1 year with the following tests: ABI's. CTA abd/pevis for EVAR surveillance every 2 years, due in March 2018. I discussed in depth with the patient the nature of atherosclerosis, and emphasized  the importance of maximal medical management including strict control of blood pressure, blood glucose, and lipid levels, obtaining regular exercise, and cessation of smoking.  The patient is aware that without maximal medical management the underlying atherosclerotic disease process will progress, limiting the benefit of any interventions.  The patient was given information about PAD including signs, symptoms, treatment, what symptoms should prompt the patient to seek immediate medical care, and risk reduction measures to take. Thank you for allowing [place] to participate in this patient's care.  Clemon Chambers, RN, MSN, FNP-C [facility] Office: [phone]  Clinic MD: [facility]  01/18/2015 9:16 AM

## 2015-01-19 NOTE — Addendum Note (Signed)
Addended by: Reola Calkins on: 01/19/2015 01:33 PM   Modules accepted: Orders

## 2015-03-02 DIAGNOSIS — E1140 Type 2 diabetes mellitus with diabetic neuropathy, unspecified: Secondary | ICD-10-CM | POA: Diagnosis not present

## 2015-03-06 DIAGNOSIS — E1140 Type 2 diabetes mellitus with diabetic neuropathy, unspecified: Secondary | ICD-10-CM | POA: Diagnosis not present

## 2015-03-20 DIAGNOSIS — Z9884 Bariatric surgery status: Secondary | ICD-10-CM | POA: Diagnosis not present

## 2015-03-20 DIAGNOSIS — I2510 Atherosclerotic heart disease of native coronary artery without angina pectoris: Secondary | ICD-10-CM | POA: Diagnosis not present

## 2015-03-20 DIAGNOSIS — E1149 Type 2 diabetes mellitus with other diabetic neurological complication: Secondary | ICD-10-CM | POA: Diagnosis not present

## 2015-04-11 DIAGNOSIS — Z6841 Body Mass Index (BMI) 40.0 and over, adult: Secondary | ICD-10-CM | POA: Diagnosis not present

## 2015-04-11 DIAGNOSIS — E1142 Type 2 diabetes mellitus with diabetic polyneuropathy: Secondary | ICD-10-CM | POA: Diagnosis not present

## 2015-04-11 DIAGNOSIS — E11319 Type 2 diabetes mellitus with unspecified diabetic retinopathy without macular edema: Secondary | ICD-10-CM | POA: Diagnosis not present

## 2015-04-11 DIAGNOSIS — I10 Essential (primary) hypertension: Secondary | ICD-10-CM | POA: Diagnosis not present

## 2015-04-11 DIAGNOSIS — G4733 Obstructive sleep apnea (adult) (pediatric): Secondary | ICD-10-CM | POA: Diagnosis not present

## 2015-04-11 DIAGNOSIS — I2510 Atherosclerotic heart disease of native coronary artery without angina pectoris: Secondary | ICD-10-CM | POA: Diagnosis not present

## 2015-04-17 DIAGNOSIS — Z0000 Encounter for general adult medical examination without abnormal findings: Secondary | ICD-10-CM | POA: Diagnosis not present

## 2015-04-17 DIAGNOSIS — Z1211 Encounter for screening for malignant neoplasm of colon: Secondary | ICD-10-CM | POA: Diagnosis not present

## 2015-05-01 ENCOUNTER — Ambulatory Visit: Payer: Self-pay | Admitting: Cardiovascular Disease

## 2015-05-01 DIAGNOSIS — I10 Essential (primary) hypertension: Secondary | ICD-10-CM | POA: Diagnosis not present

## 2015-05-01 DIAGNOSIS — G4733 Obstructive sleep apnea (adult) (pediatric): Secondary | ICD-10-CM | POA: Diagnosis not present

## 2015-05-08 DIAGNOSIS — E119 Type 2 diabetes mellitus without complications: Secondary | ICD-10-CM | POA: Diagnosis not present

## 2015-05-08 DIAGNOSIS — Z6841 Body Mass Index (BMI) 40.0 and over, adult: Secondary | ICD-10-CM | POA: Diagnosis not present

## 2015-05-30 DIAGNOSIS — I2510 Atherosclerotic heart disease of native coronary artery without angina pectoris: Secondary | ICD-10-CM | POA: Diagnosis not present

## 2015-05-30 DIAGNOSIS — I10 Essential (primary) hypertension: Secondary | ICD-10-CM | POA: Diagnosis not present

## 2015-05-30 DIAGNOSIS — E559 Vitamin D deficiency, unspecified: Secondary | ICD-10-CM | POA: Diagnosis not present

## 2015-05-30 DIAGNOSIS — Z87891 Personal history of nicotine dependence: Secondary | ICD-10-CM | POA: Diagnosis not present

## 2015-05-30 DIAGNOSIS — E1100 Type 2 diabetes mellitus with hyperosmolarity without nonketotic hyperglycemic-hyperosmolar coma (NKHHC): Secondary | ICD-10-CM | POA: Diagnosis not present

## 2015-05-30 DIAGNOSIS — R5382 Chronic fatigue, unspecified: Secondary | ICD-10-CM | POA: Diagnosis not present

## 2015-06-21 ENCOUNTER — Encounter: Payer: Self-pay | Admitting: Vascular Surgery

## 2015-06-27 ENCOUNTER — Ambulatory Visit (HOSPITAL_COMMUNITY)
Admission: RE | Admit: 2015-06-27 | Discharge: 2015-06-27 | Disposition: A | Discharge status: Home / Self Care | Payer: Medicare Other | Source: Ambulatory Visit | Attending: Vascular Surgery | Admitting: Vascular Surgery

## 2015-06-27 ENCOUNTER — Ambulatory Visit (INDEPENDENT_AMBULATORY_CARE_PROVIDER_SITE_OTHER)
Admission: RE | Admit: 2015-06-27 | Discharge: 2015-06-27 | Disposition: A | Discharge status: Home / Self Care | Payer: Medicare Other | Source: Ambulatory Visit | Attending: Vascular Surgery | Admitting: Vascular Surgery

## 2015-06-27 DIAGNOSIS — Z48812 Encounter for surgical aftercare following surgery on the circulatory system: Secondary | ICD-10-CM | POA: Insufficient documentation

## 2015-06-27 DIAGNOSIS — E119 Type 2 diabetes mellitus without complications: Secondary | ICD-10-CM | POA: Diagnosis not present

## 2015-06-27 DIAGNOSIS — I714 Abdominal aortic aneurysm, without rupture, unspecified: Secondary | ICD-10-CM

## 2015-06-27 DIAGNOSIS — I110 Hypertensive heart disease with heart failure: Secondary | ICD-10-CM | POA: Insufficient documentation

## 2015-06-27 DIAGNOSIS — Z720 Tobacco use: Secondary | ICD-10-CM | POA: Diagnosis not present

## 2015-06-27 DIAGNOSIS — I509 Heart failure, unspecified: Secondary | ICD-10-CM | POA: Insufficient documentation

## 2015-06-28 ENCOUNTER — Ambulatory Visit (INDEPENDENT_AMBULATORY_CARE_PROVIDER_SITE_OTHER): Payer: Medicare Other | Admitting: Vascular Surgery

## 2015-06-28 ENCOUNTER — Encounter: Payer: Self-pay | Admitting: Vascular Surgery

## 2015-06-28 ENCOUNTER — Other Ambulatory Visit: Payer: Self-pay | Admitting: Vascular Surgery

## 2015-06-28 VITALS — BP 155/91 | HR 82 | Temp 97.1°F | Resp 16 | Ht 70.0 in | Wt 328.0 lb

## 2015-06-28 DIAGNOSIS — I739 Peripheral vascular disease, unspecified: Secondary | ICD-10-CM | POA: Diagnosis not present

## 2015-06-28 DIAGNOSIS — Z01812 Encounter for preprocedural laboratory examination: Secondary | ICD-10-CM | POA: Diagnosis not present

## 2015-06-28 DIAGNOSIS — I714 Abdominal aortic aneurysm, without rupture, unspecified: Secondary | ICD-10-CM

## 2015-06-28 LAB — CREATININE, SERUM: Creat: 0.75 mg/dL (ref 0.70–1.25)

## 2015-06-28 LAB — BUN: BUN: 13 mg/dL (ref 7–25)

## 2015-06-28 NOTE — Progress Notes (Signed)
Filed Vitals:   06/28/15 0911 06/28/15 0919  BP: 164/88 155/91  Pulse: 82 82  Temp: 97.1 F (36.2 C)   TempSrc: Oral   Resp: 16   Height: 5\' 10"  (1.778 m)   Weight: 328 lb (148.78 kg)   SpO2: 98%

## 2015-06-29 NOTE — Progress Notes (Signed)
History of Present Illness:  Patient is a 65 y.o. year old male who presents for follow-up evaluation of AAA. He underwent Zenith aneurysm stent graft repair in 2007. The patient denies new abdominal or back pain. He has also undergone a left-to-right femoral-femoral bypass for limb occlusion of a Zenith graft. At the same time he had a right femoral to below-knee popliteal bypass with propaten. This was in December of 2012. He subsequently underwent a left above-knee to below-knee popliteal bypass with vein in March of 2013. This is chronically occluded. He reports no claudication symptoms. He does have some intermittent swelling of his lower extremities. The patient's atherosclerotic risk factors remain obesity, hypertension.  These are all currently stable and followed by his primary care physician. He is currently undergoing evaluation for gastric bypass. He has failed lap banding in the past. He also has bilateral lower extremity neuropathy which he says has been improved with Tegretol combined with Lyrica.    Past Medical History   Diagnosis  Date   .  Arteriosclerotic cardiovascular disease (ASCVD)         a. MI in 1999;  b.  PTCA of PDA and 2nd marginal in 3/00;  c. 02/2012 Cath: LM nl, LAD 57m, LCX 1m, OM small, 70, RCA 99p/150m-->Med Rx;  d. 03/2014 MV: EF 32%, inflat infarct w/ mild peri-infarct isch towards apex (similar to 2013 MV); d. 03/2014 Echo: EF 45-50%, Gr 1 DD, mildly dil LA.   Marland Kitchen  AAA (abdominal aortic aneurysm)  2007       stent graft repair 12/07   .  Laryngeal carcinoma  2010       resection and radiation therapy 2010-11   .  Diverticulitis  2006   .  Obesity         BMI 47; lap band surgery 2010   .  Gastroesophageal reflux disease         Hiatal hernia; previously treated with PPI   .  Tobacco abuse, in remission         40 pack years; discontinued in 2000   .  Cerebrovascular disease     .  Cholelithiasis  11/28/2010   .  Elevated PSA  06/2011       a. s/p TURP 01/2014.    Marland Kitchen  Sleep apnea         BiPAP mask utilized        2 yrs sleep study    dr Redmond Pulling   .  Hypertension         Mild; controlled with a single agent     .  Cough, persistent     .  Popliteal aneurysm         popliteal bypass-2013    Past Surgical History   Procedure  Laterality  Date   .  Peripheral arterial stent graft    2007       for abdominal aortic aneurysm   .  Larynx surgery    2010       Resection of carcinoma   .  Open anterior shoulder reconstruction    2011       Left   .  Laparoscopic gastric banding    2010   .  Eye surgery       .  Cataract extraction       .  Colonoscopy    10/2007   .  Femoral-femoral bypass graft    04/18/2011  Procedure: BYPASS GRAFT FEMORAL-FEMORAL ARTERY;  Surgeon: Theotis Burrow, MD;  Location: [facility] OR;  Service: Vascular;  Laterality: Bilateral;  Left to right femoral to femoral bypass    .  Femoral-popliteal bypass graft    04/18/2011       Procedure: BYPASS GRAFT FEMORAL-POPLITEAL ARTERY;  Surgeon: Theotis Burrow, MD;  Location: [facility] OR;  Service: Vascular;  Laterality: Right;  Right femoral popliteal bypass with propaten gortex graft   .  Cardiac catheterization    2000       PCI-stent   .  Pr vein bypass graft,aorto-fem-pop    07-23-11       Left AK to BK popliteal BPG using rev. saphenous vein   .  Colonoscopy with esophagogastroduodenoscopy (egd)    2009       RMR: He had distal esophageal erosions, patulous GE junction, erosive reflux esophagitis, hiatal hernia, lipoma on the angularis not manipulated, left-sided diverticula.   .  Left heart catheterization with coronary angiogram  N/A  03/10/2012       Procedure: LEFT HEART CATHETERIZATION WITH CORONARY ANGIOGRAM;  Surgeon: Burnell Blanks, MD;  Location: [facility] CATH LAB;  Service: Cardiovascular;  Laterality: N/A;        Review of Systems:  Neurologic: denies symptoms of TIA, amaurosis, or stroke Cardiac:denies shortness of breath or chest pain Pulmonary: denies cough or  wheeze Abdomen: denies abdominal pain nausea or vomiting    History       Social History    .   Marital Status:   Married          Spouse Name:   N/A          Number of Children:   N/A    .   Years of Education:   N/A       Occupational History    .   Not on file.       Social History Main Topics    .   Smoking status:   Former Smoker          Quit date:   11/26/1997    .   Smokeless tobacco:   Never Used       Comment: 40 pack years; quit 2000    .   Alcohol Use:   No    .   Drug Use:   No    .   Sexually Active:   Yes       Other Topics   Concern    .   Not on file       Social History Narrative       Married, past employed, does not get regular exercise.         Allergies    Allergen   Reactions    .   Aspirin             Daily dose upsets stomach    .   Statins   Hives and Rash        Current Outpatient Prescriptions on File Prior to Visit  Medication Sig Dispense Refill  . aspirin 325 MG tablet Take 325 mg by mouth daily.    . carbamazepine (TEGRETOL XR) 200 MG 12 hr tablet Take 200 mg by mouth at bedtime.    . Ergocalciferol (VITAMIN D2 PO) Take 50,000 Units by mouth once a week.    . hydrALAZINE (APRESOLINE) 50 MG tablet Take 1 tablet (50 mg total) by mouth 3 (three) times  daily. 90 tablet 11  . ibuprofen (ADVIL,MOTRIN) 200 MG tablet Take 200 mg by mouth daily as needed for mild pain or moderate pain.     Marland Kitchen irbesartan (AVAPRO) 300 MG tablet Take 300 mg by mouth daily.    Marland Kitchen KLOR-CON M20 20 MEQ tablet TAKE 1 TABLET (20 MEQ TOTAL) BY MOUTH DAILY. 30 tablet 1  . metFORMIN (GLUCOPHAGE-XR) 500 MG 24 hr tablet Take 500 mg by mouth 2 (two) times daily.    . nitroGLYCERIN (NITROSTAT) 0.4 MG SL tablet Place 1 tablet (0.4 mg total) under the tongue every 5 (five) minutes as needed for chest pain. 25 tablet 11  . pregabalin (LYRICA) 200 MG capsule Take 200 mg by mouth. FOUR TIMES DAILY    . Tamsulosin HCl (FLOMAX) 0.4 MG CAPS Take 0.4 mg by mouth daily after supper.     .  torsemide (DEMADEX) 20 MG tablet Take 20 mg by mouth daily.     No current facility-administered medications on file prior to visit.       Physical Examination     Filed Vitals:   06/28/15 0911 06/28/15 0919  BP: 164/88 155/91  Pulse: 82 82  Temp: 97.1 F (36.2 C)   TempSrc: Oral   Resp: 16   Height: 5\' 10"  (1.778 m)   Weight: 328 lb (148.78 kg)   SpO2: 98%    General:  Alert and oriented, no acute distress HEENT: Normal Neck: No bruit or JVD Pulmonary: Clear to auscultation bilaterally Cardiac: Regular Rate and Rhythm without murmur Abdomen: Soft, non-tender, non-distended, normal bowel sounds, no pulsatile mass, obese Extremities: 2+ femoral pulses, vaguely palpable dorsalis pedis pulse right side absent pedal pulses left. Trace edema. Skin: Feet pink warm and well-perfused   DATA:   Patient had an aortic ultrasound today which was difficult due to overlying bowel gas and the patient's obesity. The aneurysm diameter of 5.7 cm was obtained. He also had a graft duplex scan which showed a patent left-to-right femoral-femoral bypass. ABIs today were 0.6 on the left, 1.10 on the right,   ASSESSMENT:   Doing well status post Zenith stent graft repair aneurysm as well as bilateral popliteal aneurysm repairs. Chronic occlusion of left leg bypass graft.  His aneurysm diameter today by ultrasound was 5.7 cm which is significantly increased from his last ultrasound at 3.7 cm 2 years ago. A PET scan that was performed previously also showed his aneurysm diameter 3.3 cm on that scan. Most likely this is a discrepancy from ultrasound rather than actual aortic diameter increased. However, we will obtain a CT scan to make sure that this is just a discrepancy in ultrasound.   PLAN: I will follow-up on the patient's CT scan within the next few weeks. If this is still in the 3 cm range then the patient will return in one year to review his stent graft with an ultrasound in the office  Ruta Hinds, MD [facility] [place]: [phone] Pager: [phone]

## 2015-07-03 ENCOUNTER — Other Ambulatory Visit: Payer: Self-pay

## 2015-07-03 MED ORDER — POTASSIUM CHLORIDE CRYS ER 20 MEQ PO TBCR: EXTENDED_RELEASE_TABLET | ORAL | Status: DC

## 2015-07-11 ENCOUNTER — Ambulatory Visit
Admission: RE | Admit: 2015-07-11 | Discharge: 2015-07-11 | Disposition: A | Discharge status: Home / Self Care | Payer: Medicare Other | Source: Ambulatory Visit | Attending: Family | Admitting: Family

## 2015-07-11 ENCOUNTER — Encounter: Payer: Self-pay | Admitting: Vascular Surgery

## 2015-07-11 DIAGNOSIS — Z95828 Presence of other vascular implants and grafts: Secondary | ICD-10-CM

## 2015-07-11 DIAGNOSIS — I714 Abdominal aortic aneurysm, without rupture: Secondary | ICD-10-CM | POA: Diagnosis not present

## 2015-07-11 DIAGNOSIS — Z48812 Encounter for surgical aftercare following surgery on the circulatory system: Secondary | ICD-10-CM

## 2015-07-11 MED ORDER — IOPAMIDOL (ISOVUE-370) INJECTION 76%
200.0000 mL | Freq: Once | INTRAVENOUS | Status: AC | PRN
  Administered 2015-07-11: 200 mL via INTRAVENOUS

## 2015-07-12 ENCOUNTER — Encounter (HOSPITAL_COMMUNITY): Payer: Medicare Other

## 2015-07-12 ENCOUNTER — Ambulatory Visit: Payer: Medicare Other | Admitting: Vascular Surgery

## 2015-07-12 ENCOUNTER — Other Ambulatory Visit (HOSPITAL_COMMUNITY): Payer: Medicare Other

## 2015-07-18 ENCOUNTER — Encounter: Payer: Self-pay | Admitting: Vascular Surgery

## 2015-07-18 ENCOUNTER — Other Ambulatory Visit: Payer: Self-pay | Admitting: *Deleted

## 2015-07-18 ENCOUNTER — Ambulatory Visit (INDEPENDENT_AMBULATORY_CARE_PROVIDER_SITE_OTHER): Payer: Medicare Other | Admitting: Vascular Surgery

## 2015-07-18 VITALS — BP 145/79 | HR 102 | Temp 98.7°F | Resp 18 | Ht 70.0 in | Wt 333.0 lb

## 2015-07-18 DIAGNOSIS — G609 Hereditary and idiopathic neuropathy, unspecified: Secondary | ICD-10-CM

## 2015-07-18 DIAGNOSIS — I714 Abdominal aortic aneurysm, without rupture, unspecified: Secondary | ICD-10-CM

## 2015-07-18 NOTE — Progress Notes (Signed)
Filed Vitals:   07/18/15 0959 07/18/15 1002  BP: 150/78 145/79  Pulse: 107 102  Temp: 98.7 F (37.1 C)   Resp: 18   Height: 5\' 10"  (1.778 m)   Weight: 333 lb (151.048 kg)   SpO2: 96%

## 2015-07-18 NOTE — Progress Notes (Signed)
History of Present Illness:  Patient is a 65 y.o. year old male who presents for follow-up evaluation of AAA. He underwent Zenith aneurysm stent graft repair in 2007. The patient denies new abdominal or back pain. He has also undergone a left-to-right femoral-femoral bypass for limb occlusion of a Zenith graft. At the same time he had a right femoral to below-knee popliteal bypass with propaten. This was in December of 2012. He subsequently underwent a left above-knee to below-knee popliteal bypass with vein in March of 2013. This is chronically occluded. He reports no claudication symptoms. He does have some intermittent swelling of his lower extremities. He also complains of burning and stinging in both feet consistent with neuropathy. He has had minimal relief with Lyrica or Neurontin. The patient's atherosclerotic risk factors remain obesity, hypertension.  These are all currently stable and followed by his primary care physician. He is currently undergoing evaluation for gastric bypass. He has failed lap banding in the past. He also has bilateral lower extremity neuropathy which he says has been improved with Tegretol combined with Lyrica.    Past Medical History    Diagnosis   Date    .   Arteriosclerotic cardiovascular disease (ASCVD)             a. MI in 1999;  b.  PTCA of PDA and 2nd marginal in 3/00;  c. 02/2012 Cath: LM nl, LAD 38m, LCX 43m, OM small, 70, RCA 99p/134m-->Med Rx;  d. 03/2014 MV: EF 32%, inflat infarct w/ mild peri-infarct isch towards apex (similar to 2013 MV); d. 03/2014 Echo: EF 45-50%, Gr 1 DD, mildly dil LA.    Marland Kitchen   AAA (abdominal aortic aneurysm)   2007          stent graft repair 12/07    .   Laryngeal carcinoma   2010          resection and radiation therapy 2010-11    .   Diverticulitis   2006    .   Obesity             BMI 47; lap band surgery 2010    .   Gastroesophageal reflux disease             Hiatal hernia; previously treated with PPI    .   Tobacco abuse, in  remission             40 pack years; discontinued in 2000    .   Cerebrovascular disease       .   Cholelithiasis   11/28/2010    .   Elevated PSA   06/2011          a. s/p TURP 01/2014.    Marland Kitchen   Sleep apnea             BiPAP mask utilized        2 yrs sleep study    dr Redmond Pulling    .   Hypertension             Mild; controlled with a single agent      .   Cough, persistent       .   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   Peripheral arterial stent graft      2007  for abdominal aortic aneurysm    .   Larynx surgery      2010          Resection of carcinoma    .   Open anterior shoulder reconstruction      2011          Left    .   Laparoscopic gastric banding      2010    .   Eye surgery          .   Cataract extraction          .   Colonoscopy      10/2007    .   Femoral-femoral bypass graft      04/18/2011          Procedure: BYPASS GRAFT FEMORAL-FEMORAL ARTERY;  Surgeon: Theotis Burrow, MD;  Location: [facility] OR;  Service: Vascular;  Laterality: Bilateral;  Left to right femoral to femoral bypass     .   Femoral-popliteal bypass graft      04/18/2011          Procedure: BYPASS GRAFT FEMORAL-POPLITEAL ARTERY;  Surgeon: Theotis Burrow, MD;  Location: [facility] OR;  Service: Vascular;  Laterality: Right;  Right femoral popliteal bypass with propaten gortex graft    .   Cardiac catheterization      2000          PCI-stent    .   Pr vein bypass graft,aorto-fem-pop      07-23-11          Left AK to BK popliteal BPG using rev. saphenous vein    .   Colonoscopy with esophagogastroduodenoscopy (egd)      2009          RMR: He had distal esophageal erosions, patulous GE junction, erosive reflux esophagitis, hiatal hernia, lipoma on the angularis not manipulated, left-sided diverticula.    .   Left heart catheterization with coronary angiogram   N/A   03/10/2012          Procedure: LEFT HEART CATHETERIZATION WITH  CORONARY ANGIOGRAM;  Surgeon: Burnell Blanks, MD;  Location: [facility] CATH LAB;  Service: Cardiovascular;  Laterality: N/A;         Review of Systems:  Neurologic: denies symptoms of TIA, amaurosis, or stroke Cardiac:denies shortness of breath or chest pain Pulmonary: denies cough or wheeze Abdomen: denies abdominal pain nausea or vomiting    History        Social History     .    Marital Status:    Married             Spouse Name:    N/A             Number of Children:    N/A     .    Years of Education:    N/A        Occupational History     .    Not on file.        Social History Main Topics     .    Smoking status:    Former Smoker             Quit date:    11/26/1997     .    Smokeless tobacco:    Never Used         Comment: 40 pack years; quit 2000     .    Alcohol Use:  No     .    Drug Use:    No     .    Sexually Active:    Yes        Other Topics    Concern     .    Not on file        Social History Narrative         Married, past employed, does not get regular exercise.          Allergies     Allergen    Reactions     .    Aspirin                 Daily dose upsets stomach     .    Statins    Hives and Rash         Current Outpatient Prescriptions on File Prior to Visit   Medication  Sig  Dispense  Refill   .  aspirin 325 MG tablet  Take 325 mg by mouth daily.       .  carbamazepine (TEGRETOL XR) 200 MG 12 hr tablet  Take 200 mg by mouth at bedtime.       .  Ergocalciferol (VITAMIN D2 PO)  Take 50,000 Units by mouth once a week.       .  hydrALAZINE (APRESOLINE) 50 MG tablet  Take 1 tablet (50 mg total) by mouth 3 (three) times daily.  90 tablet  11   .  ibuprofen (ADVIL,MOTRIN) 200 MG tablet  Take 200 mg by mouth daily as needed for mild pain or moderate pain.        Marland Kitchen  irbesartan (AVAPRO) 300 MG tablet  Take 300 mg by mouth daily.       Marland Kitchen  KLOR-CON M20 20 MEQ tablet  TAKE 1 TABLET (20 MEQ TOTAL) BY MOUTH DAILY.  30 tablet  1   .  metFORMIN  (GLUCOPHAGE-XR) 500 MG 24 hr tablet  Take 500 mg by mouth 2 (two) times daily.       .  nitroGLYCERIN (NITROSTAT) 0.4 MG SL tablet  Place 1 tablet (0.4 mg total) under the tongue every 5 (five) minutes as needed for chest pain.  25 tablet  11   .  pregabalin (LYRICA) 200 MG capsule  Take 200 mg by mouth. FOUR TIMES DAILY       .  Tamsulosin HCl (FLOMAX) 0.4 MG CAPS  Take 0.4 mg by mouth daily after supper.        .  torsemide (DEMADEX) 20 MG tablet  Take 20 mg by mouth daily.          No current facility-administered medications on file prior to visit.        Physical Examination Filed Vitals:   07/18/15 0959 07/18/15 1002  BP: 150/78 145/79  Pulse: 107 102  Temp: 98.7 F (37.1 C)   Resp: 18   Height: 5\' 10"  (1.778 m)   Weight: 333 lb (151.048 kg)   SpO2: 96%     General:  Alert and oriented, no acute distress HEENT: Normal Neck: No bruit or JVD Pulmonary: Clear to auscultation bilaterally Cardiac: Regular Rate and Rhythm without murmur Abdomen: Soft, non-tender, non-distended, normal bowel sounds, no pulsatile mass, obese Extremities: 2+ femoral pulses, vaguely palpable dorsalis pedis pulse right side absent pedal pulses left. Trace edema. Skin: Feet pink warm and well-perfused   DATA:   Patient had an aortic ultrasound a  few weeks ago which was difficult due to overlying bowel gas and the patient's obesity. The aneurysm diameter of 5.7 cm was obtained. He returns today after a follow-up CT scan. The CT scan shows a widely patent stent graft with chronic occlusion of the right limb patent left-to-right femorofemoral occlusion of his right femoropopliteal bypass. The aneurysm was 3.7 cm in diameter.  ASSESSMENT:   Doing well status post Zenith stent graft repair aneurysm as well as bilateral popliteal aneurysm repairs. Chronic occlusion of left leg bypass graft.  now with chronic occlusion of right leg bypass as well. He does have some mild claudication symptoms but is not really  disabled by this. He is supposed to have a gastric bypass done in the near future and I told him this was more important than considering his claudication symptoms at this point. Will follow-up with me in 1 year to reexamine his stent graft with a CT scan.  He also requested evaluation by neurology to see if there is anything else that can be done for his neuropathy. We will schedule this point but for him.  PLAN: Patient will follow-up with me in 1 year with repeat CT Angio the abdomen and pelvis.  Ruta Hinds, MD [facility] Office: [phone] Pager: [phone]

## 2015-07-26 ENCOUNTER — Ambulatory Visit: Payer: Medicare Other | Admitting: Vascular Surgery

## 2015-07-26 ENCOUNTER — Other Ambulatory Visit (HOSPITAL_COMMUNITY): Payer: Medicare Other

## 2015-07-26 ENCOUNTER — Encounter (HOSPITAL_COMMUNITY): Payer: Medicare Other

## 2015-07-28 HISTORY — PX: OTHER SURGICAL HISTORY: SHX169

## 2015-07-31 DIAGNOSIS — I2510 Atherosclerotic heart disease of native coronary artery without angina pectoris: Secondary | ICD-10-CM | POA: Diagnosis not present

## 2015-07-31 DIAGNOSIS — I252 Old myocardial infarction: Secondary | ICD-10-CM | POA: Diagnosis not present

## 2015-07-31 DIAGNOSIS — R0609 Other forms of dyspnea: Secondary | ICD-10-CM | POA: Diagnosis not present

## 2015-07-31 DIAGNOSIS — Z888 Allergy status to other drugs, medicaments and biological substances status: Secondary | ICD-10-CM | POA: Diagnosis not present

## 2015-07-31 DIAGNOSIS — Z7984 Long term (current) use of oral hypoglycemic drugs: Secondary | ICD-10-CM | POA: Diagnosis not present

## 2015-07-31 DIAGNOSIS — R0602 Shortness of breath: Secondary | ICD-10-CM | POA: Diagnosis not present

## 2015-07-31 DIAGNOSIS — R0789 Other chest pain: Secondary | ICD-10-CM | POA: Diagnosis not present

## 2015-07-31 DIAGNOSIS — Z85819 Personal history of malignant neoplasm of unspecified site of lip, oral cavity, and pharynx: Secondary | ICD-10-CM | POA: Diagnosis not present

## 2015-07-31 DIAGNOSIS — Z87891 Personal history of nicotine dependence: Secondary | ICD-10-CM | POA: Diagnosis not present

## 2015-07-31 DIAGNOSIS — E1140 Type 2 diabetes mellitus with diabetic neuropathy, unspecified: Secondary | ICD-10-CM | POA: Diagnosis not present

## 2015-07-31 DIAGNOSIS — Z79899 Other long term (current) drug therapy: Secondary | ICD-10-CM | POA: Diagnosis not present

## 2015-07-31 DIAGNOSIS — Z9884 Bariatric surgery status: Secondary | ICD-10-CM | POA: Diagnosis not present

## 2015-07-31 DIAGNOSIS — R918 Other nonspecific abnormal finding of lung field: Secondary | ICD-10-CM | POA: Diagnosis not present

## 2015-07-31 DIAGNOSIS — R0600 Dyspnea, unspecified: Secondary | ICD-10-CM | POA: Diagnosis not present

## 2015-07-31 DIAGNOSIS — R079 Chest pain, unspecified: Secondary | ICD-10-CM | POA: Diagnosis not present

## 2015-07-31 DIAGNOSIS — I10 Essential (primary) hypertension: Secondary | ICD-10-CM | POA: Diagnosis not present

## 2015-07-31 DIAGNOSIS — G4733 Obstructive sleep apnea (adult) (pediatric): Secondary | ICD-10-CM | POA: Diagnosis not present

## 2015-08-02 DIAGNOSIS — Z6841 Body Mass Index (BMI) 40.0 and over, adult: Secondary | ICD-10-CM | POA: Diagnosis not present

## 2015-08-29 DIAGNOSIS — M160 Bilateral primary osteoarthritis of hip: Secondary | ICD-10-CM | POA: Diagnosis not present

## 2015-08-29 DIAGNOSIS — E113293 Type 2 diabetes mellitus with mild nonproliferative diabetic retinopathy without macular edema, bilateral: Secondary | ICD-10-CM | POA: Diagnosis not present

## 2015-08-29 DIAGNOSIS — G4730 Sleep apnea, unspecified: Secondary | ICD-10-CM | POA: Diagnosis not present

## 2015-08-29 DIAGNOSIS — I10 Essential (primary) hypertension: Secondary | ICD-10-CM | POA: Diagnosis not present

## 2015-09-17 ENCOUNTER — Encounter: Payer: Self-pay | Admitting: *Deleted

## 2015-09-17 ENCOUNTER — Encounter: Payer: Self-pay | Admitting: Cardiology

## 2015-09-17 ENCOUNTER — Ambulatory Visit (INDEPENDENT_AMBULATORY_CARE_PROVIDER_SITE_OTHER): Payer: Medicare Other | Admitting: Cardiology

## 2015-09-17 VITALS — BP 130/72 | HR 52 | Ht 70.0 in | Wt 323.0 lb

## 2015-09-17 DIAGNOSIS — I10 Essential (primary) hypertension: Secondary | ICD-10-CM | POA: Diagnosis not present

## 2015-09-17 DIAGNOSIS — I739 Peripheral vascular disease, unspecified: Secondary | ICD-10-CM

## 2015-09-17 DIAGNOSIS — I255 Ischemic cardiomyopathy: Secondary | ICD-10-CM

## 2015-09-17 DIAGNOSIS — I2510 Atherosclerotic heart disease of native coronary artery without angina pectoris: Secondary | ICD-10-CM

## 2015-09-17 DIAGNOSIS — E785 Hyperlipidemia, unspecified: Secondary | ICD-10-CM

## 2015-09-17 NOTE — Progress Notes (Signed)
09/17/2015 Mathew Padilla   [birth year]  DP:[id number]  Primary Physician HAWKINS,EDWARD L, MD Primary Cardiologist: Dr. Angelena Form   Reason for Visit/CC: Surgical Clearance for Bariatric Surgery.   HPI: 65 yo WM with history of CAD, PAD, HTN, OSA who is here today for cardiac follow up. He has been followed in the past by Dr. Lattie Haw but now followed by Dr. Angelena Form. His coronary history includes MI in 1999 with stent placement ? Vessel following tPA and then balloon angioplasty of the PDA and second marginal in March of 2000. He did have a stress test in November 2011 which was normal. Admitted December 2012 to [facility] service with an ischemic right leg. He had undergone prior aortic Zenith aneurysm stent graft repair in 2007. Was seen urgently by Dr. Trula Slade in December 2012 and was found to have occluded right limb of stent graft as well as thrombosed popliteal aneurysm right leg. He underwent femoral-femoral bypass grafting and a femoral to below knee popliteal artery bypass graft with Gore-Tex. Readmitted March 2013 and underwent ligation of left popliteal aneurysm with left above-knee to below-knee popliteal bypass with reversed greater saphenous vein by Dr. Oneida Alar. Dr. Angelena Form saw him 02/13/12 and he had c/o pinching type chest pains for a few months, occurring several times per week as well as worsening dyspnea. I arranged a Lexiscan stress myoview on 02/18/12 which showed a moderate sized scar of moderate severity involving the mid-inferior, basal inferior, mid-inferolateral and basal inferolateral segments. There was minimal reversibility. LVEF=38%. Cardiac cath 03/11/12 with moderate stenosis mid LAD and mid Circumflex with severe stenosis small OM branch (too small for PCI) and totally occluded mid RCA. We continued medical management. Carotid artery dopplers November 2013 with mild bilateral disease. He was seen in our office 04/07/14 for surgical clearance before gastric sleeve surgery. Stress  myoview 04/13/14 was a high risk study with no clear ischemia, reduced LVEF. Echo 04/25/14 with LVEF=45-50%. He is intolerant to statins, beta blockers, Norvasc.   He presents to clinic today for pre-operative clearance for bariatric surgery, to be done at [facility]. He will require repeat lap band surgery. He had this once before in 2015 and tolerated the surgery well w/o cardiac complications. He denies any anginal symptomatolgy, including no exertional CP. EKG shows SR with PVCs but otherwise is unchanged from prior EKG.    Current Outpatient Prescriptions  Medication Sig Dispense Refill  . aspirin 325 MG tablet Take 325 mg by mouth daily.    . Ergocalciferol (VITAMIN D2 PO) Take 50,000 Units by mouth once a week.    . gabapentin (NEURONTIN) 400 MG capsule Take 400 mg by mouth 4 (four) times daily.    . hydrALAZINE (APRESOLINE) 50 MG tablet Take 1 tablet (50 mg total) by mouth 3 (three) times daily. 90 tablet 11  . ibuprofen (ADVIL,MOTRIN) 200 MG tablet Take 200 mg by mouth daily as needed for mild pain or moderate pain.     Marland Kitchen irbesartan (AVAPRO) 300 MG tablet Take 300 mg by mouth daily.    . metFORMIN (GLUCOPHAGE-XR) 500 MG 24 hr tablet Take 500 mg by mouth 2 (two) times daily.    . nitroGLYCERIN (NITROSTAT) 0.4 MG SL tablet Place 1 tablet (0.4 mg total) under the tongue every 5 (five) minutes as needed for chest pain. 25 tablet 11  . potassium chloride SA (KLOR-CON M20) 20 MEQ tablet TAKE 1 TABLET (20 MEQ TOTAL) BY MOUTH DAILY. 30 tablet 6  . pregabalin (LYRICA) 200 MG capsule  Take 200 mg by mouth. FOUR TIMES DAILY    . torsemide (DEMADEX) 20 MG tablet Take 20 mg by mouth daily.     No current facility-administered medications for this visit.    Allergies  Allergen Reactions  . Finasteride Other (See Comments)    Unable to void  . Aspirin     Other reaction(s): GI Upset (intolerance) Cannot take daily, just once in a while Daily dose upsets stomach  . Other Rash     Beta-blockers  . Statins Hives and Rash    Social History   Social History  . Marital Status: Married    Spouse Name: N/A  . Number of Children: 3  . Years of Education: N/A   Occupational History  . Retired, part Doctor, general practice    Social History Main Topics  . Smoking status: Former Smoker -- 2.00 packs/day for 30 years    Types: Cigarettes    Quit date: 11/26/1997  . Smokeless tobacco: Never Used     Comment: 40 pack years; quit 2000  . Alcohol Use: No  . Drug Use: No  . Sexual Activity: Yes   Other Topics Concern  . Not on file   Social History Narrative   Married, past employed, does not get regular exercise.      Review of Systems: General: negative for chills, fever, night sweats or weight changes.  Cardiovascular: negative for chest pain, dyspnea on exertion, edema, orthopnea, palpitations, paroxysmal nocturnal dyspnea or shortness of breath Dermatological: negative for rash Respiratory: negative for cough or wheezing Urologic: negative for hematuria Abdominal: negative for nausea, vomiting, diarrhea, bright red blood per rectum, melena, or hematemesis Neurologic: negative for visual changes, syncope, or dizziness All other systems reviewed and are otherwise negative except as noted above.    Blood pressure 130/72, pulse 52, height 5\' 10"  (1.778 m), weight 323 lb (146.512 kg).  General appearance: alert, cooperative and no distress, morbidly obese Neck: no carotid bruit and no JVD Lungs: clear to auscultation bilaterally Heart: regular rate and rhythm, S1, S2 normal, no murmur, click, rub or gallop Extremities: no LEE Pulses: 2+ and symmetric Skin: warm and dry Neurologic: Alert and oriented X 3, normal strength and tone. Normal symmetric reflexes. Normal coordination and gait  EKG NSR with PVCs. Unchanged from prior.   ASSESSMENT AND PLAN:   1. CAD: Stable. He is known to have moderate mid LAD and moderate mid Circumflex disease with chronic occlusion of  mid RCA by cath November 2013. Stress myoview December 2015 with no ischemia. Echo December 2015 with mild LV systolic dysfunction. He is intolerant to statins. He denies any exertional CP or dyspnea. EKG w/o ischemia and is unchanged from prior.   2. Carotid artery disease: Mild bilateral disease by carotid dopplers November 2013. Followed in [facility]  3. PAD including AAA stent repair in 2007: recent CT angio 06/2015 was stable per Dr. Oneida Alar. This is followed yearly.   4. Ischemic Cardiomyopathy/Chronic diastolic/systolic CHF: volume status is stable on current dose of torsemide, 20 mg daily. He is on an ARB. He does not tolerate beta blockers.   5. Hyperlipidemia: He is intolerant to statins.   6. HTN: Well controlled on current regimen at 130/72. He is now on ARB and hydralazine. He is intolerant of Norvasc, beta blockers.   7. Morbid Obesity: scheduled for redo lap band surgery. He had a nonischemic NST 03/2014 and denies any anginal symptomatolgy including no exertional CP. His EKG shows no ischemia and is  unchanged from prior EKG. Given lack of anginal symptoms and no EKG changes, there is no indication for further pre-operative w/u. Given his known cardiac history, he is at least moderate risk for complications but can be cleared for surgery.   PLAN  Keep f/u with Dr. Angelena Form in 12/2015.   Lyda Jester PA-C 09/17/2015 3:56 PM

## 2015-09-17 NOTE — Patient Instructions (Signed)
Medication Instructions:  Your physician recommends that you continue on your current medications as directed. Please refer to the Current Medication list given to you today.   Labwork:  none ordered  Testing/Procedures: None ordered  Follow-Up: Your physician recommends that you schedule a follow-up appointment in: SEE DR. MCALHANY AS PLANNED   Any Other Special Instructions Will Be Listed Below (If Applicable).  You are cleared for Bariatric Surgery.  We will type up a letter and fax it to them.   If you need a refill on your cardiac medications before your next appointment, please call your pharmacy.

## 2015-09-19 ENCOUNTER — Encounter: Payer: Self-pay | Admitting: *Deleted

## 2015-09-19 ENCOUNTER — Telehealth: Payer: Self-pay | Admitting: Cardiology

## 2015-09-19 NOTE — Telephone Encounter (Signed)
Returned a call from [employer], [place] @ [facility], left a message for her to call me back.

## 2015-09-19 NOTE — Telephone Encounter (Signed)
New Message:   Please call,concerning clearance she received from you.

## 2015-09-21 DIAGNOSIS — I25119 Atherosclerotic heart disease of native coronary artery with unspecified angina pectoris: Secondary | ICD-10-CM | POA: Diagnosis not present

## 2015-09-21 DIAGNOSIS — E113293 Type 2 diabetes mellitus with mild nonproliferative diabetic retinopathy without macular edema, bilateral: Secondary | ICD-10-CM | POA: Diagnosis not present

## 2015-09-21 DIAGNOSIS — M170 Bilateral primary osteoarthritis of knee: Secondary | ICD-10-CM | POA: Diagnosis not present

## 2015-09-21 DIAGNOSIS — G4730 Sleep apnea, unspecified: Secondary | ICD-10-CM | POA: Diagnosis not present

## 2015-09-21 DIAGNOSIS — Z01818 Encounter for other preprocedural examination: Secondary | ICD-10-CM | POA: Diagnosis not present

## 2015-09-21 NOTE — Telephone Encounter (Signed)
Dr. Angelena Form spoke with surgeon at [place]

## 2015-10-02 DIAGNOSIS — I10 Essential (primary) hypertension: Secondary | ICD-10-CM | POA: Diagnosis not present

## 2015-10-02 DIAGNOSIS — M160 Bilateral primary osteoarthritis of hip: Secondary | ICD-10-CM | POA: Diagnosis not present

## 2015-10-02 DIAGNOSIS — M170 Bilateral primary osteoarthritis of knee: Secondary | ICD-10-CM | POA: Diagnosis not present

## 2015-10-02 DIAGNOSIS — E559 Vitamin D deficiency, unspecified: Secondary | ICD-10-CM | POA: Diagnosis not present

## 2015-10-02 DIAGNOSIS — Z888 Allergy status to other drugs, medicaments and biological substances status: Secondary | ICD-10-CM | POA: Diagnosis not present

## 2015-10-02 DIAGNOSIS — Z6841 Body Mass Index (BMI) 40.0 and over, adult: Secondary | ICD-10-CM | POA: Diagnosis not present

## 2015-10-02 DIAGNOSIS — I2510 Atherosclerotic heart disease of native coronary artery without angina pectoris: Secondary | ICD-10-CM | POA: Diagnosis not present

## 2015-10-02 DIAGNOSIS — Z79899 Other long term (current) drug therapy: Secondary | ICD-10-CM | POA: Diagnosis not present

## 2015-10-02 DIAGNOSIS — E113213 Type 2 diabetes mellitus with mild nonproliferative diabetic retinopathy with macular edema, bilateral: Secondary | ICD-10-CM | POA: Diagnosis not present

## 2015-10-02 DIAGNOSIS — Z87891 Personal history of nicotine dependence: Secondary | ICD-10-CM | POA: Diagnosis not present

## 2015-10-02 DIAGNOSIS — E1140 Type 2 diabetes mellitus with diabetic neuropathy, unspecified: Secondary | ICD-10-CM | POA: Diagnosis not present

## 2015-10-02 DIAGNOSIS — E8881 Metabolic syndrome: Secondary | ICD-10-CM | POA: Diagnosis not present

## 2015-10-02 DIAGNOSIS — Z9884 Bariatric surgery status: Secondary | ICD-10-CM | POA: Diagnosis not present

## 2015-10-02 DIAGNOSIS — E119 Type 2 diabetes mellitus without complications: Secondary | ICD-10-CM | POA: Diagnosis not present

## 2015-10-02 DIAGNOSIS — G4733 Obstructive sleep apnea (adult) (pediatric): Secondary | ICD-10-CM | POA: Diagnosis not present

## 2015-10-02 DIAGNOSIS — Z7982 Long term (current) use of aspirin: Secondary | ICD-10-CM | POA: Diagnosis not present

## 2015-10-02 DIAGNOSIS — Z886 Allergy status to analgesic agent status: Secondary | ICD-10-CM | POA: Diagnosis not present

## 2015-10-02 DIAGNOSIS — Z9989 Dependence on other enabling machines and devices: Secondary | ICD-10-CM | POA: Diagnosis not present

## 2015-10-16 ENCOUNTER — Other Ambulatory Visit: Payer: Self-pay

## 2015-10-16 DIAGNOSIS — I10 Essential (primary) hypertension: Secondary | ICD-10-CM

## 2015-10-16 MED ORDER — HYDRALAZINE HCL 50 MG PO TABS: 50.0000 mg | ORAL_TABLET | Freq: Three times a day (TID) | ORAL | Status: DC

## 2015-10-16 NOTE — Telephone Encounter (Signed)
Consuelo Pandy, PA-C at 09/17/2015 3:27 PM  hydrALAZINE (APRESOLINE) 50 MG tabletTake 1 tablet (50 mg total) by mouth 3 (three) times daily 6. HTN: Well controlled on current regimen at 130/72. He is now on ARB and hydralazine. He is intolerant of Norvasc, beta blockers  Patient Instructions     Medication Instructions:  Your physician recommends that you continue on your current medications as directed. Please refer to the Current Medication list given to you today.

## 2015-11-12 DIAGNOSIS — Z7984 Long term (current) use of oral hypoglycemic drugs: Secondary | ICD-10-CM | POA: Diagnosis not present

## 2015-11-12 DIAGNOSIS — H5202 Hypermetropia, left eye: Secondary | ICD-10-CM | POA: Diagnosis not present

## 2015-11-12 DIAGNOSIS — E119 Type 2 diabetes mellitus without complications: Secondary | ICD-10-CM | POA: Diagnosis not present

## 2015-11-12 DIAGNOSIS — H5211 Myopia, right eye: Secondary | ICD-10-CM | POA: Diagnosis not present

## 2015-11-13 DIAGNOSIS — R339 Retention of urine, unspecified: Secondary | ICD-10-CM | POA: Diagnosis not present

## 2015-11-13 DIAGNOSIS — Z9079 Acquired absence of other genital organ(s): Secondary | ICD-10-CM | POA: Diagnosis not present

## 2015-12-13 DIAGNOSIS — K210 Gastro-esophageal reflux disease with esophagitis: Secondary | ICD-10-CM | POA: Diagnosis not present

## 2015-12-13 DIAGNOSIS — M545 Low back pain: Secondary | ICD-10-CM | POA: Diagnosis not present

## 2015-12-13 DIAGNOSIS — E119 Type 2 diabetes mellitus without complications: Secondary | ICD-10-CM | POA: Diagnosis not present

## 2015-12-14 ENCOUNTER — Other Ambulatory Visit (HOSPITAL_COMMUNITY): Payer: Self-pay | Admitting: Pulmonary Disease

## 2015-12-14 DIAGNOSIS — M79605 Pain in left leg: Secondary | ICD-10-CM

## 2015-12-14 DIAGNOSIS — G4733 Obstructive sleep apnea (adult) (pediatric): Secondary | ICD-10-CM | POA: Diagnosis not present

## 2015-12-14 DIAGNOSIS — E119 Type 2 diabetes mellitus without complications: Secondary | ICD-10-CM | POA: Diagnosis not present

## 2015-12-27 ENCOUNTER — Ambulatory Visit (HOSPITAL_COMMUNITY)
Admission: RE | Admit: 2015-12-27 | Discharge: 2015-12-27 | Disposition: A | Discharge status: Home / Self Care | Payer: Medicare Other | Source: Ambulatory Visit | Attending: Pulmonary Disease | Admitting: Pulmonary Disease

## 2015-12-27 DIAGNOSIS — M5136 Other intervertebral disc degeneration, lumbar region: Secondary | ICD-10-CM | POA: Insufficient documentation

## 2015-12-27 DIAGNOSIS — M4806 Spinal stenosis, lumbar region: Secondary | ICD-10-CM | POA: Insufficient documentation

## 2015-12-27 DIAGNOSIS — M79605 Pain in left leg: Secondary | ICD-10-CM | POA: Insufficient documentation

## 2016-01-10 DIAGNOSIS — M4727 Other spondylosis with radiculopathy, lumbosacral region: Secondary | ICD-10-CM | POA: Diagnosis not present

## 2016-01-15 ENCOUNTER — Other Ambulatory Visit: Payer: Self-pay | Admitting: Neurological Surgery

## 2016-01-15 DIAGNOSIS — M5412 Radiculopathy, cervical region: Secondary | ICD-10-CM

## 2016-01-15 DIAGNOSIS — M4727 Other spondylosis with radiculopathy, lumbosacral region: Secondary | ICD-10-CM

## 2016-01-16 ENCOUNTER — Ambulatory Visit (INDEPENDENT_AMBULATORY_CARE_PROVIDER_SITE_OTHER): Payer: Medicare Other

## 2016-01-16 DIAGNOSIS — M4186 Other forms of scoliosis, lumbar region: Secondary | ICD-10-CM | POA: Diagnosis not present

## 2016-01-16 DIAGNOSIS — M4727 Other spondylosis with radiculopathy, lumbosacral region: Secondary | ICD-10-CM

## 2016-01-16 DIAGNOSIS — M5126 Other intervertebral disc displacement, lumbar region: Secondary | ICD-10-CM | POA: Diagnosis not present

## 2016-01-21 ENCOUNTER — Ambulatory Visit: Payer: Medicare Other

## 2016-01-28 ENCOUNTER — Ambulatory Visit (INDEPENDENT_AMBULATORY_CARE_PROVIDER_SITE_OTHER): Payer: Medicare Other | Admitting: Rehabilitative and Restorative Service Providers"

## 2016-01-28 ENCOUNTER — Encounter: Payer: Self-pay | Admitting: Rehabilitative and Restorative Service Providers"

## 2016-01-28 DIAGNOSIS — M545 Low back pain: Secondary | ICD-10-CM

## 2016-01-28 DIAGNOSIS — R29898 Other symptoms and signs involving the musculoskeletal system: Secondary | ICD-10-CM | POA: Diagnosis not present

## 2016-01-28 DIAGNOSIS — G8929 Other chronic pain: Secondary | ICD-10-CM | POA: Diagnosis not present

## 2016-01-28 NOTE — Patient Instructions (Signed)
HIP: Hamstrings - Supine   Place strap around foot. Raise leg up, keeping knee straight.  Bend opposite knee to protect back if indicated. Hold 30 seconds. 3 reps per set, 2-3 sets per day     Outer Hip Stretch: Reclined IT Band Stretch (Strap)   Strap around one foot, pull leg across body until you feel a pull or stretch, with shoulders on mat. Hold for 30 seconds. Repeat 3 times each leg. 2-3 times/day.  Piriformis Stretch   Lying on back, pull right knee toward opposite shoulder. Hold 30 seconds. Repeat 3 times. Do 2-3 sessions per day.  Abdominal Bracing With Pelvic Floor (Hook-Lying)    With neutral spine, tighten pelvic floor and abdominals sucking belly button to back bone. Hold 10 sec Repeat _10__ times. Do _several__ times a day.    Posture Tips DO: - stand tall and erect - keep chin tucked in - keep head and shoulders in alignment - check posture regularly in mirror or large window - pull head back against headrest in car seat;  Change your position often.  Sit with lumbar support. DON'T: - slouch or slump while watching TV or reading - sit, stand or lie in one position  for too long;  Sitting is especially hard on the spine so if you sit at a desk/use the computer, then stand up often!   Posture - Standing   Good posture is important. Avoid slouching and forward head thrust. Maintain curve in low back and align ears over shoul- ders, hips over ankles.  Pull your belly button in toward your back bone.   Copyright  VHI. All rights reserved.  Posture - Sitting   Sit upright, head facing forward. Try using a roll to support lower back. Keep shoulders relaxed, and avoid rounded back. Keep hips level with knees. Avoid crossing legs for long periods.    TENS UNIT: This is helpful for muscle pain and spasm.   Search and Purchase a TENS 7000 2nd edition at www.tenspros.com. It should be less than $30.     TENS unit instructions: Do not shower or bathe with  the unit on Turn the unit off before removing electrodes or batteries If the electrodes lose stickiness add a drop of water to the electrodes after they are disconnected from the unit and place on plastic sheet. If you continued to have difficulty, call the TENS unit company to purchase more electrodes. Do not apply lotion on the skin area prior to use. Make sure the skin is clean and dry as this will help prolong the life of the electrodes. After use, always check skin for unusual red areas, rash or other skin difficulties. If there are any skin problems, does not apply electrodes to the same area. Never remove the electrodes from the unit by pulling the wires. Do not use the TENS unit or electrodes other than as directed. Do not change electrode placement without consultating your therapist or physician. Keep 2 fingers with between each electrode.

## 2016-01-28 NOTE — Therapy (Signed)
[place], [place], [address] Phone: [phone]   Fax:  [phone]  Physical Therapy Evaluation  Patient Details  Name: Mathew Padilla MRN: [id number] Date of Birth: 1950/08/25 Referring Provider: Dr Marland Kitchen Ditty  Encounter Date: 01/28/2016      PT End of Session - 01/28/16 1235    Visit Number 1   Number of Visits 12   Date for PT Re-Evaluation 03/10/16   PT Start Time [id number]   PT Stop Time 1233   PT Time Calculation (min) 57 min   Activity Tolerance Patient tolerated treatment well      Past Medical History:  Diagnosis Date  . AAA (abdominal aortic aneurysm) ([place]) 2007   stent graft repair 12/07  . Arteriosclerotic cardiovascular disease (ASCVD)    a. MI in 1999;  b.  PTCA of PDA and 2nd marginal in 3/00;  c. 02/2012 Cath: LM nl, LAD 52m, LCX 70m, OM small, 70, RCA 99p/171m-->Med Rx;  d. 03/2014 MV: EF 32%, inflat infarct w/ mild peri-infarct isch towards apex (similar to 2013 MV); d. 03/2014 Echo: EF 45-50%, Gr 1 DD, mildly dil LA.  Marland Kitchen CAD (coronary artery disease)   . Cerebrovascular disease   . CHF (congestive heart failure) ([place])   . Cholelithiasis 11/28/2010  . Cough, persistent   . Diabetes mellitus without complication ([place])   . Diverticulitis 2006  . DVT (deep venous thrombosis) ([place])   . Elevated PSA 06/2011   a. s/p TURP 01/2014.  Marland Kitchen Gastroesophageal reflux disease    Hiatal hernia; previously treated with PPI  . Hypertension    Mild; controlled with a single agent    . Laryngeal carcinoma ([place]) 2010   resection and radiation therapy 2010-11  . Myocardial infarction   . Obesity    BMI 47; lap band surgery 2010  . 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 Sleep apnea    BiPAP mask utilized        2 yrs sleep study    dr Redmond Pulling  . Tobacco abuse, in remission    40 pack years; discontinued in 2000    Past Surgical History:  Procedure Laterality Date  .  CARDIAC CATHETERIZATION  2000   PCI-stent  . CATARACT EXTRACTION    . COLONOSCOPY  10/2007  . COLONOSCOPY WITH ESOPHAGOGASTRODUODENOSCOPY (EGD)  2009   RMR: He had distal esophageal erosions, patulous GE junction, erosive reflux esophagitis, hiatal hernia, lipoma on the angularis not manipulated, left-sided diverticula.  Marland Kitchen EYE SURGERY    . FEMORAL-FEMORAL BYPASS GRAFT  04/18/2011   Procedure: BYPASS GRAFT FEMORAL-FEMORAL ARTERY;  Surgeon: Theotis Burrow, MD;  Location: [facility] OR;  Service: Vascular;  Laterality: Bilateral;  Left to right femoral to femoral bypass   . FEMORAL-POPLITEAL BYPASS GRAFT  04/18/2011   Procedure: BYPASS GRAFT FEMORAL-POPLITEAL ARTERY;  Surgeon: Theotis Burrow, MD;  Location: [facility] OR;  Service: Vascular;  Laterality: Right;  Right femoral popliteal bypass with propaten gortex graft  . LAPAROSCOPIC GASTRIC BANDING  2010  . LARYNX SURGERY  2010   Resection of carcinoma  . LEFT HEART CATHETERIZATION WITH CORONARY ANGIOGRAM N/A 03/10/2012   Procedure: LEFT HEART CATHETERIZATION WITH CORONARY ANGIOGRAM;  Surgeon: Burnell Blanks, MD;  Location: [facility] CATH LAB;  Service: Cardiovascular;  Laterality: N/A;  . OPEN ANTERIOR SHOULDER RECONSTRUCTION  2011   Left  . PERIPHERAL ARTERIAL STENT GRAFT  2007   for abdominal aortic aneurysm  . PR VEIN  BYPASS GRAFT,AORTO-FEM-POP  07-23-11   Left AK to BK popliteal BPG using rev. saphenous vein    There were no vitals filed for this visit.       Subjective Assessment - 01/28/16 1137    Subjective Patient reports that he has a history of LBP for the past 20 years with symptoms worsening in the past 6 months. He was wrestling with his 66 year old grandson when his grandson jumped onto his crossed knee. He has had constant pain since the time of the injury. He has difficulty sitting; walking; and sleeping.    Pertinent History LBP x 20 years; Lt shoulder reconstructive surgery ~ 6 years ago; AODM; aortic anurysm - stent placement;  bilat DVT's LE's ~ 8 years ago; gastric sleeve ~ 3 months ago has lost ~60 pounds    How long can you sit comfortably? 1-2 hours   How long can you stand comfortably? 10-20 min    How long can you walk comfortably? 30 min    Diagnostic tests xrays; MRI; CT scan    Patient Stated Goals get rid of back pain    Currently in Pain? Yes   Pain Score 5    Pain Location Back   Pain Orientation Left;Lower   Pain Descriptors / Indicators Sore   Pain Type Chronic pain   Pain Radiating Towards nothing in the thigh - has pain in the front of shin and arch of foot - sometimes in the top of the foot. Foot pain is always present. Has some pain in the Rt foot as well, more intense in the Lt    Pain Onset More than a month ago   Pain Frequency Constant   Aggravating Factors  moving "wrong" especially bending forward or backward; stretching out on the bed trying to sleep - back will be sore and the arch of the Lt foot will be sore; lying on the Lt side is extrememly painful    Pain Relieving Factors icy hot patch - some help            [facility] PT Assessment - 01/28/16 0001      Assessment   Medical Diagnosis Lumbar radiculopathy    Referring Provider Dr Marland Kitchen Ditty   Onset Date/Surgical Date 07/28/15   Hand Dominance Left   Next MD Visit 01/30/16   Prior Therapy none     Precautions   Precautions None     Balance Screen   Has the patient fallen in the past 6 months No   Has the patient had a decrease in activity level because of a fear of falling?  No   Is the patient reluctant to leave their home because of a fear of falling?  No     Home Environment   Living Environment Private residence   Additional Comments stairs in and out of home - pt walks up and down the stairs sideways b/c of pain      Prior Function   Level of Independence Independent   Vocation Retired   Geneticist, molecular ~ 10 years    Leisure [employer] 3 times/wk bicycle/ swimming/rowing machine/machines       Observation/Other Assessments   Focus on Therapeutic Outcomes (FOTO)  59% limitation      Sensation   Additional Comments WFL's per pt report      Posture/Postural Control   Posture Comments head forward; increased thoracic kyphosis; weight shifted to the Rt in standing; Lt LE in ER  AROM   Overall AROM Comments tightness through bilat hips in all planes    Lumbar Flexion 65%   Lumbar Extension 5%   Lumbar - Right Side Bend 60%   Lumbar - Left Side Bend 60%   Lumbar - Right Rotation 45%   Lumbar - Left Rotation 45%     Strength   Overall Strength Comments strength grossly WFL's functionally patient unable to lie prone for muscle testing; painful in supine      Flexibility   Hamstrings Rt 50 deg 45 deg    ITB tight bilat    Piriformis tight bilat      Palpation   Palpation comment tight and tender to palpation through the L3/4/5 area centrally and to the Lt assessed in sidelying                    [facility] Adult PT Treatment/Exercise - 01/28/16 0001      Self-Care   Self-Care --  initiated spine care education      Lumbar Exercises: Stretches   Passive Hamstring Stretch 3 reps;30 seconds   ITB Stretch 3 reps;30 seconds   Piriformis Stretch 3 reps;30 seconds     Lumbar Exercises: Supine   Ab Set 10 reps;5 seconds  3 part core - poor execution even with verbal/tactile cues     Moist Heat Therapy   Number Minutes Moist Heat 20 Minutes   Moist Heat Location Lumbar Spine     Electrical Stimulation   Electrical Stimulation Location bilat lumbar spine; Lt LB x 2   Electrical Stimulation Action IFC   Electrical Stimulation Parameters to tolerance   Electrical Stimulation Goals Pain;Tone                PT Education - 01/28/16 1230    Education provided Yes   Education Details HEP    Person(s) Educated Patient   Methods Explanation;Demonstration;Tactile cues;Verbal cues;Handout   Comprehension Verbalized understanding;Returned  demonstration;Verbal cues required;Tactile cues required             PT Long Term Goals - 01/28/16 1241      PT LONG TERM GOAL #1   Title Improve trunk and LE moblity and ROM 03/10/16   Time 6   Period Weeks   Status New     PT LONG TERM GOAL #2   Title Decrease pain allowing Mathew Padilla to sleep for 3-4 hours without awakending due to pain 03/10/16   Time 6   Period Weeks   Status New     PT LONG TERM GOAL #3   Title Decrease frequency; intenisty; duration of pain allwoing Mathew Padilla to increase functional activity level at home and at the [facility] 03/10/16   Time 6   Period Weeks   Status New     PT LONG TERM GOAL #4   Title Independent in HEP 03/10/16   Time 6   Period Weeks   Status New     PT LONG TERM GOAL #5   Title Improve FOTO to </= 42% limitation 03/10/16   Time 6   Period Weeks   Status New               Plan - 01/28/16 1236    Clinical Impression Statement Mathew Padilla presents with a 6 month history of LBP following an injury while wrestling with his 24 year old grandson in which he sustained a forceful blow through Lt LE with hip abducted and externally rotated. Mathew Padilla has had continued LBP  with intermittent Lt LE pain in the anterior shin and into the Lt arch. He has poor posture and alignment; limited trunk and LE moblity/ROM; decreased functional abilities and inability to sleep. He has pain on a constant basis.    Rehab Potential Good   PT Frequency 2x / week   PT Duration 6 weeks   PT Treatment/Interventions Patient/family education;ADLs/Self Care Home Management;Cryotherapy;Electrical Stimulation;Iontophoresis 4mg /ml Dexamethasone;Moist Heat;Traction;Ultrasound;Therapeutic activities;Therapeutic exercise;Dry needling;Manual techniques   PT Next Visit Plan progress with stretching; core stabilization; trial of mechanical traction; modalities for pain management   Consulted and Agree with Plan of Care Patient      Patient will benefit from skilled therapeutic  intervention in order to improve the following deficits and impairments:  Postural dysfunction, Improper body mechanics, Pain, Decreased range of motion, Decreased mobility, Decreased activity tolerance  Visit Diagnosis: Chronic bilateral low back pain, with sciatica presence unspecified - Plan: PT plan of care cert/re-cert  Other symptoms and signs involving the musculoskeletal system - Plan: PT plan of care cert/re-cert     Problem List Patient Active Problem List   Diagnosis Date Noted  . Pulmonary nodules/lesions, multiple 11/05/2013  . Prostate enlargement 11/05/2013  . Infection of urinary tract 11/05/2013  . Pyelonephritis 11/03/2013  . Sepsis-unclear source 11/03/2013  . History of laparoscopic adjustable gastric banding, APL, 12/19/2008. 05/12/2012  . Cold feet 04/01/2012  . Chest pain 04/01/2012  . Atherosclerosis of native arteries of the extremities with intermittent claudication 11/06/2011  . 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 S/P bypass graft of extremity 08/07/2011  . Abdominal aneurysm without mention of rupture 07/17/2011  . Peripheral vascular disease, unspecified 05/12/2011  . Pulmonary infiltrate 12/02/2010  . Cholelithiasis 11/28/2010  . Low back pain 11/25/2010  . Pneumonia 11/25/2010  . Arteriosclerotic cardiovascular disease (ASCVD)   . Laryngeal carcinoma ([place])   . AAA (abdominal aortic aneurysm) ([place])   . Diverticulitis   . Obesity   . Gastroesophageal reflux disease   . Sleep apnea   . Tobacco abuse, in remission   . Cerebrovascular disease   . Essential hypertension 11/21/2009    Thoams Siefert Nilda Simmer PT, MPH  01/28/2016, 1:01 PM  [facility] [place], [place], [address] Phone: [phone]   Fax:  [phone]  Name: Mathew Padilla MRN: [id number] Date of Birth: [birth year]

## 2016-02-07 ENCOUNTER — Encounter (INDEPENDENT_AMBULATORY_CARE_PROVIDER_SITE_OTHER): Payer: Self-pay

## 2016-02-07 ENCOUNTER — Encounter: Payer: Self-pay | Admitting: Cardiovascular Disease

## 2016-02-07 ENCOUNTER — Other Ambulatory Visit: Payer: Self-pay | Admitting: Neurological Surgery

## 2016-02-07 ENCOUNTER — Ambulatory Visit (INDEPENDENT_AMBULATORY_CARE_PROVIDER_SITE_OTHER): Payer: Medicare Other | Admitting: Cardiovascular Disease

## 2016-02-07 VITALS — BP 140/82 | HR 85 | Ht 70.0 in | Wt 289.2 lb

## 2016-02-07 DIAGNOSIS — E7800 Pure hypercholesterolemia, unspecified: Secondary | ICD-10-CM | POA: Diagnosis not present

## 2016-02-07 DIAGNOSIS — I255 Ischemic cardiomyopathy: Secondary | ICD-10-CM

## 2016-02-07 DIAGNOSIS — I739 Peripheral vascular disease, unspecified: Secondary | ICD-10-CM

## 2016-02-07 DIAGNOSIS — Z01810 Encounter for preprocedural cardiovascular examination: Secondary | ICD-10-CM

## 2016-02-07 DIAGNOSIS — I10 Essential (primary) hypertension: Secondary | ICD-10-CM | POA: Diagnosis not present

## 2016-02-07 DIAGNOSIS — I2510 Atherosclerotic heart disease of native coronary artery without angina pectoris: Secondary | ICD-10-CM

## 2016-02-07 MED ORDER — ASPIRIN EC 81 MG PO TBEC: 81.0000 mg | DELAYED_RELEASE_TABLET | Freq: Every day | ORAL | 3 refills | Status: DC

## 2016-02-07 MED ORDER — IRBESARTAN 150 MG PO TABS: 150.0000 mg | ORAL_TABLET | Freq: Every day | ORAL | 11 refills | Status: DC

## 2016-02-07 NOTE — Progress Notes (Signed)
Chief Complaint  Patient presents with  . Follow-up     History of Present Illness: 65 yo WM with history of CAD, PAD, HTN, OSA who is here today for cardiac follow up. His coronary history includes MI in 1999 treated with tPA and stent RCA and then balloon angioplasty of the PDA and second marginal in March of 2000. Admitted to [facility] December 2012 to VVS service with an ischemic right leg. He had undergone prior aortic Zenith aneurysm stent graft repair in 2007 and in 2012 was found to have occluded right limb of stent graft as well as thrombosed popliteal aneurysm right leg. He underwent femoral-femoral bypass grafting and a femoral to below knee popliteal artery bypass graft with Gore-Tex. Readmitted March 2013 and underwent ligation of left popliteal aneurysm with left above-knee to below-knee popliteal bypass with reversed greater saphenous vein by Dr. Oneida Alar. I saw him 02/13/12 and he had c/o chest pain and dyspnea. Lexiscan stress myoview on 02/18/12 showed a moderate sized scar of moderate severity involving the mid-inferior, basal inferior, mid-inferolateral and basal inferolateral segments. There was minimal reversibility. LVEF=38%. Cardiac cath 03/11/12 with moderate stenosis mid LAD and mid Circumflex with severe stenosis small OM branch (too small for PCI) and totally occluded mid RCA. We continued medical management. Carotid artery dopplers November 2013 with mild bilateral disease. He was seen in our office 04/07/14 for surgical clearance before gastric sleeve surgery. Stress myoview 04/13/14 was a high risk study with no clear ischemia, reduced LVEF. Echo 04/25/14 with LVEF=45-50%. He is intolerant to statins, beta blockers, Norvasc.   He is here today for follow up. He has no chest pain or dyspnea. He has chronic LE edema which is unchanged. He is feeling great. He works out at [employer] 4 times per week. He reports the best stamina he has had in years.    Primary Care Physician:   Alonza Bogus, MD  Past Medical History:  Diagnosis Date  . AAA (abdominal aortic aneurysm) ([place]) 2007   stent graft repair 12/07  . Arteriosclerotic cardiovascular disease (ASCVD)    a. MI in 1999;  b.  PTCA of PDA and 2nd marginal in 3/00;  c. 02/2012 Cath: LM nl, LAD 20m, LCX 47m, OM small, 70, RCA 99p/172m-->Med Rx;  d. 03/2014 MV: EF 32%, inflat infarct w/ mild peri-infarct isch towards apex (similar to 2013 MV); d. 03/2014 Echo: EF 45-50%, Gr 1 DD, mildly dil LA.  Marland Kitchen CAD (coronary artery disease)   . Cerebrovascular disease   . CHF (congestive heart failure) ([place])   . Cholelithiasis 11/28/2010  . Cough, persistent   . Diabetes mellitus without complication ([place])   . Diverticulitis 2006  . DVT (deep venous thrombosis) ([place])   . Elevated PSA 06/2011   a. s/p TURP 01/2014.  Marland Kitchen Gastroesophageal reflux disease    Hiatal hernia; previously treated with PPI  . Hypertension    Mild; controlled with a single agent    . Laryngeal carcinoma ([place]) 2010   resection and radiation therapy 2010-11  . Myocardial infarction   . Obesity    BMI 47; lap band surgery 2010  . 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 Sleep apnea    BiPAP mask utilized        2 yrs sleep study    dr Redmond Pulling  . Tobacco abuse, in remission    40 pack years; discontinued in 2000    Past Surgical History:  Procedure Laterality Date  .  CARDIAC CATHETERIZATION  2000   PCI-stent  . CATARACT EXTRACTION    . COLONOSCOPY  10/2007  . COLONOSCOPY WITH ESOPHAGOGASTRODUODENOSCOPY (EGD)  2009   RMR: He had distal esophageal erosions, patulous GE junction, erosive reflux esophagitis, hiatal hernia, lipoma on the angularis not manipulated, left-sided diverticula.  Marland Kitchen EYE SURGERY    . FEMORAL-FEMORAL BYPASS GRAFT  04/18/2011   Procedure: BYPASS GRAFT FEMORAL-FEMORAL ARTERY;  Surgeon: Theotis Burrow, MD;  Location: [facility] OR;  Service: Vascular;  Laterality: Bilateral;  Left to right femoral to femoral bypass   .  FEMORAL-POPLITEAL BYPASS GRAFT  04/18/2011   Procedure: BYPASS GRAFT FEMORAL-POPLITEAL ARTERY;  Surgeon: Theotis Burrow, MD;  Location: [facility] OR;  Service: Vascular;  Laterality: Right;  Right femoral popliteal bypass with propaten gortex graft  . LAPAROSCOPIC GASTRIC BANDING  2010  . LARYNX SURGERY  2010   Resection of carcinoma  . LEFT HEART CATHETERIZATION WITH CORONARY ANGIOGRAM N/A 03/10/2012   Procedure: LEFT HEART CATHETERIZATION WITH CORONARY ANGIOGRAM;  Surgeon: Burnell Blanks, MD;  Location: [facility] CATH LAB;  Service: Cardiovascular;  Laterality: N/A;  . OPEN ANTERIOR SHOULDER RECONSTRUCTION  2011   Left  . PERIPHERAL ARTERIAL STENT GRAFT  2007   for abdominal aortic aneurysm  . PR VEIN BYPASS GRAFT,AORTO-FEM-POP  07-23-11   Left AK to BK popliteal BPG using rev. saphenous vein    Current Outpatient Prescriptions  Medication Sig Dispense Refill  . Ergocalciferol (VITAMIN D2 PO) Take 50,000 Units by mouth once a week.    . nitroGLYCERIN (NITROSTAT) 0.4 MG SL tablet Place 1 tablet (0.4 mg total) under the tongue every 5 (five) minutes as needed for chest pain. 25 tablet 11  . potassium chloride SA (KLOR-CON M20) 20 MEQ tablet TAKE 1 TABLET (20 MEQ TOTAL) BY MOUTH DAILY. 30 tablet 6  . torsemide (DEMADEX) 20 MG tablet Take 20 mg by mouth daily.    Marland Kitchen aspirin EC 81 MG tablet Take 1 tablet (81 mg total) by mouth daily. 90 tablet 3  . irbesartan (AVAPRO) 150 MG tablet Take 1 tablet (150 mg total) by mouth daily. 30 tablet 11   No current facility-administered medications for this visit.     Allergies  Allergen Reactions  . Finasteride Other (See Comments)    Unable to void Unable to void Unable to void  . Other Rash    Beta-blockers Beta-blockers Beta-blockers  . Statins Hives and Rash  . Aspirin Other (See Comments)    Cannot take daily, just once in a while Cannot take daily, just once in a while Other reaction(s): GI Upset (intolerance) Cannot take daily, just once  in a while Daily dose upsets stomach    Social History   Social History  . Marital status: Married    Spouse name: N/A  . Number of children: 3  . Years of education: N/A   Occupational History  . Retired, part Doctor, general practice    Social History Main Topics  . Smoking status: Former Smoker    Packs/day: 2.00    Years: 30.00    Types: Cigarettes    Quit date: 11/26/1997  . Smokeless tobacco: Never Used     Comment: 40 pack years; quit 2000  . Alcohol use No  . Drug use: No  . Sexual activity: Yes   Other Topics Concern  . Not on file   Social History Narrative   Married, past employed, does not get regular exercise.     Family History  Problem Relation  Age of Onset  . Heart attack Father     deceased  . Heart failure Father   . Hyperlipidemia Father   . Hypertension Father   . Heart disease Father   . Lung cancer Mother     deceased   . Hypertension Mother   . Hyperlipidemia Mother   . Heart failure Mother   . Diabetes Mother   . Cancer Sister     vaginal  . Diabetes Sister   . Colon cancer Neg Hx     Review of Systems:  As stated in the HPI and otherwise negative.   BP 140/82   Pulse 85   Ht 5\' 10"  (1.778 m)   Wt 289 lb 3.2 oz (131.2 kg)   BMI 41.50 kg/m   Physical Examination: General: Well developed, well nourished, NAD  HEENT: OP clear, mucus membranes moist  SKIN: warm, dry. No rashes. Neuro: No focal deficits  Musculoskeletal: Muscle strength 5/5 all ext  Psychiatric: Mood and affect normal  Neck: No JVD, no carotid bruits, no thyromegaly, no lymphadenopathy.  Lungs:Clear bilaterally, no wheezes, rhonci, crackles Cardiovascular: Regular rate and rhythm. No murmurs, gallops or rubs. Abdomen:Soft. Bowel sounds present. Non-tender.  Extremities: No lower extremity edema. Pulses are 2 + in the bilateral DP/PT.  Cardiac cath 03/11/12: Left main: No obstructive disease.  Left Anterior Descending Artery: Large caliber vessel that courses to the  apex. The mid vessel has a 50% stenosis which was visualized in multiple views and does not appear to be flow limiting. The first diagonal branch arises from this area of stenosis. This small to moderate sized branch has mild plaque disease. The mid and distal LAD has mild plaque disease.  Circumflex Artery: Large caliber vessel with moderate sized bifurcating first obtuse marginal branch with mild plaque disease. 50% stenosis mid AV groove Circumflex, does not appear flow limiting. The most distal obtuse marginal is small in caliber and has a 70% stenosis. This vessel is too small for PCI.  Right Coronary Artery: This appears to be at least a moderate sized vessel. The proximal vessel has 99% sub-total occlusion followed by a 100% occlusion in the mid vessel.  Left Ventricular Angiogram: Deferred.  Impression:  1. Triple vessel CAD with moderate disease in the LAD and Circumflex, chronic total occlusion of the RCA  2. Inferior wall scar on myoview c/w the chronic total occlusion of the RCA  Stress myoview 04/13/14: Stress Procedure: The patient received IV Lexiscan 0.4 mg over 15-seconds. Technetium 58m Sestamibi injected at 30-seconds. The patient had baseline Chest Tightness 1/10, but no change with Lexiscan.Quantitative spect images were obtained after a 45 minute delay. Stress ECG: No significant ST segment change suggestive of ischemia. QPS Raw Data Images: Acquisition technically good; LVE. Stress Images: There is decreased uptake in the inferior lateral wall and apex. Rest Images: There is decreased uptake in the inferior lateral wall. Subtraction (SDS): These findings are consistent with prior infarct and mild peri-infarct ischemia. Transient Ischemic Dilatation (Normal <1.22): 1.08 Lung/Heart Ratio (Normal <0.45): 0.42 Quantitative Gated Spect Images QGS EDV: 230 ml QGS ESV: 156 ml Impression Exercise Capacity: Lexiscan with no exercise. BP Response: Normal blood  pressure response. Clinical Symptoms: No chest pain or dyspnea. ECG Impression: No significant ST segment change suggestive of ischemia. Comparison with Prior Nuclear Study: Compared to 02/26/12, no significant change. Overall Impression: High risk stress nuclear study with a large, severe, partially reversible inferior lateral defect consistent with prior infarct and mild peri-infarct ischemia towards  the apex. Study high risk due to reduced LV function. LV Ejection Fraction: 32%. LV Wall Motion: Global hypokinesis with akinesis of the inferior lateral wall.  Echo 04/25/14: Left ventricle: The cavity size was mildly dilated. Wall thickness was normal. Systolic function was mildly reduced. The estimated ejection fraction was in the range of 45% to 50%. Doppler parameters are consistent with abnormal left ventricular relaxation (grade 1 diastolic dysfunction). - Left atrium: The atrium was mildly dilated.  EKG:  EKG is ordered today. The ekg ordered today demonstrates Sinus, rate 85 bpm. 1st degree AV block. PVCs. Old inferior Q waves. IVCD  Recent Labs: 06/28/2015: BUN 13; Creat 0.75   Lipid Panel Followed in primary care   Wt Readings from Last 3 Encounters:  02/07/16 289 lb 3.2 oz (131.2 kg)  09/17/15 (!) 323 lb (146.5 kg)  07/18/15 (!) 333 lb (151 kg)     Other studies Reviewed: Additional studies/ records that were reviewed today include: . Review of the above records demonstrates:    Assessment and Plan:   1. CAD: No recent chest pain suggestive of ischemia. He is known to have moderate mid LAD and moderate mid Circumflex disease with chronic occlusion of mid RCA by cath November 2013. Stress myoview December 2015 with no ischemia. Echo December 2015 with mild LV systolic dysfunction.  He is intolerant to statins. Will restart ASA 81 mg daily. It has been held post gastric sleeve surgery.   2. Carotid artery disease: Mild bilateral disease by carotid dopplers  November 2013.  Followed in VVS  3. PAD: Followed in VVS  4. Ischemic Cardiomyopathy/Chronic diastolic/systolic CHF: He has stopped taking his Avapro. Will restart at 150 mg daily. He does not tolerate beta blockers. Will continue torsemide 20 mg daily. Volume status is ok.   5. Hyperlipidemia: He is intolerant to statins.   6. HTN: BP is controlled off of all therapy. He is intolerant of Norvasc, beta blockers.   7. Pre-operative cardiovascular examination: He has no chest pain or dyspnea suggestive of angina. He is exercising 4 days per week with no trouble. His baseline nuclear stress test shows scar. I do not think repeat stress testing is indicated. He can proceed with his planned surgical procedure without further cardiac workup.   Current medicines are reviewed at length with the patient today.  The patient does not have concerns regarding medicines.  The following changes have been made:  no change  Labs/ tests ordered today include:   Orders Placed This Encounter  Procedures  . EKG 12-Lead    Disposition:   FU with me in 12  months  Signed, Lauree Chandler, MD 02/07/2016 11:31 AM    [facility] [address], [place], [place]  [address] Phone: [phone]; Fax: [phone]

## 2016-02-07 NOTE — Patient Instructions (Signed)
Medication Instructions:  Your physician has recommended you make the following change in your medication:  Start Avapro 150 mg by mouth daily Start Aspirin 81 mg by mouth daily.    Labwork: none  Testing/Procedures: none  Follow-Up: Your physician wants you to follow-up in: 12 months.  You will receive a reminder letter in the mail two months in advance. If you don't receive a letter, please call our office to schedule the follow-up appointment.   Any Other Special Instructions Will Be Listed Below (If Applicable).     If you need a refill on your cardiac medications before your next appointment, please call your pharmacy.

## 2016-02-11 ENCOUNTER — Telehealth: Payer: Self-pay | Admitting: Cardiovascular Disease

## 2016-02-11 ENCOUNTER — Encounter: Payer: Medicare Other | Admitting: Rehabilitative and Restorative Service Providers"

## 2016-02-11 NOTE — Telephone Encounter (Signed)
Spoke with pt. He took first dose of Avapro 150 mg on 10/13. BP was 115/70 later that evening   Pt felt this was too low so he decreased dose to 75 mg on 10/14. He did not check BP that day. On 10/15 he took Avapro in the evening and BP today was 146/46. He felt dizzy, sweaty and had a headache today. He ate something salty and started feeling better.  Feels tired after starting Avapro.  He rechecked BP while on the phone with me and it is now 140/50.  Pt is asking if he should continue Avapro.

## 2016-02-11 NOTE — Telephone Encounter (Signed)
New message      Pt c/o BP issue: STAT if pt c/o blurred vision, one-sided weakness or slurred speech  1. What are your last 5 BP readings? 146/46 2. Are you having any other symptoms (ex. Dizziness, headache, blurred vision, passed out)? tired 3. What is your BP issue?  Since starting avapro 150mg , pt states his bp is really low.  He only takes 1/2 pill not 1 pill.  Please call

## 2016-02-12 NOTE — Telephone Encounter (Signed)
I spoke with pt and gave him information from Dr. McAlhany 

## 2016-02-12 NOTE — Telephone Encounter (Signed)
I would have him stop the Avapro for one week and follow BP over that week then let us know the readings. Thanks, chris

## 2016-02-12 NOTE — Telephone Encounter (Signed)
So he is hypertensive while taking it? I am not sure what he is asking. Is he thinking the medication is making him dizzy?

## 2016-02-12 NOTE — Telephone Encounter (Signed)
He felt the BP of 115/70 was too low so he decreased Avapro.  He is concerned diastolic is low and Avapro is contributing to feeling tired and may have caused him to have dizziness and feeling sweaty.

## 2016-02-18 ENCOUNTER — Encounter (HOSPITAL_COMMUNITY): Payer: Self-pay

## 2016-02-18 ENCOUNTER — Encounter (HOSPITAL_COMMUNITY)
Admission: RE | Admit: 2016-02-18 | Discharge: 2016-02-18 | Disposition: A | Discharge status: Home / Self Care | Payer: Medicare Other | Source: Ambulatory Visit | Attending: Neurological Surgery | Admitting: Neurological Surgery

## 2016-02-18 DIAGNOSIS — Z01812 Encounter for preprocedural laboratory examination: Secondary | ICD-10-CM | POA: Insufficient documentation

## 2016-02-18 DIAGNOSIS — M4727 Other spondylosis with radiculopathy, lumbosacral region: Secondary | ICD-10-CM | POA: Diagnosis not present

## 2016-02-18 LAB — CBC
HCT: 46.1 % (ref 39.0–52.0)
Hemoglobin: 15.6 g/dL (ref 13.0–17.0)
MCH: 30.6 pg (ref 26.0–34.0)
MCHC: 33.8 g/dL (ref 30.0–36.0)
MCV: 90.6 fL (ref 78.0–100.0)
PLATELETS: 142 10*3/uL — AB (ref 150–400)
RBC: 5.09 MIL/uL (ref 4.22–5.81)
RDW: 13.3 % (ref 11.5–15.5)
WBC: 7 10*3/uL (ref 4.0–10.5)

## 2016-02-18 LAB — BASIC METABOLIC PANEL
Anion gap: 9 (ref 5–15)
BUN: 16 mg/dL (ref 6–20)
CALCIUM: 9.1 mg/dL (ref 8.9–10.3)
CHLORIDE: 102 mmol/L (ref 101–111)
CO2: 27 mmol/L (ref 22–32)
CREATININE: 0.84 mg/dL (ref 0.61–1.24)
GFR calc Af Amer: >60 mL/min (ref >60)
GFR calc non Af Amer: >60 mL/min (ref >60)
Glucose, Bld: 136 mg/dL — ABNORMAL HIGH (ref 65–99)
Potassium: 4 mmol/L (ref 3.5–5.1)
SODIUM: 138 mmol/L (ref 135–145)

## 2016-02-18 LAB — TYPE AND SCREEN
ABO/RH(D): O NEG
Antibody Screen: NEGATIVE

## 2016-02-18 LAB — GLUCOSE, CAPILLARY: Glucose-Capillary: 129 mg/dL — ABNORMAL HIGH (ref 65–99)

## 2016-02-18 LAB — SURGICAL PCR SCREEN
MRSA, PCR: NEGATIVE
Staphylococcus aureus: NEGATIVE

## 2016-02-18 LAB — APTT: aPTT: 31 seconds (ref 24–36)

## 2016-02-18 LAB — PROTIME-INR
INR: 1.09
Prothrombin Time: 14.1 seconds (ref 11.4–15.2)

## 2016-02-18 NOTE — Pre-Procedure Instructions (Addendum)
Mathew Padilla  02/18/2016     Your procedure is scheduled on Friday, October 27.  Report to [facility] Admitting at 8:20 AM                Your surgery or procedure is scheduled for 10:00 AM   Call this number if you have problems the morning of surgery:[phone]                 For any other questions, please call [phone], Monday - Friday 8 AM - 4 PM.   Remember:  Do not eat food or drink liquids after midnight Thursday, October 26.              Take these medicines the morning of surgery with A SIP OF WATER: Take if needed : Esomeprazole Magnesium (NEXIUM), hydrALAZINE (APRESOLINE), nitroGLYCERIN (NITROSTAT), pregabalin (LYRICA).  No metformin am of surgery  STOP all herbel meds, nsaids (aleve,naproxen,advil,ibuprofen)  Starting today including vitamins diclofenac,aspirin   Do not wear jewelry, make-up or nail polish.  Do not wear lotions, powders, or perfumes, or deodorant.  Men may shave face and neck.  Do not bring valuables to the hospital.  [facility] is not responsible for any belongings or valuables.  Contacts, dentures or bridgework may not be worn into surgery.  Leave your suitcase in the car.  After surgery it may be brought to your room.  For patients admitted to the hospital, discharge time will be determined by your treatment team.  Patients discharged the day of surgery will not be allowed to drive home.    Special Instructions: [place] - Preparing for Surgery  Before surgery, you can play an important role.  Because skin is not sterile, your skin needs to be as free of germs as possible.  You can reduce the number of germs on you skin by washing with CHG (chlorahexidine gluconate) soap before surgery.  CHG is an antiseptic cleaner which kills germs and bonds with the skin to continue killing germs even after washing.  Please DO NOT use if you have an allergy to CHG or antibacterial soaps.  If your skin becomes reddened/irritated stop  using the CHG and inform your nurse when you arrive at Short Stay.  Do not shave (including legs and underarms) for at least 48 hours prior to the first CHG shower.  You may shave your face.  Please follow these instructions carefully:   1.  Shower with CHG Soap the night before surgery and the morning of Surgery.  2.  If you choose to wash your hair, wash your hair first as usual with your normal shampoo.  3.  After you shampoo, rinse your hair and body thoroughly to remove the Shampoo.  4.  Use CHG as you would any other liquid soap.  You can apply chg directly  to the skin and wash gently with scrungie or a clean washcloth.  5.  Apply the CHG Soap to your body ONLY FROM THE NECK DOWN.  Do not use on open wounds or open sores.  Avoid contact with your eyes ears, mouth and genitals (private parts).  Wash genitals (private parts)       with your normal soap.  6.  Wash thoroughly, paying special attention to the area where your surgery will be performed.  7.  Thoroughly rinse your body with warm water from the neck down.  8.  DO NOT shower/wash with your normal soap after using and  rinsing off the CHG Soap.  9.  Pat yourself dry with a clean towel.            10.  Wear clean pajamas.            11.  Place clean sheets on your bed the night of your first shower and do not sleep with pets.  Day of Surgery  Do not apply any lotions/deodorants the morning of surgery.  Please wear clean clothes to the hospital/surgery center.    Please read over the  fact sheets that you were given:  .

## 2016-02-19 LAB — HEMOGLOBIN A1C
HEMOGLOBIN A1C: 6.7 % — AB (ref 4.8–5.6)
MEAN PLASMA GLUCOSE: 146 mg/dL

## 2016-02-19 NOTE — Progress Notes (Signed)
Anesthesia chart review: Patient is a 65 year old male scheduled for L2-L5 anterior lateral interbody fusion on 02/22/16 by Dr. Cyndy Freeze.  History includes former smoker, CAD/inferior MI s/p PCI PDA and OM2 '00 (known RCA occlusion '13 cath), CHF, AAA s/p Zenith stent graft repair 04/16/06, left to right FFBG with right FPBG (Propaten) and ligation of right popliteal artery aneurysm 04/18/11, left PPBG and ligation of left popliteal artery aneurysm 07/23/11, DVT, laryngeal cancer s/p resection and radiation 2010-2011, cerebrovascular disease, GERD, hiatal hernia, TURP '15, OSA (BiPap), HTN, morbid obesity s/p removal of lap band and laparoscopic creation of sleeve gastrectomy 10/02/15. BMI is consistent with morbid obesity.  - PCP is Dr. Sinda Du. - Cardiologist is Dr. Angelena Form, last visit 02/07/16. He wrote: Pre-operative cardiovascular examination: He has no chest pain or dyspnea suggestive of angina. He is exercising 4 days per week with no trouble. His baseline nuclear stress test shows scar. I do not think repeat stress testing is indicated. He can proceed with his planned surgical procedure without further cardiac workup. - Vascular surgeon is Dr. Ruta Hinds.  Meds include Nexium, hydralazine, irbesartan, metformin, Nitro, KCL, Lyrica, torsemide.   BP (!) 167/80   Pulse 76   Temp 36.6 C   Resp 20   Ht 5\' 10"  (1.778 m)   Wt 292 lb 14.4 oz (132.9 kg)   SpO2 95%   BMI 42.03 kg/m   02/07/16 EKG: SR with first degree AV block with frequent PVCs, non-specific intraventricular block, inferior infarct (age undetermined).  04/25/14 Echo: Study Conclusions - Left ventricle: The cavity size was mildly dilated. Wall thickness was normal. Systolic function was mildly reduced. Theestimated ejection fraction was in the range of 45% to 50%. Doppler parameters are consistent with abnormal left ventricular relaxation (grade 1 diastolic dysfunction). - Left atrium: The atrium was mildly  dilated.  04/20/14 Nuclear stress test: Overall Impression: High risk stress nuclear study with a large, severe, partially reversible inferior lateral defect consistent with prior infarct and mild peri-infarct ischemia towards the apex. LV Ejection Fraction: 32%. LV Wall Motion: Global hypokinesis with akinesis of the inferior lateral wall. Study high risk due to reduced LV function. (EF by echo 45-50%. By 05/05/15 note, Dr. Lysle Dingwall did not recommend further ischemic evaluation at that time.)  03/10/12 Cardiac cath: Left main: No obstructive disease.  Left Anterior Descending Artery:  Large caliber vessel that courses to the apex. The mid vessel has a 50% stenosis which was visualized in multiple views and does not appear to be flow limiting. The first diagonal branch arises from this area of stenosis. This small to moderate sized branch has mild plaque disease. The mid and distal LAD has mild plaque disease.  Circumflex Artery: Large caliber vessel with moderate sized bifurcating first obtuse marginal branch with mild plaque disease. 50% stenosis mid AV groove Circumflex, does not appear flow limiting. The most distal obtuse marginal is small in caliber and has a 70% stenosis. This vessel is too small for PCI.  Right Coronary Artery: This appears to be at least a moderate sized vessel. The proximal vessel has 99% sub-total occlusion followed by a 100% occlusion in the mid vessel.  Left Ventricular Angiogram: Deferred.  Impression: 1. Triple vessel CAD with moderate disease in the LAD and Circumflex, chronic total occlusion of the RCA 2. Inferior wall scar on myoview c/w the chronic total occlusion of the RCA Recommendations: Continue medical management of CAD.        03/19/12 Carotid U/S: Mild plaque in  both bulbs. 0-39% BICA stenoses.  07/11/15 CTA abd/pelvis: IMPRESSION: VASCULAR 1. Stable appearance of successfully repaired infrarenal abdominal aortic aneurysm. The excluded aneurysm sac is  unchanged at 3.5 cm and there is no evidence of endoleak. 2. Stable chronic occlusion of the right iliac limb with a widely patent left-to-right fem-fem bypass graft. 3. Interval occlusion of an incompletely imaged right fem to distal bypass graft. 4. Chronic occlusion of the left superficial femoral artery.  Preoperative labs noted. A1c 6.7.   If no acute changes then I anticipate that he can proceed as planned.  [facility] Short Stay Center/Anesthesiology Phone [phone] 02/19/2016 1:38 PM

## 2016-02-21 MED ORDER — DEXTROSE 5 % IV SOLN
3.0000 g | INTRAVENOUS | Status: AC
  Administered 2016-02-22: 3 g via INTRAVENOUS
  Administered 2016-02-22: 2 g via INTRAVENOUS
  Filled 2016-02-21: qty 3000

## 2016-02-21 NOTE — Telephone Encounter (Signed)
Pt returned call, please call @ [phone]

## 2016-02-21 NOTE — Telephone Encounter (Signed)
I placed call to pt to get BP readings.  Left message to call back

## 2016-02-21 NOTE — Telephone Encounter (Signed)
Spoke with pt. He reports the following BP readings- 10/16-129/52 10/17-130/70 10/18-160/80 10/19-125/79 10/20-141/74 10/21-155/80 10/22-150/79 10/26-155/88 at [facility]  He reports he is having back surgery tomorrow.  Pt states he will resume Avapro at half dose (75 mg) and call us back in a week with readings.  Tiredness did not improve after stopping Avapro

## 2016-02-22 ENCOUNTER — Inpatient Hospital Stay (HOSPITAL_COMMUNITY)
Admission: RE | Admit: 2016-02-22 | Discharge: 2016-02-26 | DRG: 460 | Disposition: A | Discharge status: Home / Self Care | Payer: Medicare Other | Source: Ambulatory Visit | Attending: Neurological Surgery | Admitting: Neurological Surgery

## 2016-02-22 ENCOUNTER — Inpatient Hospital Stay (HOSPITAL_COMMUNITY): Payer: Medicare Other | Admitting: Vascular Surgery

## 2016-02-22 ENCOUNTER — Encounter (HOSPITAL_COMMUNITY): Payer: Self-pay | Admitting: Anesthesiology

## 2016-02-22 ENCOUNTER — Encounter (HOSPITAL_COMMUNITY)
Admission: RE | Disposition: A | Discharge status: Home / Self Care | Payer: Self-pay | Source: Ambulatory Visit | Attending: Neurological Surgery

## 2016-02-22 ENCOUNTER — Inpatient Hospital Stay (HOSPITAL_COMMUNITY): Payer: Medicare Other

## 2016-02-22 ENCOUNTER — Inpatient Hospital Stay (HOSPITAL_COMMUNITY): Payer: Medicare Other | Admitting: Certified Registered Nurse Anesthetist

## 2016-02-22 DIAGNOSIS — G4730 Sleep apnea, unspecified: Secondary | ICD-10-CM | POA: Diagnosis present

## 2016-02-22 DIAGNOSIS — M4727 Other spondylosis with radiculopathy, lumbosacral region: Principal | ICD-10-CM | POA: Diagnosis present

## 2016-02-22 DIAGNOSIS — I110 Hypertensive heart disease with heart failure: Secondary | ICD-10-CM | POA: Diagnosis present

## 2016-02-22 DIAGNOSIS — I509 Heart failure, unspecified: Secondary | ICD-10-CM | POA: Diagnosis present

## 2016-02-22 DIAGNOSIS — T83028A Displacement of other indwelling urethral catheter, initial encounter: Secondary | ICD-10-CM | POA: Diagnosis not present

## 2016-02-22 DIAGNOSIS — E119 Type 2 diabetes mellitus without complications: Secondary | ICD-10-CM | POA: Diagnosis present

## 2016-02-22 DIAGNOSIS — Z955 Presence of coronary angioplasty implant and graft: Secondary | ICD-10-CM | POA: Diagnosis not present

## 2016-02-22 DIAGNOSIS — Z8521 Personal history of malignant neoplasm of larynx: Secondary | ICD-10-CM

## 2016-02-22 DIAGNOSIS — Z7984 Long term (current) use of oral hypoglycemic drugs: Secondary | ICD-10-CM

## 2016-02-22 DIAGNOSIS — Z87891 Personal history of nicotine dependence: Secondary | ICD-10-CM | POA: Diagnosis not present

## 2016-02-22 DIAGNOSIS — M5417 Radiculopathy, lumbosacral region: Secondary | ICD-10-CM | POA: Diagnosis present

## 2016-02-22 DIAGNOSIS — Z79899 Other long term (current) drug therapy: Secondary | ICD-10-CM

## 2016-02-22 DIAGNOSIS — E669 Obesity, unspecified: Secondary | ICD-10-CM | POA: Diagnosis present

## 2016-02-22 DIAGNOSIS — Z9884 Bariatric surgery status: Secondary | ICD-10-CM

## 2016-02-22 DIAGNOSIS — Z9079 Acquired absence of other genital organ(s): Secondary | ICD-10-CM

## 2016-02-22 DIAGNOSIS — Z86718 Personal history of other venous thrombosis and embolism: Secondary | ICD-10-CM | POA: Diagnosis not present

## 2016-02-22 DIAGNOSIS — K219 Gastro-esophageal reflux disease without esophagitis: Secondary | ICD-10-CM | POA: Diagnosis present

## 2016-02-22 DIAGNOSIS — N400 Enlarged prostate without lower urinary tract symptoms: Secondary | ICD-10-CM | POA: Diagnosis present

## 2016-02-22 DIAGNOSIS — Y846 Urinary catheterization as the cause of abnormal reaction of the patient, or of later complication, without mention of misadventure at the time of the procedure: Secondary | ICD-10-CM | POA: Diagnosis not present

## 2016-02-22 DIAGNOSIS — Z6841 Body Mass Index (BMI) 40.0 and over, adult: Secondary | ICD-10-CM | POA: Diagnosis not present

## 2016-02-22 DIAGNOSIS — Z7982 Long term (current) use of aspirin: Secondary | ICD-10-CM

## 2016-02-22 DIAGNOSIS — I252 Old myocardial infarction: Secondary | ICD-10-CM

## 2016-02-22 DIAGNOSIS — Z923 Personal history of irradiation: Secondary | ICD-10-CM | POA: Diagnosis not present

## 2016-02-22 DIAGNOSIS — I2510 Atherosclerotic heart disease of native coronary artery without angina pectoris: Secondary | ICD-10-CM | POA: Diagnosis present

## 2016-02-22 DIAGNOSIS — Z419 Encounter for procedure for purposes other than remedying health state, unspecified: Secondary | ICD-10-CM

## 2016-02-22 HISTORY — PX: CYSTOSCOPY: SHX5120

## 2016-02-22 HISTORY — PX: LUMBAR PERCUTANEOUS PEDICLE SCREW 3 LEVEL: SHX5562

## 2016-02-22 HISTORY — PX: ANTERIOR LAT LUMBAR FUSION: SHX1168

## 2016-02-22 LAB — GLUCOSE, CAPILLARY: Glucose-Capillary: 133 mg/dL — ABNORMAL HIGH (ref 65–99)

## 2016-02-22 SURGERY — ANTERIOR LATERAL LUMBAR FUSION 3 LEVELS
Anesthesia: General | Site: Spine Lumbar

## 2016-02-22 MED ORDER — PHENOL 1.4 % MT LIQD: 1.0000 | OROMUCOSAL | Status: DC | PRN

## 2016-02-22 MED ORDER — BISACODYL 5 MG PO TBEC
5.0000 mg | DELAYED_RELEASE_TABLET | Freq: Every day | ORAL | Status: DC | PRN
  Administered 2016-02-25 – 2016-02-26 (×2): 5 mg via ORAL
  Filled 2016-02-22 (×2): qty 1

## 2016-02-22 MED ORDER — SODIUM CHLORIDE 0.9% FLUSH
3.0000 mL | INTRAVENOUS | Status: DC | PRN
Start: 2016-02-22 — End: 2016-02-26
  Administered 2016-02-25: 3 mL via INTRAVENOUS
  Filled 2016-02-22: qty 3

## 2016-02-22 MED ORDER — FENTANYL CITRATE (PF) 100 MCG/2ML IJ SOLN
INTRAMUSCULAR | Status: AC
  Filled 2016-02-22: qty 4

## 2016-02-22 MED ORDER — SODIUM CHLORIDE 0.9 % IR SOLN
Status: DC | PRN
  Administered 2016-02-22: 500 mL

## 2016-02-22 MED ORDER — FENTANYL CITRATE (PF) 100 MCG/2ML IJ SOLN
INTRAMUSCULAR | Status: AC
  Filled 2016-02-22: qty 2

## 2016-02-22 MED ORDER — MENTHOL 3 MG MT LOZG: 1.0000 | LOZENGE | OROMUCOSAL | Status: DC | PRN

## 2016-02-22 MED ORDER — PREGABALIN 75 MG PO CAPS: 150.0000 mg | ORAL_CAPSULE | Freq: Two times a day (BID) | ORAL | Status: DC | PRN

## 2016-02-22 MED ORDER — EPHEDRINE SULFATE 50 MG/ML IJ SOLN
INTRAMUSCULAR | Status: DC | PRN
  Administered 2016-02-22: 10 mg via INTRAVENOUS

## 2016-02-22 MED ORDER — LACTATED RINGERS IV SOLN
INTRAVENOUS | Status: DC | PRN
  Administered 2016-02-22 (×2): via INTRAVENOUS

## 2016-02-22 MED ORDER — OXYCODONE HCL 5 MG PO TABS
5.0000 mg | ORAL_TABLET | ORAL | Status: DC | PRN
  Administered 2016-02-22 – 2016-02-23 (×5): 10 mg via ORAL
  Administered 2016-02-23: 5 mg via ORAL
  Administered 2016-02-24 – 2016-02-25 (×7): 10 mg via ORAL
  Filled 2016-02-22 (×4): qty 2
  Filled 2016-02-22: qty 1
  Filled 2016-02-22 (×8): qty 2

## 2016-02-22 MED ORDER — SODIUM CHLORIDE 0.9 % IV SOLN: INTRAVENOUS | Status: DC

## 2016-02-22 MED ORDER — ACETAMINOPHEN 10 MG/ML IV SOLN
1000.0000 mg | Freq: Once | INTRAVENOUS | Status: DC
  Filled 2016-02-22: qty 100

## 2016-02-22 MED ORDER — PANTOPRAZOLE SODIUM 20 MG PO TBEC
20.0000 mg | DELAYED_RELEASE_TABLET | Freq: Every day | ORAL | Status: DC
  Administered 2016-02-23 – 2016-02-24 (×2): 20 mg via ORAL
  Filled 2016-02-22 (×4): qty 1

## 2016-02-22 MED ORDER — HYDROMORPHONE HCL 1 MG/ML IJ SOLN
0.2500 mg | INTRAMUSCULAR | Status: DC | PRN
  Administered 2016-02-22: 0.5 mg via INTRAVENOUS

## 2016-02-22 MED ORDER — LIDOCAINE-EPINEPHRINE 2 %-1:100000 IJ SOLN
INTRAMUSCULAR | Status: DC | PRN
  Administered 2016-02-22: 9.5 mL

## 2016-02-22 MED ORDER — SODIUM CHLORIDE 0.9% FLUSH
3.0000 mL | Freq: Two times a day (BID) | INTRAVENOUS | Status: DC
  Administered 2016-02-23 – 2016-02-25 (×5): 3 mL via INTRAVENOUS

## 2016-02-22 MED ORDER — PROPOFOL 10 MG/ML IV BOLUS
INTRAVENOUS | Status: AC
  Filled 2016-02-22: qty 20

## 2016-02-22 MED ORDER — METHOCARBAMOL 750 MG PO TABS
750.0000 mg | ORAL_TABLET | Freq: Four times a day (QID) | ORAL | Status: DC
  Administered 2016-02-22 – 2016-02-26 (×15): 750 mg via ORAL
  Filled 2016-02-22 (×15): qty 1

## 2016-02-22 MED ORDER — MIDAZOLAM HCL 2 MG/2ML IJ SOLN
INTRAMUSCULAR | Status: AC
  Filled 2016-02-22: qty 2

## 2016-02-22 MED ORDER — BUPIVACAINE HCL (PF) 0.25 % IJ SOLN
INTRAMUSCULAR | Status: DC | PRN
  Administered 2016-02-22: 5 mL

## 2016-02-22 MED ORDER — GABAPENTIN 300 MG PO CAPS
300.0000 mg | ORAL_CAPSULE | ORAL | Status: AC
  Administered 2016-02-22: 300 mg via ORAL
  Filled 2016-02-22: qty 1

## 2016-02-22 MED ORDER — LACTATED RINGERS IV SOLN
INTRAVENOUS | Status: DC
  Administered 2016-02-22: 10:00:00 via INTRAVENOUS

## 2016-02-22 MED ORDER — SODIUM CHLORIDE 0.9 % IV SOLN: 250.0000 mL | INTRAVENOUS | Status: DC

## 2016-02-22 MED ORDER — DEXTROSE 5 % IV SOLN
INTRAVENOUS | Status: DC | PRN
  Administered 2016-02-22: 10 ug/min via INTRAVENOUS

## 2016-02-22 MED ORDER — MEPERIDINE HCL 25 MG/ML IJ SOLN: 6.2500 mg | INTRAMUSCULAR | Status: DC | PRN

## 2016-02-22 MED ORDER — GLYCOPYRROLATE 0.2 MG/ML IJ SOLN
INTRAMUSCULAR | Status: DC | PRN
  Administered 2016-02-22: 0.2 mg via INTRAVENOUS

## 2016-02-22 MED ORDER — NITROGLYCERIN 0.4 MG SL SUBL: 0.4000 mg | SUBLINGUAL_TABLET | SUBLINGUAL | Status: DC | PRN

## 2016-02-22 MED ORDER — FENTANYL CITRATE (PF) 250 MCG/5ML IJ SOLN
INTRAMUSCULAR | Status: DC | PRN
  Administered 2016-02-22 (×7): 50 ug via INTRAVENOUS
  Administered 2016-02-22: 100 ug via INTRAVENOUS
  Administered 2016-02-22 (×2): 50 ug via INTRAVENOUS

## 2016-02-22 MED ORDER — LIDOCAINE-EPINEPHRINE 1 %-1:100000 IJ SOLN
INTRAMUSCULAR | Status: AC
  Filled 2016-02-22: qty 1

## 2016-02-22 MED ORDER — THROMBIN 5000 UNITS EX SOLR
OROMUCOSAL | Status: DC | PRN
  Administered 2016-02-22: 5 mL via TOPICAL

## 2016-02-22 MED ORDER — HYDRALAZINE HCL 50 MG PO TABS
50.0000 mg | ORAL_TABLET | Freq: Every day | ORAL | Status: DC
  Administered 2016-02-22 – 2016-02-26 (×5): 50 mg via ORAL
  Filled 2016-02-22 (×5): qty 1

## 2016-02-22 MED ORDER — BUPIVACAINE HCL (PF) 0.5 % IJ SOLN
INTRAMUSCULAR | Status: DC | PRN
  Administered 2016-02-22: 9.5 mL

## 2016-02-22 MED ORDER — BUPIVACAINE HCL (PF) 0.25 % IJ SOLN
INTRAMUSCULAR | Status: AC
  Filled 2016-02-22: qty 30

## 2016-02-22 MED ORDER — LIDOCAINE HCL (CARDIAC) 20 MG/ML IV SOLN
INTRAVENOUS | Status: DC | PRN
  Administered 2016-02-22: 100 mg via INTRATRACHEAL

## 2016-02-22 MED ORDER — HYDROMORPHONE HCL 1 MG/ML IJ SOLN
INTRAMUSCULAR | Status: AC
  Filled 2016-02-22: qty 0.5

## 2016-02-22 MED ORDER — ROCURONIUM BROMIDE 100 MG/10ML IV SOLN
INTRAVENOUS | Status: DC | PRN
  Administered 2016-02-22: 10 mg via INTRAVENOUS

## 2016-02-22 MED ORDER — SODIUM CHLORIDE 0.9 % IJ SOLN
INTRAMUSCULAR | Status: AC
  Filled 2016-02-22: qty 10

## 2016-02-22 MED ORDER — VANCOMYCIN HCL 1000 MG IV SOLR
INTRAVENOUS | Status: DC | PRN
  Administered 2016-02-22: 1000 mg via TOPICAL

## 2016-02-22 MED ORDER — SENNA 8.6 MG PO TABS
1.0000 | ORAL_TABLET | Freq: Two times a day (BID) | ORAL | Status: DC
  Administered 2016-02-22 – 2016-02-26 (×8): 8.6 mg via ORAL
  Filled 2016-02-22 (×8): qty 1

## 2016-02-22 MED ORDER — TORSEMIDE 20 MG PO TABS
20.0000 mg | ORAL_TABLET | Freq: Every day | ORAL | Status: DC
  Administered 2016-02-24 – 2016-02-26 (×3): 20 mg via ORAL
  Filled 2016-02-22 (×3): qty 1

## 2016-02-22 MED ORDER — LACTATED RINGERS IV SOLN
INTRAVENOUS | Status: DC | PRN
Start: 2016-02-22 — End: 2016-02-22
  Administered 2016-02-22 (×3): via INTRAVENOUS

## 2016-02-22 MED ORDER — ACETAMINOPHEN 500 MG PO TABS: 500.0000 mg | ORAL_TABLET | Freq: Four times a day (QID) | ORAL | Status: DC | PRN

## 2016-02-22 MED ORDER — THROMBIN 20000 UNITS EX SOLR
CUTANEOUS | Status: DC | PRN
  Administered 2016-02-22: 20 mL via TOPICAL

## 2016-02-22 MED ORDER — 0.9 % SODIUM CHLORIDE (POUR BTL) OPTIME
TOPICAL | Status: DC | PRN
  Administered 2016-02-22 (×2): 1000 mL

## 2016-02-22 MED ORDER — DOCUSATE SODIUM 100 MG PO CAPS
100.0000 mg | ORAL_CAPSULE | Freq: Two times a day (BID) | ORAL | Status: DC
  Administered 2016-02-22 – 2016-02-26 (×8): 100 mg via ORAL
  Filled 2016-02-22 (×8): qty 1

## 2016-02-22 MED ORDER — FLEET ENEMA 7-19 GM/118ML RE ENEM: 1.0000 | ENEMA | Freq: Once | RECTAL | Status: DC | PRN

## 2016-02-22 MED ORDER — PROMETHAZINE HCL 25 MG/ML IJ SOLN: 6.2500 mg | INTRAMUSCULAR | Status: DC | PRN

## 2016-02-22 MED ORDER — PANTOPRAZOLE SODIUM 40 MG PO TBEC
40.0000 mg | DELAYED_RELEASE_TABLET | Freq: Every day | ORAL | Status: DC
  Administered 2016-02-23 – 2016-02-26 (×5): 40 mg via ORAL
  Filled 2016-02-22 (×4): qty 1

## 2016-02-22 MED ORDER — PHENYLEPHRINE HCL 10 MG/ML IJ SOLN
INTRAMUSCULAR | Status: DC | PRN
  Administered 2016-02-22: 40 ug via INTRAVENOUS

## 2016-02-22 MED ORDER — MIDAZOLAM HCL 5 MG/5ML IJ SOLN
INTRAMUSCULAR | Status: DC | PRN
  Administered 2016-02-22: 2 mg via INTRAVENOUS

## 2016-02-22 MED ORDER — SUCCINYLCHOLINE CHLORIDE 20 MG/ML IJ SOLN
INTRAMUSCULAR | Status: DC | PRN
  Administered 2016-02-22: 120 mg via INTRAVENOUS

## 2016-02-22 MED ORDER — OXYCODONE HCL ER 20 MG PO T12A
20.0000 mg | EXTENDED_RELEASE_TABLET | Freq: Two times a day (BID) | ORAL | Status: DC
  Administered 2016-02-22 – 2016-02-25 (×6): 20 mg via ORAL
  Filled 2016-02-22 (×6): qty 1

## 2016-02-22 MED ORDER — LIDOCAINE-EPINEPHRINE 1 %-1:100000 IJ SOLN
INTRAMUSCULAR | Status: DC | PRN
  Administered 2016-02-22: 20 mL

## 2016-02-22 MED ORDER — CHLORHEXIDINE GLUCONATE CLOTH 2 % EX PADS: 6.0000 | MEDICATED_PAD | Freq: Once | CUTANEOUS | Status: DC

## 2016-02-22 MED ORDER — CEFAZOLIN IN D5W 1 GM/50ML IV SOLN
1.0000 g | Freq: Three times a day (TID) | INTRAVENOUS | Status: AC
  Administered 2016-02-22 – 2016-02-23 (×2): 1 g via INTRAVENOUS
  Filled 2016-02-22 (×2): qty 50

## 2016-02-22 MED ORDER — SODIUM CHLORIDE 0.9 % IJ SOLN
INTRAMUSCULAR | Status: DC | PRN
  Administered 2016-02-22: 10 mL

## 2016-02-22 MED ORDER — ADULT MULTIVITAMIN W/MINERALS CH
1.0000 | ORAL_TABLET | Freq: Every day | ORAL | Status: DC
  Administered 2016-02-23 – 2016-02-26 (×4): 1 via ORAL
  Filled 2016-02-22 (×4): qty 1

## 2016-02-22 MED ORDER — CELECOXIB 200 MG PO CAPS
200.0000 mg | ORAL_CAPSULE | Freq: Two times a day (BID) | ORAL | Status: DC
  Administered 2016-02-22 – 2016-02-26 (×8): 200 mg via ORAL
  Filled 2016-02-22 (×8): qty 1

## 2016-02-22 MED ORDER — ACETAMINOPHEN 500 MG PO TABS
1000.0000 mg | ORAL_TABLET | Freq: Four times a day (QID) | ORAL | Status: DC
  Administered 2016-02-23 (×4): 1000 mg via ORAL
  Administered 2016-02-24: 500 mg via ORAL
  Administered 2016-02-24 – 2016-02-26 (×10): 1000 mg via ORAL
  Filled 2016-02-22 (×17): qty 2

## 2016-02-22 MED ORDER — VANCOMYCIN HCL 1000 MG IV SOLR
INTRAVENOUS | Status: AC
  Filled 2016-02-22: qty 1000

## 2016-02-22 MED ORDER — PROPOFOL 10 MG/ML IV BOLUS
INTRAVENOUS | Status: DC | PRN
  Administered 2016-02-22: 200 mg via INTRAVENOUS
  Administered 2016-02-22: 40 mg via INTRAVENOUS

## 2016-02-22 MED ORDER — VITAMIN D 1000 UNITS PO TABS
2000.0000 [IU] | ORAL_TABLET | ORAL | Status: DC
  Administered 2016-02-25: 2000 [IU] via ORAL
  Filled 2016-02-22 (×3): qty 2

## 2016-02-22 MED ORDER — THROMBIN 5000 UNITS EX SOLR
CUTANEOUS | Status: AC
  Filled 2016-02-22: qty 5000

## 2016-02-22 MED ORDER — BUPIVACAINE LIPOSOME 1.3 % IJ SUSP
INTRAMUSCULAR | Status: DC | PRN
  Administered 2016-02-22: 20 mL

## 2016-02-22 MED ORDER — THROMBIN 20000 UNITS EX SOLR
CUTANEOUS | Status: AC
  Filled 2016-02-22: qty 20000

## 2016-02-22 MED ORDER — ONDANSETRON HCL 4 MG/2ML IJ SOLN: 4.0000 mg | INTRAMUSCULAR | Status: DC | PRN

## 2016-02-22 MED ORDER — LIDOCAINE-EPINEPHRINE 2 %-1:100000 IJ SOLN
INTRAMUSCULAR | Status: AC
  Filled 2016-02-22: qty 1

## 2016-02-22 MED ORDER — BUPIVACAINE LIPOSOME 1.3 % IJ SUSP
20.0000 mL | INTRAMUSCULAR | Status: DC
  Filled 2016-02-22: qty 20

## 2016-02-22 MED ORDER — METHOCARBAMOL 1000 MG/10ML IJ SOLN
750.0000 mg | Freq: Once | INTRAVENOUS | Status: DC
  Filled 2016-02-22: qty 7.5

## 2016-02-22 SURGICAL SUPPLY — 108 items
ADH SKN CLS APL DERMABOND .7 (GAUZE/BANDAGES/DRESSINGS) ×8
APL SKNCLS STERI-STRIP NONHPOA (GAUZE/BANDAGES/DRESSINGS)
BAG DECANTER FOR FLEXI CONT (MISCELLANEOUS) ×4 IMPLANT
BENZOIN TINCTURE PRP APPL 2/3 (GAUZE/BANDAGES/DRESSINGS) ×2 IMPLANT
BLADE CLIPPER SURG (BLADE) IMPLANT
BUR MATCHSTICK NEURO 3.0 LAGG (BURR) ×4 IMPLANT
BUR ROUND FLUTED 5 RND (BURR) ×2 IMPLANT
BUR ROUND FLUTED 5MM RND (BURR)
CANISTER SUCT 3000ML PPV (MISCELLANEOUS) ×6 IMPLANT
CATH COUDE FOLEY 2W 5CC 16FR (CATHETERS) ×4 IMPLANT
CHLORAPREP W/TINT 26ML (MISCELLANEOUS) ×6 IMPLANT
CLIP NEUROVISION LG (CLIP) ×2 IMPLANT
CONT SPEC STER OR (MISCELLANEOUS) ×2 IMPLANT
COVER BACK TABLE 24X17X13 BIG (DRAPES) IMPLANT
DECANTER SPIKE VIAL GLASS SM (MISCELLANEOUS) ×2 IMPLANT
DERMABOND ADVANCED (GAUZE/BANDAGES/DRESSINGS) ×8
DERMABOND ADVANCED .7 DNX12 (GAUZE/BANDAGES/DRESSINGS) ×4 IMPLANT
DRAPE C-ARM 42X72 X-RAY (DRAPES) ×8 IMPLANT
DRAPE C-ARMOR (DRAPES) ×4 IMPLANT
DRAPE LAPAROTOMY 100X72X124 (DRAPES) ×4 IMPLANT
DRAPE MICROSCOPE LEICA (MISCELLANEOUS) IMPLANT
DRAPE POUCH INSTRU U-SHP 10X18 (DRAPES) ×6 IMPLANT
DRAPE SURG 17X23 STRL (DRAPES) ×2 IMPLANT
DRSG OPSITE POSTOP 4X6 (GAUZE/BANDAGES/DRESSINGS) ×4 IMPLANT
DRSG OPSITE POSTOP 4X8 (GAUZE/BANDAGES/DRESSINGS) ×4 IMPLANT
ELECT REM PT RETURN 9FT ADLT (ELECTROSURGICAL) ×8
ELECTRODE REM PT RTRN 9FT ADLT (ELECTROSURGICAL) ×2 IMPLANT
GAUZE SPONGE 4X4 12PLY STRL (GAUZE/BANDAGES/DRESSINGS) IMPLANT
GAUZE SPONGE 4X4 16PLY XRAY LF (GAUZE/BANDAGES/DRESSINGS) IMPLANT
GLOVE BIOGEL PI IND STRL 7.0 (GLOVE) IMPLANT
GLOVE BIOGEL PI IND STRL 7.5 (GLOVE) ×4 IMPLANT
GLOVE BIOGEL PI IND STRL 8 (GLOVE) IMPLANT
GLOVE BIOGEL PI INDICATOR 7.0 (GLOVE) ×12
GLOVE BIOGEL PI INDICATOR 7.5 (GLOVE) ×2
GLOVE BIOGEL PI INDICATOR 8 (GLOVE) ×8
GLOVE ECLIPSE 7.5 STRL STRAW (GLOVE) ×6 IMPLANT
GLOVE ECLIPSE 9.0 STRL (GLOVE) ×2 IMPLANT
GLOVE EXAM NITRILE LRG STRL (GLOVE) IMPLANT
GLOVE EXAM NITRILE XL STR (GLOVE) IMPLANT
GLOVE EXAM NITRILE XS STR PU (GLOVE) IMPLANT
GLOVE SS BIOGEL STRL SZ 7.5 (GLOVE) ×6 IMPLANT
GLOVE SUPERSENSE BIOGEL SZ 7.5 (GLOVE) ×10
GLOVE SURG SS PI 7.0 STRL IVOR (GLOVE) ×8 IMPLANT
GOWN STRL REUS W/ TWL LRG LVL3 (GOWN DISPOSABLE) ×4 IMPLANT
GOWN STRL REUS W/ TWL XL LVL3 (GOWN DISPOSABLE) ×2 IMPLANT
GOWN STRL REUS W/TWL 2XL LVL3 (GOWN DISPOSABLE) ×4 IMPLANT
GOWN STRL REUS W/TWL LRG LVL3 (GOWN DISPOSABLE) ×16
GOWN STRL REUS W/TWL XL LVL3 (GOWN DISPOSABLE) ×4
GUIDEWIRE NITINOL BEVEL TIP (WIRE) ×16 IMPLANT
HEMOSTAT POWDER KIT SURGIFOAM (HEMOSTASIS) ×4 IMPLANT
KIT BASIN OR (CUSTOM PROCEDURE TRAY) ×4 IMPLANT
KIT DILATOR XLIF 5 (KITS) IMPLANT
KIT INFUSE SMALL (Orthopedic Implant) ×2 IMPLANT
KIT ROOM TURNOVER OR (KITS) ×4 IMPLANT
KIT SURGICAL ACCESS MAXCESS 4 (KITS) ×2 IMPLANT
KIT XLIF (KITS) ×2
MODULE NVM5 NEXT GEN EMG (NEEDLE) ×2 IMPLANT
MODULUS XLW 10X22X55MM 10 (Spine Construct) ×1 IMPLANT
MODULUS XLW 12X22X55MM 10 (Spine Construct) ×2 IMPLANT
MODULUS XLW 12X22X60MM 10 (Spine Construct) ×1 IMPLANT
NDL DIAMOND SPRINGLESS III (NEEDLE) IMPLANT
NDL HYPO 21X1.5 SAFETY (NEEDLE) ×4 IMPLANT
NDL HYPO 25X1 1.5 SAFETY (NEEDLE) ×2 IMPLANT
NEEDLE DIAMOND SPRINGLESS III (NEEDLE) ×4 IMPLANT
NEEDLE HYPO 21X1.5 SAFETY (NEEDLE) ×12 IMPLANT
NEEDLE HYPO 25X1 1.5 SAFETY (NEEDLE) IMPLANT
NS IRRIG 1000ML POUR BTL (IV SOLUTION) ×8 IMPLANT
PACK LAMINECTOMY NEURO (CUSTOM PROCEDURE TRAY) ×6 IMPLANT
PACK UNIVERSAL I (CUSTOM PROCEDURE TRAY) ×4 IMPLANT
PAD ARMBOARD 7.5X6 YLW CONV (MISCELLANEOUS) ×26 IMPLANT
PATTIES SURGICAL .5X1.5 (GAUZE/BANDAGES/DRESSINGS) ×4 IMPLANT
PROBE BALL TIP NVM5 SNG USE (BALLOONS) ×2 IMPLANT
PUTTY BONE ATTRAX 10CC STRIP (Putty) ×2 IMPLANT
PUTTY BONE ATTRAX 5CC STRIP (Putty) ×2 IMPLANT
REDUCTION EXT RELINE MAS MOD (Neuro Prosthesis/Implant) ×2 IMPLANT
ROD RELINE MAS LORD 5.5X100MM (Rod) ×4 IMPLANT
RUBBERBAND STERILE (MISCELLANEOUS) IMPLANT
SCREW LOCK RELINE 5.5 TULIP (Screw) ×16 IMPLANT
SCREW RELINE RED 6.5X50MM POLY (Screw) ×14 IMPLANT
SCREW SHANK MAS MOD 6.5X50MM (Screw) ×2 IMPLANT
SPONGE LAP 4X18 X RAY DECT (DISPOSABLE) IMPLANT
SPONGE NEURO XRAY DETECT 1X3 (DISPOSABLE) ×2 IMPLANT
SPONGE SURGIFOAM ABS GEL 100 (HEMOSTASIS) ×4 IMPLANT
STAPLER VISISTAT 35W (STAPLE) ×4 IMPLANT
STRIP SURGICAL 1 X 6 IN (GAUZE/BANDAGES/DRESSINGS) IMPLANT
STRIP SURGICAL 1/2 X 6 IN (GAUZE/BANDAGES/DRESSINGS) IMPLANT
STRIP SURGICAL 1/4 X 6 IN (GAUZE/BANDAGES/DRESSINGS) IMPLANT
STRIP SURGICAL 3/4 X 6 IN (GAUZE/BANDAGES/DRESSINGS) IMPLANT
SUT STRATAFIX MNCRL+ 3-0 PS-2 (SUTURE) ×8
SUT STRATAFIX MONOCRYL 3-0 (SUTURE) ×8
SUT STRATAFIX SPIRAL + 2-0 (SUTURE) ×2 IMPLANT
SUT VIC AB 0 CT1 18XCR BRD8 (SUTURE) ×4 IMPLANT
SUT VIC AB 0 CT1 8-18 (SUTURE) ×8
SUT VIC AB 1 CT1 18XBRD ANBCTR (SUTURE) ×2 IMPLANT
SUT VIC AB 1 CT1 8-18 (SUTURE)
SUT VIC AB 2-0 CT1 18 (SUTURE) ×12 IMPLANT
SUT VIC AB 3-0 SH 8-18 (SUTURE) ×4 IMPLANT
SUT VIC AB 4-0 PS2 27 (SUTURE) ×2 IMPLANT
SUTURE STRATFX MNCRL+ 3-0 PS-2 (SUTURE) IMPLANT
SYR 20CC LL (SYRINGE) ×2 IMPLANT
SYR 30ML LL (SYRINGE) ×6 IMPLANT
SYSTEM NUVAMAP OR (SYSTAGENIX WOUND MANAGEMENT) ×2 IMPLANT
TOWEL OR 17X24 6PK STRL BLUE (TOWEL DISPOSABLE) ×4 IMPLANT
TOWEL OR 17X26 10 PK STRL BLUE (TOWEL DISPOSABLE) ×4 IMPLANT
TRAY FOLEY W/METER SILVER 16FR (SET/KITS/TRAYS/PACK) ×6 IMPLANT
TUBE CONNECTING 12'X1/4 (SUCTIONS)
TUBE CONNECTING 12X1/4 (SUCTIONS) ×2 IMPLANT
WATER STERILE IRR 1000ML POUR (IV SOLUTION) ×4 IMPLANT

## 2016-02-22 NOTE — OR Nursing (Signed)
This patient is having a two-part procedure that requires him to have a foley catheter. Upon inserting the foley size 41f the catheter went well but did not get any urine return even with pressure applied to bladder area.A 16 fr coude was then inserted again without any problem except with little urine return and the baloon was met with resistance in trying to inflate. Pulled back on the catheter and was able to inflate the balloon with 5cc of sterile water. The surgeon was present during this last attempt and he instructed to leave the coude in and when we finish the first part of the procedure we would notify urologist on call.At closing of first part of procedure we notified Dr. MTresa Moorewho was on call for urology. Told Dr. MTresa Moorethat we were closing and would be starting the next procedure soon. Stated he could not come at this time.Informed Dr. MTresa Moorethat patient had only drained 5cc in the bag since insertion. Dr. MTresa Moorewanted to know how long the next procedure was going to take. Dr. DCyndy Freezestated the next procedure would take 3 hours. Dr. MTresa Mooreinstructed uKoreato call him when we were done giving him a 369mute warning and he would come see the patient. Once the first procedure was done we turned patient from a lateral position to supine. EsBryna ColanderN who has had urology experience came in and tried to irrigate and was unsuccessful.The catheter was removed and found that the tip ws bent back. A second 16Fr coude was reinserted and again met resistance in inflating the balloon.Esther twisted the catheter and a clot came through the tubing with urine. The balloon was able to be inflated and patient was draining dark amber urine wiith some blood tinge and flakes of a clot.The second procedure was started at 16:11 and has drained dark,amber blood tinged urine of about 300cc. as of 17:29 with about an hour left in the procedure.Will still call and have the urologist see patient.

## 2016-02-22 NOTE — Anesthesia Procedure Notes (Signed)
Procedure Name: Intubation Date/Time: 02/22/2016 12:04 PM Performed by: Maude Leriche D Pre-anesthesia Checklist: Patient identified, Emergency Drugs available, Suction available, Patient being monitored and Timeout performed Patient Re-evaluated:Patient Re-evaluated prior to inductionOxygen Delivery Method: Circle system utilized Preoxygenation: Pre-oxygenation with 100% oxygen Intubation Type: IV induction Ventilation: Mask ventilation without difficulty and Oral airway inserted - appropriate to patient size Laryngoscope Size: Glidescope (Elective Glide) Grade View: Grade I Tube type: Oral Tube size: 7.5 mm Number of attempts: 1 Airway Equipment and Method: Stylet and Video-laryngoscopy Placement Confirmation: ETT inserted through vocal cords under direct vision,  positive ETCO2 and breath sounds checked- equal and bilateral Secured at: 23 cm Tube secured with: Tape Dental Injury: Teeth and Oropharynx as per pre-operative assessment

## 2016-02-22 NOTE — Transfer of Care (Signed)
Immediate Anesthesia Transfer of Care Note  Patient: Mathew Padilla  Procedure(s) Performed: Procedure(s) with comments: LUMBAR TWO-LUMBAR FIVE ANTERIOR LATERAL LUMBAR INTERBODY FUSION WITH PERCUTANEOUS PEDICLE SCREWS (N/A) - Left side approach LUMBAR PERCUTANEOUS PEDICLE SCREW LUMBAR TWO-LUMBAR FIVE (N/A) CYSTOSCOPY FLEXIBLE WITH URETHER DILATION  WITH CATHETER PLACEMENT  Patient Location: PACU  Anesthesia Type:General  Level of Consciousness: awake and sedated  Airway & Oxygen Therapy: Patient Spontanous Breathing and Patient connected to face mask oxygen  Post-op Assessment: Report given to RN and Post -op Vital signs reviewed and stable  Post vital signs: Reviewed and stable  Last Vitals:  Vitals:   02/22/16 1925 02/22/16 1930  BP: (!) 146/80   Pulse:  71  Resp: 10 (!) 28  Temp:      Last Pain:  Vitals:   02/22/16 1923  TempSrc:   PainSc: 0-No pain      Patients Stated Pain Goal: 8 ([id number])  Complications: No apparent anesthesia complications

## 2016-02-22 NOTE — Telephone Encounter (Signed)
Agree. Thanks. cdm 

## 2016-02-22 NOTE — Brief Op Note (Signed)
02/22/2016  6:47 PM  PATIENT:  Mathew Padilla  65 y.o. male  PRE-OPERATIVE DIAGNOSIS:  LUMBOSACRAL SPONDYLOSIS WITH RADICULOPATHY  POST-OPERATIVE DIAGNOSIS:  LUMBOSACRAL SPONDYLOSIS WITH RADICULOPATHY  PROCEDURE:  Procedure(s) with comments: LUMBAR TWO-LUMBAR FIVE ANTERIOR LATERAL LUMBAR INTERBODY FUSION WITH PERCUTANEOUS PEDICLE SCREWS (N/A) - Left side approach LUMBAR PERCUTANEOUS PEDICLE SCREW LUMBAR TWO-LUMBAR FIVE (N/A)  SURGEON:  Surgeon(s) and Role:    * Kevan Ny Brazen Domangue, MD - Primary    * Earnie Larsson, MD - Assisting  PHYSICIAN ASSISTANT:   ASSISTANTS: Earnie Larsson, MD  ANESTHESIA:   general  EBL:  Total I/O In: 3000 [I.V.:3000] Out: 555 [Urine:430; Blood:125]  BLOOD ADMINISTERED:none  DRAINS: none   LOCAL MEDICATIONS USED:  MARCAINE    and LIDOCAINE   SPECIMEN:  No Specimen  DISPOSITION OF SPECIMEN:  N/A  COUNTS:  YES  TOURNIQUET:  * No tourniquets in log *  DICTATION: .Dragon Dictation  PLAN OF CARE: Admit to inpatient   PATIENT DISPOSITION:  PACU - hemodynamically stable.   Delay start of Pharmacological VTE agent (>24hrs) due to surgical blood loss or risk of bleeding: yes

## 2016-02-22 NOTE — Consult Note (Signed)
Reason for Consult: Difficult Foley  Referring Physician: Daun Peacock MD  Mathew Padilla is an 65 y.o. male.   HPI:   1 - Difficult / Malpositioned Foley - put undergoign extensive spine surgeyr today and noted to have difficult foley with no urine return on initial placement and urgent urology consultation sought. Pt in prone position for procedure therefore we agreed on assessment after prone portions complete. Per record review has h/o TURP. Fem-gem graft in place lower abdomen by review of imaging. TURP defect but very large prostate at 171mL by CT 06/2015 (ellipsoid calculation).  2 - Prostatic Hypertrophy - 172mL prostate by imaging 2017, prior TURP per report.   Today Mr. Mallernee is seen as urgent consult for above.   Past Medical History:  Diagnosis Date  . AAA (abdominal aortic aneurysm) ([place]) 2007   stent graft repair 12/07  . Arteriosclerotic cardiovascular disease (ASCVD)    a. MI in 1999;  b.  PTCA of PDA and 2nd marginal in 3/00;  c. 02/2012 Cath: LM nl, LAD 38m, LCX 34m, OM small, 70, RCA 99p/139m-->Med Rx;  d. 03/2014 MV: EF 32%, inflat infarct w/ mild peri-infarct isch towards apex (similar to 2013 MV); d. 03/2014 Echo: EF 45-50%, Gr 1 DD, mildly dil LA.  Marland Kitchen CAD (coronary artery disease)   . Cerebrovascular disease   . CHF (congestive heart failure) ([place])   . Cholelithiasis 11/28/2010  . Cough, persistent   . Diabetes mellitus without complication ([place])   . Diverticulitis 2006  . DVT (deep venous thrombosis) ([place])   . Elevated PSA 06/2011   a. s/p TURP 01/2014.  Marland Kitchen Gastroesophageal reflux disease    Hiatal hernia; previously treated with PPI  . Hypertension    Mild; controlled with a single agent    . Laryngeal carcinoma ([place]) 2010   resection and radiation therapy 2010-11  . Myocardial infarction   . Obesity    BMI 47; lap band surgery 2010  . 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 Sleep apnea    BiPAP mask utilized        2 yrs sleep study    dr  Redmond Pulling  . Tobacco abuse, in remission    40 pack years; discontinued in 2000    Past Surgical History:  Procedure Laterality Date  . CARDIAC CATHETERIZATION  2000   PCI-stent  . CATARACT EXTRACTION    . COLONOSCOPY  10/2007  . COLONOSCOPY WITH ESOPHAGOGASTRODUODENOSCOPY (EGD)  2009   RMR: He had distal esophageal erosions, patulous GE junction, erosive reflux esophagitis, hiatal hernia, lipoma on the angularis not manipulated, left-sided diverticula.  Marland Kitchen EYE SURGERY    . FEMORAL-FEMORAL BYPASS GRAFT  04/18/2011   Procedure: BYPASS GRAFT FEMORAL-FEMORAL ARTERY;  Surgeon: Theotis Burrow, MD;  Location: [facility] OR;  Service: Vascular;  Laterality: Bilateral;  Left to right femoral to femoral bypass   . FEMORAL-POPLITEAL BYPASS GRAFT  04/18/2011   Procedure: BYPASS GRAFT FEMORAL-POPLITEAL ARTERY;  Surgeon: Theotis Burrow, MD;  Location: [facility] OR;  Service: Vascular;  Laterality: Right;  Right femoral popliteal bypass with propaten gortex graft  . gastic sleeve  07/2015   gastic band removed and have sleeve  . LAPAROSCOPIC GASTRIC BANDING  2010  . LARYNX SURGERY  2010   Resection of carcinoma  . LEFT HEART CATHETERIZATION WITH CORONARY ANGIOGRAM N/A 03/10/2012   Procedure: LEFT HEART CATHETERIZATION WITH CORONARY ANGIOGRAM;  Surgeon: Burnell Blanks, MD;  Location: [facility] CATH LAB;  Service: Cardiovascular;  Laterality: N/A;  . OPEN ANTERIOR SHOULDER RECONSTRUCTION  2011   Left  . PERIPHERAL ARTERIAL STENT GRAFT  2007   for abdominal aortic aneurysm  . PR VEIN BYPASS GRAFT,AORTO-FEM-POP  07-23-11   Left AK to BK popliteal BPG using rev. saphenous vein    Family History  Problem Relation Age of Onset  . Heart attack Father     deceased  . Heart failure Father   . Hyperlipidemia Father   . Hypertension Father   . Heart disease Father   . Lung cancer Mother     deceased   . Hypertension Mother   . Hyperlipidemia Mother   . Heart failure Mother   . Diabetes Mother   . Cancer Sister      vaginal  . Diabetes Sister   . Colon cancer Neg Hx     Social History:  reports that he quit smoking about 18 years ago. His smoking use included Cigarettes. He has a 60.00 pack-year smoking history. He has never used smokeless tobacco. He reports that he does not drink alcohol or use drugs.  Allergies:  Allergies  Allergen Reactions  . Finasteride Other (See Comments)    Unable to void Unable to void Unable to void  . Other Rash    Beta-blockers Beta-blockers Beta-blockers  . Statins Hives and Rash  . Aspirin Other (See Comments)    Cannot take daily, just once in a while Cannot take daily, just once in a while Other reaction(s): GI Upset (intolerance) Cannot take daily, just once in a while Daily dose upsets stomach    Medications: I have reviewed the patient's current medications.  Results for orders placed or performed during the hospital encounter of 02/22/16 (from the past 48 hour(s))  Glucose, capillary     Status: Abnormal   Collection Time: 02/22/16  8:50 AM  Result Value Ref Range   Glucose-Capillary 133 (H) 65 - 99 mg/dL   Comment 1 Notify RN    Comment 2 Document in Chart     Dg Lumbar Spine 1 View  Result Date: 02/22/2016 CLINICAL DATA:  65 y/o M; L2 through L5 posterior instrumented fusion. Fluoro time 5 minutes 39 seconds EXAM: LUMBAR SPINE - 1 VIEW; DG C-ARM GT 120 MIN COMPARISON:  None. FINDINGS: Intraoperative fluoroscopy of posterior instrumented and interbody fusion of L2 through L5. IMPRESSION: Intraoperative fluoroscopy of posterior instrumented and interbody fusion of L2 through L5. Electronically Signed   By: Kristine Garbe M.D.   On: 02/22/2016 18:37   Dg C-arm Gt 120 Min  Result Date: 02/22/2016 CLINICAL DATA:  65 y/o M; L2 through L5 posterior instrumented fusion. Fluoro time 5 minutes 39 seconds EXAM: LUMBAR SPINE - 1 VIEW; DG C-ARM GT 120 MIN COMPARISON:  None. FINDINGS: Intraoperative fluoroscopy of posterior instrumented and  interbody fusion of L2 through L5. IMPRESSION: Intraoperative fluoroscopy of posterior instrumented and interbody fusion of L2 through L5. Electronically Signed   By: Kristine Garbe M.D.   On: 02/22/2016 18:37    Review of Systems  Unable to perform ROS: Intubated   Blood pressure (!) 164/79, pulse 74, temperature 98.1 F (36.7 C), temperature source Oral, resp. rate 20, height 5\' 10"  (1.778 m), weight 132.5 kg (292 lb), SpO2 (!) 9 %. Physical Exam  Constitutional: He appears well-developed.  Obese, intubated in OR 19.   HENT:  Head: Normocephalic.  ETT in place.   Eyes: Pupils are equal, round, and reactive to light.  Cardiovascular: Normal rate.   By  bedside monitor  Respiratory:  Non-coarse on vent  GI: Soft.  Genitourinary:  Genitourinary Comments: UNcircumcised phallus with foley in situ with blood at meatus and significant catheter length visible suggesting malposition.   Neurological:  GCS 3T  Skin: Skin is warm.   CYSTOSCOPY:  After verifying current ABX prophylaxis and repositioning to supine on stretecher in OR 19, in situ malpositioned catheter removed. Sterile filed created by prepping penis with iodine. Flexible cystourethroscopy performed using 36F flexible scope. Likely site of prior balloon deployment noted in bulbar urethra without through and through disruption, True lumen navigated at 12 o clock posiiton and paritally dialated using cystoscopy. TURP defect noted. Bladder with some bloody / proteinacious urine, no large formed clot. 39F council catheter then placed over sensor wire dilating further into urinary bladder, 10cc sterile water into ballon and efflux 300cc light pink urine with some debris after connecting to gravity drainage. Foreskin reduced.   Assessment/Plan:  1 - Difficult / Malpositioned Foley - placed successfully after cysto / wire dilation with 39F council catheter. This catheter will need to remain in situ at least 48 hours. We will  manage. Do not remove unless cleared by me. Some hematuria is expected.   2 - Prostatic Hypertrophy - s/p prior TURP. Consider medical therapy with finasteride if remains symptomatic.  Will follow while in house, please call me directly with questions anytime.     Durga Saldarriaga 02/22/2016, 7:12 PM

## 2016-02-22 NOTE — H&P (Signed)
CC:  No chief complaint on file.   HPI: Mathew Padilla is a 65 y.o. male with lumbosacral spondylosis with radiculopathy refractory to medical treatments.  He presents for elective L2-5 lateral interbody fusion with percutaneous fixation.  He denies changes since clinic.  PMH: Past Medical History:  Diagnosis Date  . AAA (abdominal aortic aneurysm) ([place]) 2007   stent graft repair 12/07  . Arteriosclerotic cardiovascular disease (ASCVD)    a. MI in 1999;  b.  PTCA of PDA and 2nd marginal in 3/00;  c. 02/2012 Cath: LM nl, LAD 48m, LCX 49m, OM small, 70, RCA 99p/134m-->Med Rx;  d. 03/2014 MV: EF 32%, inflat infarct w/ mild peri-infarct isch towards apex (similar to 2013 MV); d. 03/2014 Echo: EF 45-50%, Gr 1 DD, mildly dil LA.  Marland Kitchen CAD (coronary artery disease)   . Cerebrovascular disease   . CHF (congestive heart failure) ([place])   . Cholelithiasis 11/28/2010  . Cough, persistent   . Diabetes mellitus without complication ([place])   . Diverticulitis 2006  . DVT (deep venous thrombosis) ([place])   . Elevated PSA 06/2011   a. s/p TURP 01/2014.  Marland Kitchen Gastroesophageal reflux disease    Hiatal hernia; previously treated with PPI  . Hypertension    Mild; controlled with a single agent    . Laryngeal carcinoma ([place]) 2010   resection and radiation therapy 2010-11  . Myocardial infarction   . Obesity    BMI 47; lap band surgery 2010  . 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 Sleep apnea    BiPAP mask utilized        2 yrs sleep study    dr Redmond Pulling  . Tobacco abuse, in remission    40 pack years; discontinued in 2000    PSH: Past Surgical History:  Procedure Laterality Date  . CARDIAC CATHETERIZATION  2000   PCI-stent  . CATARACT EXTRACTION    . COLONOSCOPY  10/2007  . COLONOSCOPY WITH ESOPHAGOGASTRODUODENOSCOPY (EGD)  2009   RMR: He had distal esophageal erosions, patulous GE junction, erosive reflux esophagitis, hiatal hernia, lipoma on the angularis not manipulated, left-sided  diverticula.  Marland Kitchen EYE SURGERY    . FEMORAL-FEMORAL BYPASS GRAFT  04/18/2011   Procedure: BYPASS GRAFT FEMORAL-FEMORAL ARTERY;  Surgeon: Theotis Burrow, MD;  Location: [facility] OR;  Service: Vascular;  Laterality: Bilateral;  Left to right femoral to femoral bypass   . FEMORAL-POPLITEAL BYPASS GRAFT  04/18/2011   Procedure: BYPASS GRAFT FEMORAL-POPLITEAL ARTERY;  Surgeon: Theotis Burrow, MD;  Location: [facility] OR;  Service: Vascular;  Laterality: Right;  Right femoral popliteal bypass with propaten gortex graft  . gastic sleeve  07/2015   gastic band removed and have sleeve  . LAPAROSCOPIC GASTRIC BANDING  2010  . LARYNX SURGERY  2010   Resection of carcinoma  . LEFT HEART CATHETERIZATION WITH CORONARY ANGIOGRAM N/A 03/10/2012   Procedure: LEFT HEART CATHETERIZATION WITH CORONARY ANGIOGRAM;  Surgeon: Burnell Blanks, MD;  Location: [facility] CATH LAB;  Service: Cardiovascular;  Laterality: N/A;  . OPEN ANTERIOR SHOULDER RECONSTRUCTION  2011   Left  . PERIPHERAL ARTERIAL STENT GRAFT  2007   for abdominal aortic aneurysm  . PR VEIN BYPASS GRAFT,AORTO-FEM-POP  07-23-11   Left AK to BK popliteal BPG using rev. saphenous vein    SH: Social History  Substance Use Topics  . Smoking status: Former Smoker    Packs/day: 2.00    Years: 30.00    Types: Cigarettes  Quit date: 11/26/1997  . Smokeless tobacco: Never Used     Comment: 40 pack years; quit 2000  . Alcohol use No    MEDS: Prior to Admission medications   Medication Sig Start Date End Date Taking? Authorizing Provider  acetaminophen (TYLENOL) 500 MG tablet Take 500 mg by mouth every 6 (six) hours as needed for mild pain.   Yes Historical Provider, MD  Cholecalciferol (VITAMIN D) 2000 units CAPS Take 2,000 Units by mouth once a week.    Yes Historical Provider, MD  Esomeprazole Magnesium (NEXIUM PO) Take 1 tablet by mouth daily as needed (heartburn).   Yes Historical Provider, MD  gabapentin (NEURONTIN) 400 MG capsule Take 400 mg by mouth  daily as needed (pain).   Yes Historical Provider, MD  hydrALAZINE (APRESOLINE) 50 MG tablet Take 50 mg by mouth See admin instructions. Patient only takes when he has elevated blood pressure readings.   Yes Historical Provider, MD  Menthol, Topical Analgesic, (ICY HOT EX) Apply 1 application topically daily as needed (back pain).   Yes Historical Provider, MD  metFORMIN (GLUCOPHAGE) 500 MG tablet Take 500 mg by mouth as needed.   Yes Historical Provider, MD  Multiple Vitamin (MULTIVITAMIN WITH MINERALS) TABS tablet Take 1 tablet by mouth daily.   Yes Historical Provider, MD  nitroGLYCERIN (NITROSTAT) 0.4 MG SL tablet Place 1 tablet (0.4 mg total) under the tongue every 5 (five) minutes as needed for chest pain. 04/01/12  Yes Burnell Blanks, MD  potassium chloride SA (KLOR-CON M20) 20 MEQ tablet TAKE 1 TABLET (20 MEQ TOTAL) BY MOUTH DAILY. 07/03/15  Yes Burnell Blanks, MD  pregabalin (LYRICA) 50 MG capsule Take 50 mg by mouth 2 (two) times daily as needed (pain).   Yes Historical Provider, MD  torsemide (DEMADEX) 20 MG tablet Take 20 mg by mouth daily.   Yes Historical Provider, MD  aspirin EC 81 MG tablet Take 1 tablet (81 mg total) by mouth daily. Patient not taking: Reported on 02/14/2016 02/07/16   Burnell Blanks, MD  irbesartan (AVAPRO) 150 MG tablet Take 1 tablet (150 mg total) by mouth daily. Patient not taking: Reported on 02/14/2016 02/07/16   Burnell Blanks, MD    ALLERGY: Allergies  Allergen Reactions  . Finasteride Other (See Comments)    Unable to void Unable to void Unable to void  . Other Rash    Beta-blockers Beta-blockers Beta-blockers  . Statins Hives and Rash  . Aspirin Other (See Comments)    Cannot take daily, just once in a while Cannot take daily, just once in a while Other reaction(s): GI Upset (intolerance) Cannot take daily, just once in a while Daily dose upsets stomach    ROS: ROS  NEUROLOGIC EXAM: Awake, alert,  oriented Memory and concentration grossly intact Speech fluent, appropriate CN grossly intact Motor exam: Upper Extremities Deltoid Bicep Tricep Grip  Right 5/5 5/5 5/5 5/5  Left 5/5 5/5 5/5 5/5   Lower Extremity IP Quad PF DF EHL  Right 5/5 5/5 5/5 5/5 5/5  Left 5/5 5/5 5/5 5/5 5/5   Sensation grossly intact to LT  IMAGING: No new imaging  IMPRESSION: - 65 y.o. male with lumbosacral spondylosis with radiculopathy  PLAN: - L2-5 lateral interbody fusion with percutaneous pedicle screw fixation.

## 2016-02-22 NOTE — Anesthesia Postprocedure Evaluation (Signed)
Anesthesia Post Note  Patient: Mathew Padilla  Procedure(s) Performed: Procedure(s) (LRB): LUMBAR TWO-LUMBAR FIVE ANTERIOR LATERAL LUMBAR INTERBODY FUSION WITH PERCUTANEOUS PEDICLE SCREWS (N/A) LUMBAR PERCUTANEOUS PEDICLE SCREW LUMBAR TWO-LUMBAR FIVE (N/A) CYSTOSCOPY FLEXIBLE WITH URETHER DILATION  WITH CATHETER PLACEMENT  Patient location during evaluation: PACU Anesthesia Type: General Level of consciousness: awake and alert Pain management: pain level controlled Vital Signs Assessment: post-procedure vital signs reviewed and stable Respiratory status: spontaneous breathing, nonlabored ventilation, respiratory function stable and patient connected to nasal cannula oxygen Cardiovascular status: blood pressure returned to baseline and stable Postop Assessment: no signs of nausea or vomiting Anesthetic complications: no Comments: Foley placed by urology.. To be left in for a few days    Last Vitals:  Vitals:   02/22/16 2014 02/22/16 2015  BP: (!) 150/79   Pulse: 69 69  Resp: 13 16  Temp:      Last Pain:  Vitals:   02/22/16 2000  TempSrc:   PainSc: Asleep    LLE Motor Response: Purposeful movement;Responds to commands (02/22/16 2014) LLE Sensation: Full sensation (02/22/16 2014) RLE Motor Response: Purposeful movement;Responds to commands (02/22/16 2014) RLE Sensation: Full sensation (02/22/16 2014)      Zenaida Deed

## 2016-02-22 NOTE — Anesthesia Preprocedure Evaluation (Signed)
Anesthesia Evaluation  Patient identified by MRN, date of birth, ID band Patient awake    Reviewed: Allergy & Precautions, H&P , NPO status , Patient's Chart, lab work & pertinent test results  Airway Mallampati: III  TM Distance: >3 FB Neck ROM: Limited  Mouth opening: Limited Mouth Opening  Dental  (+) Teeth Intact, Dental Advisory Given   Pulmonary shortness of breath and with exertion, sleep apnea and Continuous Positive Airway Pressure Ventilation , pneumonia, former smoker,    breath sounds clear to auscultation       Cardiovascular hypertension, Pt. on medications + CAD, + Past MI, + Peripheral Vascular Disease and +CHF   Rhythm:Regular Rate:Normal     Neuro/Psych    GI/Hepatic Neg liver ROS, GERD  Medicated and Controlled,  Endo/Other  diabetes, Type 2  Renal/GU negative Renal ROS     Musculoskeletal negative musculoskeletal ROS (+)   Abdominal (+) + obese,   Peds  Hematology negative hematology ROS (+)   Anesthesia Other Findings   Reproductive/Obstetrics                             Anesthesia Physical  Anesthesia Plan  ASA: III  Anesthesia Plan: General   Post-op Pain Management:    Induction: Intravenous  Airway Management Planned: Oral ETT  Additional Equipment: Arterial line  Intra-op Plan:   Post-operative Plan: Extubation in OR  Informed Consent: I have reviewed the patients History and Physical, chart, labs and discussed the procedure including the risks, benefits and alternatives for the proposed anesthesia with the patient or authorized representative who has indicated his/her understanding and acceptance.   Dental advisory given  Plan Discussed with: CRNA  Anesthesia Plan Comments: (2 x PIV)        Anesthesia Quick Evaluation

## 2016-02-22 NOTE — Brief Op Note (Signed)
02/22/2016  7:10 PM  PATIENT:  Mathew Padilla  65 y.o. male  PRE-OPERATIVE DIAGNOSIS:  Difficult Foley, h/o TURP    PROCEDURE:  Cystoscopy with urethral dilation / catheter placement.   SURGEON:      * Alexis Frock, MD - Primary  PHYSICIAN ASSISTANT:   ASSISTANTS: none   ANESTHESIA:   general  EBL:  No intake/output data recorded.  BLOOD ADMINISTERED:none  DRAINS: 95F council foley to gravity.    LOCAL MEDICATIONS USED:  NONE  SPECIMEN:  No Specimen  DISPOSITION OF SPECIMEN:  N/A  COUNTS:  YES  TOURNIQUET:  * No tourniquets in log *  DICTATION: .Note written in EPIC  PLAN OF CARE: Admit to inpatient   PATIENT DISPOSITION:  PACU - hemodynamically stable.   Delay start of Pharmacological VTE agent (>24hrs) due to surgical blood loss or risk of bleeding: not applicable

## 2016-02-23 LAB — CBC
HCT: 40.2 % (ref 39.0–52.0)
Hemoglobin: 13.4 g/dL (ref 13.0–17.0)
MCH: 30.8 pg (ref 26.0–34.0)
MCHC: 33.3 g/dL (ref 30.0–36.0)
MCV: 92.4 fL (ref 78.0–100.0)
PLATELETS: 116 10*3/uL — AB (ref 150–400)
RBC: 4.35 MIL/uL (ref 4.22–5.81)
RDW: 13.9 % (ref 11.5–15.5)
WBC: 10.6 10*3/uL — AB (ref 4.0–10.5)

## 2016-02-23 LAB — BASIC METABOLIC PANEL
Anion gap: 7 (ref 5–15)
BUN: 9 mg/dL (ref 6–20)
CALCIUM: 8.3 mg/dL — AB (ref 8.9–10.3)
CO2: 26 mmol/L (ref 22–32)
Chloride: 103 mmol/L (ref 101–111)
Creatinine, Ser: 0.93 mg/dL (ref 0.61–1.24)
GFR calc Af Amer: >60 mL/min (ref >60)
GLUCOSE: 139 mg/dL — AB (ref 65–99)
Potassium: 3.9 mmol/L (ref 3.5–5.1)
Sodium: 136 mmol/L (ref 135–145)

## 2016-02-23 MED ORDER — ALUM & MAG HYDROXIDE-SIMETH 200-200-20 MG/5ML PO SUSP
30.0000 mL | Freq: Four times a day (QID) | ORAL | Status: DC | PRN
  Administered 2016-02-23 (×2): 30 mL via ORAL
  Filled 2016-02-23 (×2): qty 30

## 2016-02-23 MED ORDER — KETOROLAC TROMETHAMINE 30 MG/ML IJ SOLN
30.0000 mg | Freq: Four times a day (QID) | INTRAMUSCULAR | Status: DC | PRN
  Administered 2016-02-23 – 2016-02-25 (×8): 30 mg via INTRAVENOUS
  Filled 2016-02-23 (×9): qty 1

## 2016-02-23 NOTE — Progress Notes (Signed)
Upon initiation of removal of foley this AM, patient refused. Patient and wife educated about infection risk, discussed plan of care; stated understanding.  Urine drainage continues to be blood tinged. Continue to monitor.

## 2016-02-23 NOTE — Evaluation (Signed)
Occupational Therapy Evaluation Patient Details Name: Mathew Padilla MRN: [id number] DOB: [birth year] Today's Date: 02/23/2016    History of Present Illness 65 y.o. male s/p LUMBAR TWO-LUMBAR FIVE ANTERIOR LATERAL LUMBAR INTERBODY FUSION. Hx of AAA, DVT, and CAD.   Clinical Impression   Pt admitted with above. He demonstrates the below listed deficits and will benefit from continued OT to maximize safety and independence with BADLs.  Pt currently requires max A for LB ADLs and min guard A to min A for functional mobility. Spouse is very supportive.  Will follow acutely.       Follow Up Recommendations  No OT follow up;Supervision/Assistance - 24 hour    Equipment Recommendations  3 in 1 bedside comode    Recommendations for Other Services       Precautions / Restrictions Precautions Precautions: Fall;Back Precaution Booklet Issued: No Precaution Comments: Pt able to state 2/3 back precautions. Pt instructed in third Required Braces or Orthoses: Spinal Brace Spinal Brace: Thoracolumbosacral orthotic;Applied in sitting position Restrictions Weight Bearing Restrictions: No      Mobility Bed Mobility Overal bed mobility: Needs Assistance Bed Mobility: Sit to Sidelying         Sit to sidelying: Min assist General bed mobility comments: verbal cues for technique and assist to lift LEs onto bed   Transfers Overall transfer level: Needs assistance Equipment used: Rolling walker (2 wheeled) Transfers: Sit to/from [facility] Sit to Stand: Min assist Stand pivot transfers: Min guard       General transfer comment: Min A to move into standing from low surface     Balance Overall balance assessment: Needs assistance Sitting-balance support: Feet supported Sitting balance-Leahy Scale: Good     Standing balance support: During functional activity Standing balance-Leahy Scale: Fair                              ADL Overall ADL's :  Needs assistance/impaired Eating/Feeding: Independent   Grooming: Wash/dry hands;Wash/dry face;Oral care;Brushing hair;Min guard;Standing Grooming Details (indicate cue type and reason): pt instructed to use cup when brushing teeth  Upper Body Bathing: Supervision/ safety;Set up;Sitting   Lower Body Bathing: Maximal assistance;Sit to/from stand   Upper Body Dressing : Set up;Supervision/safety;Sitting   Lower Body Dressing: Total assistance;Sit to/from stand   Toilet Transfer: Minimal assistance;Ambulation;Grab bars;RW;BSC   Toileting- Clothing Manipulation and Hygiene: Maximal assistance;Sit to/from stand Toileting - Clothing Manipulation Details (indicate cue type and reason): discussed options for toileting aid      Functional mobility during ADLs: Min guard;Minimal assistance;Rolling walker General ADL Comments: Wife is very supportive.  DIscussed options for AE with pt and wife, but they both report she will assist him with LB ADLs     Vision     Perception     Praxis      Pertinent Vitals/Pain Pain Assessment: 0-10 Pain Score: 3  Pain Location: feet and back  Pain Descriptors / Indicators: Aching Pain Intervention(s): Monitored during session;Repositioned     Hand Dominance Left   Extremity/Trunk Assessment Upper Extremity Assessment Upper Extremity Assessment: Overall WFL for tasks assessed   Lower Extremity Assessment Lower Extremity Assessment: Defer to PT evaluation       Communication Communication Communication: No difficulties   Cognition Arousal/Alertness: Awake/alert Behavior During Therapy: WFL for tasks assessed/performed Overall Cognitive Status: Within Functional Limits for tasks assessed  General Comments       Exercises       Shoulder Instructions      Home Living Family/patient expects to be discharged to:: Private residence Living Arrangements: Spouse/significant other Available Help at Discharge:  Family;Available 24 hours/day Type of Home: House Home Access: Level entry     Home Layout: Multi-level Alternate Level Stairs-Number of Steps: 3 flights Alternate Level Stairs-Rails: Right Bathroom Shower/Tub: Occupational psychologist: Standard     Home Equipment: Cane - single point          Prior Functioning/Environment Level of Independence: Independent with assistive device(s);Needs assistance  Gait / Transfers Assistance Needed: was ambulating with cane ADL's / Homemaking Assistance Needed: Wife assisted pt with socks    Comments: using cane for ambulation, community ambulator, [employer] for exercise 3x/week        OT Problem List: Decreased strength;Decreased activity tolerance;Impaired balance (sitting and/or standing);Decreased knowledge of use of DME or AE;Decreased knowledge of precautions;Obesity;Pain   OT Treatment/Interventions: Self-care/ADL training;DME and/or AE instruction;Therapeutic activities;Patient/family education    OT Goals(Current goals can be found in the care plan section) Acute Rehab OT Goals Patient Stated Goal: go home  OT Goal Formulation: With patient/family Time For Goal Achievement: 03/08/16 Potential to Achieve Goals: Good ADL Goals Pt Will Transfer to Toilet: with supervision;ambulating;regular height toilet;bedside commode;grab bars Pt Will Perform Toileting - Clothing Manipulation and hygiene: with supervision;with adaptive equipment;sit to/from stand Pt Will Perform Tub/Shower Transfer: Shower transfer;with min guard assist;ambulating;rolling walker;3 in 1  OT Frequency: Min 2X/week   Barriers to D/C:            Co-evaluation              End of Session Equipment Utilized During Treatment: Gait belt;Rolling walker;Back brace Nurse Communication: Mobility status  Activity Tolerance: Patient tolerated treatment well Patient left: in bed;with call bell/phone within reach;with bed alarm set;with family/visitor present    Time: [id number] OT Time Calculation (min): 34 min Charges:  OT General Charges $OT Visit: 1 Procedure OT Evaluation $OT Eval Moderate Complexity: 1 Procedure OT Treatments $Self Care/Home Management : 8-22 mins G-Codes:    Kenadi Miltner M Feb 25, 2016, 5:16 PM

## 2016-02-23 NOTE — Progress Notes (Signed)
1 Day Post-Op  Subjective:  1 - Difficult / Malpositioned Foley - s/p urgent cysto / dilation / catheter placement over wire 02/22/16 at time of L spine surgery.   2 - Prostatic Hypertrophy - 141mL prostate by imaging 2017, prior TURP around 2015 by [facility] at [facility] per report. Not presently on any GU specific meds.   3 - Elevated PSA - PSA >100 2015, pt states had negative biopsy prior to TURP. He has major CV comorbidity.   Today "Mathew Padilla" is seen in f/u above. Urine clearing. No events overnight. Hgb and Cr acceptable.    Objective: Vital signs in last 24 hours: Temp:  [96.8 F (36 C)-98.2 F (36.8 C)] 98.2 F (36.8 C) (10/28 0500) Pulse Rate:  [65-96] 96 (10/28 0500) Resp:  [10-29] 18 (10/28 0500) BP: (116-164)/(56-85) 116/56 (10/28 0500) SpO2:  [9 %-100 %] 94 % (10/28 0500) Arterial Line BP: (171-174)/(79-81) 174/81 (10/27 1945) Weight:  [132.5 kg (292 lb)] 132.5 kg (292 lb) (10/27 0936) Last BM Date: 02/21/16  Intake/Output from previous day: 10/27 0701 - 10/28 0700 In: 3600 [I.V.:3500; IV Piggyback:100] Out: 1655 [Urine:1530; Blood:125] Intake/Output this shift: No intake/output data recorded.  General appearance: alert, cooperative and wife at bedside.  Eyes: negative Nose: Nares normal. Septum midline. Mucosa normal. No drainage or sinus tenderness. Throat: lips, mucosa, and tongue normal; teeth and gums normal Back: symmetric, no curvature. ROM normal. No CVA tenderness., dressings in place from recent L spine surgery.  Resp: non-labored on [place] O2  Significant truncal obesity with multiple scars. Buried penis, uncircumcised, no paraphimosis. Foley c/d/i with very light pink urine, no clots.  SCD's in place.   Lab Results:   Recent Labs  02/23/16 0319  WBC 10.6*  HGB 13.4  HCT 40.2  PLT 116*   BMET  Recent Labs  02/23/16 0319  NA 136  K 3.9  CL 103  CO2 26  GLUCOSE 139*  BUN 9  CREATININE 0.93  CALCIUM 8.3*   PT/INR No results for input(s):  LABPROT, INR in the last 72 hours. ABG No results for input(s): PHART, HCO3 in the last 72 hours.  Invalid input(s): PCO2, PO2  Studies/Results: Dg Lumbar Spine 1 View  Result Date: 02/22/2016 CLINICAL DATA:  65 y/o M; L2 through L5 posterior instrumented fusion. Fluoro time 5 minutes 39 seconds EXAM: LUMBAR SPINE - 1 VIEW; DG C-ARM GT 120 MIN COMPARISON:  None. FINDINGS: Intraoperative fluoroscopy of posterior instrumented and interbody fusion of L2 through L5. IMPRESSION: Intraoperative fluoroscopy of posterior instrumented and interbody fusion of L2 through L5. Electronically Signed   By: Kristine Garbe M.D.   On: 02/22/2016 18:37   Dg C-arm Gt 120 Min  Result Date: 02/22/2016 CLINICAL DATA:  65 y/o M; L2 through L5 posterior instrumented fusion. Fluoro time 5 minutes 39 seconds EXAM: LUMBAR SPINE - 1 VIEW; DG C-ARM GT 120 MIN COMPARISON:  None. FINDINGS: Intraoperative fluoroscopy of posterior instrumented and interbody fusion of L2 through L5. IMPRESSION: Intraoperative fluoroscopy of posterior instrumented and interbody fusion of L2 through L5. Electronically Signed   By: Kristine Garbe M.D.   On: 02/22/2016 18:37    Anti-infectives: Anti-infectives    Start     Dose/Rate Route Frequency Ordered Stop   02/22/16 2100  ceFAZolin (ANCEF) IVPB 1 g/50 mL premix     1 g 100 mL/hr over 30 Minutes Intravenous Every 8 hours 02/22/16 2052 02/23/16 0629   02/22/16 1812  vancomycin (VANCOCIN) powder  Status:  Discontinued  As needed 02/22/16 1813 02/22/16 1920   02/22/16 1355  bacitracin 50,000 Units in sodium chloride irrigation 0.9 % 500 mL irrigation  Status:  Discontinued       As needed 02/22/16 1355 02/22/16 1920   02/22/16 0600  ceFAZolin (ANCEF) 3 g in dextrose 5 % 50 mL IVPB     3 g 130 mL/hr over 30 Minutes Intravenous On call to O.R. 02/21/16 1217 02/22/16 1611      Assessment/Plan:  1 - Difficult / Malpositioned Foley - current foley to remain in  place until removed by me, likely tomorrow based on current progress.   2 - Prostatic Hypertrophy - doing well clinically s/p prior TURP. Would strongly consider future finasteride given current gland size should become more symptomatic in future.   3 - Elevated PSA - s/p prior negative BX. NO role for further PSA based screening given comorbidity.  Will follow, please call me directly with questions.      [facility], Myka Hitz 02/23/2016

## 2016-02-23 NOTE — Progress Notes (Signed)
Orthopedic Tech Progress Note Patient Details:  Mathew Padilla 05/26/1950 [id number]  Patient ID: Mathew Padilla, male   DOB: 1951-04-22, 65 y.o.   MRN: [id number]   Mathew Padilla 02/23/2016, 9:30 AM Called in [employer] brace order; spoke with Louie Casa

## 2016-02-23 NOTE — Evaluation (Signed)
Physical Therapy Evaluation Patient Details Name: Mathew Padilla MRN: [id number] DOB: 09-10-1950 Today's Date: 02/23/2016   History of Present Illness  65 y.o. male s/p LUMBAR TWO-LUMBAR FIVE ANTERIOR LATERAL LUMBAR INTERBODY FUSION. Hx of AAA, DVT, and CAD.  Clinical Impression  Patient is seen following the above procedure, presenting with functional limitations due to the deficits listed below (see PT Problem List). Demonstrates good strength and stability with RW. Ambulating up to 80 feet today. Still with BIL foot pain but reports noticeable improvement. Wife will be caring for patient at home. Patient will benefit from skilled PT to increase their independence and safety with mobility to allow discharge to the venue listed below.       Follow Up Recommendations No PT follow up;Supervision/Assistance - 24 hour    Equipment Recommendations  Rolling walker with 5" wheels;3in1 (PT)    Recommendations for Other Services       Precautions / Restrictions Precautions Precautions: Fall;Back Precaution Booklet Issued: No Precaution Comments: Reviewed precautions with pt and wife Required Braces or Orthoses: Spinal Brace Spinal Brace: Thoracolumbosacral orthotic;Applied in sitting position Restrictions Weight Bearing Restrictions: No      Mobility  Bed Mobility Overal bed mobility: Needs Assistance Bed Mobility: Rolling;Sidelying to Sit Rolling: Min guard Sidelying to sit: Min assist       General bed mobility comments: Eduated on log roll technique. VC for sequencing and min assist for trunk support to rise to EOB.  Transfers Overall transfer level: Needs assistance Equipment used: Rolling walker (2 wheeled) Transfers: Sit to/from Stand Sit to Stand: Min guard         General transfer comment: Min guard for safety with cues for hand placement. Slow to rise but stable once upright with RW for support.  Ambulation/Gait Ambulation/Gait assistance: Min  guard Ambulation Distance (Feet): 80 Feet Assistive device: Rolling walker (2 wheeled) Gait Pattern/deviations: Step-through pattern;Decreased stride length;Trunk flexed Gait velocity: decreased Gait velocity interpretation: Below normal speed for age/gender General Gait Details: Intermittent cues for upright posture and walker placement for proximity. Good foot clearance, no buckling noted. RW adjusted appropriately.  Stairs            Wheelchair Mobility    Modified Rankin (Stroke Patients Only)       Balance Overall balance assessment: Needs assistance Sitting-balance support: No upper extremity supported;Feet supported Sitting balance-Leahy Scale: Fair     Standing balance support: No upper extremity supported Standing balance-Leahy Scale: Fair                               Pertinent Vitals/Pain Pain Assessment: 0-10 Pain Score: 3  Pain Location: feet Pain Descriptors / Indicators: Aching Pain Intervention(s): Monitored during session;Repositioned;Limited activity within patient's tolerance    Home Living Family/patient expects to be discharged to:: Private residence Living Arrangements: Spouse/significant other Available Help at Discharge: Family;Available 24 hours/day Type of Home: House Home Access: Level entry     Home Layout: Multi-level Home Equipment: Cane - single point      Prior Function Level of Independence: Independent with assistive device(s)         Comments: using cane for ambulation, community ambulator, [employer] for exercise 3x/week     Hand Dominance   Dominant Hand: Left    Extremity/Trunk Assessment   Upper Extremity Assessment: Defer to OT evaluation           Lower Extremity Assessment: Generalized weakness  Communication   Communication: No difficulties  Cognition Arousal/Alertness: Awake/alert Behavior During Therapy: WFL for tasks assessed/performed Overall Cognitive Status: Within  Functional Limits for tasks assessed                      General Comments General comments (skin integrity, edema, etc.): Wife present and supportive. Educated both on application of TLSO    Exercises General Exercises - Lower Extremity Ankle Circles/Pumps: AROM;Both;20 reps;Seated   Assessment/Plan    PT Assessment Patient needs continued PT services  PT Problem List Decreased strength;Decreased range of motion;Decreased activity tolerance;Decreased balance;Decreased mobility;Decreased knowledge of use of DME;Decreased knowledge of precautions;Obesity;Pain          PT Treatment Interventions DME instruction;Gait training;Functional mobility training;Therapeutic activities;Therapeutic exercise;Balance training;Neuromuscular re-education;Patient/family education    PT Goals (Current goals can be found in the Care Plan section)  Acute Rehab PT Goals Patient Stated Goal: Feel better PT Goal Formulation: With patient Time For Goal Achievement: 03/08/16 Potential to Achieve Goals: Good    Frequency Min 5X/week   Barriers to discharge        Co-evaluation               End of Session Equipment Utilized During Treatment: Gait belt;Back brace Activity Tolerance: Patient tolerated treatment well Patient left: in chair;with call bell/phone within reach;with chair alarm set;with family/visitor present;with SCD's reapplied Nurse Communication: Mobility status         Time: 1136-1206 PT Time Calculation (min) (ACUTE ONLY): 30 min   Charges:   PT Evaluation $PT Eval Low Complexity: 1 Procedure PT Treatments $Therapeutic Activity: 8-22 mins   PT G CodesEllouise Padilla 02/23/2016, 1:54 PM Mathew Padilla [place], [place]

## 2016-02-23 NOTE — Progress Notes (Addendum)
Patient requesting something more for pain 0038; on call paged. Awaiting return page. Toradol 30mg  q6h ordered, placed 0051.   [employer] called, placed order for TLSO brace.

## 2016-02-23 NOTE — Progress Notes (Signed)
Complaining of left hip, leg, foot pain Moving legs well Sensation intact Foley in place Keep foley until d/c'd by urology PT/OT Ok to be up before brace arrives Stable

## 2016-02-24 NOTE — Progress Notes (Signed)
Orthopedic Tech Progress Note Patient Details:  [name] 07-25-1950 [id number] Nurse called about TLSO brace from [facility] the Physical Therapist said TLSO brace is too small. Called [employer] to notify them, [employer] stated they would have someone take care of it first thing Monday to get either an extension for the brace or a custom fit brace. Called nurse back to notify her of the situation. Patient ID: Mathew Padilla, male   DOB: [birth year], 65 y.o.   MRN: [id number]   Charlott Rakes 02/24/2016, 2:54 PM

## 2016-02-24 NOTE — Op Note (Signed)
02/22/2016  6:47 PM  PATIENT:  Mathew Padilla  65 y.o. male  PRE-OPERATIVE DIAGNOSIS:  Lumbosacral spondylosis with radiculopathy; adult degenerative scoliosis  POST-OPERATIVE DIAGNOSIS:  Same  PROCEDURE:  Lateral interbody fusion L2-3, L3-4, L4-5; use of BMP; percutaneous pedicle screw fixation L2-5 bilaterally  SURGEON:  Aldean Ast, MD  ASSISTANTS: Earnie Larsson, MD  ANESTHESIA:   General  DRAINS: None  SPECIMEN:  None  INDICATION FOR PROCEDURE: 65 year old male with lumbosacral spondylosis with radiculopathy refractory to medical management. Patient understood the risks, benefits, and alternatives and potential outcomes and wished to proceed.  PROCEDURE DETAILS: The patient was brought to the operating room.  After smooth induction of general endotracheal anesthesia the patient was placed in the lateral position with the left side up and secured with tape.  Localizing views were taken with fluoroscopy.  The operative site was then prepped and draped in the usual sterile fashion.  The planned incisions were infiltrated with lidocaine with epinephrine.   The skin was sharply incised and the oblique abdominal muscles were opened with monopolar cautery.  I encountered an easily dissected fat plane, consistent with the retroperitoneum.  I then hooked my finger under the abdominal muscles anteriorly and further opened them with monopolar cautery.  I inserted the dilator and approached the middle third of the L4-5 disc space.  I confirmed that I was not in contact with any nerves of the lumbar plexus.  The tract was sequentially dilated and then the retractor was introduced.  I incised the disc space and then passed a Cobb elevator along each endplate and penetrated the annulus on the contralateral side.  I then used box cutters, a paddle, and a rasp to complete the discectomy.  A trial spacer was used to determine appropriate graft size and then a titanium lordotic interbody graft packed  with BMP and beta tricalcium phosphate was inserted.  The retractor was removed after hemostasis was confirmed.   A second incision was made over the L3 vertebral body and the retroperitoneum was again entered as described above.  The retractor was then repositioned to L3-4.  Discectomy was again performed.  A second interbody graft packed with BMP and beta tricalcium phosphate was inserted.  Hemostasis was again confirmed and the retractor was removed.     This process was repeated at L2-3.   Discectomy was performed and spacer placed without complication.   The incisions were closed in layers with interrupted vicryl sutures.  The skin was closed with running monocryl stratafix sutures and dermabond.  The patient was then turned supine on a stretcher.  He was then positioned prone on an open [place] table.  The skin of his low back was clipped of hair and wiped with alcohol.  A K wire and AP fluroscopy was used to plan two incisions off midline that would allow access from L2-5 bilaterally.  The patient was then prepped and draped in the usual sterile fashion.  The skin over each incision was incised sharply and the soft tissues were dissected with monopolar cautery.  I was careful not to violate the fascia.  Using AP and lateral fluoroscopy I inserted K wires into the L2-5 vertebrae by using a Jamshidi needle to cannulate each pedicle with neurophysiologic monitoring to confirm the avoidance of nerve roots.  K-wires were satisfactorily placed and then I dilated over these wires.  I placed screws over the K wires and removed the wires.  I then incised the fascia and passed  an appropriate length lordotic rod on each side.  The rods were reduced and set screws were placed and finally tightened.  I then removed insertion tabs.  I irrigated vigorously and placed vancomycin in the depths of the fascial incisions.  I injected exparel in the deep muscles.  I closed the wound in routine anatomic layers  with interrupted vicryl suture.  I closed the skin with a running monocryl stratafix suture and sealed it with dermabond.  The patient was returned the the stretcher in the supine position and Dr. Tresa Moore took over to deal with his foley catheter.  PATIENT DISPOSITION:  PACu then floor   Delay start of Pharmacological VTE agent (>24hrs) due to surgical blood loss or risk of bleeding:  Yes

## 2016-02-24 NOTE — Progress Notes (Signed)
Postop day 2. Patient doing fairly well. Pain well controlled. Complains of some abdominal distention.  Afebrile. Vitals are stable. Awake and alert. Oriented and appropriate. Cranial nerve function intact. Motor and sensory function extremities normal. Abdomen soft with hypoactive bowel sounds. Wounds clean and dry.  Status post anterior-posterior lumbar fusion. Patient progressing well. Continue efforts at mobilization.

## 2016-02-24 NOTE — Plan of Care (Signed)
Problem: Bladder/Genitourinary: Goal: Urinary functional status for postoperative course will improve Outcome: Not Met (add Reason) Urologist removed foley abt 0730 today, awaiting void.

## 2016-02-24 NOTE — Progress Notes (Signed)
Physical Therapy Treatment Patient Details Name: Mathew Padilla MRN: [id number] DOB: 1950-08-10 Today's Date: 02/24/2016    History of Present Illness 65 y.o. male s/p LUMBAR TWO-LUMBAR FIVE ANTERIOR LATERAL LUMBAR INTERBODY FUSION. Hx of AAA, DVT, and CAD.    PT Comments    Pt progressing towards physical therapy goals. Was able to perform transfers and ambulation with up to mod assist for support. Pt with ill-fitting brace (appears too small). PT yesterday had adjusted brace to largest setting but continues to be too small. RN notified and department paged for larger size. Will continue to follow and progress as able per POC.   Follow Up Recommendations  No PT follow up;Supervision/Assistance - 24 hour     Equipment Recommendations  Rolling walker with 5" wheels;3in1 (PT)    Recommendations for Other Services       Precautions / Restrictions Precautions Precautions: Fall;Back Precaution Booklet Issued: No Precaution Comments: Pt able to state 2/3 back precautions. Pt instructed in third Required Braces or Orthoses: Spinal Brace Spinal Brace: Thoracolumbosacral orthotic;Applied in sitting position Restrictions Weight Bearing Restrictions: No    Mobility  Bed Mobility Overal bed mobility: Needs Assistance Bed Mobility: Rolling;Sidelying to Sit Rolling: Min assist Sidelying to sit: Mod assist       General bed mobility comments: VC's for log roll. Pt unable to complete without railings for support and PT provided mod assist to elevate trunk to full sitting position.   Transfers Overall transfer level: Needs assistance Equipment used: Rolling walker (2 wheeled) Transfers: Sit to/from Stand Sit to Stand: Min assist         General transfer comment: Assist from elevated surface to achieve full standing position. Pt was cued for hand placement on seated surface however pulled up from the walker instead.   Ambulation/Gait Ambulation/Gait assistance: Min  guard Ambulation Distance (Feet): 100 Feet Assistive device: Rolling walker (2 wheeled) Gait Pattern/deviations: Step-through pattern;Decreased stride length Gait velocity: Decreased Gait velocity interpretation: Below normal speed for age/gender General Gait Details: Intermittent rest breaks due to pain. Pt appears SOB however he states he is breathing through the pain.    Stairs            Wheelchair Mobility    Modified Rankin (Stroke Patients Only)       Balance Overall balance assessment: Needs assistance Sitting-balance support: Feet supported;No upper extremity supported Sitting balance-Leahy Scale: Poor Sitting balance - Comments: Pt is not able to sit unsupported on EOB without UE support due to pain.    Standing balance support: No upper extremity supported;During functional activity Standing balance-Leahy Scale: Fair Standing balance comment: Statically                    Cognition Arousal/Alertness: Awake/alert Behavior During Therapy: WFL for tasks assessed/performed Overall Cognitive Status: Within Functional Limits for tasks assessed                      Exercises      General Comments General comments (skin integrity, edema, etc.): Wife present. TLSO appears too small and does not fit properly. Asked RN about getting a larger size and department was paged.       Pertinent Vitals/Pain Pain Assessment: 0-10 Pain Score: 6  Pain Location: Incision site Pain Descriptors / Indicators: Operative site guarding;Sore Pain Intervention(s): Limited activity within patient's tolerance;Monitored during session;Repositioned    Home Living  Prior Function            PT Goals (current goals can now be found in the care plan section) Acute Rehab PT Goals Patient Stated Goal: Home possibly tomorrow PT Goal Formulation: With patient Time For Goal Achievement: 03/08/16 Potential to Achieve Goals: Good Progress  towards PT goals: Progressing toward goals    Frequency    Min 5X/week      PT Plan Current plan remains appropriate    Co-evaluation             End of Session Equipment Utilized During Treatment: Gait belt;Back brace Activity Tolerance: Patient limited by pain Patient left: in chair;with call bell/phone within reach;with chair alarm set;with family/visitor present;with SCD's reapplied     Time: [id number] PT Time Calculation (min) (ACUTE ONLY): 33 min  Charges:  $Gait Training: 23-37 mins                    G Codes:      Rolinda Roan 2016-03-14, 8:55 AM   Rolinda Roan, PT, DPT Acute Rehabilitation Services Pager: [phone]

## 2016-02-24 NOTE — Progress Notes (Signed)
2 Days Post-Op  Subjective:  1 - Difficult / Malpositioned Foley - s/p urgent cysto / dilation / catheter placement over wire 02/22/16 at time of L spine surgery. Removed 10/29.   2 - Prostatic Hypertrophy - 164mL prostate by imaging 2017, prior TURP around 2015 by Hem[facility] at [facility] per report. Not presently on any GU specific meds.   3 - Elevated PSA - PSA >100 2015, pt states had negative biopsy prior to TURP. He has major CV comorbidity.   Today "[name]" is seen is stable. Ambulated x 2 yesterday.   Objective: Vital signs in last 24 hours: Temp:  [97.9 F (36.6 C)-99 F (37.2 C)] 98.4 F (36.9 C) (10/29 0520) Pulse Rate:  [80-88] 80 (10/29 0520) Resp:  [18] 18 (10/29 0520) BP: (113-149)/(49-95) 149/77 (10/29 0520) SpO2:  [92 %-97 %] 97 % (10/29 0520) Last BM Date: 02/21/16  Intake/Output from previous day: 10/28 0701 - 10/29 0700 In: -  Out: 1150 [Urine:1150] Intake/Output this shift: No intake/output data recorded.  General appearance: alert, cooperative, appears stated age and wife at bedside.  Eyes: negative Nose: Nares normal. Septum midline. Mucosa normal. No drainage or sinus tenderness. Throat: lips, mucosa, and tongue normal; teeth and gums normal Resp: non-labored on CPAP Cardio: Nl rate GI: soft, non-tender; bowel sounds normal; no masses,  no organomegaly and obese, multiple scars.  Male genitalia: normal, in situ catheter with clear urine, no blood at meatus. removed.  Extremities: extremities normal, atraumatic, no cyanosis or edema Neurologic: Grossly normal  Lab Results:   Recent Labs  02/23/16 0319  WBC 10.6*  HGB 13.4  HCT 40.2  PLT 116*   BMET  Recent Labs  02/23/16 0319  NA 136  K 3.9  CL 103  CO2 26  GLUCOSE 139*  BUN 9  CREATININE 0.93  CALCIUM 8.3*   PT/INR No results for input(s): LABPROT, INR in the last 72 hours. ABG No results for input(s): PHART, HCO3 in the last 72 hours.  Invalid input(s): PCO2,  PO2  Studies/Results: Dg Lumbar Spine 1 View  Result Date: 02/22/2016 CLINICAL DATA:  65 y/o M; L2 through L5 posterior instrumented fusion. Fluoro time 5 minutes 39 seconds EXAM: LUMBAR SPINE - 1 VIEW; DG C-ARM GT 120 MIN COMPARISON:  None. FINDINGS: Intraoperative fluoroscopy of posterior instrumented and interbody fusion of L2 through L5. IMPRESSION: Intraoperative fluoroscopy of posterior instrumented and interbody fusion of L2 through L5. Electronically Signed   By: Kristine Garbe M.D.   On: 02/22/2016 18:37   Dg C-arm Gt 120 Min  Result Date: 02/22/2016 CLINICAL DATA:  65 y/o M; L2 through L5 posterior instrumented fusion. Fluoro time 5 minutes 39 seconds EXAM: LUMBAR SPINE - 1 VIEW; DG C-ARM GT 120 MIN COMPARISON:  None. FINDINGS: Intraoperative fluoroscopy of posterior instrumented and interbody fusion of L2 through L5. IMPRESSION: Intraoperative fluoroscopy of posterior instrumented and interbody fusion of L2 through L5. Electronically Signed   By: Kristine Garbe M.D.   On: 02/22/2016 18:37    Anti-infectives: Anti-infectives    Start     Dose/Rate Route Frequency Ordered Stop   02/22/16 2100  ceFAZolin (ANCEF) IVPB 1 g/50 mL premix     1 g 100 mL/hr over 30 Minutes Intravenous Every 8 hours 02/22/16 2052 02/23/16 0629   02/22/16 1812  vancomycin (VANCOCIN) powder  Status:  Discontinued       As needed 02/22/16 1813 02/22/16 1920   02/22/16 1355  bacitracin 50,000 Units in sodium chloride irrigation 0.9 %  500 mL irrigation  Status:  Discontinued       As needed 02/22/16 1355 02/22/16 1920   02/22/16 0600  ceFAZolin (ANCEF) 3 g in dextrose 5 % 50 mL IVPB     3 g 130 mL/hr over 30 Minutes Intravenous On call to O.R. 02/21/16 1217 02/22/16 1611      Assessment/Plan:  1 - Difficult / Malpositioned Foley - removed 10/29. Explained to pt he will lkely have small hematuria and some dysuria x few days.    2 - Prostatic Hypertrophy - doing well clinically s/p  prior TURP. Would strongly consider future finasteride given current gland size should become more symptomatic in future.   3 - Elevated PSA - s/p prior negative BX. NO role for further PSA based screening given comorbidity.  He [facility].   Will follow PRN at this point. Please call me directly with questions.   [facility], Earlisha Sharples 02/24/2016

## 2016-02-24 NOTE — Progress Notes (Signed)
Occupational Therapy Treatment and Discharge Patient Details Name: Mathew Padilla MRN: [id number] DOB: Jun 15, 1950 Today's Date: 02/24/2016    History of present illness 65 y.o. male s/p LUMBAR TWO-LUMBAR FIVE ANTERIOR LATERAL LUMBAR INTERBODY FUSION. Hx of AAA, DVT, and CAD.   OT comments  This 65 yo male admitted and underwent above presents to acute OT today for treatment session and all basic ADL education was completed and pt/wife without further questions about basic ADLs. We will D/C from acute OT.  Follow Up Recommendations  No OT follow up;Supervision/Assistance - 24 hour    Equipment Recommendations  3 in 1 bedside comode;Other (comment) (wide)       Precautions / Restrictions Precautions Precautions: Fall;Back Precaution Booklet Issued: No Precaution Comments: Pt able to state 2/3 back precautions. Pt instructed in third Required Braces or Orthoses: Spinal Brace Spinal Brace: Thoracolumbosacral orthotic;Applied in sitting position Restrictions Weight Bearing Restrictions: No       Mobility Bed Mobility       General bed mobility comments: Pt up in recliner upon my arrival  Transfers Overall transfer level: Needs assistance Equipment used: Rolling walker (2 wheeled) Transfers: Sit to/from Stand Sit to Stand: Min guard (increase time and effort)         General transfer comment: Pt ambulated 150 feet with minguard A and RW    Balance Overall balance assessment: Needs assistance Sitting-balance support: Feet supported;Single extremity supported Sitting balance-Leahy Scale: Poor Sitting balance - Comments: Pt is not able to sit forward in recliner without 1 UE support   Standing balance support: Bilateral upper extremity supported;During functional activity Standing balance-Leahy Scale: Poor Standing balance comment: reliant on RW                   ADL                                         General ADL Comments: Wife in room  and reports that she will also A pt with back peri care. I did educate pt and wife on most efficient sequence of getting dressed. Pt wanted to know how long he would have back precautions and I informed him that at least 6-8 weeks but surgeon would let him know for sure.  We did dicuss that he would need to put whatever seat he was going to [place] for shower facing out so that  wife could more easily A him.                Cognition   Behavior During Therapy: WFL for tasks assessed/performed Overall Cognitive Status: Within Functional Limits for tasks assessed                                    Pertinent Vitals/ Pain       Pain Assessment: 0-10 Pain Score: 6  Pain Location: incision site Pain Descriptors / Indicators: Grimacing;Guarding (tending to hold his breath) Pain Intervention(s): Monitored during session;Repositioned            Progress Toward Goals  OT Goals(current goals can now be found in the care plan section)  Progress towards OT goals:  (All education completed)  Acute Rehab OT Goals Patient Stated Goal: Home possibly tomorrow  Plan  (All education completed)       End of  Session Equipment Utilized During Treatment: Rolling walker;Back brace   Activity Tolerance Patient tolerated treatment well   Patient Left in chair;with call bell/phone within reach;with family/visitor present   Nurse Communication Patient requests pain meds        Time: [id number] OT Time Calculation (min): 28 min  Charges: OT General Charges $OT Visit: 1 Procedure OT Treatments $Self Care/Home Management : 23-37 mins  Almon Register [id number] 02/24/2016, 10:03 AM

## 2016-02-25 ENCOUNTER — Encounter (HOSPITAL_COMMUNITY): Payer: Self-pay | Admitting: Neurological Surgery

## 2016-02-25 MED ORDER — DIPHENHYDRAMINE HCL 25 MG PO CAPS
25.0000 mg | ORAL_CAPSULE | Freq: Four times a day (QID) | ORAL | Status: DC | PRN
  Administered 2016-02-25: 50 mg via ORAL
  Filled 2016-02-25: qty 2

## 2016-02-25 MED ORDER — METHOCARBAMOL 750 MG PO TABS: 750.0000 mg | ORAL_TABLET | Freq: Four times a day (QID) | ORAL | 2 refills | Status: DC

## 2016-02-25 MED ORDER — TAPENTADOL HCL 50 MG PO TABS
100.0000 mg | ORAL_TABLET | ORAL | Status: DC | PRN
  Administered 2016-02-26 (×3): 100 mg via ORAL
  Filled 2016-02-25 (×3): qty 2

## 2016-02-25 MED ORDER — OXYCODONE-ACETAMINOPHEN 7.5-325 MG PO TABS: 1.0000 | ORAL_TABLET | ORAL | 0 refills | Status: DC | PRN

## 2016-02-25 MED ORDER — DIPHENHYDRAMINE HCL 50 MG/ML IJ SOLN: 25.0000 mg | Freq: Four times a day (QID) | INTRAMUSCULAR | Status: DC | PRN

## 2016-02-25 MED ORDER — PREGABALIN 50 MG PO CAPS: 100.0000 mg | ORAL_CAPSULE | Freq: Two times a day (BID) | ORAL | 0 refills | Status: DC

## 2016-02-25 NOTE — Progress Notes (Addendum)
Physical Therapy Treatment Patient Details Name: Mathew Padilla MRN: [id number] DOB: January 18, 1951 Today's Date: 02/25/2016    History of Present Illness 65 y.o. male s/p LUMBAR TWO-LUMBAR FIVE ANTERIOR LATERAL LUMBAR INTERBODY FUSION. Hx of AAA, DVT, and CAD.    PT Comments    Patient reported recent walk in the hallway with nursing on PT arrival. Patient/wife with multiple concerns and he states he currently does not feel he can manage at home. Pt reports he will need a hospital bed and a lift chair for his home as bed mobility and sit to stand continue to be difficult for him. (Refused bed mobility at this time as recently got OOB with nursing; stood with very light min assist from recliner). If this equipment present, he feels he can go home (along with wide BSC that OT recommended).    Follow Up Recommendations  Home health PT;Supervision for mobility/OOB     Equipment Recommendations  Hospital bed; patient requesting lift chair   Recommendations for Other Services       Precautions / Restrictions Precautions Precautions: Fall;Back Precaution Comments: Pt able to state 3/3 precautions; pt required education re: rolling and use of rail Required Braces or Orthoses: Spinal Brace Spinal Brace: Thoracolumbosacral orthotic;Applied in sitting position Restrictions Weight Bearing Restrictions: No    Mobility  Bed Mobility               General bed mobility comments: patient up in recliner and did not want to go back to bed (or practice bed mobility); states there is no way his wife can assist him at home on his memory foam mattress; has been dependent on bedrails   Transfers Overall transfer level: Needs assistance Equipment used: Rolling walker (2 wheeled) Transfers: Sit to/from Stand Sit to Stand: Min assist         General transfer comment: Pt reports recently up walking with RN and tired with difficulty coming to stand. Pt did 97% of effort  Ambulation/Gait              General Gait Details: pt declined as he wants to spread his walks out throughout the day   Stairs            Wheelchair Mobility    Modified Rankin (Stroke Patients Only)       Balance                                    Cognition Arousal/Alertness: Awake/alert Behavior During Therapy: WFL for tasks assessed/performed Overall Cognitive Status: Within Functional Limits for tasks assessed                      Exercises      General Comments General comments (skin integrity, edema, etc.): Wife present. Both asking to have brace adjusted (extension piece was delivered 10/29). Re-fit brace to allow pt to have "strings" loose when sitting, and stands he pulls them tight (to accomodate change in his girth sitting vs standing). Patient stood x 1 to assess in standing. Pt/wife expressed concerns re: discharging home today. Discussed what he feels he needs to practice to feel more prepared (bed mobility, sit to stand). States he feels fine walking with walker. Discussed safe technique for ascend/descend stairs. Patient reports he will walk again after lunch.      Pertinent Vitals/Pain Pain Assessment: Faces Faces Pain Scale: Hurts little more Pain Location: Lt  side and back Pain Descriptors / Indicators: Grimacing;Guarding;Sharp Pain Intervention(s): Limited activity within patient's tolerance;Monitored during session;Repositioned    Home Living                      Prior Function            PT Goals (current goals can now be found in the care plan section) Acute Rehab PT Goals Patient Stated Goal: be moving well enough to go home Time For Goal Achievement: 03/08/16 Progress towards PT goals: Not progressing toward goals - comment (fatigue)    Frequency    Min 5X/week      PT Plan Discharge plan needs to be updated;Equipment recommendations need to be updated    Co-evaluation             End of Session Equipment  Utilized During Treatment: Back brace Activity Tolerance: Patient limited by fatigue Patient left: in chair;with call bell/phone within reach;with family/visitor present     Time: 1100-1132 PT Time Calculation (min) (ACUTE ONLY): 32 min  Charges:  $Therapeutic Activity: 8-22 mins $Self Care/Home Management: 8-22                    G Codes:      Kyla Duffy 2016/03/15, 11:54 AM Pager [phone]

## 2016-02-25 NOTE — Consult Note (Signed)
[facility] CM Primary Care Navigator  02/25/2016  Mathew Padilla 1951/03/09 [id number]  Met with patient and wife Mathew Padilla) at the bedside to identify possible discharge needs. Patient states had been having increased/ worsening pain to back radiating to both legs and feet which limits sleep and activity that had led to this admission/ surgery.  Patient endorses Dr. Sinda Du with [facility]) as the primary care provider.   Plan to discharge home with home health services (PT) per PT recommendation. Wife mentioned equipments needed at home that were addressed with Inpatient CM.   Patient shared using [facility] to obtain medications with no problem. He manages his own medications straight out of the bottles as stated.    Wife provides transportation to his doctors' appointments and she serves as the primary caregiver at home per patient. Daughters Mathew Padilla and Mathew Padilla) who live close by will be able to assist when needed per wife.  Patient voiced understanding to call primary care provider's office when discharged, for a post discharge follow-up appointment within a week or sooner if needs arise. Patient letter provided as a reminder.  Both patient and wife denied additional needs or concerns at this   For additional questions please contact:  Edwena Felty A. Hadley Detloff, BSN, RN-BC [facility] PRIMARY CARE Navigator Cell: [phone]

## 2016-02-25 NOTE — Care Management Note (Signed)
Case Management Note  Patient Details  Name: Mathew Padilla MRN: [id number] Date of Birth: [birth year]  Subjective/Objective:                    Action/Plan: Pt with orders for DME for home. CM met with the patient and his wife and they need a bed, wide walker, 3 in 1 (wide) and lift chair. The first 3 were called to [facility] with [facility] DME and the bed and 3 in 1 will go to the patients home. The walker will be delivered to the room. Patient's wife given order for the lift chair. This equipment will have to be procured outside of the hospital. CM will continue to follow for further d/c needs.   Expected Discharge Date:                  Expected Discharge Plan:  Home/Self Care  In-House Referral:     Discharge planning Services  CM Consult  Post Acute Care Choice:  Durable Medical Equipment Choice offered to:  Patient, Spouse  DME Arranged:  3-N-1, Walker wide, Hospital bed DME Agency:  [place]:    [facility] Agency:     Status of Service:  In process, will continue to follow  If discussed at Long Length of Stay Meetings, dates discussed:    Additional Comments:  Pollie Friar, RN 02/25/2016, 3:08 PM

## 2016-02-25 NOTE — Clinical Social Work Note (Signed)
CSW consulted for New SNF. Pt is not recommending follow up. Pt may be ready for discharge today. RNCM following for discharge planning needs. CSW is signing off as no further needs identified.   Darden Dates, MSW, LCSW Clinical Social Worker  [phone]

## 2016-02-25 NOTE — Progress Notes (Signed)
Pain worse at night Requests one more day Awake Alert Moving legs well, some pain with left hip flexion as expected Sensation normal Likely d/c home tomorrow

## 2016-02-26 MED ORDER — TAPENTADOL HCL 100 MG PO TABS: 100.0000 mg | ORAL_TABLET | ORAL | 0 refills | Status: DC | PRN

## 2016-02-26 NOTE — Discharge Summary (Signed)
Physician Discharge Summary  Patient ID: Mathew Padilla MRN: [id number] DOB/AGE: 66-Feb-1952 66 y.o.  Admit date: 02/22/2016 Discharge date: 02/26/2016  Admission Diagnoses:  Discharge Diagnoses:  Active Problems:   Lumbosacral spondylosis with radiculopathy   Discharged Condition: good  Hospital Course: Patient admitted to the hospital where he underwent an uncomplicated three-level anterior and posterior lumbar decompression and fusion. Postoperative patient is progressing well. Back and lower extremity symptoms improved. Making good gains with therapy. Plan is for discharge home with continued outpatient physical and occupational therapy.  Consults:   Significant Diagnostic Studies:   Treatments:   Discharge Exam: Blood pressure (!) 142/82, pulse 86, temperature 97.5 F (36.4 C), temperature source Axillary, resp. rate 18, height 5\' 10"  (1.778 m), weight 132.5 kg (292 lb), SpO2 98 %. Awake and alert. Oriented and appropriate. Speech full. Cranial nerve function intact. Motor and sensory function of the extremities stable. Wound clean and dry. Chest abdomen benign.  Disposition: 02-Transferred to [facility]     Medication List    TAKE these medications   acetaminophen 500 MG tablet Commonly known as:  TYLENOL Take 500 mg by mouth every 6 (six) hours as needed for mild pain.   aspirin EC 81 MG tablet Take 1 tablet (81 mg total) by mouth daily.   gabapentin 400 MG capsule Commonly known as:  NEURONTIN Take 400 mg by mouth daily as needed (pain).   hydrALAZINE 50 MG tablet Commonly known as:  APRESOLINE Take 50 mg by mouth See admin instructions. Patient only takes when he has elevated blood pressure readings.   ICY HOT EX Apply 1 application topically daily as needed (back pain).   irbesartan 150 MG tablet Commonly known as:  AVAPRO Take 1 tablet (150 mg total) by mouth daily.   metFORMIN 500 MG tablet Commonly known as:  GLUCOPHAGE Take 500 mg by  mouth as needed.   methocarbamol 750 MG tablet Commonly known as:  ROBAXIN Take 1 tablet (750 mg total) by mouth 4 (four) times daily.   multivitamin with minerals Tabs tablet Take 1 tablet by mouth daily.   NEXIUM PO Take 1 tablet by mouth daily as needed (heartburn).   nitroGLYCERIN 0.4 MG SL tablet Commonly known as:  NITROSTAT Place 1 tablet (0.4 mg total) under the tongue every 5 (five) minutes as needed for chest pain.   oxyCODONE-acetaminophen 7.5-325 MG tablet Commonly known as:  PERCOCET Take 1-2 tablets by mouth every 4 (four) hours as needed for severe pain.   potassium chloride SA 20 MEQ tablet Commonly known as:  KLOR-CON M20 TAKE 1 TABLET (20 MEQ TOTAL) BY MOUTH DAILY.   pregabalin 50 MG capsule Commonly known as:  LYRICA Take 2 capsules (100 mg total) by mouth 2 (two) times daily. What changed:  how much to take  when to take this  reasons to take this   Tapentadol HCl 100 MG Tabs Take 1 tablet (100 mg total) by mouth every 4 (four) hours as needed for severe pain.   torsemide 20 MG tablet Commonly known as:  DEMADEX Take 20 mg by mouth daily.   Vitamin D 2000 units Caps Take 2,000 Units by mouth once a week.      Follow-up Information    Kevan Ny Ditty, MD Follow up in 3 week(s).   Specialty:  Neurosurgery Contact information: [address] [place]  [address] [phone]           Signed: Charlie Pitter 02/26/2016, 8:52 AM

## 2016-02-26 NOTE — Care Management Note (Addendum)
Case Management Note  Patient Details  Name: Mathew Padilla MRN: [id number] Date of Birth: Nov 24, 1950  Subjective/Objective:                    Action/Plan: Patient discharging home today with his wife. She is providing transportation home. Per wife the DME will arrive at their home between 2-5 pm. Pt has walker in the room. CM left message with Jermaine with [facility] to ensure the 3 in 1 is wide or bariatric. Will update the bedside RN.   Addendum: PT recommending [facility] services. CM spoke to Dr Annette Stable and he is in agreement. CM provided the patient and his wife a list of [facility] agencies in the [place] area. They selected [facility]. Santiago Glad with [facility] notified and accepted the referral.   Expected Discharge Date:                  Expected Discharge Plan:  Home/Self Care  In-House Referral:     Discharge planning Services  CM Consult  Post Acute Care Choice:  Durable Medical Equipment Choice offered to:  Patient, Spouse  DME Arranged:  3-N-1, Walker wide, Hospital bed DME Agency:  [place]:    [facility] Agency:     Status of Service:  Completed, signed off  If discussed at [place] of Stay Meetings, dates discussed:    Additional Comments:  Pollie Friar, RN 02/26/2016, 10:09 AM

## 2016-02-26 NOTE — Care Management Important Message (Signed)
Important Message  Patient Details  Name: Mathew Padilla MRN: [id number] Date of Birth: April 02, 1951   Medicare Important Message Given:  Yes    Orbie Pyo 02/26/2016, 12:36 PM

## 2016-02-26 NOTE — Progress Notes (Signed)
Physical Therapy Note  RN reported patient has walked in the hallway and feels ready to d/c home today. Stopped by his room to answer any further questions he or wife had--they had none. Pt/wife are preparing for d/c.   02/26/2016 Barry Brunner, PT Pager: [phone]

## 2016-02-26 NOTE — Progress Notes (Signed)
Physical Therapy Treatment Patient Details Name: Mathew Padilla MRN: [id number] DOB: [birth year] Today's Date: 02/26/2016    History of Present Illness 65 y.o. male s/p LUMBAR TWO-LUMBAR FIVE ANTERIOR LATERAL LUMBAR INTERBODY FUSION. Hx of AAA, DVT, and CAD.    PT Comments    Patient very groggy and sore this morning. He was slightly agitated and impulsive requiring max cues to adhere to donning his brace prior to standing. Ambulation deferred at patient's insistence (wanted to eat breakfast and wake up). Patient to call for assist when ready to walk.   Follow Up Recommendations  Home health PT;Supervision for mobility/OOB     Equipment Recommendations  Hospital bed;Other (comment);Rolling walker with 5" wheels (pt requesting lift chair; wide RW)    Recommendations for Other Services       Precautions / Restrictions Precautions Precautions: Fall;Back Precaution Comments: Pt groggy from meds, hurting and could not state; reviewed while assisting him OOB Required Braces or Orthoses: Spinal Brace Spinal Brace: Thoracolumbosacral orthotic;Applied in sitting position Restrictions Weight Bearing Restrictions: No    Mobility  Bed Mobility Overal bed mobility: Needs Assistance Bed Mobility: Rolling;Sidelying to Sit Rolling: Min assist Sidelying to sit: Min guard;HOB elevated       General bed mobility comments: pt now to have hospital bed at home; educated on safe use of rail (how to avoid twisting while using); wife assisted his feet off EOB and to floor (while PT educated her to protect her back and to let pt do this himself); pt trying to reach/pull on wife's arm to come to sit and re-directed him to use the bed rail  Transfers Overall transfer level: Needs assistance Equipment used: Rolling walker (2 wheeled) Transfers: Sit to/from Stand Sit to Stand: Min guard;From elevated surface         General transfer comment: pt hurting and trying to stand up prior to donning  brace; required repeated verbal and tactile cues to regain his attention and prevent standing until brace donned  Ambulation/Gait Ambulation/Gait assistance: Min guard Ambulation Distance (Feet): 3 Feet Assistive device: Rolling walker (2 wheeled) Gait Pattern/deviations: Step-to pattern Gait velocity: Decreased Gait velocity interpretation: Below normal speed for age/gender General Gait Details: bed to chair; pt refused farther due to wanting to eat and wake up better   Stairs            Wheelchair Mobility    Modified Rankin (Stroke Patients Only)       Balance                                    Cognition Arousal/Alertness: Lethargic;Suspect due to medications Behavior During Therapy: Impulsive;Agitated Overall Cognitive Status: Impaired/Different from baseline Area of Impairment: Attention;Following commands;Safety/judgement;Problem solving   Current Attention Level: Sustained Memory: Decreased recall of precautions Following Commands: Follows one step commands inconsistently;Follows one step commands with increased time Safety/Judgement: Decreased awareness of safety   Problem Solving: Slow processing;Difficulty sequencing;Requires verbal cues;Requires tactile cues General Comments: due to meds; groggy    Exercises      General Comments General comments (skin integrity, edema, etc.): Wife present for all education. Educated pt to call for assist to walk when he is ready and hopefully RN, NT or myself will be available. pt/wife verbalized understanding and need to walk to assist with relieving constipation      Pertinent Vitals/Pain Pain Assessment: Faces Faces Pain Scale: Hurts even more Pain Location: Lt side  and back Pain Descriptors / Indicators: Grimacing;Guarding;Sore Pain Intervention(s): Limited activity within patient's tolerance;Monitored during session;Repositioned;Premedicated before session    Home Living                       Prior Function            PT Goals (current goals can now be found in the care plan section) Acute Rehab PT Goals Patient Stated Goal: be moving well enough to go home Time For Goal Achievement: 03/08/16 Progress towards PT goals: Not progressing toward goals - comment (groggy, pain)    Frequency    Min 5X/week      PT Plan Current plan remains appropriate    Co-evaluation             End of Session Equipment Utilized During Treatment: Back brace;Gait belt Activity Tolerance: Patient limited by pain;Patient limited by lethargy Patient left: in chair;with call bell/phone within reach;with family/visitor present     Time: [id number] PT Time Calculation (min) (ACUTE ONLY): 32 min  Charges:  $Therapeutic Activity: 23-37 mins                    G Codes:      Mathew Padilla 25-Mar-2016, 10:45 AM  Pager [phone]

## 2016-02-26 NOTE — Progress Notes (Signed)
Pt aware d/c order placed. Wants to attempt BM before leaving. Tylenol administered for pain. Wendee Copp

## 2016-03-13 ENCOUNTER — Encounter: Payer: Self-pay | Admitting: Family

## 2016-03-13 ENCOUNTER — Telehealth: Payer: Self-pay

## 2016-03-13 DIAGNOSIS — R238 Other skin changes: Secondary | ICD-10-CM

## 2016-03-13 DIAGNOSIS — R231 Pallor: Secondary | ICD-10-CM

## 2016-03-13 DIAGNOSIS — Z9889 Other specified postprocedural states: Secondary | ICD-10-CM

## 2016-03-13 NOTE — Telephone Encounter (Signed)
Discussed pt's symptoms reported from Physical Therapist with Dr. Oneida Alar.  Recommended if the pt's symptoms are acute, then he should go to the ER, and that he definitely should be evaluated tomorrow in office with ABI's, to check patency of Fem-Fem. BP.  Phone call to pt.  Spoke with pt's wife.  She stated the toes have a pink-red color change over past 48 hrs.  She stated the right lower extremity "has been cool intermittently, and sometimes is worse than other times."  Stated the pt. denies pain in the right leg, but does c/o pain in the left leg.  Advised of Dr. Oneida Alar recommendations to come to [facility] tomorrow for ABI's and appt for further eval.  Advised that if pt. Has worsening color change in right foot, rest pain, or if the temp. Of right LE becomes ice cold, to take pt. To the ER.  Wife verb. Understanding/ agreed with appt.

## 2016-03-13 NOTE — Telephone Encounter (Signed)
rec'd call from Physical Therapist.  Reported he is following pt. After multi-level spinal fusion.  Reported new changes in right LE.  Reported over past 24-48 hrs, the wife reported increased redness of the toes of right foot; described as "red from tips of toes to the proximal joint", and that the right lower leg "cool to touch."  Physical Therapist also reported the pulses in right LE are "barely" palpable.  Stated the pt. Denied any pain in right LE.  Noted no change in the color of toes with position change.  Denied any open sores.  Reported pt. c/o numbness in bilat. feet.

## 2016-03-14 ENCOUNTER — Ambulatory Visit (INDEPENDENT_AMBULATORY_CARE_PROVIDER_SITE_OTHER): Payer: Medicare Other | Admitting: Family

## 2016-03-14 ENCOUNTER — Other Ambulatory Visit: Payer: Self-pay

## 2016-03-14 ENCOUNTER — Encounter: Payer: Self-pay | Admitting: Family

## 2016-03-14 ENCOUNTER — Ambulatory Visit (HOSPITAL_COMMUNITY)
Admission: RE | Admit: 2016-03-14 | Discharge: 2016-03-14 | Disposition: A | Discharge status: Home / Self Care | Payer: Medicare Other | Source: Ambulatory Visit | Attending: Vascular Surgery | Admitting: Vascular Surgery

## 2016-03-14 VITALS — BP 133/81 | HR 73 | Temp 98.4°F | Resp 18 | Ht 71.0 in | Wt 280.0 lb

## 2016-03-14 DIAGNOSIS — Z87891 Personal history of nicotine dependence: Secondary | ICD-10-CM | POA: Diagnosis not present

## 2016-03-14 DIAGNOSIS — I739 Peripheral vascular disease, unspecified: Secondary | ICD-10-CM | POA: Diagnosis not present

## 2016-03-14 DIAGNOSIS — R238 Other skin changes: Secondary | ICD-10-CM

## 2016-03-14 DIAGNOSIS — I714 Abdominal aortic aneurysm, without rupture, unspecified: Secondary | ICD-10-CM

## 2016-03-14 DIAGNOSIS — R231 Pallor: Secondary | ICD-10-CM | POA: Diagnosis not present

## 2016-03-14 DIAGNOSIS — Z95828 Presence of other vascular implants and grafts: Secondary | ICD-10-CM | POA: Diagnosis not present

## 2016-03-14 DIAGNOSIS — Z9889 Other specified postprocedural states: Secondary | ICD-10-CM | POA: Diagnosis not present

## 2016-03-14 NOTE — Patient Instructions (Signed)

## 2016-03-14 NOTE — Progress Notes (Signed)
[facility] [place]   CC: Follow up Abdominal Aortic Aneurysm  History of Present Illness  Mathew Padilla is a 65 y.o. (1951/01/13) male patient of Dr. Oneida Alar who is s/p Zenith aneurysm stent graft repair in 2007. The patient denies new abdominal or back pain. He has also undergone a left-to-right femoral-femoral bypass for limb occlusion of a Zenith graft. At the same time he had a right femoral to below-knee popliteal bypass with propaten. This was in December of 2012. He subsequently underwent a left above-knee to below-knee popliteal bypass with vein in March of 2013. This is chronically occluded. He reports no claudication symptoms.  He complains of burning and stinging in both feet consistent with neuropathy. He has had minimal relief with Lyrica or Neurontin. The patient's atherosclerotic risk factors remain obesity, hypertension. These are all currently stable and followed by his primary care physician.  Dr. Oneida Alar last saw pt on 07/18/15. At that time he was doing well status post Zenith stent graft repair aneurysm as well as bilateral popliteal aneurysm repairs. Chronic occlusion of left leg bypass graft, with chronic occlusion of right leg bypass as well. He does have some mild claudication symptoms but is not really disabled by this.  Pt was to follow-up with Dr. Oneida Alar in 1 year to reexamine his stent graft with a CTA abd/pelvis.   Pt returns today after rec'd call from Physical Therapist yesterday.  Reported he is following pt. After multi-level spinal fusion.  Reported new changes in right LE.  Reported over past 24-48 hrs, the wife reported increased redness of the toes of right foot; described as "red from tips of toes to the proximal joint", and that the right lower leg "cool to touch."  Physical Therapist also reported the pulses in right LE are "barely" palpable.  Stated the pt. Denied any pain in right LE.  Noted no change in the color of toes with position  change.  Denied any open sores.  Reported pt. c/o numbness in bilat. feet.     Pt Diabetic: Yes, last A1C was 6.7 on 02/18/16, states he rarely takes metformin, has lost a great deal of weight since the gastric sleeve procedure in June 2017. Pt smoker: former smoker, quit in 1999, started about age 87  Past Medical History:  Diagnosis Date  . AAA (abdominal aortic aneurysm) ([place]) 2007   stent graft repair 12/07  . Arteriosclerotic cardiovascular disease (ASCVD)    a. MI in 1999;  b.  PTCA of PDA and 2nd marginal in 3/00;  c. 02/2012 Cath: LM nl, LAD 33m, LCX 66m, OM small, 70, RCA 99p/136m-->Med Rx;  d. 03/2014 MV: EF 32%, inflat infarct w/ mild peri-infarct isch towards apex (similar to 2013 MV); d. 03/2014 Echo: EF 45-50%, Gr 1 DD, mildly dil LA.  Marland Kitchen CAD (coronary artery disease)   . Cerebrovascular disease   . CHF (congestive heart failure) ([place])   . Cholelithiasis 11/28/2010  . Cough, persistent   . Diabetes mellitus without complication ([place])   . Diverticulitis 2006  . DVT (deep venous thrombosis) ([place])   . Elevated PSA 06/2011   a. s/p TURP 01/2014.  Marland Kitchen Gastroesophageal reflux disease    Hiatal hernia; previously treated with PPI  . Hypertension    Mild; controlled with a single agent    . Laryngeal carcinoma ([place]) 2010   resection and radiation therapy 2010-11  . Myocardial infarction   . Obesity    BMI 47; lap band surgery 2010  .  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 Sleep apnea    BiPAP mask utilized        2 yrs sleep study    dr Redmond Pulling  . Tobacco abuse, in remission    40 pack years; discontinued in 2000   Past Surgical History:  Procedure Laterality Date  . ANTERIOR LAT LUMBAR FUSION N/A 02/22/2016   Procedure: LUMBAR TWO-LUMBAR FIVE ANTERIOR LATERAL LUMBAR INTERBODY FUSION WITH PERCUTANEOUS PEDICLE SCREWS;  Surgeon: Kevan Ny Ditty, MD;  Location: [facility];  Service: Neurosurgery;  Laterality: N/A;  Left side approach  . CARDIAC CATHETERIZATION   2000   PCI-stent  . CATARACT EXTRACTION    . COLONOSCOPY  10/2007  . COLONOSCOPY WITH ESOPHAGOGASTRODUODENOSCOPY (EGD)  2009   RMR: He had distal esophageal erosions, patulous GE junction, erosive reflux esophagitis, hiatal hernia, lipoma on the angularis not manipulated, left-sided diverticula.  . CYSTOSCOPY  02/22/2016   Procedure: CYSTOSCOPY FLEXIBLE WITH URETHER DILATION  WITH CATHETER PLACEMENT;  Surgeon: Alexis Frock, MD;  Location: [facility];  Service: Urology;;  . EYE SURGERY    . FEMORAL-FEMORAL BYPASS GRAFT  04/18/2011   Procedure: BYPASS GRAFT FEMORAL-FEMORAL ARTERY;  Surgeon: Theotis Burrow, MD;  Location: [facility];  Service: Vascular;  Laterality: Bilateral;  Left to right femoral to femoral bypass   . FEMORAL-POPLITEAL BYPASS GRAFT  04/18/2011   Procedure: BYPASS GRAFT FEMORAL-POPLITEAL ARTERY;  Surgeon: Theotis Burrow, MD;  Location: [facility] OR;  Service: Vascular;  Laterality: Right;  Right femoral popliteal bypass with propaten gortex graft  . gastic sleeve  07/2015   gastic band removed and have sleeve  . LAPAROSCOPIC GASTRIC BANDING  2010  . LARYNX SURGERY  2010   Resection of carcinoma  . LEFT HEART CATHETERIZATION WITH CORONARY ANGIOGRAM N/A 03/10/2012   Procedure: LEFT HEART CATHETERIZATION WITH CORONARY ANGIOGRAM;  Surgeon: Burnell Blanks, MD;  Location: [facility];  Service: Cardiovascular;  Laterality: N/A;  . LUMBAR PERCUTANEOUS PEDICLE SCREW 3 LEVEL N/A 02/22/2016   Procedure: LUMBAR PERCUTANEOUS PEDICLE SCREW LUMBAR TWO-LUMBAR FIVE;  Surgeon: Kevan Ny Ditty, MD;  Location: [facility];  Service: Neurosurgery;  Laterality: N/A;  . OPEN ANTERIOR SHOULDER RECONSTRUCTION  2011   Left  . PERIPHERAL ARTERIAL STENT GRAFT  2007   for abdominal aortic aneurysm  . PR VEIN BYPASS GRAFT,AORTO-FEM-POP  07-23-11   Left AK to BK popliteal BPG using rev. saphenous vein   Social History Social History   Social History  . Marital status: Married    Spouse name: N/A  .  Number of children: 3  . Years of education: N/A   Occupational History  . Retired, part Doctor, general practice    Social History Main Topics  . Smoking status: Former Smoker    Packs/day: 2.00    Years: 30.00    Types: Cigarettes    Quit date: 11/26/1997  . Smokeless tobacco: Never Used     Comment: 40 pack years; quit 2000  . Alcohol use No  . Drug use: No  . Sexual activity: Yes   Other Topics Concern  . Not on file   Social History Narrative   Married, past employed, does not get regular exercise.    Family History Family History  Problem Relation Age of Onset  . Heart attack Father     deceased  . Heart failure Father   . Hyperlipidemia Father   . Hypertension Father   . Heart disease Father   . Lung cancer Mother  deceased   . Hypertension Mother   . Hyperlipidemia Mother   . Heart failure Mother   . Diabetes Mother   . Cancer Sister     vaginal  . Diabetes Sister   . Colon cancer Neg Hx     Current Outpatient Prescriptions on File Prior to Visit  Medication Sig Dispense Refill  . acetaminophen (TYLENOL) 500 MG tablet Take 500 mg by mouth every 6 (six) hours as needed for mild pain.    Marland Kitchen aspirin EC 81 MG tablet Take 1 tablet (81 mg total) by mouth daily. (Patient not taking: Reported on 02/14/2016) 90 tablet 3  . Cholecalciferol (VITAMIN D) 2000 units CAPS Take 2,000 Units by mouth once a week.     . Esomeprazole Magnesium (NEXIUM PO) Take 1 tablet by mouth daily as needed (heartburn).    . gabapentin (NEURONTIN) 400 MG capsule Take 400 mg by mouth daily as needed (pain).    . hydrALAZINE (APRESOLINE) 50 MG tablet Take 50 mg by mouth See admin instructions. Patient only takes when he has elevated blood pressure readings.    . irbesartan (AVAPRO) 150 MG tablet Take 1 tablet (150 mg total) by mouth daily. (Patient not taking: Reported on 02/14/2016) 30 tablet 11  . Menthol, Topical Analgesic, (ICY HOT EX) Apply 1 application topically daily as needed (back pain).    .  metFORMIN (GLUCOPHAGE) 500 MG tablet Take 500 mg by mouth as needed.    . methocarbamol (ROBAXIN) 750 MG tablet Take 1 tablet (750 mg total) by mouth 4 (four) times daily. 120 tablet 2  . Multiple Vitamin (MULTIVITAMIN WITH MINERALS) TABS tablet Take 1 tablet by mouth daily.    . nitroGLYCERIN (NITROSTAT) 0.4 MG SL tablet Place 1 tablet (0.4 mg total) under the tongue every 5 (five) minutes as needed for chest pain. 25 tablet 11  . oxyCODONE-acetaminophen (PERCOCET) 7.5-325 MG tablet Take 1-2 tablets by mouth every 4 (four) hours as needed for severe pain. 90 tablet 0  . potassium chloride SA (KLOR-CON M20) 20 MEQ tablet TAKE 1 TABLET (20 MEQ TOTAL) BY MOUTH DAILY. 30 tablet 6  . pregabalin (LYRICA) 50 MG capsule Take 2 capsules (100 mg total) by mouth 2 (two) times daily. 120 capsule 0  . tapentadol 100 MG TABS Take 1 tablet (100 mg total) by mouth every 4 (four) hours as needed for severe pain. 80 tablet 0  . torsemide (DEMADEX) 20 MG tablet Take 20 mg by mouth daily.     No current facility-administered medications on file prior to visit.    Allergies  Allergen Reactions  . Finasteride Other (See Comments)    Unable to void Unable to void Unable to void  . Other Rash    Beta-blockers Beta-blockers Beta-blockers  . Statins Hives and Rash  . Aspirin Other (See Comments)    Cannot take daily, just once in a while Cannot take daily, just once in a while Other reaction(s): GI Upset (intolerance) Cannot take daily, just once in a while Daily dose upsets stomach    ROS: See HPI for pertinent positives and negatives.  Physical Examination  Vitals:   03/14/16 1112  BP: 133/81  Pulse: 73  Resp: 18  Temp: 98.4 F (36.9 C)  SpO2: 95%  Weight: 280 lb (127 kg)  Height: 5\' 11"  (1.803 m)   Body mass index is 39.05 kg/m.  General: A&O x 3, WD, obese male.  Pulmonary: Sym exp, respirations are non labored, good air movt, CTAB, no  rales, rhonchi, or wheezing.  Cardiac: RRR, Nl  S1, S2, no detected murmur.   Carotid Bruits Right Left   Negative Negative   Aorta is not palpable Radial pulses are 2+ palpable and =                          VASCULAR EXAM:                                                                                                         LE Pulses Right Left       FEMORAL   palpable   palpable        POPLITEAL  not palpable   not palpable       POSTERIOR TIBIAL  not palpable   not palpable        DORSALIS PEDIS      ANTERIOR TIBIAL not palpable  not palpable      Gastrointestinal: soft, NTND, -G/R, - HSM, - masses palpated, - CVAT B.  Musculoskeletal: M/S 5/5 throughout except 4/5 in LE's, Extremities without ischemic changes except dependent rubor in left toes and plantar aspect of left foot that mostly resolves with elevation of left foot above his heart. Wearing back brace.  Neurologic: CN 2-12 intact, Pain and light touch intact in extremities are intact, Motor exam as listed above.    Medical Decision Making  The patient is a 65 y.o. male who is s/p Zenith aneurysm stent graft repair in 2007.  He has also undergone a left-to-right femoral-femoral bypass for limb occlusion of a Zenith graft in 2012 by Dr. Trula Slade. At the same time he had a right femoral to below-knee popliteal bypass with propaten in 2012 by Dr. Trula Slade. This was in December of 2012. He subsequently underwent a left above-knee to below-knee popliteal bypass with vein in March of 2013. This is chronically occluded. He reports no claudication symptoms.  Since the June 2017 gastric sleeve procedure, he has lost about 50 pounds.  He is recovering from recent multilevel spine surgery.   DATA Right ABI declined from 0.95 with bi and triphasic waveforms on 06-27-15, to 0.70 today with monophasic waveforms, toes pressure of 94. Left ABI improved slightly from 0.61 to 0.65, monophasic waveforms, toe pressure of 80.   Based on this patient's exam and diagnostic studies,  and after discussing with Dr. Donzetta Matters, will schedule pt for soonest available right LE arterial duplex and see Dr. Trula Slade afterward.    I emphasized the importance of maximal medical management including strict control of blood pressure, blood glucose, and lipid levels, antiplatelet agents, obtaining regular exercise, and continued  cessation of smoking.     Thank you for allowing [place] to participate in this patient's care.  Clemon Chambers, RN, MSN, FNP-C [facility] [place] Office: [phone]  Clinic Physician: Donzetta Matters  03/14/2016, 11:16 AM

## 2016-03-17 ENCOUNTER — Other Ambulatory Visit: Payer: Self-pay | Admitting: *Deleted

## 2016-03-17 DIAGNOSIS — Z48812 Encounter for surgical aftercare following surgery on the circulatory system: Secondary | ICD-10-CM

## 2016-03-17 DIAGNOSIS — I739 Peripheral vascular disease, unspecified: Secondary | ICD-10-CM

## 2016-03-17 NOTE — Addendum Note (Signed)
Addended by: Lianne Cure A on: 03/17/2016 09:41 AM   Modules accepted: Orders

## 2016-03-18 ENCOUNTER — Ambulatory Visit (HOSPITAL_COMMUNITY)
Admission: RE | Admit: 2016-03-18 | Discharge: 2016-03-18 | Disposition: A | Discharge status: Home / Self Care | Payer: Medicare Other | Source: Ambulatory Visit | Attending: Vascular Surgery | Admitting: Vascular Surgery

## 2016-03-18 DIAGNOSIS — I714 Abdominal aortic aneurysm, without rupture, unspecified: Secondary | ICD-10-CM

## 2016-03-18 DIAGNOSIS — Z95828 Presence of other vascular implants and grafts: Secondary | ICD-10-CM | POA: Diagnosis not present

## 2016-03-18 DIAGNOSIS — Z87891 Personal history of nicotine dependence: Secondary | ICD-10-CM

## 2016-03-18 DIAGNOSIS — I739 Peripheral vascular disease, unspecified: Secondary | ICD-10-CM | POA: Diagnosis not present

## 2016-03-19 ENCOUNTER — Encounter: Payer: Self-pay | Admitting: Surgery

## 2016-03-24 ENCOUNTER — Ambulatory Visit (INDEPENDENT_AMBULATORY_CARE_PROVIDER_SITE_OTHER): Payer: Medicare Other | Admitting: Surgery

## 2016-03-24 ENCOUNTER — Encounter: Payer: Self-pay | Admitting: Surgery

## 2016-03-24 VITALS — BP 140/78 | HR 69 | Temp 98.4°F | Resp 18 | Ht 71.0 in | Wt 293.1 lb

## 2016-03-24 DIAGNOSIS — I714 Abdominal aortic aneurysm, without rupture, unspecified: Secondary | ICD-10-CM

## 2016-03-24 NOTE — Progress Notes (Signed)
[facility] [place]  Patient name: Mathew Padilla MRN: [id number] DOB: 05/18/1950 Sex: male  REASON FOR VISIT: follow up  HPI: Patient is a 65 y.o. year old male who presents for follow-up evaluation of AAA. He underwent Zenith aneurysm stent graft repair in 2007. The patient denies new abdominal or back pain. He has also undergone a left-to-right femoral-femoral bypass for limb occlusion of a Zenith graft. At the same time he had a right femoral to below-knee popliteal bypass with propaten. This was in December of 2012. He subsequently underwent a left above-knee to below-knee popliteal bypass with vein in March of 2013. This is chronically occluded. He reports no claudication symptoms. He does have some intermittent swelling of his lower extremities. He also complains of burning and stinging in both feet consistent with neuropathy. He has had minimal relief with Lyrica or Neurontin. The patient's atherosclerotic risk factors remain obesity, hypertension. These are all currently stable and followed by his primary care physician. He is currently undergoing evaluation for gastric bypass. He has failed lap banding in the past.   He has recently undergone multilevel back surgery which has significantly improved his neuropathy.  He was seen by home health and they were concerned about redness in his right foot.  He saw Vinnie Level, and is back for follow-up with me.  He did have a duplex that shows an occluded right femoral-popliteal bypass graft.  The patient does not have any open wounds or evidence of infection.  He does not have rest pain.   Past Medical History:  Diagnosis Date  . AAA (abdominal aortic aneurysm) ([place]) 2007   stent graft repair 12/07  . Arteriosclerotic cardiovascular disease (ASCVD)    a. MI in 1999;  b.  PTCA of PDA and 2nd marginal in 3/00;  c. 02/2012 Cath: LM nl, LAD 46m, LCX 31m, OM small, 70, RCA 99p/162m-->Med Rx;  d.  03/2014 MV: EF 32%, inflat infarct w/ mild peri-infarct isch towards apex (similar to 2013 MV); d. 03/2014 Echo: EF 45-50%, Gr 1 DD, mildly dil LA.  Marland Kitchen CAD (coronary artery disease)   . Cerebrovascular disease   . CHF (congestive heart failure) ([place])   . Cholelithiasis 11/28/2010  . Cough, persistent   . Diabetes mellitus without complication ([place])   . Diverticulitis 2006  . DVT (deep venous thrombosis) ([place])   . Elevated PSA 06/2011   a. s/p TURP 01/2014.  Marland Kitchen Gastroesophageal reflux disease    Hiatal hernia; previously treated with PPI  . Hypertension    Mild; controlled with a single agent    . Laryngeal carcinoma ([place]) 2010   resection and radiation therapy 2010-11  . Myocardial infarction   . Obesity    BMI 47; lap band surgery 2010  . 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 Sleep apnea    BiPAP mask utilized        2 yrs sleep study    dr Redmond Pulling  . Tobacco abuse, in remission    40 pack years; discontinued in 2000    Family History  Problem Relation Age of Onset  . Heart attack Father     deceased  . Heart failure Father   . Hyperlipidemia Father   . Hypertension Father   . Heart disease Father   . Lung cancer Mother     deceased   . Hypertension Mother   . Hyperlipidemia Mother   . Heart failure Mother   . Diabetes Mother   .  Cancer Sister     vaginal  . Diabetes Sister   . Colon cancer Neg Hx     SOCIAL HISTORY: Social History  Substance Use Topics  . Smoking status: Former Smoker    Packs/day: 2.00    Years: 30.00    Types: Cigarettes    Quit date: 11/26/1997  . Smokeless tobacco: Never Used     Comment: 40 pack years; quit 2000  . Alcohol use No    Allergies  Allergen Reactions  . Finasteride Other (See Comments)    Unable to void Unable to void Unable to void  . Other Rash    Beta-blockers Beta-blockers Beta-blockers  . Statins Hives and Rash  . Aspirin Other (See Comments)    Cannot take daily, just once in a while Cannot take  daily, just once in a while Other reaction(s): GI Upset (intolerance) Cannot take daily, just once in a while Daily dose upsets stomach    Current Outpatient Prescriptions  Medication Sig Dispense Refill  . acetaminophen (TYLENOL) 500 MG tablet Take 500 mg by mouth every 6 (six) hours as needed for mild pain.    Marland Kitchen aspirin EC 81 MG tablet Take 1 tablet (81 mg total) by mouth daily. 90 tablet 3  . baclofen (LIORESAL) 10 MG tablet Take 10 mg by mouth 3 (three) times daily.    . Cholecalciferol (VITAMIN D) 2000 units CAPS Take 2,000 Units by mouth once a week.     . Esomeprazole Magnesium (NEXIUM PO) Take 1 tablet by mouth daily as needed (heartburn).    . hydrALAZINE (APRESOLINE) 50 MG tablet Take 50 mg by mouth See admin instructions. Patient only takes when he has elevated blood pressure readings.    . irbesartan (AVAPRO) 150 MG tablet Take 1 tablet (150 mg total) by mouth daily. 30 tablet 11  . metFORMIN (GLUCOPHAGE) 500 MG tablet Take 500 mg by mouth as needed.    . Multiple Vitamin (MULTIVITAMIN WITH MINERALS) TABS tablet Take 1 tablet by mouth daily.    . nitroGLYCERIN (NITROSTAT) 0.4 MG SL tablet Place 1 tablet (0.4 mg total) under the tongue every 5 (five) minutes as needed for chest pain. 25 tablet 11  . oxyCODONE-acetaminophen (PERCOCET) 7.5-325 MG tablet Take 1-2 tablets by mouth every 4 (four) hours as needed for severe pain. 90 tablet 0  . potassium chloride SA (KLOR-CON M20) 20 MEQ tablet TAKE 1 TABLET (20 MEQ TOTAL) BY MOUTH DAILY. 30 tablet 6  . torsemide (DEMADEX) 20 MG tablet Take 20 mg by mouth daily.    Marland Kitchen zolpidem (AMBIEN) 5 MG tablet Take 5 mg by mouth at bedtime as needed for sleep.    Marland Kitchen gabapentin (NEURONTIN) 400 MG capsule Take 400 mg by mouth daily as needed (pain).    . Menthol, Topical Analgesic, (ICY HOT EX) Apply 1 application topically daily as needed (back pain).    . methocarbamol (ROBAXIN) 750 MG tablet Take 1 tablet (750 mg total) by mouth 4 (four) times daily.  (Patient not taking: Reported on 03/24/2016) 120 tablet 2  . pregabalin (LYRICA) 50 MG capsule Take 2 capsules (100 mg total) by mouth 2 (two) times daily. (Patient not taking: Reported on 03/24/2016) 120 capsule 0  . tapentadol 100 MG TABS Take 1 tablet (100 mg total) by mouth every 4 (four) hours as needed for severe pain. (Patient not taking: Reported on 03/24/2016) 80 tablet 0   No current facility-administered medications for this visit.     REVIEW OF SYSTEMS:  [  X] denotes positive finding, [ ]  denotes negative finding Cardiac  Comments:  Chest pain or chest pressure:    Shortness of breath upon exertion:    Short of breath when lying flat:    Irregular heart rhythm:        Vascular    Pain in calf, thigh, or hip brought on by ambulation: x   Pain in feet at night that wakes you up from your sleep:  x   Blood clot in your veins:    Leg swelling:  x       Pulmonary    Oxygen at home:    Productive cough:     Wheezing:         Neurologic    Sudden weakness in arms or legs:     Sudden numbness in arms or legs:     Sudden onset of difficulty speaking or slurred speech:    Temporary loss of vision in one eye:     Problems with dizziness:         Gastrointestinal    Blood in stool:     Vomited blood:         Genitourinary    Burning when urinating:     Blood in urine:        Psychiatric    Major depression:         Hematologic    Bleeding problems:    Problems with blood clotting too easily:        Skin    Rashes or ulcers:        Constitutional    Fever or chills:      PHYSICAL EXAM: Vitals:   03/24/16 1053  BP: 140/78  Pulse: 69  Resp: 18  Temp: 98.4 F (36.9 C)  TempSrc: Oral  SpO2: 96%  Weight: 293 lb 1.6 oz (132.9 kg)  Height: 5\' 11"  (1.803 m)    GENERAL: The patient is a well-nourished male, in no acute distress. The vital signs are documented above. CARDIAC: There is a regular rate and rhythm.  VASCULAR: Nonpalpable pedal pulses    PULMONARY: There is good air exchange bilaterally without wheezing or rales. MUSCULOSKELETAL: There are no major deformities or cyanosis. NEUROLOGIC: No focal weakness or paresthesias are detected. SKIN: There are no ulcers or rashes noted. PSYCHIATRIC: The patient has a normal affect.  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  His femoral-femoral graft is patent.  The patient's foot is back to normal.  He does not have any indication for revascularization at this time.  His symptoms remained stable.  He is scheduled for follow-up of his aneurysm with Dr. Oneida Alar next year.   Annamarie Major, MD [facility] Special[facility] of [facility] [phone] Pager [phone]

## 2016-03-25 NOTE — Therapy (Signed)
[facility] [facility], [place], [address] Phone: [phone]   Fax:  [phone]  Physical Therapy Evaluation  Patient Details  Name: Mathew Padilla MRN: [id number] Date of Birth: Aug 22, 1950 Referring Provider: Dr Marland Kitchen Ditty  Encounter Date: 01/28/2016    Past Medical History:  Diagnosis Date  . AAA (abdominal aortic aneurysm) ([place]) 2007   stent graft repair 12/07  . Arteriosclerotic cardiovascular disease (ASCVD)    a. MI in 1999;  b.  PTCA of PDA and 2nd marginal in 3/00;  c. 02/2012 Cath: LM nl, LAD 66m LCX 583mOM small, 70, RCA 99p/10056mMed Rx;  d. 03/2014 MV: EF 32%, inflat infarct w/ mild peri-infarct isch towards apex (similar to 2013 MV); d. 03/2014 Echo: EF 45-50%, Gr 1 DD, mildly dil LA.  . CMarland KitchenD (coronary artery disease)   . Cerebrovascular disease   . CHF (congestive heart failure) ([place] . Cholelithiasis 11/28/2010  . Cough, persistent   . Diabetes mellitus without complication ([place] . Diverticulitis 2006  . DVT (deep venous thrombosis) ([place] . Elevated PSA 06/2011   a. s/p TURP 01/2014.  . GMarland Kitchenstroesophageal reflux disease    Hiatal hernia; previously treated with PPI  . Hypertension    Mild; controlled with a single agent    . Laryngeal carcinoma ([facility]   resection and radiation therapy 2010-11  . Myocardial infarction   . Obesity    BMI 47; lap band surgery 2010  . Popliteal aneurysm ([place]  popliteal bypass-2013  . Sleep apnea    BiPAP mask utilized        2 yrs sleep study    dr wilRedmond Pulling Tobacco abuse, in remission    40 pack years; discontinued in 2000    Past Surgical History:  Procedure Laterality Date  . ANTERIOR LAT LUMBAR FUSION N/A 02/22/2016   Procedure: LUMBAR TWO-LUMBAR FIVE ANTERIOR LATERAL LUMBAR INTERBODY FUSION WITH PERCUTANEOUS PEDICLE SCREWS;  Surgeon: BenKevan Nytty, MD;  [facility];  Laterality: N/A;  Left side  approach  . CARDIAC CATHETERIZATION  2000   PCI-stent  . CATARACT EXTRACTION    . COLONOSCOPY  10/2007  . COLONOSCOPY WITH ESOPHAGOGASTRODUODENOSCOPY (EGD)  2009   RMR: He had distal esophageal erosions, patulous GE junction, erosive reflux esophagitis, hiatal hernia, lipoma on the angularis not manipulated, left-sided diverticula.  . CYSTOSCOPY  02/22/2016   Procedure: CYSTOSCOPY FLEXIBLE WITH URETHER DILATION  WITH CATHETER PLACEMENT;  Surgeon: TheAlexis FrockD;  [facility];;  . EYE SURGERY    . FEMORAL-FEMORAL BYPASS GRAFT  04/18/2011   Procedure: BYPASS GRAFT FEMORAL-FEMORAL ARTERY;  Surgeon: V WTheotis BurrowD;  Location[facility];  Service: Vascular;  Laterality: Bilateral;  Left to right femoral to femoral bypass   . FEMORAL-POPLITEAL BYPASS GRAFT  04/18/2011   Procedure: BYPASS GRAFT FEMORAL-POPLITEAL ARTERY;  Surgeon: V WTheotis BurrowD;  [facility];  Service: Vascular;  Laterality: Right;  Right femoral popliteal bypass with propaten gortex graft  . gastic sleeve  07/2015   gastic band removed and have sleeve  . LAPAROSCOPIC GASTRIC BANDING  2010  . LARYNX SURGERY  2010   Resection of carcinoma  . LEFT HEART CATHETERIZATION WITH CORONARY ANGIOGRAM N/A 03/10/2012   Procedure: LEFT HEART CATHETERIZATION WITH CORONARY ANGIOGRAM;  Surgeon: ChrBurnell BlanksD;  [facility];  Service: Cardiovascular;  Laterality: N/A;  . LUMBAR PERCUTANEOUS  PEDICLE SCREW 3 LEVEL N/A 02/22/2016   Procedure: LUMBAR PERCUTANEOUS PEDICLE SCREW LUMBAR TWO-LUMBAR FIVE;  Surgeon: Kevan Ny Ditty, MD;  Location: [facility];  Service: Neurosurgery;  Laterality: N/A;  . OPEN ANTERIOR SHOULDER RECONSTRUCTION  2011   Left  . PERIPHERAL ARTERIAL STENT GRAFT  2007   for abdominal aortic aneurysm  . PR VEIN BYPASS GRAFT,AORTO-FEM-POP  07-23-11   Left AK to BK popliteal BPG using rev. saphenous vein    There were no vitals filed for this  visit.                                   PT Long Term Goals - 01/28/16 1241      PT LONG TERM GOAL #1   Title Improve trunk and LE moblity and ROM 03/10/16   Time 6   Period Weeks   Status New     PT LONG TERM GOAL #2   Title Decrease pain allowing Mathew Padilla to sleep for 3-4 hours without awakending due to pain 03/10/16   Time 6   Period Weeks   Status New     PT LONG TERM GOAL #3   Title Decrease frequency; intenisty; duration of pain allwoing Mathew Padilla to increase functional activity level at home and at the [facility] 03/10/16   Time 6   Period Weeks   Status New     PT LONG TERM GOAL #4   Title Independent in HEP 03/10/16   Time 6   Period Weeks   Status New     PT LONG TERM GOAL #5   Title Improve FOTO to </= 42% limitation 03/10/16   Time 6   Period Weeks   Status New             Patient will benefit from skilled therapeutic intervention in order to improve the following deficits and impairments:  Postural dysfunction, Improper body mechanics, Pain, Decreased range of motion, Decreased mobility, Decreased activity tolerance  Visit Diagnosis: Chronic bilateral low back pain, with sciatica presence unspecified - Plan: PT plan of care cert/re-cert  Other symptoms and signs involving the musculoskeletal system - Plan: PT plan of care cert/re-cert     Problem List Patient Active Problem List   Diagnosis Date Noted  . Lumbosacral spondylosis with radiculopathy 02/22/2016  . Pulmonary nodules/lesions, multiple 11/05/2013  . Prostate enlargement 11/05/2013  . Infection of urinary tract 11/05/2013  . Pyelonephritis 11/03/2013  . Sepsis-unclear source 11/03/2013  . History of laparoscopic adjustable gastric banding, APL, 12/19/2008. 05/12/2012  . Cold feet 04/01/2012  . Chest pain 04/01/2012  . Atherosclerosis of native arteries of the extremities with intermittent claudication 11/06/2011  . Popliteal aneurysm ([place]) 08/07/2011  . S/P bypass  graft of extremity 08/07/2011  . Abdominal aneurysm without mention of rupture 07/17/2011  . Peripheral vascular disease, unspecified 05/12/2011  . Pulmonary infiltrate 12/02/2010  . Cholelithiasis 11/28/2010  . Low back pain 11/25/2010  . Pneumonia 11/25/2010  . Arteriosclerotic cardiovascular disease (ASCVD)   . Laryngeal carcinoma ([place])   . AAA (abdominal aortic aneurysm) ([place])   . Diverticulitis   . Obesity   . Gastroesophageal reflux disease   . Sleep apnea   . Tobacco abuse, in remission   . Cerebrovascular disease   . Essential hypertension 11/21/2009    Mathew Padilla 03/25/2016, 10:26 AM  [facility] [place], [place], [address] Phone: [phone]  Fax:  [phone]  Name: Mathew Padilla MRN: [id number] Date of Birth: 1951/03/22  PHYSICAL THERAPY DISCHARGE SUMMARY  Visits from Start of Care: Eval only  Current functional level related to goals / functional outcomes: Unchanged - eval only   Remaining deficits: unchanged   Education / Equipment: HEP Plan: Patient agrees to discharge.  Patient goals were not met. Patient is being discharged due to not returning since the last visit.  ?????    Patient underwent surgery. Did not return for further therapy.  Mathew Padilla P. Helene Kelp PT, MPH 03/25/16 10:28 AM

## 2016-05-21 DIAGNOSIS — M4727 Other spondylosis with radiculopathy, lumbosacral region: Secondary | ICD-10-CM | POA: Diagnosis not present

## 2016-05-21 DIAGNOSIS — I10 Essential (primary) hypertension: Secondary | ICD-10-CM | POA: Diagnosis not present

## 2016-05-28 DIAGNOSIS — M4727 Other spondylosis with radiculopathy, lumbosacral region: Secondary | ICD-10-CM | POA: Diagnosis not present

## 2016-06-05 DIAGNOSIS — K909 Intestinal malabsorption, unspecified: Secondary | ICD-10-CM | POA: Diagnosis not present

## 2016-06-19 DIAGNOSIS — M4727 Other spondylosis with radiculopathy, lumbosacral region: Secondary | ICD-10-CM | POA: Diagnosis not present

## 2016-06-19 DIAGNOSIS — I10 Essential (primary) hypertension: Secondary | ICD-10-CM | POA: Diagnosis not present

## 2016-06-20 DIAGNOSIS — E119 Type 2 diabetes mellitus without complications: Secondary | ICD-10-CM | POA: Diagnosis not present

## 2016-06-20 DIAGNOSIS — Z9884 Bariatric surgery status: Secondary | ICD-10-CM | POA: Diagnosis not present

## 2016-06-20 DIAGNOSIS — I10 Essential (primary) hypertension: Secondary | ICD-10-CM | POA: Diagnosis not present

## 2016-06-20 DIAGNOSIS — K909 Intestinal malabsorption, unspecified: Secondary | ICD-10-CM | POA: Diagnosis not present

## 2016-06-25 DIAGNOSIS — M4727 Other spondylosis with radiculopathy, lumbosacral region: Secondary | ICD-10-CM | POA: Diagnosis not present

## 2016-07-04 ENCOUNTER — Encounter: Payer: Self-pay | Admitting: Vascular Surgery

## 2016-07-07 ENCOUNTER — Inpatient Hospital Stay: Admission: RE | Admit: 2016-07-07 | Payer: Medicare Other | Source: Ambulatory Visit

## 2016-07-15 ENCOUNTER — Ambulatory Visit
Admission: RE | Admit: 2016-07-15 | Discharge: 2016-07-15 | Disposition: A | Discharge status: Home / Self Care | Payer: Medicare Other | Source: Ambulatory Visit | Attending: Family | Admitting: Family

## 2016-07-15 DIAGNOSIS — Z95828 Presence of other vascular implants and grafts: Secondary | ICD-10-CM

## 2016-07-15 DIAGNOSIS — I739 Peripheral vascular disease, unspecified: Secondary | ICD-10-CM

## 2016-07-15 DIAGNOSIS — I714 Abdominal aortic aneurysm, without rupture, unspecified: Secondary | ICD-10-CM

## 2016-07-15 DIAGNOSIS — Z87891 Personal history of nicotine dependence: Secondary | ICD-10-CM

## 2016-07-15 MED ORDER — IOPAMIDOL (ISOVUE-370) INJECTION 76%
75.0000 mL | Freq: Once | INTRAVENOUS | Status: AC | PRN
  Administered 2016-07-15: 75 mL via INTRAVENOUS

## 2016-07-15 MED ORDER — IOPAMIDOL (ISOVUE-370) INJECTION 76%
100.0000 mL | Freq: Once | INTRAVENOUS | Status: AC | PRN
  Administered 2016-07-15: 100 mL via INTRAVENOUS

## 2016-07-16 ENCOUNTER — Other Ambulatory Visit: Payer: Medicare Other

## 2016-07-17 ENCOUNTER — Encounter: Payer: Self-pay | Admitting: Vascular Surgery

## 2016-07-17 ENCOUNTER — Ambulatory Visit (INDEPENDENT_AMBULATORY_CARE_PROVIDER_SITE_OTHER): Payer: Medicare Other | Admitting: Vascular Surgery

## 2016-07-17 VITALS — BP 142/80 | HR 53 | Temp 99.0°F | Resp 16 | Ht 71.0 in | Wt 281.0 lb

## 2016-07-17 DIAGNOSIS — I10 Essential (primary) hypertension: Secondary | ICD-10-CM | POA: Diagnosis not present

## 2016-07-17 DIAGNOSIS — I739 Peripheral vascular disease, unspecified: Secondary | ICD-10-CM | POA: Diagnosis not present

## 2016-07-17 DIAGNOSIS — I714 Abdominal aortic aneurysm, without rupture, unspecified: Secondary | ICD-10-CM

## 2016-07-17 DIAGNOSIS — M4727 Other spondylosis with radiculopathy, lumbosacral region: Secondary | ICD-10-CM | POA: Diagnosis not present

## 2016-07-17 NOTE — Progress Notes (Signed)
History of Present Illness:   patient is a 66 year old male who presents for follow-up evaluation of abdominal aortic aneurysm repair and prior lower external leg bypass grafts. He underwent an seen aneurysm stent graft repair in 2007. Subsequently he underwent a left to right femoral-femoral bypass for limb occlusion several years after that. He also had a right femoral to above-knee popliteal bypass with prosthetic at the time of his femoral-femoral bypass. This was all in 2012. He subsequently underwent a left femoral to above-knee popliteal bypass in March 2013. Both of his lower extremity bypass grafts of been chronically occluded. He has no claudication symptoms. He has no nonhealing wounds of his foot. He does have some light duskiness of his right first toe.  He had several complaints of neuropathy on his last office visit but states that these have improved after spine surgery earlier this year. He essentially has no neuropathy symptoms at this point.  He also underwent a gastric sleeve operation and has lost 75 pounds.  Review of systems: He denies shortness of breath and is able to exercise for greater than an hour and walks several miles per day. He denies chest pain.  Past Medical History:  Diagnosis Date  . AAA (abdominal aortic aneurysm) ([place]) 2007   stent graft repair 12/07  . Arteriosclerotic cardiovascular disease (ASCVD)    a. MI in 1999;  b.  PTCA of PDA and 2nd marginal in 3/00;  c. 02/2012 Cath: LM nl, LAD 33m, LCX 29m, OM small, 70, RCA 99p/169m-->Med Rx;  d. 03/2014 MV: EF 32%, inflat infarct w/ mild peri-infarct isch towards apex (similar to 2013 MV); d. 03/2014 Echo: EF 45-50%, Gr 1 DD, mildly dil LA.  Marland Kitchen CAD (coronary artery disease)   . Cerebrovascular disease   . CHF (congestive heart failure) ([place])   . Cholelithiasis 11/28/2010  . Cough, persistent   . Diabetes mellitus without complication ([place])   . Diverticulitis 2006  . DVT (deep venous thrombosis) ([place])   .  Elevated PSA 06/2011   a. s/p TURP 01/2014.  Marland Kitchen Gastroesophageal reflux disease    Hiatal hernia; previously treated with PPI  . Hypertension    Mild; controlled with a single agent    . Laryngeal carcinoma ([place]) 2010   resection and radiation therapy 2010-11  . Myocardial infarction   . Obesity    BMI 47; lap band surgery 2010  . Popliteal aneurysm ([place])    popliteal bypass-2013  . Sleep apnea    BiPAP mask utilized        2 yrs sleep study    dr Redmond Pulling  . Tobacco abuse, in remission    40 pack years; discontinued in 2000     Physical exam:  Vitals:   07/17/16 1120  BP: (!) 142/80  Pulse: (!) 53  Resp: 16  Temp: 99 F (37.2 C)  TempSrc: Oral  SpO2: 96%  Weight: 281 lb (127.5 kg)  Height: 5\' 11"  (1.803 m)    Extremities: Right first toe slightly dusky nonpalpable pedal pulses bilaterally feet pink and warm otherwise.  Abdomen: Obese soft nontender nondistended no pulsatile mass femorofemoral difficult to palpate  Neck: No carotid bruits new. Chest: Clear to auscultation bilaterally  Cardiac: Regular rate and rhythm  Data: Patient had a recent CT scan of the abdomen and pelvis which I reviewed the images of his aneurysm stent graft in good position the aneurysm has completely shrunk down around the stent graft. The femoral-femoral bypass is patent.  Assessment:  #  1 abdominal aortic aneurysm well sealed with a Zenith aneurysm stent graft  #2 peripheral arterial disease bilateral femoropopliteal occlusions chronic in nature asymptomatic  #3 morbid obesity good weight loss with recent gastric sleeve  #4 chronic back pain with neuropathy improved status post recent spine surgery  #5 other chronic medical problems of sleep apnea hypertension diabetes are all currently stable.  Plan: Patient will have a follow-up ultrasound of his abdominal aortic aneurysm and bilateral ABIs in 1 year and see our nurse practitioner that office visit.  Ruta Hinds, MD [facility] and  [facility] [place] Office: [phone] Pager: [phone]

## 2016-07-23 NOTE — Addendum Note (Signed)
Addended by: Lianne Cure A on: 07/23/2016 09:59 AM   Modules accepted: Orders

## 2016-07-26 DIAGNOSIS — M4727 Other spondylosis with radiculopathy, lumbosacral region: Secondary | ICD-10-CM | POA: Diagnosis not present

## 2016-08-21 ENCOUNTER — Other Ambulatory Visit: Payer: Self-pay | Admitting: Cardiovascular Disease

## 2016-08-22 ENCOUNTER — Telehealth: Payer: Self-pay | Admitting: Cardiovascular Disease

## 2016-08-22 DIAGNOSIS — Z0289 Encounter for other administrative examinations: Secondary | ICD-10-CM

## 2016-08-22 DIAGNOSIS — I2510 Atherosclerotic heart disease of native coronary artery without angina pectoris: Secondary | ICD-10-CM

## 2016-08-22 NOTE — Telephone Encounter (Signed)
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 [phone] fax to Dr. Jomarie Longs

## 2016-08-22 NOTE — Telephone Encounter (Signed)
Will forward to medical records 

## 2016-08-25 DIAGNOSIS — M4727 Other spondylosis with radiculopathy, lumbosacral region: Secondary | ICD-10-CM | POA: Diagnosis not present

## 2016-08-25 MED ORDER — IRBESARTAN 150 MG PO TABS: 150.0000 mg | ORAL_TABLET | Freq: Every day | ORAL | 6 refills | Status: DC

## 2016-08-25 NOTE — Telephone Encounter (Signed)
I spoke with pt and told him GXT could be done at [facility]. I verbally went over all instructions with him.  I told him [place] office will contact him with appointment information. Pt requests refill of Irbesartan sent to [facility] in [place].  He used to have filled in [place] but is now changing to [facility] in [place].  Will send in.

## 2016-08-25 NOTE — Telephone Encounter (Signed)
Follow up  Pts wife voiced needing to speak with nurse.

## 2016-08-25 NOTE — Telephone Encounter (Signed)
I spoke with pt. He states he needs a treadmill cardiac stress test with Bruce protocol.  Will forward to Dr. Angelena Form to see if OK to order. Pt would like done in [place] if possible.

## 2016-08-25 NOTE — Telephone Encounter (Signed)
I spoke with pt. 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

## 2016-08-25 NOTE — Telephone Encounter (Signed)
OK with me thanks

## 2016-08-25 NOTE — Telephone Encounter (Signed)
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 He want to talk to the nurse

## 2016-08-26 ENCOUNTER — Encounter: Payer: Self-pay | Admitting: *Deleted

## 2016-09-01 ENCOUNTER — Ambulatory Visit (HOSPITAL_COMMUNITY)
Admission: RE | Admit: 2016-09-01 | Discharge: 2016-09-01 | Disposition: A | Discharge status: Home / Self Care | Payer: Medicare Other | Source: Ambulatory Visit | Attending: Cardiovascular Disease | Admitting: Cardiovascular Disease

## 2016-09-01 DIAGNOSIS — Z0289 Encounter for other administrative examinations: Secondary | ICD-10-CM | POA: Diagnosis not present

## 2016-09-01 DIAGNOSIS — I493 Ventricular premature depolarization: Secondary | ICD-10-CM | POA: Insufficient documentation

## 2016-09-01 DIAGNOSIS — I472 Ventricular tachycardia: Secondary | ICD-10-CM | POA: Insufficient documentation

## 2016-09-01 DIAGNOSIS — I2510 Atherosclerotic heart disease of native coronary artery without angina pectoris: Secondary | ICD-10-CM | POA: Insufficient documentation

## 2016-09-01 LAB — EXERCISE TOLERANCE TEST
CHL CUP RESTING HR STRESS: 85 {beats}/min
CSEPEDS: 24 s
CSEPPHR: 155 {beats}/min
Estimated workload: 4.6 METS
Exercise duration (min): 3 min
MPHR: 154 {beats}/min
Percent HR: 100 %
RPE: 15

## 2016-09-04 ENCOUNTER — Encounter: Payer: Self-pay | Admitting: Cardiovascular Disease

## 2016-09-04 NOTE — Telephone Encounter (Signed)
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  I spoke with pt and let him know letter has been written.  Pt requests this be sent to his home address listed in chart.  Letter sent to pt.

## 2016-09-25 DIAGNOSIS — M4727 Other spondylosis with radiculopathy, lumbosacral region: Secondary | ICD-10-CM | POA: Diagnosis not present

## 2016-10-10 DIAGNOSIS — I10 Essential (primary) hypertension: Secondary | ICD-10-CM | POA: Diagnosis not present

## 2016-10-10 DIAGNOSIS — M549 Dorsalgia, unspecified: Secondary | ICD-10-CM | POA: Diagnosis not present

## 2016-10-10 DIAGNOSIS — M4727 Other spondylosis with radiculopathy, lumbosacral region: Secondary | ICD-10-CM | POA: Diagnosis not present

## 2016-10-25 DIAGNOSIS — M4727 Other spondylosis with radiculopathy, lumbosacral region: Secondary | ICD-10-CM | POA: Diagnosis not present

## 2017-01-27 DIAGNOSIS — M5416 Radiculopathy, lumbar region: Secondary | ICD-10-CM | POA: Diagnosis not present

## 2017-02-03 DIAGNOSIS — M5416 Radiculopathy, lumbar region: Secondary | ICD-10-CM | POA: Diagnosis not present

## 2017-02-22 ENCOUNTER — Other Ambulatory Visit: Payer: Self-pay | Admitting: Cardiovascular Disease

## 2017-03-16 DIAGNOSIS — M5416 Radiculopathy, lumbar region: Secondary | ICD-10-CM | POA: Diagnosis not present

## 2017-04-01 DIAGNOSIS — I10 Essential (primary) hypertension: Secondary | ICD-10-CM | POA: Diagnosis not present

## 2017-04-01 DIAGNOSIS — M5416 Radiculopathy, lumbar region: Secondary | ICD-10-CM | POA: Diagnosis not present

## 2017-04-08 DIAGNOSIS — M5416 Radiculopathy, lumbar region: Secondary | ICD-10-CM | POA: Diagnosis not present

## 2017-04-08 DIAGNOSIS — M4727 Other spondylosis with radiculopathy, lumbosacral region: Secondary | ICD-10-CM | POA: Diagnosis not present

## 2017-04-08 DIAGNOSIS — M4722 Other spondylosis with radiculopathy, cervical region: Secondary | ICD-10-CM | POA: Diagnosis not present

## 2017-04-09 ENCOUNTER — Other Ambulatory Visit: Payer: Self-pay | Admitting: Neurological Surgery

## 2017-04-09 DIAGNOSIS — K210 Gastro-esophageal reflux disease with esophagitis: Secondary | ICD-10-CM | POA: Diagnosis not present

## 2017-04-09 DIAGNOSIS — E1140 Type 2 diabetes mellitus with diabetic neuropathy, unspecified: Secondary | ICD-10-CM | POA: Diagnosis not present

## 2017-04-09 DIAGNOSIS — I10 Essential (primary) hypertension: Secondary | ICD-10-CM | POA: Diagnosis not present

## 2017-04-09 DIAGNOSIS — I2510 Atherosclerotic heart disease of native coronary artery without angina pectoris: Secondary | ICD-10-CM | POA: Diagnosis not present

## 2017-04-09 DIAGNOSIS — M4722 Other spondylosis with radiculopathy, cervical region: Secondary | ICD-10-CM

## 2017-04-13 ENCOUNTER — Ambulatory Visit (INDEPENDENT_AMBULATORY_CARE_PROVIDER_SITE_OTHER): Payer: Medicare Other | Admitting: Otolaryngology

## 2017-04-20 ENCOUNTER — Other Ambulatory Visit: Payer: Medicare Other

## 2017-04-20 ENCOUNTER — Ambulatory Visit
Admission: RE | Admit: 2017-04-20 | Discharge: 2017-04-20 | Disposition: A | Discharge status: Home / Self Care | Payer: Medicare Other | Source: Ambulatory Visit | Attending: Neurological Surgery | Admitting: Neurological Surgery

## 2017-04-20 DIAGNOSIS — M4722 Other spondylosis with radiculopathy, cervical region: Secondary | ICD-10-CM

## 2017-04-20 DIAGNOSIS — M4802 Spinal stenosis, cervical region: Secondary | ICD-10-CM | POA: Diagnosis not present

## 2017-04-23 DIAGNOSIS — R82998 Other abnormal findings in urine: Secondary | ICD-10-CM | POA: Diagnosis not present

## 2017-04-23 DIAGNOSIS — R972 Elevated prostate specific antigen [PSA]: Secondary | ICD-10-CM | POA: Diagnosis not present

## 2017-04-23 DIAGNOSIS — N453 Epididymo-orchitis: Secondary | ICD-10-CM | POA: Diagnosis not present

## 2017-04-23 DIAGNOSIS — R399 Unspecified symptoms and signs involving the genitourinary system: Secondary | ICD-10-CM | POA: Diagnosis not present

## 2017-04-26 DIAGNOSIS — Z8521 Personal history of malignant neoplasm of larynx: Secondary | ICD-10-CM | POA: Diagnosis not present

## 2017-04-26 DIAGNOSIS — Z8639 Personal history of other endocrine, nutritional and metabolic disease: Secondary | ICD-10-CM | POA: Diagnosis not present

## 2017-04-26 DIAGNOSIS — Z8709 Personal history of other diseases of the respiratory system: Secondary | ICD-10-CM | POA: Diagnosis not present

## 2017-04-26 DIAGNOSIS — Z87448 Personal history of other diseases of urinary system: Secondary | ICD-10-CM | POA: Diagnosis not present

## 2017-04-26 DIAGNOSIS — R310 Gross hematuria: Secondary | ICD-10-CM | POA: Diagnosis not present

## 2017-04-26 DIAGNOSIS — Z9889 Other specified postprocedural states: Secondary | ICD-10-CM | POA: Diagnosis not present

## 2017-04-26 DIAGNOSIS — Z8679 Personal history of other diseases of the circulatory system: Secondary | ICD-10-CM | POA: Diagnosis not present

## 2017-06-25 DIAGNOSIS — M25511 Pain in right shoulder: Secondary | ICD-10-CM | POA: Diagnosis not present

## 2017-06-25 DIAGNOSIS — M4722 Other spondylosis with radiculopathy, cervical region: Secondary | ICD-10-CM | POA: Diagnosis not present

## 2017-06-25 DIAGNOSIS — I10 Essential (primary) hypertension: Secondary | ICD-10-CM | POA: Diagnosis not present

## 2017-07-16 ENCOUNTER — Encounter (HOSPITAL_COMMUNITY): Payer: Medicare Other

## 2017-07-16 ENCOUNTER — Ambulatory Visit (INDEPENDENT_AMBULATORY_CARE_PROVIDER_SITE_OTHER): Payer: Medicare Other

## 2017-07-16 ENCOUNTER — Ambulatory Visit: Payer: Medicare Other | Admitting: Family

## 2017-07-16 ENCOUNTER — Encounter (INDEPENDENT_AMBULATORY_CARE_PROVIDER_SITE_OTHER): Payer: Self-pay | Admitting: Orthopaedic Surgery

## 2017-07-16 ENCOUNTER — Ambulatory Visit (INDEPENDENT_AMBULATORY_CARE_PROVIDER_SITE_OTHER): Payer: Medicare Other | Admitting: Orthopaedic Surgery

## 2017-07-16 ENCOUNTER — Other Ambulatory Visit (HOSPITAL_COMMUNITY): Payer: Medicare Other

## 2017-07-16 VITALS — BP 152/95 | HR 67 | Ht 71.0 in | Wt 280.0 lb

## 2017-07-16 DIAGNOSIS — M25511 Pain in right shoulder: Secondary | ICD-10-CM

## 2017-07-17 ENCOUNTER — Encounter (INDEPENDENT_AMBULATORY_CARE_PROVIDER_SITE_OTHER): Payer: Self-pay | Admitting: Orthopaedic Surgery

## 2017-07-17 NOTE — Progress Notes (Signed)
Office Visit Note   Patient: Mathew Padilla           Date of Birth: [birth year]           MRN: [id number] Visit Date: 07/16/2017              Requested by: Sinda Du, [place] [place], [place] [address] PCP: Sinda Du, MD   Assessment & Plan: Visit Diagnoses:  1. Acute pain of right shoulder     Plan: Patient has high riding head consistent with complete rotator cuff tear likely long-standing.  He has already had subacromial injection without relief.  He has significant pain and I would recommend proceeding with an MRI scan to evaluate his shoulder for likely complete rotator cuff tear.  We discussed options for treatment.  We discussed the potential for significant rotator cuff atrophy and possible significant retraction.  Office follow-up after MRI scan for review.  Follow-Up Instructions: No follow-ups on file.   Orders:  Orders Placed This Encounter  Procedures  . XR Shoulder Right  . MR SHOULDER RIGHT WO CONTRAST   No orders of the defined types were placed in this encounter.     Procedures: No procedures performed   Clinical Data: No additional findings.   Subjective: Chief Complaint  Patient presents with  . Right Shoulder - Pain    HPI 67 year old male seen with right shoulder pain, insidious onset with gradual progression with difficulty getting his arm up over his head.  He is had pain with outstretched reaching and overhead activity.  Here with his wife who gives additional history.  Currently is retired.  He is followed by Dr. Sinda Du and [place], [place].  Patient denies associated neck pain no numbness or tingling in his hands.  He saw Dr. Trenton Gammon 2 weeks ago and had an injection done by his PA.  Previous lumbar fusion by Dr. Cyndy Freeze.  3-4 weeks great difficulty moving it getting dressed.Marland Kitchen  He does have a history of cervical spondylosis multilevel disc degeneration with previous C5-6 solid fusion.  Mild  stenosis at C6-7 facet degeneration C3-4.  Patient denies significant pain with neck rotation.  Review of Systems 14 point review of system positive for acid reflux, laryngeal cancer, cataracts, DVT, heart disease, hypertension, pneumonia and sleep apnea.  Previous lumbar and cervical fusions.  Tobacco abuse previous CVA, low back pain, previous AAA stent 2012.  Otherwise negative as it pertains HPI.   Objective: Vital Signs: BP (!) 152/95   Pulse 67   Ht 5\' 11"  (1.803 m)   Wt 280 lb (127 kg)   BMI 39.05 kg/m   Physical Exam  Constitutional: He is oriented to person, place, and time. He appears well-developed and well-nourished.  HENT:  Head: Normocephalic and atraumatic.  Eyes: Pupils are equal, round, and reactive to light. EOM are normal.  Neck: No tracheal deviation present. No thyromegaly present.  Cardiovascular: Normal rate.  Pulmonary/Chest: Effort normal. He has no wheezes.  Abdominal: Soft. Bowel sounds are normal.  Neurological: He is alert and oriented to person, place, and time.  Skin: Skin is warm and dry. Capillary refill takes less than 2 seconds.  Psychiatric: He has a normal mood and affect. His behavior is normal. Judgment and thought content normal.    Ortho Exam healed lumbar and cervical incisions.  No brachial plexus tenderness.  Positive drop arm test on the right.  Positive impingement test.  Long head of the biceps is  tender anteriorly.  Mild subscap weakness.  Good external rotation.  No distal migration of the biceps muscle.  Elbow range of motion is full.  Sensation of the hand is intact.  Specialty Comments:  No specialty comments available.  Imaging: Xr Shoulder Right  Result Date: 07/16/2017 2 view x-rays right shoulder obtained and reviewed.  This shows high riding humeral head abutting the acromium was subacromial spur formation.  Mild acromioclavicular degenerative changes. Impression: High riding humeral head suggestive of rotator cuff tear  supraspinatus.  Mild sclerosis at the greater tuberosity.  Negative for acute fracture.    PMFS History: Patient Active Problem List   Diagnosis Date Noted  . Lumbosacral spondylosis with radiculopathy 02/22/2016  . Pulmonary nodules/lesions, multiple 11/05/2013  . Prostate enlargement 11/05/2013  . Infection of urinary tract 11/05/2013  . Pyelonephritis 11/03/2013  . Sepsis-unclear source 11/03/2013  . History of laparoscopic adjustable gastric banding, APL, 12/19/2008. 05/12/2012  . Cold feet 04/01/2012  . Chest pain 04/01/2012  . Atherosclerosis of native arteries of the extremities with intermittent claudication 11/06/2011  . 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 S/P bypass graft of extremity 08/07/2011  . Abdominal aneurysm without mention of rupture 07/17/2011  . Peripheral vascular disease, unspecified ([place]) 05/12/2011  . Pulmonary infiltrate 12/02/2010  . Cholelithiasis 11/28/2010  . Low back pain 11/25/2010  . Pneumonia 11/25/2010  . Arteriosclerotic cardiovascular disease (ASCVD)   . Laryngeal carcinoma ([place])   . AAA (abdominal aortic aneurysm) ([place])   . Diverticulitis   . Obesity   . Gastroesophageal reflux disease   . Sleep apnea   . Tobacco abuse, in remission   . Cerebrovascular disease   . Essential hypertension 11/21/2009   Past Medical History:  Diagnosis Date  . AAA (abdominal aortic aneurysm) ([place]) 2007   stent graft repair 12/07  . Arteriosclerotic cardiovascular disease (ASCVD)    a. MI in 1999;  b.  PTCA of PDA and 2nd marginal in 3/00;  c. 02/2012 Cath: LM nl, LAD 81m, LCX 77m, OM small, 70, RCA 99p/153m-->Med Rx;  d. 03/2014 MV: EF 32%, inflat infarct w/ mild peri-infarct isch towards apex (similar to 2013 MV); d. 03/2014 Echo: EF 45-50%, Gr 1 DD, mildly dil LA.  Marland Kitchen CAD (coronary artery disease)   . Cerebrovascular disease   . CHF (congestive heart failure) ([place])   . Cholelithiasis 11/28/2010  . Cough, persistent   . Diabetes mellitus without  complication ([facility])   . Diverticulitis 2006  . DVT (deep venous thrombosis) ([facility])   . Elevated PSA 06/2011   a. s/p TURP 01/2014.  Marland Kitchen Gastroesophageal reflux disease    Hiatal hernia; previously treated with PPI  . Hypertension    Mild; controlled with a single agent    . Laryngeal carcinoma ([place]) 2010   resection and radiation therapy 2010-11  . Myocardial infarction ([place])   . Obesity    BMI 47; lap band surgery 2010  . 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 Sleep apnea    BiPAP mask utilized        2 yrs sleep study    dr Redmond Pulling  . Tobacco abuse, in remission    40 pack years; discontinued in 2000    Family History  Problem Relation Age of Onset  . Heart attack Father        deceased  . Heart failure Father   . Hyperlipidemia Father   . Hypertension Father   . Heart disease Father   . Lung  cancer Mother        deceased   . Hypertension Mother   . Hyperlipidemia Mother   . Heart failure Mother   . Diabetes Mother   . Cancer Sister        vaginal  . Diabetes Sister   . Colon cancer Neg Hx     Past Surgical History:  Procedure Laterality Date  . ANTERIOR LAT LUMBAR FUSION N/A 02/22/2016   Procedure: LUMBAR TWO-LUMBAR FIVE ANTERIOR LATERAL LUMBAR INTERBODY FUSION WITH PERCUTANEOUS PEDICLE SCREWS;  Surgeon: Kevan Ny Ditty, MD;  Location: [facility];  Service: Neurosurgery;  Laterality: N/A;  Left side approach  . CARDIAC CATHETERIZATION  2000   PCI-stent  . CATARACT EXTRACTION    . COLONOSCOPY  10/2007  . COLONOSCOPY WITH ESOPHAGOGASTRODUODENOSCOPY (EGD)  2009   RMR: He had distal esophageal erosions, patulous GE junction, erosive reflux esophagitis, hiatal hernia, lipoma on the angularis not manipulated, left-sided diverticula.  . CYSTOSCOPY  02/22/2016   Procedure: CYSTOSCOPY FLEXIBLE WITH URETHER DILATION  WITH CATHETER PLACEMENT;  Surgeon: Alexis Frock, MD;  Location: [facility];  Service: Urology;;  . EYE SURGERY    . FEMORAL-FEMORAL BYPASS GRAFT   04/18/2011   Procedure: BYPASS GRAFT FEMORAL-FEMORAL ARTERY;  Surgeon: Theotis Burrow, MD;  Location: [facility];  Service: Vascular;  Laterality: Bilateral;  Left to right femoral to femoral bypass   . FEMORAL-POPLITEAL BYPASS GRAFT  04/18/2011   Procedure: BYPASS GRAFT FEMORAL-POPLITEAL ARTERY;  Surgeon: Theotis Burrow, MD;  Location: [facility] OR;  Service: Vascular;  Laterality: Right;  Right femoral popliteal bypass with propaten gortex graft  . gastic sleeve  07/2015   gastic band removed and have sleeve  . LAPAROSCOPIC GASTRIC BANDING  2010  . LARYNX SURGERY  2010   Resection of carcinoma  . LEFT HEART CATHETERIZATION WITH CORONARY ANGIOGRAM N/A 03/10/2012   Procedure: LEFT HEART CATHETERIZATION WITH CORONARY ANGIOGRAM;  Surgeon: Burnell Blanks, MD;  Location[facility] CATH LAB;  Service: Cardiovascular;  Laterality: N/A;  . LUMBAR PERCUTANEOUS PEDICLE SCREW 3 LEVEL N/A 02/22/2016   Procedure: LUMBAR PERCUTANEOUS PEDICLE SCREW LUMBAR TWO-LUMBAR FIVE;  Surgeon: Kevan Ny Ditty, MD;  Location: [facility];  Service: Neurosurgery;  Laterality: N/A;  . OPEN ANTERIOR SHOULDER RECONSTRUCTION  2011   Left  . PERIPHERAL ARTERIAL STENT GRAFT  2007   for abdominal aortic aneurysm  . PR VEIN BYPASS GRAFT,AORTO-FEM-POP  07-23-11   Left AK to BK popliteal BPG using rev. saphenous vein   Social History   Occupational History  . Occupation: Retired, part Doctor, general practice  Tobacco Use  . Smoking status: Former Smoker    Packs/day: 2.00    Years: 30.00    Pack years: 60.00    Types: Cigarettes    Last attempt to quit: 11/26/1997    Years since quitting: 19.6  . Smokeless tobacco: Never Used  . Tobacco comment: 40 pack years; quit 2000  Substance and Sexual Activity  . Alcohol use: No    Alcohol/week: 0.0 oz  . Drug use: No  . Sexual activity: Yes

## 2017-07-24 ENCOUNTER — Other Ambulatory Visit: Payer: Medicare Other

## 2017-07-24 ENCOUNTER — Ambulatory Visit
Admission: RE | Admit: 2017-07-24 | Discharge: 2017-07-24 | Disposition: A | Discharge status: Home / Self Care | Payer: Medicare Other | Source: Ambulatory Visit | Attending: Orthopaedic Surgery | Admitting: Orthopaedic Surgery

## 2017-07-24 DIAGNOSIS — M25511 Pain in right shoulder: Secondary | ICD-10-CM

## 2017-07-30 ENCOUNTER — Encounter: Payer: Self-pay | Admitting: Family

## 2017-07-30 ENCOUNTER — Ambulatory Visit: Payer: Medicare Other | Admitting: Family

## 2017-07-30 ENCOUNTER — Ambulatory Visit (HOSPITAL_COMMUNITY)
Admission: RE | Admit: 2017-07-30 | Discharge: 2017-07-30 | Disposition: A | Discharge status: Home / Self Care | Payer: Medicare Other | Source: Ambulatory Visit | Attending: Family | Admitting: Family

## 2017-07-30 ENCOUNTER — Other Ambulatory Visit: Payer: Self-pay

## 2017-07-30 ENCOUNTER — Ambulatory Visit (INDEPENDENT_AMBULATORY_CARE_PROVIDER_SITE_OTHER)
Admission: RE | Admit: 2017-07-30 | Discharge: 2017-07-30 | Disposition: A | Discharge status: Home / Self Care | Payer: Medicare Other | Source: Ambulatory Visit | Attending: Family | Admitting: Family

## 2017-07-30 VITALS — BP 133/78 | HR 69 | Temp 97.2°F | Resp 18 | Ht 71.0 in | Wt 276.0 lb

## 2017-07-30 DIAGNOSIS — Z87891 Personal history of nicotine dependence: Secondary | ICD-10-CM

## 2017-07-30 DIAGNOSIS — I714 Abdominal aortic aneurysm, without rupture, unspecified: Secondary | ICD-10-CM

## 2017-07-30 DIAGNOSIS — I779 Disorder of arteries and arterioles, unspecified: Secondary | ICD-10-CM

## 2017-07-30 DIAGNOSIS — T82898S Other specified complication of vascular prosthetic devices, implants and grafts, sequela: Secondary | ICD-10-CM

## 2017-07-30 DIAGNOSIS — I739 Peripheral vascular disease, unspecified: Secondary | ICD-10-CM | POA: Insufficient documentation

## 2017-07-30 DIAGNOSIS — I7789 Other specified disorders of arteries and arterioles: Secondary | ICD-10-CM | POA: Diagnosis not present

## 2017-07-30 DIAGNOSIS — Z95828 Presence of other vascular implants and grafts: Secondary | ICD-10-CM

## 2017-07-30 DIAGNOSIS — Z8679 Personal history of other diseases of the circulatory system: Secondary | ICD-10-CM

## 2017-07-30 NOTE — Patient Instructions (Addendum)
Before your next abdominal ultrasound:  Take two Extra-Strength Gas-X capsules at bedtime the night before the test. Take another two Extra-Strength Gas-X capsules 3 hours before the test.  Avoid gas forming foods the day before the test.       Peripheral Vascular Disease Peripheral vascular disease (PVD) is a disease of the blood vessels that are not part of your heart and brain. A simple term for PVD is poor circulation. In most cases, PVD narrows the blood vessels that carry blood from your heart to the rest of your body. This can result in a decreased supply of blood to your arms, legs, and internal organs, like your stomach or kidneys. However, it most often affects a person's lower legs and feet. There are two types of PVD.  Organic PVD. This is the more common type. It is caused by damage to the structure of blood vessels.  Functional PVD. This is caused by conditions that make blood vessels contract and tighten (spasm).  Without treatment, PVD tends to get worse over time. PVD can also lead to acute ischemic limb. This is when an arm or limb suddenly has trouble getting enough blood. This is a medical emergency. Follow these instructions at home:  Take medicines only as told by your doctor.  Do not use any tobacco products, including cigarettes, chewing tobacco, or electronic cigarettes. If you need help quitting, ask your doctor.  Lose weight if you are overweight, and maintain a healthy weight as told by your doctor.  Eat a diet that is low in fat and cholesterol. If you need help, ask your doctor.  Exercise regularly. Ask your doctor for some good activities for you.  Take good care of your feet. ? Wear comfortable shoes that fit well. ? Check your feet often for any cuts or sores. Contact a doctor if:  You have cramps in your legs while walking.  You have leg pain when you are at rest.  You have coldness in a leg or foot.  Your skin changes.  You are unable to  get or have an erection (erectile dysfunction).  You have cuts or sores on your feet that are not healing. Get help right away if:  Your arm or leg turns cold and blue.  Your arms or legs become red, warm, swollen, painful, or numb.  You have chest pain or trouble breathing.  You suddenly have weakness in your face, arm, or leg.  You become very confused or you cannot speak.  You suddenly have a very bad headache.  You suddenly cannot see. This information is not intended to replace advice given to you by your health care provider. Make sure you discuss any questions you have with your health care provider. Document Released: 07/09/2009 Document Revised: 09/20/2015 Document Reviewed: 09/22/2013 Elsevier Interactive Patient Education  2017 [employer]  

## 2017-07-30 NOTE — Progress Notes (Signed)
[facility] [place]  CC: Follow up s/p Endovascular Repair of Abdominal Aortic Aneurysm and peripheral artery occlusive disease   History of Present Illness  Mathew Padilla is a 67 y.o. (11-03-1950) male who returns for follow-up evaluation of AAA. He underwent Zenith aneurysm stent graft repair in 2007 by Dr. Oneida Alar  The patient denies new abdominal or back pain. He has also undergone a left-to-right femoral-femoral bypass for limb occlusion of a Zenith graft. At the same time he had a right femoral to below-knee popliteal bypass with propaten. This was in December of 2012. He subsequently underwent a left above-knee to below-knee popliteal bypass with vein in March of 2013. This is chronically occluded. He reports no claudication symptoms. He does have some intermittent swelling of his lower extremities. He also complains of burning and stinging in both feet consistent with neuropathy. He has had minimal relief with Lyrica or Neurontin. The patient's atherosclerotic risk factors remain obesity, hypertension.These are all currently stable and followed by hisprimary care physician.  He has undergone multilevel back surgery which has significantly improved his neuropathy.  He was seen by home health and they were concerned about redness in his right foot.  He saw Vinnie Level, and is back for follow-up with me.  He did have a duplex that shows an occluded right femoral-popliteal bypass graft.  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   The patient does not have any open wounds or evidence of infection.  He does not have rest pain.    Pt Diabetic: Yes, last A1C was 6.7 on 02/18/16, has lost a great deal of weight since the gastric sleeve procedure in June 2017. Pt smoker: former smoker, quit in 1999, started about age 28   Past Medical History:  Diagnosis Date  . AAA (abdominal aortic aneurysm) ([place]) 2007   stent graft repair 12/07  .  Arteriosclerotic cardiovascular disease (ASCVD)    a. MI in 1999;  b.  PTCA of PDA and 2nd marginal in 3/00;  c. 02/2012 Cath: LM nl, LAD 69m, LCX 70m, OM small, 70, RCA 99p/137m-->Med Rx;  d. 03/2014 MV: EF 32%, inflat infarct w/ mild peri-infarct isch towards apex (similar to 2013 MV); d. 03/2014 Echo: EF 45-50%, Gr 1 DD, mildly dil LA.  Marland Kitchen CAD (coronary artery disease)   . Cerebrovascular disease   . CHF (congestive heart failure) ([place])   . Cholelithiasis 11/28/2010  . Cough, persistent   . Diabetes mellitus without complication ([place])   . Diverticulitis 2006  . DVT (deep venous thrombosis) ([place])   . Elevated PSA 06/2011   a. s/p TURP 01/2014.  Marland Kitchen Gastroesophageal reflux disease    Hiatal hernia; previously treated with PPI  . Hypertension    Mild; controlled with a single agent    . Laryngeal carcinoma ([place]) 2010   resection and radiation therapy 2010-11  . Myocardial infarction ([place])   . Obesity    BMI 47; lap band surgery 2010  . 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 Sleep apnea    BiPAP mask utilized        2 yrs sleep study    dr Redmond Pulling  . Tobacco abuse, in remission    40 pack years; discontinued in 2000   Past Surgical History:  Procedure Laterality Date  . ANTERIOR LAT LUMBAR FUSION N/A 02/22/2016   Procedure: LUMBAR TWO-LUMBAR FIVE ANTERIOR LATERAL LUMBAR INTERBODY FUSION WITH PERCUTANEOUS PEDICLE SCREWS;  Surgeon: Kevan Ny Ditty, MD;  Location: [facility];  Service: Neurosurgery;  Laterality: N/A;  Left side approach  . CARDIAC CATHETERIZATION  2000   PCI-stent  . CATARACT EXTRACTION    . COLONOSCOPY  10/2007  . COLONOSCOPY WITH ESOPHAGOGASTRODUODENOSCOPY (EGD)  2009   RMR: He had distal esophageal erosions, patulous GE junction, erosive reflux esophagitis, hiatal hernia, lipoma on the angularis not manipulated, left-sided diverticula.  . CYSTOSCOPY  02/22/2016   Procedure: CYSTOSCOPY FLEXIBLE WITH URETHER DILATION  WITH CATHETER PLACEMENT;  Surgeon:  Alexis Frock, MD;  Location: [facility];  Service: Urology;;  . EYE SURGERY    . FEMORAL-FEMORAL BYPASS GRAFT  04/18/2011   Procedure: BYPASS GRAFT FEMORAL-FEMORAL ARTERY;  Surgeon: Theotis Burrow, MD;  [facility];  Service: Vascular;  Laterality: Bilateral;  Left to right femoral to femoral bypass   . FEMORAL-POPLITEAL BYPASS GRAFT  04/18/2011   Procedure: BYPASS GRAFT FEMORAL-POPLITEAL ARTERY;  Surgeon: Theotis Burrow, MD;  [facility];  Service: Vascular;  Laterality: Right;  Right femoral popliteal bypass with propaten gortex graft  . gastic sleeve  07/2015   gastic band removed and have sleeve  . LAPAROSCOPIC GASTRIC BANDING  2010  . LARYNX SURGERY  2010   Resection of carcinoma  . LEFT HEART CATHETERIZATION WITH CORONARY ANGIOGRAM N/A 03/10/2012   Procedure: LEFT HEART CATHETERIZATION WITH CORONARY ANGIOGRAM;  Surgeon: Burnell Blanks, MD;  [facility] C[facility] LAB;  Service: Cardiovascular;  Laterality: N/A;  . LUMBAR PERCUTANEOUS PEDICLE SCREW 3 LEVEL N/A 02/22/2016   Procedure: LUMBAR PERCUTANEOUS PEDICLE SCREW LUMBAR TWO-LUMBAR FIVE;  Surgeon: Kevan Ny Ditty, MD;  Location: [facility];  Service: Neurosurgery;  Laterality: N/A;  . OPEN ANTERIOR SHOULDER RECONSTRUCTION  2011   Left  . PERIPHERAL ARTERIAL STENT GRAFT  2007   for abdominal aortic aneurysm  . PR VEIN BYPASS GRAFT,AORTO-FEM-POP  07-23-11   Left AK to BK popliteal BPG using rev. saphenous vein   Social History Social History   Tobacco Use  . Smoking status: Former Smoker    Packs/day: 2.00    Years: 30.00    Pack years: 60.00    Types: Cigarettes    Last attempt to quit: 11/26/1997    Years since quitting: 19.6  . Smokeless tobacco: Never Used  . Tobacco comment: 40 pack years; quit 2000  Substance Use Topics  . Alcohol use: No    Alcohol/week: 0.0 oz  . Drug use: No   Family History Family History  Problem Relation Age of Onset  . Heart attack Father        deceased  . Heart failure Father    . Hyperlipidemia Father   . Hypertension Father   . Heart disease Father   . Lung cancer Mother        deceased   . Hypertension Mother   . Hyperlipidemia Mother   . Heart failure Mother   . Diabetes Mother   . Cancer Sister        vaginal  . Diabetes Sister   . Colon cancer Neg Hx    Current Outpatient Medications on File Prior to Visit  Medication Sig Dispense Refill  . acetaminophen (TYLENOL) 500 MG tablet Take 500 mg by mouth every 6 (six) hours as needed for mild pain.    Marland Kitchen aspirin EC 81 MG tablet Take 1 tablet (81 mg total) by mouth daily. (Patient taking differently: Take 325 mg by mouth daily. ) 90 tablet 3  . baclofen (LIORESAL) 10  MG tablet Take 10 mg by mouth 3 (three) times daily.    . Cholecalciferol (VITAMIN D) 2000 units CAPS Take 2,000 Units by mouth once a week.     . Esomeprazole Magnesium (NEXIUM PO) Take 1 tablet by mouth daily as needed (heartburn).    . gabapentin (NEURONTIN) 400 MG capsule Take 400 mg by mouth daily as needed (pain).    . hydrALAZINE (APRESOLINE) 50 MG tablet Take 50 mg by mouth See admin instructions. Patient only takes when he has elevated blood pressure readings.    . irbesartan (AVAPRO) 150 MG tablet Take 1 tablet (150 mg total) by mouth daily. Please make an overdue appt with Dr. Angelena Form, 1st Attempt 30 tablet 0  . KLOR-CON M20 20 MEQ tablet TAKE ONE TABLET BY MOUTH ONCE DAILY 90 tablet 1  . Menthol, Topical Analgesic, (ICY HOT EX) Apply 1 application topically daily as needed (back pain).    . metFORMIN (GLUCOPHAGE) 500 MG tablet Take 500 mg by mouth as needed.    . Multiple Vitamin (MULTIVITAMIN WITH MINERALS) TABS tablet Take 1 tablet by mouth daily.    . nitroGLYCERIN (NITROSTAT) 0.4 MG SL tablet Place 1 tablet (0.4 mg total) under the tongue every 5 (five) minutes as needed for chest pain. 25 tablet 11  . pregabalin (LYRICA) 50 MG capsule Take 2 capsules (100 mg total) by mouth 2 (two) times daily. 120 capsule 0  . tapentadol 100 MG  TABS Take 1 tablet (100 mg total) by mouth every 4 (four) hours as needed for severe pain. 80 tablet 0  . torsemide (DEMADEX) 20 MG tablet Take 20 mg by mouth daily.    . Vitamin D, Ergocalciferol, (DRISDOL) 50000 units CAPS capsule TAKE ONE CAPSULE BY MOUTH ONCE A WEEK    . zolpidem (AMBIEN) 5 MG tablet Take 5 mg by mouth at bedtime as needed for sleep.    . methocarbamol (ROBAXIN) 750 MG tablet Take 1 tablet (750 mg total) by mouth 4 (four) times daily. (Patient not taking: Reported on 07/30/2017) 120 tablet 2   No current facility-administered medications on file prior to visit.    Allergies  Allergen Reactions  . Finasteride Other (See Comments)    Unable to void Unable to void Unable to void  . Other Rash    Beta-blockers Beta-blockers Beta-blockers  . Statins Hives and Rash  . Aspirin Other (See Comments)    Cannot take daily, just once in a while Cannot take daily, just once in a while Other reaction(s): GI Upset (intolerance) Cannot take daily, just once in a while Daily dose upsets stomach     ROS: See HPI for pertinent positives and negatives.  Physical Examination  Vitals:   07/30/17 1037  BP: 133/78  Pulse: 69  Resp: 18  Temp: (!) 97.2 F (36.2 C)  TempSrc: Oral  SpO2: 95%  Weight: 276 lb (125.2 kg)  Height: 5\' 11"  (1.803 m)   Body mass index is 38.49 kg/m.  General: A&O x 3, WD, obese male HEENT: No gross abnormalities  Pulmonary: Sym exp, respirations are non labored, good air movement in all fields, CTAB, no rales, rhonchi, or wheezing.  Cardiac: Regular rhythm and rate, no murmur appreciated  Vascular: Vessel Right Left  Radial 2+Palpable 2+Palpable  Carotid  without bruit  without bruit  Aorta Not palpable N/A  Femoral 2+Palpable 2+Palpable  Popliteal Not palpable Not palpable  PT notPalpable notPalpable  DP notPalpable Not Palpable  Fem-fem bypass graft 2+ palpable N/A  Gastrointestinal: soft, NTND, -G/R, - HSM, - palpable masses, large  panus, - CVAT B. Musculoskeletal: M/S 5/5 throughout, extremities without ischemic changes. Skin: No rashes, no ulcers, no cellulitis.   Neurologic: Pain and light touch intact in extremities, Motor exam as listed above CN 2-12 grossly intact except is hard of hearing.  Psychiatric: Normal thought content, mood appropriate for clinical situation.    DATA  EVAR Duplex (Date: 07/30/17):  AAA sac size: 3.5 cm   no endoleak detected  Visualization of the mid and distal abdominal aorta, and proximal bilateral common iliac arteries was limited due to overlying bowel gas and obesity.   CTA Abd/Pelvis Duplex (Date: 07-15-16) 1. Post endovascular repair of infrarenal abdominal aortic aneurysm. The native abdominal aortic aneurysm has been completely excluded and remains opposed against the wall of the stent graft. No evidence of endoleak. 2. Re- demonstrated occlusion of the right iliac gait of the abdominal aortic stent graft. 3. Stable sequela of left-to-right fem-fem bypass graft which appears widely patent. 4. Interval occlusion of the right femoral bypass graft, new compared to the 06/2014 examination, our there is minimal opacification of the native right superficial femoral artery. Clinical correlation is advised. 5. Chronic occlusion of the imaged portion of the left superficial femoral artery, unchanged.   Medical Decision Making  Mathew Padilla is a 67 y.o. male who is s/p Zenith aneurysm stent graft repair in 2007.   Historically he has had excessive bowel gas with limited visualization of abdominal vasculature. He states his C-PAP pushes air into his stomach. CT abd/pelvis in 2018 demonstrated stent graft in good position, the aneurysm had completely shrunk down around the stent graft. The femoral-femoral bypass was patent.  He has also undergone a left-to-right femoral-femoral bypass for limb occlusion of a Zenith graft in 2012 by Dr. Trula Slade. At the same time he had a  right femoral to below-knee popliteal bypass with propaten in 2012 by Dr. Trula Slade. This was in December of 2012. He subsequently underwent a left above-knee to below-knee popliteal bypass with vein in March of 2013. He has occlusions of his bilateral femoropopliteal bypasses, chronic in nature and asymptomatic  Since the June 2017 gastric sleeve procedure, he has lost about 40 pounds.  He is able to walk more after multilevel spine surgery.   I discussed with the patient the importance of surveillance of the endograft.  The next endograft duplex and ABI's will be scheduled for 12 months.  The patient will follow up with [place] in 12 months with these studies.  I emphasized the importance of maximal medical management including strict control of blood pressure, blood glucose, and lipid levels, antiplatelet agents, obtaining regular exercise, and cessation of smoking.   Thank you for allowing [place] to participate in this patient's care.  Clemon Chambers, RN, MSN, FNP-C [facility] [place] Office: [facility] Physician: Oneida Alar  07/30/2017, 11:00 AM

## 2017-08-05 ENCOUNTER — Telehealth: Payer: Self-pay | Admitting: Cardiovascular Disease

## 2017-08-05 NOTE — Telephone Encounter (Signed)
Walk in Pt Form-Sealed Envelope dropped off placed in Dr.McAlhany doc box.

## 2017-08-07 ENCOUNTER — Telehealth: Payer: Self-pay | Admitting: Cardiovascular Disease

## 2017-08-07 NOTE — Telephone Encounter (Signed)
I spoke with Mathew Padilla and told him we had received paperwork he dropped off.  Mathew Padilla is seeing Richardson Dopp, [place] on 4/19 and is aware clearance and any testing needed will be addressed at this appointment.

## 2017-08-07 NOTE — Telephone Encounter (Signed)
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 Advised spouse, forms pending ; appointment with Kathlen Mody on 08/14/17. Spouse requesting nurse call

## 2017-08-13 ENCOUNTER — Encounter (INDEPENDENT_AMBULATORY_CARE_PROVIDER_SITE_OTHER): Payer: Self-pay | Admitting: Orthopaedic Surgery

## 2017-08-13 ENCOUNTER — Ambulatory Visit (INDEPENDENT_AMBULATORY_CARE_PROVIDER_SITE_OTHER): Payer: Medicare Other | Admitting: Orthopaedic Surgery

## 2017-08-13 VITALS — BP 154/91 | HR 70 | Ht 71.0 in | Wt 270.0 lb

## 2017-08-13 DIAGNOSIS — M67921 Unspecified disorder of synovium and tendon, right upper arm: Secondary | ICD-10-CM | POA: Diagnosis not present

## 2017-08-13 DIAGNOSIS — M75121 Complete rotator cuff tear or rupture of right shoulder, not specified as traumatic: Secondary | ICD-10-CM | POA: Diagnosis not present

## 2017-08-13 NOTE — Progress Notes (Signed)
Office Visit Note   Patient: Mathew Padilla           Date of Birth: 1951-03-25           MRN: [id number] Visit Date: 08/13/2017              Requested by: Sinda Du, MD [address], [place] [address] PCP: Sinda Du, MD   Assessment & Plan: Visit Diagnoses:  1. Biceps tendinopathy of right upper extremity   2. Nontraumatic complete tear of right rotator cuff     Plan: I will recheck him again in 6 weeks.  Presently is not having any symptoms in the shoulder despite the retracted rotator cuff tear and partial biceps tendon.  We discussed shoulder arthroscopy if he gets increased symptoms.  Recheck 6 weeks.  Follow-Up Instructions: Return in about 6 weeks (around 09/24/2017).   Orders:  No orders of the defined types were placed in this encounter.  No orders of the defined types were placed in this encounter.     Procedures: No procedures performed   Clinical Data: No additional findings.   Subjective: Chief Complaint  Patient presents with  . Right Shoulder - Follow-up, Pain    MRI review    HPI patient returns post MRI scan right shoulder.  He has supraspinatus tear without significant atrophy.  There is 4 cm retraction of the infraspinatus tendon.  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  No problems with sleeping dressing or bathing.  I scan showed partial tearing of the long head of the biceps intra-articularly.  This is likely a symptomatic problem.  Review of Systems 14 point review of systems updated unchanged from last office visit prior to the MRI scan other than as mentioned in HPI.   Objective: Vital Signs: BP (!) 154/91   Pulse 70   Ht 5\' 11"  (1.803 m)   Wt 270 lb (122.5 kg)   BMI 37.66 kg/m   Physical Exam  Constitutional: He is oriented to person, place, and time. He appears well-developed and  well-nourished.  HENT:  Head: Normocephalic and atraumatic.  Eyes: Pupils are equal, round, and reactive to light. EOM are normal.  Neck: No tracheal deviation present. No thyromegaly present.  Cardiovascular: Normal rate.  Pulmonary/Chest: Effort normal. He has no wheezes.  Abdominal: Soft. Bowel sounds are normal.  Neurological: He is alert and oriented to person, place, and time.  Skin: Skin is warm and dry. Capillary refill takes less than 2 seconds.  Psychiatric: He has a normal mood and affect. His behavior is normal. Judgment and thought content normal.    Ortho Exam is able to get his arm over his head.  No distal migration of the biceps muscle.  Specialty Comments:  No specialty comments available.  Imaging: CLINICAL DATA:  Right shoulder pain, decreased range of motion  EXAM: MRI OF THE RIGHT SHOULDER WITHOUT CONTRAST  TECHNIQUE: Multiplanar, multisequence MR imaging of the shoulder was performed. No intravenous contrast was administered.  COMPARISON:  None.  FINDINGS: Rotator cuff: Complete tear of the supraspinatus and infraspinatus tendons with 4 cm of retraction. Teres minor tendon is intact. Moderate tendinosis of the subscapularis tendon.  Muscles: Mild muscle atrophy of the infraspinatus muscle. Remainder the rotator cuff muscles demonstrate no focal abnormality.  Biceps long head: Tendinosis of the intra-articular portion of the long head  of the biceps tendon with a partial-thickness tear.  Acromioclavicular Joint: Severe arthropathy of the acromioclavicular joint. Type II acromion. Small amount of subacromial/subdeltoid bursal fluid.  Glenohumeral Joint: Small joint effusion. Moderate amount of fluid in the subcoracoid recess with multiple small low signal foci likely reflecting synovitis. High-grade partial-thickness cartilage loss of the glenohumeral joint with small inferior marginal osteophyte.  Labrum:  Superior labral  degeneration.  Bones: No acute osseous abnormality. Subcortical reactive marrow changes of the humeral head. No aggressive osseous lesion.  Other: No fluid collection or hematoma.  IMPRESSION: 1. Complete tear of the supraspinatus and infraspinatus tendons with 4 cm of retraction. 2. Moderate tendinosis of the subscapularis tendon. Mild muscle atrophy of the infraspinatus muscle. 3. Tendinosis of the intra-articular portion of the long head of the biceps tendon with a partial-thickness tear. 4. Moderate osteoarthritis of glenohumeral joint.   Electronically Signed   By: Kathreen Devoid   On: 07/25/2017 10:19    PMFS History: Patient Active Problem List   Diagnosis Date Noted  . Lumbosacral spondylosis with radiculopathy 02/22/2016  . Pulmonary nodules/lesions, multiple 11/05/2013  . Prostate enlargement 11/05/2013  . Infection of urinary tract 11/05/2013  . Pyelonephritis 11/03/2013  . Sepsis-unclear source 11/03/2013  . History of laparoscopic adjustable gastric banding, APL, 12/19/2008. 05/12/2012  . Cold feet 04/01/2012  . Chest pain 04/01/2012  . Atherosclerosis of native arteries of the extremities with intermittent claudication 11/06/2011  . Popliteal aneurysm ([place]) 08/07/2011  . S/P bypass graft of extremity 08/07/2011  . Abdominal aneurysm without mention of rupture 07/17/2011  . Peripheral vascular disease, unspecified ([place]) 05/12/2011  . Pulmonary infiltrate 12/02/2010  . Cholelithiasis 11/28/2010  . Low back pain 11/25/2010  . Pneumonia 11/25/2010  . Arteriosclerotic cardiovascular disease (ASCVD)   . Laryngeal carcinoma ([place])   . AAA (abdominal aortic aneurysm) ([place])   . Diverticulitis   . Obesity   . Gastroesophageal reflux disease   . Sleep apnea   . Tobacco abuse, in remission   . Cerebrovascular disease   . Essential hypertension 11/21/2009   Past Medical History:  Diagnosis Date  . AAA (abdominal aortic aneurysm) ([place]) 2007   stent graft  repair 12/07  . Arteriosclerotic cardiovascular disease (ASCVD)    a. MI in 1999;  b.  PTCA of PDA and 2nd marginal in 3/00;  c. 02/2012 Cath: LM nl, LAD 60m, LCX 87m, OM small, 70, RCA 99p/110m-->Med Rx;  d. 03/2014 MV: EF 32%, inflat infarct w/ mild peri-infarct isch towards apex (similar to 2013 MV); d. 03/2014 Echo: EF 45-50%, Gr 1 DD, mildly dil LA.  Marland Kitchen CAD (coronary artery disease)   . Cerebrovascular disease   . CHF (congestive heart failure) ([place])   . Cholelithiasis 11/28/2010  . Cough, persistent   . Diabetes mellitus without complication ([place])   . Diverticulitis 2006  . DVT (deep venous thrombosis) ([place])   . Elevated PSA 06/2011   a. s/p TURP 01/2014.  Marland Kitchen Gastroesophageal reflux disease    Hiatal hernia; previously treated with PPI  . Hypertension    Mild; controlled with a single agent    . Laryngeal carcinoma ([place]) 2010   resection and radiation therapy 2010-11  . Myocardial infarction ([place])   . Obesity    BMI 47; lap band surgery 2010  . Popliteal aneurysm ([place])    popliteal bypass-2013  . Sleep apnea    BiPAP mask utilized        2 yrs sleep study    dr Redmond Pulling  .  Tobacco abuse, in remission    40 pack years; discontinued in 2000    Family History  Problem Relation Age of Onset  . Heart attack Father        deceased  . Heart failure Father   . Hyperlipidemia Father   . Hypertension Father   . Heart disease Father   . Lung cancer Mother        deceased   . Hypertension Mother   . Hyperlipidemia Mother   . Heart failure Mother   . Diabetes Mother   . Cancer Sister        vaginal  . Diabetes Sister   . Colon cancer Neg Hx     Past Surgical History:  Procedure Laterality Date  . ANTERIOR LAT LUMBAR FUSION N/A 02/22/2016   Procedure: LUMBAR TWO-LUMBAR FIVE ANTERIOR LATERAL LUMBAR INTERBODY FUSION WITH PERCUTANEOUS PEDICLE SCREWS;  Surgeon: Kevan Ny Ditty, MD;  Location: [facility];  Service: Neurosurgery;  Laterality: N/A;  Left side approach  . CARDIAC  CATHETERIZATION  2000   PCI-stent  . CATARACT EXTRACTION    . COLONOSCOPY  10/2007  . COLONOSCOPY WITH ESOPHAGOGASTRODUODENOSCOPY (EGD)  2009   RMR: He had distal esophageal erosions, patulous GE junction, erosive reflux esophagitis, hiatal hernia, lipoma on the angularis not manipulated, left-sided diverticula.  . CYSTOSCOPY  02/22/2016   Procedure: CYSTOSCOPY FLEXIBLE WITH URETHER DILATION  WITH CATHETER PLACEMENT;  Surgeon: Alexis Frock, MD;  Location: [facility];  Service: Urology;;  . EYE SURGERY    . FEMORAL-FEMORAL BYPASS GRAFT  04/18/2011   Procedure: BYPASS GRAFT FEMORAL-FEMORAL ARTERY;  Surgeon: Theotis Burrow, MD;  [facility];  Service: Vascular;  Laterality: Bilateral;  Left to right femoral to femoral bypass   . FEMORAL-POPLITEAL BYPASS GRAFT  04/18/2011   Procedure: BYPASS GRAFT FEMORAL-POPLITEAL ARTERY;  Surgeon: Theotis Burrow, MD;  [facility];  Service: Vascular;  Laterality: Right;  Right femoral popliteal bypass with propaten gortex graft  . gastic sleeve  07/2015   gastic band removed and have sleeve  . LAPAROSCOPIC GASTRIC BANDING  2010  . LARYNX SURGERY  2010   Resection of carcinoma  . LEFT HEART CATHETERIZATION WITH CORONARY ANGIOGRAM N/A 03/10/2012   Procedure: LEFT HEART CATHETERIZATION WITH CORONARY ANGIOGRAM;  Surgeon: Burnell Blanks, MD;  Location: [facility] CATH LAB;  Service: Cardiovascular;  Laterality: N/A;  . LUMBAR PERCUTANEOUS PEDICLE SCREW 3 LEVEL N/A 02/22/2016   Procedure: LUMBAR PERCUTANEOUS PEDICLE SCREW LUMBAR TWO-LUMBAR FIVE;  Surgeon: Kevan Ny Ditty, MD;  Location: [facility];  Service: Neurosurgery;  Laterality: N/A;  . OPEN ANTERIOR SHOULDER RECONSTRUCTION  2011   Left  . PERIPHERAL ARTERIAL STENT GRAFT  2007   for abdominal aortic aneurysm  . PR VEIN BYPASS GRAFT,AORTO-FEM-POP  07-23-11   Left AK to BK popliteal BPG using rev. saphenous vein   Social History   Occupational History  . Occupation: Retired, part Doctor, general practice   Tobacco Use  . Smoking status: Former Smoker    Packs/day: 2.00    Years: 30.00    Pack years: 60.00    Types: Cigarettes    Last attempt to quit: 11/26/1997    Years since quitting: 19.7  . Smokeless tobacco: Never Used  . Tobacco comment: 40 pack years; quit 2000  Substance and Sexual Activity  . Alcohol use: No    Alcohol/week: 0.0 oz  . Drug use: No  . Sexual activity: Yes

## 2017-08-14 ENCOUNTER — Encounter: Payer: Self-pay | Admitting: Physician Assistant

## 2017-08-14 ENCOUNTER — Ambulatory Visit: Payer: Medicare Other | Admitting: Physician Assistant

## 2017-08-14 VITALS — BP 146/88 | HR 70 | Ht 71.0 in | Wt 283.4 lb

## 2017-08-14 DIAGNOSIS — I10 Essential (primary) hypertension: Secondary | ICD-10-CM

## 2017-08-14 DIAGNOSIS — I739 Peripheral vascular disease, unspecified: Secondary | ICD-10-CM | POA: Diagnosis not present

## 2017-08-14 DIAGNOSIS — E785 Hyperlipidemia, unspecified: Secondary | ICD-10-CM

## 2017-08-14 DIAGNOSIS — I5042 Chronic combined systolic (congestive) and diastolic (congestive) heart failure: Secondary | ICD-10-CM | POA: Diagnosis not present

## 2017-08-14 DIAGNOSIS — I2510 Atherosclerotic heart disease of native coronary artery without angina pectoris: Secondary | ICD-10-CM | POA: Diagnosis not present

## 2017-08-14 MED ORDER — ASPIRIN EC 81 MG PO TBEC: 81.0000 mg | DELAYED_RELEASE_TABLET | ORAL | Status: AC

## 2017-08-14 NOTE — Patient Instructions (Signed)
Medication Instructions:  1. Your physician recommends that you continue on your current medications as directed. Please refer to the Current Medication list given to you today.   Labwork: TODAY CMET, LIPIDS  Testing/Procedures: Your physician has requested that you have an echocardiogram. Echocardiography is a painless test that uses sound waves to create images of your heart. It provides your doctor with information about the size and shape of your heart and how well your heart's chambers and valves are working. This procedure takes approximately one hour. There are no restrictions for this procedure.    Follow-Up: Your physician wants you to follow-up in: [place] DR. Angelena Form  You will receive a reminder letter in the mail two months in advance. If you don't receive a letter, please call our office to schedule the follow-up appointment.   Any Other Special Instructions Will Be Listed Below (If Applicable).     If you need a refill on your cardiac medications before your next appointment, please call your pharmacy.

## 2017-08-14 NOTE — Progress Notes (Signed)
Cardiology Office Note:    Date:  08/14/2017   ID:  Mathew Padilla, DOB [birth year], MRN [id number]  PCP:  Sinda Du, MD  Cardiologist:  Lauree Chandler, MD  VVS: Ruta Hinds, MD  Referring MD: Sinda Du, MD   Chief Complaint  Patient presents with  . Follow-up    CAD, CHF    History of Present Illness:    Mathew Padilla is a 67 y.o. male with coronary artery disease status post myocardial infarction in 1999 treated with tissue plasminogen activator and stenting to the RCA, POBA to the PDA in 06/1998, peripheral arterial disease, hypertension, sleep apnea.  Cardiac catheterization in 2013 demonstrated moderate LAD and circumflex stenosis, severe stenosis in an OM branch that was too small for PCI and an occluded RCA.  He was treated medically.  EF is 45-50 by echocardiogram.  He was last seen by Dr. Angelena Form 10/17.  He is intolerant of statins, beta blockers, amlodipine.  [name] returns for follow up.  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  Since last seen, he has done well.  He denies chest pain or significant shortness of breath.  He denies dizziness, syncope, paroxysmal nocturnal dyspnea, edema.  He is walking more now and he is not having any leg pain.  He takes ASA 325 mg every other day b/c taking it every day causes GI upset.   Prior CV studies:   The following studies were reviewed today:  GXT 09/01/16  Exercise stress test: Clinically negative for ischemia Electrically negatvie for ischemia Poor exercise tolerance. PVCs/NSVT noted in recovery.  Echo 04/25/14 EF 08-14, grade 1 diastolic dysfunction, mild LAE  Nuclear stress test 04/20/14 High risk stress nuclear study with a large, severe, partially reversible inferior lateral defect consistent with prior infarct and mild peri-infarct ischemia towards the apex. Study high risk due to reduced LV function.  LV Ejection Fraction: 32%.  Cardiac catheterization 03/10/12 LM okay LAD mid 50 LCx mid AV groove  50; distal OM 70 (too small for PCI) RCA proximal 99, 100-CTO  Nuclear stress test 02/18/12 Abnormal stress nuclear study. There is a moderate sized scar of moderate severity involving the mid-inferior, basal inferior, mid-inferolateral and basal inferolateral segments.  There is minimal reversibility.  LV Ejection Fraction: 38%.  Carotid US 09/29/2001 IMPRESSION PLAQUE FORMATION IN BOTH CAROTID BULBS AND BOTH PROXIMAL INTERNAL CAROTID ARTERIES WITHOUT SIGNIFICANT LUMINAL NARROWING OR FLOW REDUCTION.   Past Medical History:  Diagnosis Date  . AAA (abdominal aortic aneurysm) ([place]) 2007   stent graft repair 12/07  . Arteriosclerotic cardiovascular disease (ASCVD)    a. MI in 1999;  b.  PTCA of PDA and 2nd marginal in 3/00;  c. 02/2012 Cath: LM nl, LAD 6m LCX 556mOM small, 70, RCA 99p/10073mMed Rx;  d. 03/2014 MV: EF 32%, inflat infarct w/ mild peri-infarct isch towards apex (similar to 2013 MV); d. 03/2014 Echo: EF 45-50%, Gr 1 DD, mildly dil LA.  . CMarland KitchenD (coronary artery disease)   . Cerebrovascular disease   . CHF [place]estive heart failure) [place] . Cholelithiasis 11/28/2010  . Cough, persistent   . Diabetes mellitus without complication ([facility] . Diverticulitis 2006  . DVT (deep venous thrombosis) ([facility] . Elevated PSA 06/2011   a. s/p TURP 01/2014.  . GMarland Kitchenstroesophageal reflux disease    Hiatal hernia; previously treated with PPI  . Hypertension    Mild; controlled with a single agent    . Laryngeal carcinoma ([facility]  resection and radiation therapy 2010-11  . Myocardial infarction ([place])   . Obesity    BMI 47; lap band surgery 2010  . 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 Sleep apnea    BiPAP mask utilized        2 yrs sleep study    dr Redmond Pulling  . Tobacco abuse, in remission    40 pack years; discontinued in 2000   Surgical Hx: The patient  has a past surgical history that includes Peripheral arterial stent graft (2007); Larynx surgery (2010); Open  anterior shoulder reconstruction (2011); Laparoscopic gastric banding (2010); Eye surgery; Cataract extraction; Colonoscopy (10/2007); Femoral-femoral Bypass Graft (04/18/2011); Femoral-popliteal Bypass Graft (04/18/2011); Cardiac catheterization (2000); pr vein bypass graft,aorto-fem-pop (07-23-11); Colonoscopy with esophagogastroduodenoscopy (egd) (2009); left heart catheterization with coronary angiogram (N/A, 03/10/2012); gastic sleeve (07/2015); Anterior lat lumbar fusion (N/A, 02/22/2016); Lumbar percutaneous pedicle screw 3 level (N/A, 02/22/2016); and Cystoscopy (02/22/2016).   Current Medications: Current Meds  Medication Sig  . acetaminophen (TYLENOL) 500 MG tablet Take 500 mg by mouth every 6 (six) hours as needed for mild pain.  . baclofen (LIORESAL) 10 MG tablet Take 10 mg by mouth 3 (three) times daily.  . Cholecalciferol (VITAMIN D) 2000 units CAPS Take 2,000 Units by mouth once a week.   . Esomeprazole Magnesium (NEXIUM PO) Take 1 tablet by mouth daily as needed (heartburn).  . gabapentin (NEURONTIN) 400 MG capsule Take 400 mg by mouth daily as needed (pain).  . hydrALAZINE (APRESOLINE) 50 MG tablet Take 50 mg by mouth See admin instructions. Patient only takes when he has elevated blood pressure readings.  . irbesartan (AVAPRO) 150 MG tablet Take 1 tablet (150 mg total) by mouth daily. Please make an overdue appt with Dr. Angelena Form, 1st Attempt  . KLOR-CON M20 20 MEQ tablet TAKE ONE TABLET BY MOUTH ONCE DAILY  . Menthol, Topical Analgesic, (ICY HOT EX) Apply 1 application topically daily as needed (back pain).  . metFORMIN (GLUCOPHAGE) 500 MG tablet Take 500 mg by mouth as needed.  . methocarbamol (ROBAXIN) 750 MG tablet Take 1 tablet (750 mg total) by mouth 4 (four) times daily.  . Multiple Vitamin (MULTIVITAMIN WITH MINERALS) TABS tablet Take 1 tablet by mouth daily.  . nitroGLYCERIN (NITROSTAT) 0.4 MG SL tablet Place 1 tablet (0.4 mg total) under the tongue every 5 (five) minutes  as needed for chest pain.  . pregabalin (LYRICA) 50 MG capsule Take 2 capsules (100 mg total) by mouth 2 (two) times daily.  Marland Kitchen torsemide (DEMADEX) 20 MG tablet Take 20 mg by mouth daily.  . Vitamin D, Ergocalciferol, (DRISDOL) 50000 units CAPS capsule TAKE ONE CAPSULE BY MOUTH ONCE A WEEK  . [DISCONTINUED] aspirin 325 MG tablet Take 325 mg by mouth every other day.  . [DISCONTINUED] aspirin EC 81 MG tablet Take 1 tablet (81 mg total) by mouth daily. (Patient taking differently: Take 325 mg by mouth daily. )     Allergies:   Finasteride; Other; Statins; and Aspirin   Social History   Tobacco Use  . Smoking status: Former Smoker    Packs/day: 2.00    Years: 30.00    Pack years: 60.00    Types: Cigarettes    Last attempt to quit: 11/26/1997    Years since quitting: 19.7  . Smokeless tobacco: Never Used  . Tobacco comment: 40 pack years; quit 2000  Substance Use Topics  . Alcohol use: No    Alcohol/week: 0.0 oz  . Drug use:  No     Family Hx: The patient's family history includes Cancer in his sister; Diabetes in his mother and sister; Heart attack in his father; Heart disease in his father; Heart failure in his father and mother; Hyperlipidemia in his father and mother; Hypertension in his father and mother; Lung cancer in his mother. There is no history of Colon cancer.  ROS:   Please see the history of present illness.    Review of Systems  Constitution: Positive for weight gain.  Cardiovascular: Positive for leg swelling.  Respiratory: Positive for cough.   Musculoskeletal: Positive for back pain.   All other systems reviewed and are negative.   EKGs/Labs/Other Test Reviewed:    EKG:  EKG is  ordered today.  The ekg ordered today demonstrates normal sinus rhythm, heart rate 70, normal axis, T wave inversions 1, aVL, V5-V6, no significant change when compared to prior tracings.   Recent Labs: No results found for requested labs within last 8760 hours.   Recent Lipid  Panel Lab Results  Component Value Date/Time   CHOL 171 11/01/2012 11:20 AM   TRIG 244.0 (H) 11/01/2012 11:20 AM   HDL 48.30 11/01/2012 11:20 AM   CHOLHDL 4 11/01/2012 11:20 AM   LDLCALC 109 (H) 03/19/2010 08:15 PM   LDLDIRECT 84.7 11/01/2012 11:20 AM    Physical Exam:    VS:  BP (!) 146/88   Pulse 70   Ht '5\' 11"'$  (1.803 m)   Wt 283 lb 6.4 oz (128.5 kg)   SpO2 97%   BMI 39.53 kg/m     Wt Readings from Last 3 Encounters:  08/14/17 283 lb 6.4 oz (128.5 kg)  08/13/17 270 lb (122.5 kg)  07/30/17 276 lb (125.2 kg)     Physical Exam  Constitutional: He is oriented to person, place, and time. He appears well-developed and well-nourished. No distress.  HENT:  Head: Normocephalic and atraumatic.  Neck: Neck supple. No JVD present. Carotid bruit is not present.  Cardiovascular: Normal rate, regular rhythm, S1 normal and S2 normal.  No murmur heard. Pulmonary/Chest: Breath sounds normal. He has no rales.  Abdominal: Soft. There is no hepatomegaly.  Musculoskeletal: He exhibits no edema.  Neurological: He is alert and oriented to person, place, and time.  Skin: Skin is warm and dry.    ASSESSMENT & PLAN:    #1.  Coronary artery disease History of remote myocardial infarction treated with TPA and stenting of the RCA in 1999 and angioplasty of the PDA in 2000.  Cardiac catheterization in 2013 demonstrated chronically occluded RCA and 70% stenosis in a small OM.  He has been managed medically.  He is doing well without angina.  He had an exercise treadmill test last year that was low risk.  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  I have completed the paperwork.  It appears that he needs a follow-up echo to demonstrate an EF greater than 40%.  I have given him a copy of his echo from 2015 and placed an order for repeat echocardiogram.  He has been taking aspirin 325 mg every other day.  I have asked him to reduce this to 81 mg every other day.  -Change aspirin 81 mg every  other day  -He is intolerant of statin   #2.  Chronic combined systolic and diastolic CHF (congestive heart failure) (HCC)  EF 45-50% by echocardiogram in 2015.  He is NYHA 2.  He does not display any signs of volume excess  on exam.  He has been intolerant of beta-blockers in the past.  Continue ARB, current dose torsemide.  Obtain follow-up CMET today.  Arrange echo for DOT license renewal.  #3.  Essential hypertension Blood pressure elevated here today.  However, he follows it closely at home and it is usually optimal.  He takes hydralazine as needed for elevated blood pressure.  #4.  Hyperlipidemia, unspecified hyperlipidemia type  Intolerant of statins.  Obtain CMET, lipids today.  Consider referral to CVRR clinic for consideration of PCSK-9 inhibitor therapy.  #5.  Peripheral vascular disease, unspecified ([place]) Continue follow-up with vascular surgery as planned.   Dispo:  Return in about 1 year (around 08/15/2018) for Routine Follow Up w/ Dr. Angelena Form.   Medication Adjustments/Labs and Tests Ordered: Current medicines are reviewed at length with the patient today.  Concerns regarding medicines are outlined above.  Tests Ordered: Orders Placed This Encounter  Procedures  . Lipid Profile  . Comp Met (CMET)  . EKG 12-Lead  . ECHOCARDIOGRAM COMPLETE   Medication Changes: Meds ordered this encounter  Medications  . aspirin EC 81 MG tablet    Sig: Take 1 tablet (81 mg total) by mouth every other day.    Signed, Richardson Dopp, PA-C  08/14/2017 12:11 PM    [facility] [place], [place], [place]  [address] Phone: [phone]; Fax: [phone]

## 2017-08-15 LAB — COMPREHENSIVE METABOLIC PANEL
ALBUMIN: 4.1 g/dL (ref 3.6–4.8)
ALK PHOS: 88 IU/L (ref 39–117)
ALT: 23 IU/L (ref 0–44)
AST: 21 IU/L (ref 0–40)
Albumin/Globulin Ratio: 1.8 (ref 1.2–2.2)
BILIRUBIN TOTAL: 0.3 mg/dL (ref 0.0–1.2)
BUN / CREAT RATIO: 16 (ref 10–24)
BUN: 14 mg/dL (ref 8–27)
CHLORIDE: 99 mmol/L (ref 96–106)
CO2: 26 mmol/L (ref 20–29)
Calcium: 8.7 mg/dL (ref 8.6–10.2)
Creatinine, Ser: 0.86 mg/dL (ref 0.76–1.27)
GFR calc Af Amer: 104 mL/min/{1.73_m2} (ref >59)
GFR calc non Af Amer: 90 mL/min/{1.73_m2} (ref >59)
GLOBULIN, TOTAL: 2.3 g/dL (ref 1.5–4.5)
GLUCOSE: 110 mg/dL — AB (ref 65–99)
Potassium: 4.1 mmol/L (ref 3.5–5.2)
SODIUM: 140 mmol/L (ref 134–144)
Total Protein: 6.4 g/dL (ref 6.0–8.5)

## 2017-08-15 LAB — LIPID PANEL
CHOLESTEROL TOTAL: 180 mg/dL (ref 100–199)
Chol/HDL Ratio: 3.3 ratio (ref 0.0–5.0)
HDL: 54 mg/dL (ref >39)
LDL Calculated: 93 mg/dL (ref 0–99)
Triglycerides: 167 mg/dL — ABNORMAL HIGH (ref 0–149)
VLDL CHOLESTEROL CAL: 33 mg/dL (ref 5–40)

## 2017-08-17 ENCOUNTER — Telehealth: Payer: Self-pay | Admitting: Cardiovascular Disease

## 2017-08-17 ENCOUNTER — Ambulatory Visit (HOSPITAL_COMMUNITY): Payer: Medicare Other | Attending: Internal Medicine

## 2017-08-17 ENCOUNTER — Other Ambulatory Visit: Payer: Self-pay

## 2017-08-17 ENCOUNTER — Telehealth: Payer: Self-pay | Admitting: *Deleted

## 2017-08-17 DIAGNOSIS — I2510 Atherosclerotic heart disease of native coronary artery without angina pectoris: Secondary | ICD-10-CM | POA: Insufficient documentation

## 2017-08-17 DIAGNOSIS — E119 Type 2 diabetes mellitus without complications: Secondary | ICD-10-CM | POA: Diagnosis not present

## 2017-08-17 DIAGNOSIS — I252 Old myocardial infarction: Secondary | ICD-10-CM | POA: Insufficient documentation

## 2017-08-17 DIAGNOSIS — I110 Hypertensive heart disease with heart failure: Secondary | ICD-10-CM | POA: Insufficient documentation

## 2017-08-17 DIAGNOSIS — E785 Hyperlipidemia, unspecified: Secondary | ICD-10-CM

## 2017-08-17 DIAGNOSIS — Z87891 Personal history of nicotine dependence: Secondary | ICD-10-CM | POA: Diagnosis not present

## 2017-08-17 DIAGNOSIS — I5042 Chronic combined systolic (congestive) and diastolic (congestive) heart failure: Secondary | ICD-10-CM | POA: Insufficient documentation

## 2017-08-17 DIAGNOSIS — E669 Obesity, unspecified: Secondary | ICD-10-CM | POA: Insufficient documentation

## 2017-08-17 DIAGNOSIS — Z6839 Body mass index (BMI) 39.0-39.9, adult: Secondary | ICD-10-CM | POA: Insufficient documentation

## 2017-08-17 MED ORDER — EZETIMIBE 10 MG PO TABS: 10.0000 mg | ORAL_TABLET | Freq: Every day | ORAL | 11 refills | Status: DC

## 2017-08-17 MED ORDER — PERFLUTREN LIPID MICROSPHERE
1.0000 mL | INTRAVENOUS | Status: AC | PRN
  Administered 2017-08-17: 3 mL via INTRAVENOUS

## 2017-08-17 NOTE — Telephone Encounter (Signed)
Pt has been notified of echo results and recommendations for a f/u appt. Pt has been scheduled to see Richardson Dopp, PA Aug 26, 2017 @ 11:45. Pt is agreeable to plan of care. 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

## 2017-08-17 NOTE — Telephone Encounter (Signed)
I suspect that his LV function has not changed much. I have reviewed the echo from 2015 and his LVEF looked about the same. His nuclear study in 2015 showed LVEF of 32%. I think it is around 35% now. I think we should arrange a cardiac MRI to get an accurate number for his LVEF since the DOT requires it. Gerald Stabs

## 2017-08-17 NOTE — Addendum Note (Signed)
Addended by: Thompson Grayer on: 08/17/2017 01:01 PM   Modules accepted: Orders

## 2017-08-17 NOTE — Telephone Encounter (Signed)
Discussed with Enis Slipper, RN - she will be arranging for the cardiac MRI.   Burtis Junes, RN, [facility] [address] [place], [place]  [address] [phone]

## 2017-08-17 NOTE — Telephone Encounter (Signed)
Pt notified. He would like to proceed with cardiac MRI.  Will have scheduler contact him with appointment information. For now will keep appointment with Richardson Dopp, PA on 5/1 but will move back if MRI not done before this appointment.

## 2017-08-17 NOTE — Telephone Encounter (Signed)
Pt has been notified of lab results by phone with verbal understanding. Pt states he "honestly cannot remember if have ever taken Zetia". Pt is agreeable to try Zetia with repeat labs in 3 months. Zetia has been sent into [facility], FLP/LFT 11/16/17. Pt aware to come in fasting for lab work. Pt thanked me for the call today.

## 2017-08-17 NOTE — Telephone Encounter (Signed)
New message    Pt is asking for a call because he needs to talk to Dr. Angelena Form about his test. 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

## 2017-08-17 NOTE — Telephone Encounter (Signed)
-----   Message from Liliane Shi, [place] sent at 08/15/2017  7:22 AM EDT ----- Renal function, potassium, LFTs normal.  Cholesterol not bad but LDL should be < 70. PLAN:  1. Ask patient if he has ever tried Zetia (Ezetimibe).   2. If he has not tried it, start Ezetimibe 10 mg Once daily and check Lipids and LFTs in 3 mos. 3. If he has tried it and had an intolerance, refer to Lipid Clinic for management of dyslipidemia. Richardson Dopp, PA-C    08/15/2017 7:19 AM

## 2017-08-17 NOTE — Telephone Encounter (Signed)
-----   Message from Mathew Junes, NP sent at 08/17/2017 11:29 AM EDT ----- Ok to report. 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 EF now at 30 to 35%. Also with considerable stiffness - grade 2 DD. Ascending aorta mildly enlarged.  Would favor seeing Mathew Padilla again for OV (or [place]) for further discussion.

## 2017-08-19 ENCOUNTER — Encounter: Payer: Self-pay | Admitting: Cardiovascular Disease

## 2017-08-19 NOTE — Telephone Encounter (Signed)
MRI has been scheduled for May 1.  I placed call to pt to reschedule appointment with Richardson Dopp, PA.  Left message to call back

## 2017-08-24 NOTE — Telephone Encounter (Signed)
Left message to call office regarding appointment.

## 2017-08-25 ENCOUNTER — Telehealth: Payer: Self-pay | Admitting: Physician Assistant

## 2017-08-25 NOTE — Telephone Encounter (Signed)
New message ° °Pt verbalized that he is returning call for RN °

## 2017-08-26 ENCOUNTER — Ambulatory Visit: Payer: Medicare Other | Admitting: Physician Assistant

## 2017-08-26 ENCOUNTER — Ambulatory Visit (HOSPITAL_COMMUNITY)
Admission: RE | Admit: 2017-08-26 | Discharge: 2017-08-26 | Disposition: A | Discharge status: Home / Self Care | Payer: Medicare Other | Source: Ambulatory Visit | Attending: Cardiovascular Disease | Admitting: Cardiovascular Disease

## 2017-08-26 DIAGNOSIS — I2510 Atherosclerotic heart disease of native coronary artery without angina pectoris: Secondary | ICD-10-CM | POA: Diagnosis not present

## 2017-08-26 DIAGNOSIS — I5042 Chronic combined systolic (congestive) and diastolic (congestive) heart failure: Secondary | ICD-10-CM | POA: Diagnosis not present

## 2017-08-26 MED ORDER — GADOBENATE DIMEGLUMINE 529 MG/ML IV SOLN
38.0000 mL | Freq: Once | INTRAVENOUS | Status: AC | PRN
  Administered 2017-08-26: 38 mL via INTRAVENOUS

## 2017-08-26 NOTE — Telephone Encounter (Signed)
See phone note from 08/17/17 that addresses this call.

## 2017-08-26 NOTE — Telephone Encounter (Signed)
I spoke with pt and let him know he did not need to come in for appointment with Richardson Dopp, PA today at 11:45 as MRI is scheduled for 11:00 today. I told him we would call him with results and follow up would be determined at that time.

## 2017-08-28 ENCOUNTER — Encounter: Payer: Self-pay | Admitting: Cardiovascular Disease

## 2017-08-28 ENCOUNTER — Ambulatory Visit: Payer: Medicare Other | Admitting: Cardiovascular Disease

## 2017-08-28 VITALS — BP 148/80 | HR 80 | Ht 71.0 in | Wt 280.1 lb

## 2017-08-28 DIAGNOSIS — I200 Unstable angina: Secondary | ICD-10-CM

## 2017-08-28 DIAGNOSIS — E785 Hyperlipidemia, unspecified: Secondary | ICD-10-CM

## 2017-08-28 DIAGNOSIS — I255 Ischemic cardiomyopathy: Secondary | ICD-10-CM

## 2017-08-28 DIAGNOSIS — I5042 Chronic combined systolic (congestive) and diastolic (congestive) heart failure: Secondary | ICD-10-CM | POA: Diagnosis not present

## 2017-08-28 NOTE — H&P (View-Only) (Signed)
Chief Complaint  Patient presents with  . Follow-up    CAD     History of Present Illness: 67 yo male with history of CAD, cardiomyopathy, PAD, HTN, OSA who is here today for cardiac follow up. His coronary history includes MI in 1999 treated with tPA and stent RCA and then balloon angioplasty of the PDA and second marginal in March of 2000. Admitted to [facility] December 2012 to [facility] service with an ischemic right leg. He had undergone prior aortic Zenith aneurysm stent graft repair in 2007 and in 2012 was found to have occluded right limb of stent graft as well as thrombosed popliteal aneurysm right leg. He underwent femoral-femoral bypass grafting and a femoral to below knee popliteal artery bypass graft with Gore-Tex. Readmitted March 2013 and underwent ligation of left popliteal aneurysm with left above-knee to below-knee popliteal bypass with reversed greater saphenous vein by Dr. Oneida Alar. I saw him 02/13/12 and he had c/o chest pain and dyspnea. Lexiscan stress myoview on 02/18/12 showed a moderate sized scar of moderate severity involving the mid-inferior, basal inferior, mid-inferolateral and basal inferolateral segments. There was minimal reversibility. LVEF=38%. Cardiac cath 03/11/12 with moderate stenosis mid LAD and mid Circumflex with severe stenosis small OM branch (too small for PCI) and totally occluded mid RCA. We continued medical management. Carotid artery dopplers November 2013 with mild bilateral disease. He was seen in our office 04/07/14 for surgical clearance before gastric sleeve surgery. Stress myoview 04/13/14 was a high risk study with no clear ischemia, reduced LVEF. Echo 04/25/14 with LVEF=45-50%. He is intolerant to statins, beta blockers, Norvasc.   He saw Richardson Dopp, PA-C on 08/14/17 and had no complaints. 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 Echo 08/17/17 with LVEF=30-35%, trivial AI, trivial MR. Cardiac MRI on 08/26/17 with moderately dilated left ventricle with  inferolateral and inferior akinesis in the distribution of the occluded RCA. There is suggestion of viable tissue in the inferior wall. Also mid anterolateral hypokinesis. The LVEF is estimated to be 30-35%.   He is here today for follow up. The patient denies any chest pain, palpitations, lower extremity edema, orthopnea, PND, dizziness, near syncope or syncope. He does c/o more dyspnea over the past few months.   Primary Care Physician:  Sinda Du, MD  Past Medical History:  Diagnosis Date  . AAA (abdominal aortic aneurysm) ([place]) 2007   stent graft repair 12/07  . Arteriosclerotic cardiovascular disease (ASCVD)    a. MI in 1999;  b.  PTCA of PDA and 2nd marginal in 3/00;  c. 02/2012 Cath: LM nl, LAD 33m, LCX 53m, OM small, 70, RCA 99p/152m-->Med Rx;  d. 03/2014 MV: EF 32%, inflat infarct w/ mild peri-infarct isch towards apex (similar to 2013 MV); d. 03/2014 Echo: EF 45-50%, Gr 1 DD, mildly dil LA.  Marland Kitchen CAD (coronary artery disease)   . Cerebrovascular disease   . CHF (congestive heart failure) ([place])   . Cholelithiasis 11/28/2010  . Cough, persistent   . Diabetes mellitus without complication ([place])   . Diverticulitis 2006  . DVT (deep venous thrombosis) ([place])   . Elevated PSA 06/2011   a. s/p TURP 01/2014.  Marland Kitchen Gastroesophageal reflux disease    Hiatal hernia; previously treated with PPI  . Hypertension    Mild; controlled with a single agent    . Laryngeal carcinoma ([place]) 2010   resection and radiation therapy 2010-11  . Myocardial infarction ([place])   . Obesity    BMI 47; lap band  surgery 2010  . 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 Sleep apnea    BiPAP mask utilized        2 yrs sleep study    dr Redmond Pulling  . Tobacco abuse, in remission    40 pack years; discontinued in 2000    Past Surgical History:  Procedure Laterality Date  . ANTERIOR LAT LUMBAR FUSION N/A 02/22/2016   Procedure: LUMBAR TWO-LUMBAR FIVE ANTERIOR LATERAL LUMBAR INTERBODY FUSION WITH  PERCUTANEOUS PEDICLE SCREWS;  Surgeon: Kevan Ny Ditty, MD;  Location: [facility];  Service: Neurosurgery;  Laterality: N/A;  Left side approach  . CARDIAC CATHETERIZATION  2000   PCI-stent  . CATARACT EXTRACTION    . COLONOSCOPY  10/2007  . COLONOSCOPY WITH ESOPHAGOGASTRODUODENOSCOPY (EGD)  2009   RMR: He had distal esophageal erosions, patulous GE junction, erosive reflux esophagitis, hiatal hernia, lipoma on the angularis not manipulated, left-sided diverticula.  . CYSTOSCOPY  02/22/2016   Procedure: CYSTOSCOPY FLEXIBLE WITH URETHER DILATION  WITH CATHETER PLACEMENT;  Surgeon: Alexis Frock, MD;  Location: [facility];  Service: Urology;;  . EYE SURGERY    . FEMORAL-FEMORAL BYPASS GRAFT  04/18/2011   Procedure: BYPASS GRAFT FEMORAL-FEMORAL ARTERY;  Surgeon: Theotis Burrow, MD;  Location: [facility];  Service: Vascular;  Laterality: Bilateral;  Left to right femoral to femoral bypass   . FEMORAL-POPLITEAL BYPASS GRAFT  04/18/2011   Procedure: BYPASS GRAFT FEMORAL-POPLITEAL ARTERY;  Surgeon: Theotis Burrow, MD;  Location: [facility] OR;  Service: Vascular;  Laterality: Right;  Right femoral popliteal bypass with propaten gortex graft  . gastic sleeve  07/2015   gastic band removed and have sleeve  . LAPAROSCOPIC GASTRIC BANDING  2010  . LARYNX SURGERY  2010   Resection of carcinoma  . LEFT HEART CATHETERIZATION WITH CORONARY ANGIOGRAM N/A 03/10/2012   Procedure: LEFT HEART CATHETERIZATION WITH CORONARY ANGIOGRAM;  Surgeon: Burnell Blanks, MD;  Location: [facility] CATH LAB;  Service: Cardiovascular;  Laterality: N/A;  . LUMBAR PERCUTANEOUS PEDICLE SCREW 3 LEVEL N/A 02/22/2016   Procedure: LUMBAR PERCUTANEOUS PEDICLE SCREW LUMBAR TWO-LUMBAR FIVE;  Surgeon: Kevan Ny Ditty, MD;  Location: [facility];  Service: Neurosurgery;  Laterality: N/A;  . OPEN ANTERIOR SHOULDER RECONSTRUCTION  2011   Left  . PERIPHERAL ARTERIAL STENT GRAFT  2007   for abdominal aortic aneurysm  . PR VEIN BYPASS GRAFT,AORTO-FEM-POP   07-23-11   Left AK to BK popliteal BPG using rev. saphenous vein    Current Outpatient Medications  Medication Sig Dispense Refill  . acetaminophen (TYLENOL) 500 MG tablet Take 500 mg by mouth every 6 (six) hours as needed for mild pain.    Marland Kitchen aspirin EC 81 MG tablet Take 1 tablet (81 mg total) by mouth every other day.    . baclofen (LIORESAL) 10 MG tablet Take 10 mg by mouth 3 (three) times daily.    . Cholecalciferol (VITAMIN D) 2000 units CAPS Take 2,000 Units by mouth once a week.     . Esomeprazole Magnesium (NEXIUM PO) Take 1 tablet by mouth daily as needed (heartburn).    . ezetimibe (ZETIA) 10 MG tablet Take 1 tablet (10 mg total) by mouth daily. 30 tablet 11  . gabapentin (NEURONTIN) 400 MG capsule Take 400 mg by mouth daily as needed (pain).    . hydrALAZINE (APRESOLINE) 50 MG tablet Take 50 mg by mouth See admin instructions. Patient only takes when he has elevated blood pressure readings.    . irbesartan (AVAPRO) 150 MG  tablet Take 1 tablet (150 mg total) by mouth daily. Please make an overdue appt with Dr. Angelena Form, 1st Attempt 30 tablet 0  . KLOR-CON M20 20 MEQ tablet TAKE ONE TABLET BY MOUTH ONCE DAILY 90 tablet 1  . Menthol, Topical Analgesic, (ICY HOT EX) Apply 1 application topically daily as needed (back pain).    . metFORMIN (GLUCOPHAGE) 500 MG tablet Take 500 mg by mouth as needed.    . methocarbamol (ROBAXIN) 750 MG tablet Take 1 tablet (750 mg total) by mouth 4 (four) times daily. 120 tablet 2  . Multiple Vitamin (MULTIVITAMIN WITH MINERALS) TABS tablet Take 1 tablet by mouth daily.    . nitroGLYCERIN (NITROSTAT) 0.4 MG SL tablet Place 1 tablet (0.4 mg total) under the tongue every 5 (five) minutes as needed for chest pain. 25 tablet 11  . pregabalin (LYRICA) 50 MG capsule Take 2 capsules (100 mg total) by mouth 2 (two) times daily. 120 capsule 0  . torsemide (DEMADEX) 20 MG tablet Take 20 mg by mouth daily.    . Vitamin D, Ergocalciferol, (DRISDOL) 50000 units CAPS  capsule TAKE ONE CAPSULE BY MOUTH ONCE A WEEK     No current facility-administered medications for this visit.     Allergies  Allergen Reactions  . Finasteride Other (See Comments)    Unable to void Unable to void Unable to void  . Other Rash    Beta-blockers Beta-blockers Beta-blockers  . Statins Hives and Rash  . Aspirin Other (See Comments)    Cannot take daily, just once in a while Cannot take daily, just once in a while Other reaction(s): GI Upset (intolerance) Cannot take daily, just once in a while Daily dose upsets stomach    Social History   Socioeconomic History  . Marital status: Married    Spouse name: Not on file  . Number of children: 3  . Years of education: Not on file  . Highest education level: Not on file  Occupational History  . Occupation: Retired, part Doctor, general practice  Social Needs  . Financial resource strain: Not on file  . Food insecurity:    Worry: Not on file    Inability: Not on file  . Transportation needs:    Medical: Not on file    Non-medical: Not on file  Tobacco Use  . Smoking status: Former Smoker    Packs/day: 2.00    Years: 30.00    Pack years: 60.00    Types: Cigarettes    Last attempt to quit: 11/26/1997    Years since quitting: 19.7  . Smokeless tobacco: Never Used  . Tobacco comment: 40 pack years; quit 2000  Substance and Sexual Activity  . Alcohol use: No    Alcohol/week: 0.0 oz  . Drug use: No  . Sexual activity: Yes  Lifestyle  . Physical activity:    Days per week: Not on file    Minutes per session: Not on file  . Stress: Not on file  Relationships  . Social connections:    Talks on phone: Not on file    Gets together: Not on file    Attends religious service: Not on file    Active member of club or organization: Not on file    Attends meetings of clubs or organizations: Not on file    Relationship status: Not on file  . Intimate partner violence:    Fear of current or ex partner: Not on file     Emotionally abused: Not on  file    Physically abused: Not on file    Forced sexual activity: Not on file  Other Topics Concern  . Not on file  Social History Narrative   Married, past employed, does not get regular exercise.     Family History  Problem Relation Age of Onset  . Heart attack Father        deceased  . Heart failure Father   . Hyperlipidemia Father   . Hypertension Father   . Heart disease Father   . Lung cancer Mother        deceased   . Hypertension Mother   . Hyperlipidemia Mother   . Heart failure Mother   . Diabetes Mother   . Cancer Sister        vaginal  . Diabetes Sister   . Colon cancer Neg Hx     Review of Systems:  As stated in the HPI and otherwise negative.   BP (!) 148/80   Pulse 80   Ht 5\' 11"  (1.803 m)   Wt 280 lb 1.9 oz (127.1 kg)   SpO2 96%   BMI 39.07 kg/m   Physical Examination:  General: Well developed, well nourished, NAD  HEENT: OP clear, mucus membranes moist  SKIN: warm, dry. No rashes. Neuro: No focal deficits  Musculoskeletal: Muscle strength 5/5 all ext  Psychiatric: Mood and affect normal  Neck: No JVD, no carotid bruits, no thyromegaly, no lymphadenopathy.  Lungs:Clear bilaterally, no wheezes, rhonci, crackles Cardiovascular: Regular rate and rhythm. No murmurs, gallops or rubs. Abdomen:Soft. Bowel sounds present. Non-tender.  Extremities: No lower extremity edema. Pulses are 2 + in the bilateral DP/PT.  Cardiac MRI 08/26/17: Moderately dilated left ventricle with normal wall thickness. Inferolateral and inferior akinesis. Mid anterolateral hypokinesis. Unfortunately, short axis stack to quantify EF was not done (I am not sure why). Visually, EF is 30-35%. Normal right ventricular size and systolic function. Moderate left atrial enlargement, normal right atrium. Trileaflet aortic valve with mild regurgitation, no stenosis. No significant mitral regurgitation.  Delayed enhancement imaging showed < 50% wall  thickness subendocardial late gadolinium enhancement (LGE) in the inferior and inferolateral walls.  IMPRESSION: 1. Moderately dilated LV with wall motion abnormalities as noted above. In absence of a short axis stack for EF calculation, I estimated EF to be 30-35%.  2.  Normal RV size and systolic function.  3. CAD-pattern LGE in the inferior and inferolateral walls. < 50% wall thickness subendocardial LGE suggests viability.  Echo 08/17/17:  - Left ventricle: The cavity size was moderately dilated. There was   moderate concentric hypertrophy. Systolic function was moderately   to severely reduced. The estimated ejection fraction was in the   range of 30% to 35%. Global hypokinesis and inferior akinesis.   Doppler parameters are consistent with pseudonormal left   ventricular relaxation (grade 2 diastolic dysfunction). The E/e&'   ratio is >20, suggesting markedly elevated LV filling pressure. - Aortic valve: Sclerosis without stenosis. There was trivial   regurgitation. - Aorta: Ascending aortic diameter: 39 mm (S). - Ascending aorta: The ascending aorta was mildly dilated. - Mitral valve: Mildly thickened leaflets . There was trivial   regurgitation. - Left atrium: Severely dilated. - Right ventricle: The cavity size was mildly dilated. - Right atrium: The atrium was mildly dilated. - Inferior vena cava: The vessel was normal in size. The   respirophasic diameter changes were in the normal range (>= 50%),   consistent with normal central venous pressure.  Impressions:  -  Compared to a prior study in 2015, the LVEF is lower at 30-35%   with global hypokinesis and inferior akinesis, grade 2 DD and   high LV filling pressure.  Cardiac cath 03/11/12: Left main: No obstructive disease.  Left Anterior Descending Artery: Large caliber vessel that courses to the apex. The mid vessel has a 50% stenosis which was visualized in multiple views and does not appear to be flow  limiting. The first diagonal branch arises from this area of stenosis. This small to moderate sized branch has mild plaque disease. The mid and distal LAD has mild plaque disease.  Circumflex Artery: Large caliber vessel with moderate sized bifurcating first obtuse marginal branch with mild plaque disease. 50% stenosis mid AV groove Circumflex, does not appear flow limiting. The most distal obtuse marginal is small in caliber and has a 70% stenosis. This vessel is too small for PCI.  Right Coronary Artery: This appears to be at least a moderate sized vessel. The proximal vessel has 99% sub-total occlusion followed by a 100% occlusion in the mid vessel.  Left Ventricular Angiogram: Deferred.  Impression:  1. Triple vessel CAD with moderate disease in the LAD and Circumflex, chronic total occlusion of the RCA  2. Inferior wall scar on myoview c/w the chronic total occlusion of the RCA  EKG:  EKG is not ordered today. The ekg ordered today demonstrates   Recent Labs: 08/14/2017: ALT 23 08/28/2017: BUN 15; Creatinine, Ser 0.87; Hemoglobin 15.9; Platelets 165; Potassium 3.9; Sodium 143   Lipid Panel Followed in primary care   Wt Readings from Last 3 Encounters:  08/28/17 280 lb 1.9 oz (127.1 kg)  08/14/17 283 lb 6.4 oz (128.5 kg)  08/13/17 270 lb (122.5 kg)     Other studies Reviewed: Additional studies/ records that were reviewed today include: . Review of the above records demonstrates:    Assessment and Plan:   1. CAD with Unstable angina: He has no chest pain but he does have worsened dyspnea with exertion which could be an anginal equivalent. He is known to have moderate mid LAD and moderate mid Circumflex disease with chronic occlusion of mid RCA by cath November 2013. His LVEF has been between 35 and 45% over the past 5 years. Most recent assessment by echo and cardiac MRI with LVEF=30-35%. Given his dyspnea and suggestion of viable inferior wall myocardium on MRI, will plan cardiac  cath at [facility] 09/02/17 to assess options for revascularization. Risks and benefits of cath reviewed with pt. Pre-cath labs today. Will continue ASA but take daily. He does not tolerate beta blockers or statins. He is on Zetia. Will need to consider Praluent/Repatha.     2. Carotid artery disease: Mild bilateral disease by carotid dopplers November 2013.  Followed in [facility]  3. PAD: Followed in [facility]  4. Ischemic Cardiomyopathy/Chronic diastolic/systolic CHF: His LV systolic function has varied between 35 and 45% over the last few years. Most recent assessment of LV systolic function April 2595 with cardiac MRI showing LVEF=30-35%. As above, will plan cardiac cath this week. His volume status is stable on torsemide.   5. Hyperlipidemia: He is intolerant to statins. Will continue Zetia. Will consider Praluent/Repatha.   6. HTN: BP is controlled. No changes. He is intolerant of Norvasc, beta blockers.   Current medicines are reviewed at length with the patient today.  The patient does not have concerns regarding medicines.  The following changes have been made:  no change  Labs/ tests ordered today include:  Orders Placed This Encounter  Procedures  . Basic metabolic panel  . CBC with Differential/Platelet    Disposition:   FU with me after his cath.   Signed, Lauree Chandler, MD 08/30/2017 9:01 AM    [facility] [place], [place], [place]  [address] Phone: [phone]; Fax: [phone]

## 2017-08-28 NOTE — Progress Notes (Signed)
Chief Complaint  Patient presents with  . Follow-up    CAD     History of Present Illness: 67 yo male with history of CAD, cardiomyopathy, PAD, HTN, OSA who is here today for cardiac follow up. His coronary history includes MI in 1999 treated with tPA and stent RCA and then balloon angioplasty of the PDA and second marginal in March of 2000. Admitted to [facility] December 2012 to VVS service with an ischemic right leg. He had undergone prior aortic Zenith aneurysm stent graft repair in 2007 and in 2012 was found to have occluded right limb of stent graft as well as thrombosed popliteal aneurysm right leg. He underwent femoral-femoral bypass grafting and a femoral to below knee popliteal artery bypass graft with Gore-Tex. Readmitted March 2013 and underwent ligation of left popliteal aneurysm with left above-knee to below-knee popliteal bypass with reversed greater saphenous vein by Dr. Oneida Alar. I saw him 02/13/12 and he had c/o chest pain and dyspnea. Lexiscan stress myoview on 02/18/12 showed a moderate sized scar of moderate severity involving the mid-inferior, basal inferior, mid-inferolateral and basal inferolateral segments. There was minimal reversibility. LVEF=38%. Cardiac cath 03/11/12 with moderate stenosis mid LAD and mid Circumflex with severe stenosis small OM branch (too small for PCI) and totally occluded mid RCA. We continued medical management. Carotid artery dopplers November 2013 with mild bilateral disease. He was seen in our office 04/07/14 for surgical clearance before gastric sleeve surgery. Stress myoview 04/13/14 was a high risk study with no clear ischemia, reduced LVEF. Echo 04/25/14 with LVEF=45-50%. He is intolerant to statins, beta blockers, Norvasc.   He saw Richardson Dopp, PA-C on 08/14/17 and had no complaints. 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 Echo 08/17/17 with LVEF=30-35%, trivial AI, trivial MR. Cardiac MRI on 08/26/17 with moderately dilated left ventricle with  inferolateral and inferior akinesis in the distribution of the occluded RCA. There is suggestion of viable tissue in the inferior wall. Also mid anterolateral hypokinesis. The LVEF is estimated to be 30-35%.   He is here today for follow up. The patient denies any chest pain, palpitations, lower extremity edema, orthopnea, PND, dizziness, near syncope or syncope. He does c/o more dyspnea over the past few months.   Primary Care Physician:  Sinda Du, MD  Past Medical History:  Diagnosis Date  . AAA (abdominal aortic aneurysm) ([place]) 2007   stent graft repair 12/07  . Arteriosclerotic cardiovascular disease (ASCVD)    a. MI in 1999;  b.  PTCA of PDA and 2nd marginal in 3/00;  c. 02/2012 Cath: LM nl, LAD 32m, LCX 64m, OM small, 70, RCA 99p/150m-->Med Rx;  d. 03/2014 MV: EF 32%, inflat infarct w/ mild peri-infarct isch towards apex (similar to 2013 MV); d. 03/2014 Echo: EF 45-50%, Gr 1 DD, mildly dil LA.  Marland Kitchen CAD (coronary artery disease)   . Cerebrovascular disease   . CHF (congestive heart failure) ([place])   . Cholelithiasis 11/28/2010  . Cough, persistent   . Diabetes mellitus without complication ([place])   . Diverticulitis 2006  . DVT (deep venous thrombosis) ([place])   . Elevated PSA 06/2011   a. s/p TURP 01/2014.  Marland Kitchen Gastroesophageal reflux disease    Hiatal hernia; previously treated with PPI  . Hypertension    Mild; controlled with a single agent    . Laryngeal carcinoma ([place]) 2010   resection and radiation therapy 2010-11  . Myocardial infarction ([place])   . Obesity    BMI 47; lap band  surgery 2010  . 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 Sleep apnea    BiPAP mask utilized        2 yrs sleep study    dr Redmond Pulling  . Tobacco abuse, in remission    40 pack years; discontinued in 2000    Past Surgical History:  Procedure Laterality Date  . ANTERIOR LAT LUMBAR FUSION N/A 02/22/2016   Procedure: LUMBAR TWO-LUMBAR FIVE ANTERIOR LATERAL LUMBAR INTERBODY FUSION WITH  PERCUTANEOUS PEDICLE SCREWS;  Surgeon: Kevan Ny Ditty, MD;  Location: [place];  Service: Neurosurgery;  Laterality: N/A;  Left side approach  . CARDIAC CATHETERIZATION  2000   PCI-stent  . CATARACT EXTRACTION    . COLONOSCOPY  10/2007  . COLONOSCOPY WITH ESOPHAGOGASTRODUODENOSCOPY (EGD)  2009   RMR: He had distal esophageal erosions, patulous GE junction, erosive reflux esophagitis, hiatal hernia, lipoma on the angularis not manipulated, left-sided diverticula.  . CYSTOSCOPY  02/22/2016   Procedure: CYSTOSCOPY FLEXIBLE WITH URETHER DILATION  WITH CATHETER PLACEMENT;  Surgeon: Alexis Frock, MD;  Location: [place];  Service: Urology;;  . EYE SURGERY    . FEMORAL-FEMORAL BYPASS GRAFT  04/18/2011   Procedure: BYPASS GRAFT FEMORAL-FEMORAL ARTERY;  Surgeon: Theotis Burrow, MD;  Location: [facility] OR;  Service: Vascular;  Laterality: Bilateral;  Left to right femoral to femoral bypass   . FEMORAL-POPLITEAL BYPASS GRAFT  04/18/2011   Procedure: BYPASS GRAFT FEMORAL-POPLITEAL ARTERY;  Surgeon: Theotis Burrow, MD;  Location: [facility] OR;  Service: Vascular;  Laterality: Right;  Right femoral popliteal bypass with propaten gortex graft  . gastic sleeve  07/2015   gastic band removed and have sleeve  . LAPAROSCOPIC GASTRIC BANDING  2010  . LARYNX SURGERY  2010   Resection of carcinoma  . LEFT HEART CATHETERIZATION WITH CORONARY ANGIOGRAM N/A 03/10/2012   Procedure: LEFT HEART CATHETERIZATION WITH CORONARY ANGIOGRAM;  Surgeon: Burnell Blanks, MD;  Location: [facility] CATH LAB;  Service: Cardiovascular;  Laterality: N/A;  . LUMBAR PERCUTANEOUS PEDICLE SCREW 3 LEVEL N/A 02/22/2016   Procedure: LUMBAR PERCUTANEOUS PEDICLE SCREW LUMBAR TWO-LUMBAR FIVE;  Surgeon: Kevan Ny Ditty, MD;  Location: [place];  Service: Neurosurgery;  Laterality: N/A;  . OPEN ANTERIOR SHOULDER RECONSTRUCTION  2011   Left  . PERIPHERAL ARTERIAL STENT GRAFT  2007   for abdominal aortic aneurysm  . PR VEIN BYPASS GRAFT,AORTO-FEM-POP   07-23-11   Left AK to BK popliteal BPG using rev. saphenous vein    Current Outpatient Medications  Medication Sig Dispense Refill  . acetaminophen (TYLENOL) 500 MG tablet Take 500 mg by mouth every 6 (six) hours as needed for mild pain.    Marland Kitchen aspirin EC 81 MG tablet Take 1 tablet (81 mg total) by mouth every other day.    . baclofen (LIORESAL) 10 MG tablet Take 10 mg by mouth 3 (three) times daily.    . Cholecalciferol (VITAMIN D) 2000 units CAPS Take 2,000 Units by mouth once a week.     . Esomeprazole Magnesium (NEXIUM PO) Take 1 tablet by mouth daily as needed (heartburn).    . ezetimibe (ZETIA) 10 MG tablet Take 1 tablet (10 mg total) by mouth daily. 30 tablet 11  . gabapentin (NEURONTIN) 400 MG capsule Take 400 mg by mouth daily as needed (pain).    . hydrALAZINE (APRESOLINE) 50 MG tablet Take 50 mg by mouth See admin instructions. Patient only takes when he has elevated blood pressure readings.    . irbesartan (AVAPRO) 150 MG  tablet Take 1 tablet (150 mg total) by mouth daily. Please make an overdue appt with Dr. Angelena Form, 1st Attempt 30 tablet 0  . KLOR-CON M20 20 MEQ tablet TAKE ONE TABLET BY MOUTH ONCE DAILY 90 tablet 1  . Menthol, Topical Analgesic, (ICY HOT EX) Apply 1 application topically daily as needed (back pain).    . metFORMIN (GLUCOPHAGE) 500 MG tablet Take 500 mg by mouth as needed.    . methocarbamol (ROBAXIN) 750 MG tablet Take 1 tablet (750 mg total) by mouth 4 (four) times daily. 120 tablet 2  . Multiple Vitamin (MULTIVITAMIN WITH MINERALS) TABS tablet Take 1 tablet by mouth daily.    . nitroGLYCERIN (NITROSTAT) 0.4 MG SL tablet Place 1 tablet (0.4 mg total) under the tongue every 5 (five) minutes as needed for chest pain. 25 tablet 11  . pregabalin (LYRICA) 50 MG capsule Take 2 capsules (100 mg total) by mouth 2 (two) times daily. 120 capsule 0  . torsemide (DEMADEX) 20 MG tablet Take 20 mg by mouth daily.    . Vitamin D, Ergocalciferol, (DRISDOL) 50000 units CAPS  capsule TAKE ONE CAPSULE BY MOUTH ONCE A WEEK     No current facility-administered medications for this visit.     Allergies  Allergen Reactions  . Finasteride Other (See Comments)    Unable to void Unable to void Unable to void  . Other Rash    Beta-blockers Beta-blockers Beta-blockers  . Statins Hives and Rash  . Aspirin Other (See Comments)    Cannot take daily, just once in a while Cannot take daily, just once in a while Other reaction(s): GI Upset (intolerance) Cannot take daily, just once in a while Daily dose upsets stomach    Social History   Socioeconomic History  . Marital status: Married    Spouse name: Not on file  . Number of children: 3  . Years of education: Not on file  . Highest education level: Not on file  Occupational History  . Occupation: Retired, part Doctor, general practice  Social Needs  . Financial resource strain: Not on file  . Food insecurity:    Worry: Not on file    Inability: Not on file  . Transportation needs:    Medical: Not on file    Non-medical: Not on file  Tobacco Use  . Smoking status: Former Smoker    Packs/day: 2.00    Years: 30.00    Pack years: 60.00    Types: Cigarettes    Last attempt to quit: 11/26/1997    Years since quitting: 19.7  . Smokeless tobacco: Never Used  . Tobacco comment: 40 pack years; quit 2000  Substance and Sexual Activity  . Alcohol use: No    Alcohol/week: 0.0 oz  . Drug use: No  . Sexual activity: Yes  Lifestyle  . Physical activity:    Days per week: Not on file    Minutes per session: Not on file  . Stress: Not on file  Relationships  . Social connections:    Talks on phone: Not on file    Gets together: Not on file    Attends religious service: Not on file    Active member of club or organization: Not on file    Attends meetings of clubs or organizations: Not on file    Relationship status: Not on file  . Intimate partner violence:    Fear of current or ex partner: Not on file     Emotionally abused: Not on  file    Physically abused: Not on file    Forced sexual activity: Not on file  Other Topics Concern  . Not on file  Social History Narrative   Married, past employed, does not get regular exercise.     Family History  Problem Relation Age of Onset  . Heart attack Father        deceased  . Heart failure Father   . Hyperlipidemia Father   . Hypertension Father   . Heart disease Father   . Lung cancer Mother        deceased   . Hypertension Mother   . Hyperlipidemia Mother   . Heart failure Mother   . Diabetes Mother   . Cancer Sister        vaginal  . Diabetes Sister   . Colon cancer Neg Hx     Review of Systems:  As stated in the HPI and otherwise negative.   BP (!) 148/80   Pulse 80   Ht 5\' 11"  (1.803 m)   Wt 280 lb 1.9 oz (127.1 kg)   SpO2 96%   BMI 39.07 kg/m   Physical Examination:  General: Well developed, well nourished, NAD  HEENT: OP clear, mucus membranes moist  SKIN: warm, dry. No rashes. Neuro: No focal deficits  Musculoskeletal: Muscle strength 5/5 all ext  Psychiatric: Mood and affect normal  Neck: No JVD, no carotid bruits, no thyromegaly, no lymphadenopathy.  Lungs:Clear bilaterally, no wheezes, rhonci, crackles Cardiovascular: Regular rate and rhythm. No murmurs, gallops or rubs. Abdomen:Soft. Bowel sounds present. Non-tender.  Extremities: No lower extremity edema. Pulses are 2 + in the bilateral DP/PT.  Cardiac MRI 08/26/17: Moderately dilated left ventricle with normal wall thickness. Inferolateral and inferior akinesis. Mid anterolateral hypokinesis. Unfortunately, short axis stack to quantify EF was not done (I am not sure why). Visually, EF is 30-35%. Normal right ventricular size and systolic function. Moderate left atrial enlargement, normal right atrium. Trileaflet aortic valve with mild regurgitation, no stenosis. No significant mitral regurgitation.  Delayed enhancement imaging showed < 50% wall  thickness subendocardial late gadolinium enhancement (LGE) in the inferior and inferolateral walls.  IMPRESSION: 1. Moderately dilated LV with wall motion abnormalities as noted above. In absence of a short axis stack for EF calculation, I estimated EF to be 30-35%.  2.  Normal RV size and systolic function.  3. CAD-pattern LGE in the inferior and inferolateral walls. < 50% wall thickness subendocardial LGE suggests viability.  Echo 08/17/17:  - Left ventricle: The cavity size was moderately dilated. There was   moderate concentric hypertrophy. Systolic function was moderately   to severely reduced. The estimated ejection fraction was in the   range of 30% to 35%. Global hypokinesis and inferior akinesis.   Doppler parameters are consistent with pseudonormal left   ventricular relaxation (grade 2 diastolic dysfunction). The E/e&'   ratio is >20, suggesting markedly elevated LV filling pressure. - Aortic valve: Sclerosis without stenosis. There was trivial   regurgitation. - Aorta: Ascending aortic diameter: 39 mm (S). - Ascending aorta: The ascending aorta was mildly dilated. - Mitral valve: Mildly thickened leaflets . There was trivial   regurgitation. - Left atrium: Severely dilated. - Right ventricle: The cavity size was mildly dilated. - Right atrium: The atrium was mildly dilated. - Inferior vena cava: The vessel was normal in size. The   respirophasic diameter changes were in the normal range (>= 50%),   consistent with normal central venous pressure.  Impressions:  -  Compared to a prior study in 2015, the LVEF is lower at 30-35%   with global hypokinesis and inferior akinesis, grade 2 DD and   high LV filling pressure.  Cardiac cath 03/11/12: Left main: No obstructive disease.  Left Anterior Descending Artery: Large caliber vessel that courses to the apex. The mid vessel has a 50% stenosis which was visualized in multiple views and does not appear to be flow  limiting. The first diagonal branch arises from this area of stenosis. This small to moderate sized branch has mild plaque disease. The mid and distal LAD has mild plaque disease.  Circumflex Artery: Large caliber vessel with moderate sized bifurcating first obtuse marginal branch with mild plaque disease. 50% stenosis mid AV groove Circumflex, does not appear flow limiting. The most distal obtuse marginal is small in caliber and has a 70% stenosis. This vessel is too small for PCI.  Right Coronary Artery: This appears to be at least a moderate sized vessel. The proximal vessel has 99% sub-total occlusion followed by a 100% occlusion in the mid vessel.  Left Ventricular Angiogram: Deferred.  Impression:  1. Triple vessel CAD with moderate disease in the LAD and Circumflex, chronic total occlusion of the RCA  2. Inferior wall scar on myoview c/w the chronic total occlusion of the RCA  EKG:  EKG is not ordered today. The ekg ordered today demonstrates   Recent Labs: 08/14/2017: ALT 23 08/28/2017: BUN 15; Creatinine, Ser 0.87; Hemoglobin 15.9; Platelets 165; Potassium 3.9; Sodium 143   Lipid Panel Followed in primary care   Wt Readings from Last 3 Encounters:  08/28/17 280 lb 1.9 oz (127.1 kg)  08/14/17 283 lb 6.4 oz (128.5 kg)  08/13/17 270 lb (122.5 kg)     Other studies Reviewed: Additional studies/ records that were reviewed today include: . Review of the above records demonstrates:    Assessment and Plan:   1. CAD with Unstable angina: He has no chest pain but he does have worsened dyspnea with exertion which could be an anginal equivalent. He is known to have moderate mid LAD and moderate mid Circumflex disease with chronic occlusion of mid RCA by cath November 2013. His LVEF has been between 35 and 45% over the past 5 years. Most recent assessment by echo and cardiac MRI with LVEF=30-35%. Given his dyspnea and suggestion of viable inferior wall myocardium on MRI, will plan cardiac  cath at [facility] 09/02/17 to assess options for revascularization. Risks and benefits of cath reviewed with pt. Pre-cath labs today. Will continue ASA but take daily. He does not tolerate beta blockers or statins. He is on Zetia. Will need to consider Praluent/Repatha.     2. Carotid artery disease: Mild bilateral disease by carotid dopplers November 2013.  Followed in VVS  3. PAD: Followed in VVS  4. Ischemic Cardiomyopathy/Chronic diastolic/systolic CHF: His LV systolic function has varied between 35 and 45% over the last few years. Most recent assessment of LV systolic function April 6010 with cardiac MRI showing LVEF=30-35%. As above, will plan cardiac cath this week. His volume status is stable on torsemide.   5. Hyperlipidemia: He is intolerant to statins. Will continue Zetia. Will consider Praluent/Repatha.   6. HTN: BP is controlled. No changes. He is intolerant of Norvasc, beta blockers.   Current medicines are reviewed at length with the patient today.  The patient does not have concerns regarding medicines.  The following changes have been made:  no change  Labs/ tests ordered today include:  Orders Placed This Encounter  Procedures  . Basic metabolic panel  . CBC with Differential/Platelet    Disposition:   FU with me after his cath.   Signed, Lauree Chandler, MD 08/30/2017 9:01 AM    [facility] [address], [place], [place]  [address] Phone: [phone]; Fax: [phone]

## 2017-08-28 NOTE — Patient Instructions (Addendum)
Medication Instructions:  Your provider recommends that you continue on your current medications as directed. Please refer to the Current Medication list given to you today.    Labwork: TODAY! Pre-procedure labs   Testing/Procedures: Your physician has requested that you have a cardiac catheterization. Cardiac catheterization is used to diagnose and/or treat various heart conditions. Doctors may recommend this procedure for a number of different reasons. The most common reason is to evaluate chest pain. Chest pain can be a symptom of coronary artery disease (CAD), and cardiac catheterization can show whether plaque is narrowing or blocking your heart's arteries. This procedure is also used to evaluate the valves, as well as measure the blood flow and oxygen levels in different parts of your heart. For further information please visit [email or web address][employer][email or web address]. Please follow instruction sheet, as given.  Follow-Up: You have a post-cath follow-up scheduled 09/15/17 at 11:00AM with Ermalinda Barrios, PA.  Any Other Special Instructions Will Be Listed Below (If Applicable).   [place] [facility] OFFICE [address] [place] [address] Dept: [phone] Loc: [phone]  Mathew Padilla  08/28/2017  You are scheduled for a Cardiac Catheterization on Wednesday, May 8 with Dr. Lauree Chandler.  1. Please arrive at the [facility] (Main Entrance A) at [facility]: [address] [place], [place] [address] at 12:30 PM (two hours before your procedure to ensure your preparation). Free valet parking service is available.   Special note: Every effort is made to have your procedure done on time. Please understand that emergencies sometimes delay scheduled procedures.  2. Diet: Do not eat any solid foods after midnight. You may drink clear liquids until 5:00AM the morning of your procedure.  3. Labs: Today!  4.  Medication instructions in preparation for your procedure:  1) MAKE SURE TO TAKE YOUR ASPIRIN the morning of your cath.  2) Hold Torsemide the morning of your cath  3) Do not take METFORMIN the morning of your procedure  4) you may take all other meds (unless notes above) as directed with sips of water  5. Plan for one night stay--bring personal belongings. 6. Bring a current list of your medications and current insurance cards. 7. You MUST have a responsible person to drive you home. 8. Someone MUST be with you the first 24 hours after you arrive home or your discharge will be delayed. 9. Please wear clothes that are easy to get on and off and wear slip-on shoes.  Thank you for allowing [place] to care for you!   --  Invasive Cardiovascular services     If you need a refill on your cardiac medications before your next appointment, please call your pharmacy.

## 2017-08-29 LAB — CBC WITH DIFFERENTIAL/PLATELET
Basophils Absolute: 0 10*3/uL (ref 0.0–0.2)
Basos: 0 %
EOS (ABSOLUTE): 0.2 10*3/uL (ref 0.0–0.4)
Eos: 3 %
Hematocrit: 47.1 % (ref 37.5–51.0)
Hemoglobin: 15.9 g/dL (ref 13.0–17.7)
IMMATURE GRANULOCYTES: 0 %
Immature Grans (Abs): 0 10*3/uL (ref 0.0–0.1)
Lymphocytes Absolute: 1.4 10*3/uL (ref 0.7–3.1)
Lymphs: 24 %
MCH: 31.9 pg (ref 26.6–33.0)
MCHC: 33.8 g/dL (ref 31.5–35.7)
MCV: 94 fL (ref 79–97)
MONOS ABS: 0.6 10*3/uL (ref 0.1–0.9)
Monocytes: 10 %
NEUTROS PCT: 63 %
Neutrophils Absolute: 3.7 10*3/uL (ref 1.4–7.0)
PLATELETS: 165 10*3/uL (ref 150–379)
RBC: 4.99 x10E6/uL (ref 4.14–5.80)
RDW: 13.9 % (ref 12.3–15.4)
WBC: 5.9 10*3/uL (ref 3.4–10.8)

## 2017-08-29 LAB — BASIC METABOLIC PANEL
BUN/Creatinine Ratio: 17 (ref 10–24)
BUN: 15 mg/dL (ref 8–27)
CHLORIDE: 100 mmol/L (ref 96–106)
CO2: 25 mmol/L (ref 20–29)
Calcium: 9.7 mg/dL (ref 8.6–10.2)
Creatinine, Ser: 0.87 mg/dL (ref 0.76–1.27)
GFR calc Af Amer: 103 mL/min/{1.73_m2} (ref >59)
GFR, EST NON AFRICAN AMERICAN: 89 mL/min/{1.73_m2} (ref >59)
GLUCOSE: 121 mg/dL — AB (ref 65–99)
POTASSIUM: 3.9 mmol/L (ref 3.5–5.2)
SODIUM: 143 mmol/L (ref 134–144)

## 2017-09-01 ENCOUNTER — Telehealth: Payer: Self-pay | Admitting: *Deleted

## 2017-09-01 NOTE — Telephone Encounter (Signed)
Pt contacted pre-catheterization scheduled at [facility] for: Wednesday Sep 02, 2017 2:30 PM Verified arrival time and place: [place] Entrance A at: 12:30 PM  No solid food after midnight prior to cath, clear liquids until 5 AM day of procedure. Verified allergies in Epic   Hold: Metformin 09/02/17 and 48 hours post cath Torsemide AM of cath. KCL AM of cath.  Except hold medications AM meds can be  taken pre-cath with sip of water including: ASA 81 mg  Confirmed patient has responsible person to drive home post procedure and observe patient for 24 hours: yes

## 2017-09-02 ENCOUNTER — Encounter (HOSPITAL_COMMUNITY)
Admission: RE | Disposition: A | Discharge status: Home / Self Care | Payer: Self-pay | Source: Ambulatory Visit | Attending: Cardiovascular Disease

## 2017-09-02 ENCOUNTER — Ambulatory Visit (HOSPITAL_COMMUNITY)
Admission: RE | Admit: 2017-09-02 | Discharge: 2017-09-02 | Disposition: A | Discharge status: Home / Self Care | Payer: Medicare Other | Source: Ambulatory Visit | Attending: Cardiovascular Disease | Admitting: Cardiovascular Disease

## 2017-09-02 DIAGNOSIS — Z7982 Long term (current) use of aspirin: Secondary | ICD-10-CM | POA: Insufficient documentation

## 2017-09-02 DIAGNOSIS — G4733 Obstructive sleep apnea (adult) (pediatric): Secondary | ICD-10-CM | POA: Insufficient documentation

## 2017-09-02 DIAGNOSIS — I200 Unstable angina: Secondary | ICD-10-CM

## 2017-09-02 DIAGNOSIS — E785 Hyperlipidemia, unspecified: Secondary | ICD-10-CM | POA: Diagnosis not present

## 2017-09-02 DIAGNOSIS — Z955 Presence of coronary angioplasty implant and graft: Secondary | ICD-10-CM | POA: Diagnosis not present

## 2017-09-02 DIAGNOSIS — Z9849 Cataract extraction status, unspecified eye: Secondary | ICD-10-CM | POA: Insufficient documentation

## 2017-09-02 DIAGNOSIS — Z87891 Personal history of nicotine dependence: Secondary | ICD-10-CM | POA: Insufficient documentation

## 2017-09-02 DIAGNOSIS — Z86718 Personal history of other venous thrombosis and embolism: Secondary | ICD-10-CM | POA: Insufficient documentation

## 2017-09-02 DIAGNOSIS — I429 Cardiomyopathy, unspecified: Secondary | ICD-10-CM | POA: Insufficient documentation

## 2017-09-02 DIAGNOSIS — I25118 Atherosclerotic heart disease of native coronary artery with other forms of angina pectoris: Secondary | ICD-10-CM | POA: Diagnosis not present

## 2017-09-02 DIAGNOSIS — Z8249 Family history of ischemic heart disease and other diseases of the circulatory system: Secondary | ICD-10-CM | POA: Diagnosis not present

## 2017-09-02 DIAGNOSIS — Z8679 Personal history of other diseases of the circulatory system: Secondary | ICD-10-CM | POA: Insufficient documentation

## 2017-09-02 DIAGNOSIS — I25110 Atherosclerotic heart disease of native coronary artery with unstable angina pectoris: Secondary | ICD-10-CM | POA: Diagnosis not present

## 2017-09-02 DIAGNOSIS — Z951 Presence of aortocoronary bypass graft: Secondary | ICD-10-CM | POA: Insufficient documentation

## 2017-09-02 DIAGNOSIS — Z6839 Body mass index (BMI) 39.0-39.9, adult: Secondary | ICD-10-CM | POA: Insufficient documentation

## 2017-09-02 DIAGNOSIS — E669 Obesity, unspecified: Secondary | ICD-10-CM | POA: Insufficient documentation

## 2017-09-02 DIAGNOSIS — Z9884 Bariatric surgery status: Secondary | ICD-10-CM | POA: Insufficient documentation

## 2017-09-02 DIAGNOSIS — Z7984 Long term (current) use of oral hypoglycemic drugs: Secondary | ICD-10-CM | POA: Diagnosis not present

## 2017-09-02 DIAGNOSIS — Z8521 Personal history of malignant neoplasm of larynx: Secondary | ICD-10-CM | POA: Insufficient documentation

## 2017-09-02 DIAGNOSIS — I110 Hypertensive heart disease with heart failure: Secondary | ICD-10-CM | POA: Insufficient documentation

## 2017-09-02 DIAGNOSIS — Z833 Family history of diabetes mellitus: Secondary | ICD-10-CM | POA: Insufficient documentation

## 2017-09-02 DIAGNOSIS — Z981 Arthrodesis status: Secondary | ICD-10-CM | POA: Diagnosis not present

## 2017-09-02 DIAGNOSIS — E119 Type 2 diabetes mellitus without complications: Secondary | ICD-10-CM | POA: Diagnosis not present

## 2017-09-02 DIAGNOSIS — I679 Cerebrovascular disease, unspecified: Secondary | ICD-10-CM | POA: Diagnosis not present

## 2017-09-02 DIAGNOSIS — I2584 Coronary atherosclerosis due to calcified coronary lesion: Secondary | ICD-10-CM | POA: Insufficient documentation

## 2017-09-02 DIAGNOSIS — I208 Other forms of angina pectoris: Secondary | ICD-10-CM | POA: Diagnosis present

## 2017-09-02 DIAGNOSIS — I5042 Chronic combined systolic (congestive) and diastolic (congestive) heart failure: Secondary | ICD-10-CM | POA: Insufficient documentation

## 2017-09-02 DIAGNOSIS — K210 Gastro-esophageal reflux disease with esophagitis: Secondary | ICD-10-CM | POA: Diagnosis not present

## 2017-09-02 DIAGNOSIS — Z888 Allergy status to other drugs, medicaments and biological substances status: Secondary | ICD-10-CM | POA: Insufficient documentation

## 2017-09-02 DIAGNOSIS — I252 Old myocardial infarction: Secondary | ICD-10-CM | POA: Diagnosis not present

## 2017-09-02 DIAGNOSIS — Z79899 Other long term (current) drug therapy: Secondary | ICD-10-CM | POA: Insufficient documentation

## 2017-09-02 DIAGNOSIS — Z9889 Other specified postprocedural states: Secondary | ICD-10-CM | POA: Diagnosis not present

## 2017-09-02 DIAGNOSIS — Z886 Allergy status to analgesic agent status: Secondary | ICD-10-CM | POA: Insufficient documentation

## 2017-09-02 HISTORY — PX: LEFT HEART CATH AND CORONARY ANGIOGRAPHY: CATH118249

## 2017-09-02 LAB — GLUCOSE, CAPILLARY: Glucose-Capillary: 136 mg/dL — ABNORMAL HIGH (ref 65–99)

## 2017-09-02 SURGERY — LEFT HEART CATH AND CORONARY ANGIOGRAPHY
Anesthesia: LOCAL

## 2017-09-02 MED ORDER — SODIUM CHLORIDE 0.9 % IV SOLN: 250.0000 mL | INTRAVENOUS | Status: DC | PRN

## 2017-09-02 MED ORDER — FENTANYL CITRATE (PF) 100 MCG/2ML IJ SOLN
INTRAMUSCULAR | Status: DC | PRN
  Administered 2017-09-02: 50 ug via INTRAVENOUS

## 2017-09-02 MED ORDER — ACETAMINOPHEN 325 MG PO TABS: 650.0000 mg | ORAL_TABLET | ORAL | Status: DC | PRN

## 2017-09-02 MED ORDER — HEPARIN (PORCINE) IN NACL 1000-0.9 UT/500ML-% IV SOLN
INTRAVENOUS | Status: AC
  Filled 2017-09-02: qty 1000

## 2017-09-02 MED ORDER — LIDOCAINE HCL (PF) 1 % IJ SOLN
INTRAMUSCULAR | Status: DC | PRN
  Administered 2017-09-02: 2 mL

## 2017-09-02 MED ORDER — SODIUM CHLORIDE 0.9% FLUSH: 3.0000 mL | Freq: Two times a day (BID) | INTRAVENOUS | Status: DC

## 2017-09-02 MED ORDER — SODIUM CHLORIDE 0.9% FLUSH: 3.0000 mL | INTRAVENOUS | Status: DC | PRN

## 2017-09-02 MED ORDER — IOHEXOL 350 MG/ML SOLN
INTRAVENOUS | Status: DC | PRN
  Administered 2017-09-02: 80 mL via INTRACARDIAC

## 2017-09-02 MED ORDER — LIDOCAINE HCL (PF) 1 % IJ SOLN
INTRAMUSCULAR | Status: AC
  Filled 2017-09-02: qty 30

## 2017-09-02 MED ORDER — FENTANYL CITRATE (PF) 100 MCG/2ML IJ SOLN
INTRAMUSCULAR | Status: AC
  Filled 2017-09-02: qty 2

## 2017-09-02 MED ORDER — SODIUM CHLORIDE 0.9 % IV SOLN
INTRAVENOUS | Status: AC
  Administered 2017-09-02: 13:00:00 via INTRAVENOUS

## 2017-09-02 MED ORDER — VERAPAMIL HCL 2.5 MG/ML IV SOLN
INTRAVENOUS | Status: DC | PRN
  Administered 2017-09-02: 10 mL via INTRA_ARTERIAL

## 2017-09-02 MED ORDER — HEPARIN SODIUM (PORCINE) 1000 UNIT/ML IJ SOLN
INTRAMUSCULAR | Status: DC | PRN
  Administered 2017-09-02: 6000 [IU] via INTRAVENOUS

## 2017-09-02 MED ORDER — ASPIRIN 81 MG PO CHEW: 81.0000 mg | CHEWABLE_TABLET | ORAL | Status: DC

## 2017-09-02 MED ORDER — MIDAZOLAM HCL 2 MG/2ML IJ SOLN
INTRAMUSCULAR | Status: DC | PRN
  Administered 2017-09-02: 2 mg via INTRAVENOUS

## 2017-09-02 MED ORDER — SODIUM CHLORIDE 0.9 % IV SOLN: INTRAVENOUS | Status: AC

## 2017-09-02 MED ORDER — ONDANSETRON HCL 4 MG/2ML IJ SOLN: 4.0000 mg | Freq: Four times a day (QID) | INTRAMUSCULAR | Status: DC | PRN

## 2017-09-02 MED ORDER — HEPARIN (PORCINE) IN NACL 2-0.9 UNITS/ML
INTRAMUSCULAR | Status: AC | PRN
  Administered 2017-09-02 (×2): 500 mL

## 2017-09-02 MED ORDER — VERAPAMIL HCL 2.5 MG/ML IV SOLN
INTRAVENOUS | Status: AC
  Filled 2017-09-02: qty 2

## 2017-09-02 MED ORDER — MIDAZOLAM HCL 2 MG/2ML IJ SOLN
INTRAMUSCULAR | Status: AC
  Filled 2017-09-02: qty 2

## 2017-09-02 MED ORDER — HEPARIN SODIUM (PORCINE) 1000 UNIT/ML IJ SOLN
INTRAMUSCULAR | Status: AC
  Filled 2017-09-02: qty 1

## 2017-09-02 MED ORDER — ASPIRIN 81 MG PO CHEW
CHEWABLE_TABLET | ORAL | Status: AC
  Administered 2017-09-02: 81 mg
  Filled 2017-09-02: qty 1

## 2017-09-02 SURGICAL SUPPLY — 11 items
CATH INFINITI 5 FR JL3.5 (CATHETERS) ×1 IMPLANT
CATH INFINITI 5FR ANG PIGTAIL (CATHETERS) ×1 IMPLANT
CATH INFINITI JR4 5F (CATHETERS) ×1 IMPLANT
DEVICE RAD COMP TR BAND LRG (VASCULAR PRODUCTS) ×1 IMPLANT
GLIDESHEATH SLEND SS 6F .021 (SHEATH) ×1 IMPLANT
GUIDEWIRE INQWIRE 1.5J.035X260 (WIRE) IMPLANT
INQWIRE 1.5J .035X260CM (WIRE) ×2
KIT HEART LEFT (KITS) ×2 IMPLANT
PACK CARDIAC CATHETERIZATION (CUSTOM PROCEDURE TRAY) ×2 IMPLANT
TRANSDUCER W/STOPCOCK (MISCELLANEOUS) ×2 IMPLANT
TUBING CIL FLEX 10 FLL-RA (TUBING) ×2 IMPLANT

## 2017-09-02 NOTE — Interval H&P Note (Signed)
History and Physical Interval Note:  09/02/2017 2:05 PM  Mathew Padilla  has presented today for cardiac cath with the diagnosis of unstable angina  The various methods of treatment have been discussed with the patient and family. After consideration of risks, benefits and other options for treatment, the patient has consented to  Procedure(s): LEFT HEART CATH AND CORONARY ANGIOGRAPHY (N/A) as a surgical intervention .  The patient's history has been reviewed, patient examined, no change in status, stable for surgery.  I have reviewed the patient's chart and labs.  Questions were answered to the patient's satisfaction.    Cath Lab Visit (complete for each Cath Lab visit)  Clinical Evaluation Leading to the Procedure:   ACS: No.  Non-ACS:    Anginal Classification: CCS III  Anti-ischemic medical therapy: No Therapy  Non-Invasive Test Results: No non-invasive testing performed  Prior CABG: No previous CABG        Lauree Chandler

## 2017-09-02 NOTE — Discharge Instructions (Signed)
° °  HOLD METFORMIN FOR 48 HOURS  DRINK PLENTY OF FLUIDS FOR THE NEXT 3 DAYS.  Radial Site Care Refer to this sheet in the next few weeks. These instructions provide you with information about caring for yourself after your procedure. Your health care provider may also give you more specific instructions. Your treatment has been planned according to current medical practices, but problems sometimes occur. Call your health care provider if you have any problems or questions after your procedure. What can I expect after the procedure? After your procedure, it is typical to have the following:  Bruising at the radial site that usually fades within 1-2 weeks.  Blood collecting in the tissue (hematoma) that may be painful to the touch. It should usually decrease in size and tenderness within 1-2 weeks.  Follow these instructions at home:  Take medicines only as directed by your health care provider.  You may shower 24-48 hours after the procedure or as directed by your health care provider. Remove the bandage (dressing) and gently wash the site with plain soap and water. Pat the area dry with a clean towel. Do not rub the site, because this may cause bleeding.  Do not take baths, swim, or use a hot tub until your health care provider approves.  Check your insertion site every day for redness, swelling, or drainage.  Do not apply powder or lotion to the site.  Do not flex or bend the affected arm for 24 hours or as directed by your health care provider.  Do not push or pull heavy objects with the affected arm for 24 hours or as directed by your health care provider.  Do not lift over 10 lb (4.5 kg) for 5 days after your procedure or as directed by your health care provider.  Ask your health care provider when it is okay to: ? Return to work or school. ? Resume usual physical activities or sports. ? Resume sexual activity.  Do not drive home if you are discharged the same day as the  procedure. Have someone else drive you.  You may drive 24 hours after the procedure unless otherwise instructed by your health care provider.  Do not operate machinery or power tools for 24 hours after the procedure.  If your procedure was done as an outpatient procedure, which means that you went home the same day as your procedure, a responsible adult should be with you for the first 24 hours after you arrive home.  Keep all follow-up visits as directed by your health care provider. This is important. Contact a health care provider if:  You have a fever.  You have chills.  You have increased bleeding from the radial site. Hold pressure on the site. Get help right away if:  You have unusual pain at the radial site.  You have redness, warmth, or swelling at the radial site.  You have drainage (other than a small amount of blood on the dressing) from the radial site.  The radial site is bleeding, and the bleeding does not stop after 30 minutes of holding steady pressure on the site.  Your arm or hand becomes pale, cool, tingly, or numb. This information is not intended to replace advice given to you by your health care provider. Make sure you discuss any questions you have with your health care provider. Document Released: 05/17/2010 Document Revised: 09/20/2015 Document Reviewed: 10/31/2013 Elsevier Interactive Patient Education  2018 [employer].

## 2017-09-03 ENCOUNTER — Encounter (HOSPITAL_COMMUNITY): Payer: Self-pay | Admitting: Cardiovascular Disease

## 2017-09-03 MED FILL — Heparin Sod (Porcine)-NaCl IV Soln 1000 Unit/500ML-0.9%: INTRAVENOUS | Qty: 1000 | Status: AC

## 2017-09-15 ENCOUNTER — Ambulatory Visit: Payer: Medicare Other | Admitting: Physician Assistant

## 2017-09-16 DIAGNOSIS — E119 Type 2 diabetes mellitus without complications: Secondary | ICD-10-CM | POA: Diagnosis not present

## 2017-09-17 DIAGNOSIS — E119 Type 2 diabetes mellitus without complications: Secondary | ICD-10-CM | POA: Diagnosis not present

## 2017-09-23 ENCOUNTER — Ambulatory Visit: Payer: Medicare Other | Admitting: Cardiovascular Disease

## 2017-09-30 ENCOUNTER — Ambulatory Visit: Payer: Medicare Other | Admitting: Physician Assistant

## 2017-10-15 DIAGNOSIS — I2510 Atherosclerotic heart disease of native coronary artery without angina pectoris: Secondary | ICD-10-CM | POA: Diagnosis not present

## 2017-10-15 DIAGNOSIS — G4733 Obstructive sleep apnea (adult) (pediatric): Secondary | ICD-10-CM | POA: Diagnosis not present

## 2017-10-15 DIAGNOSIS — N401 Enlarged prostate with lower urinary tract symptoms: Secondary | ICD-10-CM | POA: Diagnosis not present

## 2017-10-15 DIAGNOSIS — Z125 Encounter for screening for malignant neoplasm of prostate: Secondary | ICD-10-CM | POA: Diagnosis not present

## 2017-10-15 DIAGNOSIS — I5042 Chronic combined systolic (congestive) and diastolic (congestive) heart failure: Secondary | ICD-10-CM | POA: Diagnosis not present

## 2017-11-16 ENCOUNTER — Other Ambulatory Visit: Payer: Medicare Other

## 2017-11-17 ENCOUNTER — Other Ambulatory Visit: Payer: Medicare Other

## 2017-11-17 DIAGNOSIS — E785 Hyperlipidemia, unspecified: Secondary | ICD-10-CM

## 2017-11-17 LAB — LIPID PANEL
Chol/HDL Ratio: 3 ratio (ref 0.0–5.0)
Cholesterol, Total: 157 mg/dL (ref 100–199)
HDL: 53 mg/dL (ref >39)
LDL CALC: 77 mg/dL (ref 0–99)
Triglycerides: 135 mg/dL (ref 0–149)
VLDL CHOLESTEROL CAL: 27 mg/dL (ref 5–40)

## 2017-11-17 LAB — HEPATIC FUNCTION PANEL
ALBUMIN: 4.2 g/dL (ref 3.6–4.8)
ALT: 24 IU/L (ref 0–44)
AST: 17 IU/L (ref 0–40)
Alkaline Phosphatase: 89 IU/L (ref 39–117)
Bilirubin Total: 0.5 mg/dL (ref 0.0–1.2)
Bilirubin, Direct: 0.12 mg/dL (ref 0.00–0.40)
Total Protein: 6.8 g/dL (ref 6.0–8.5)

## 2017-11-18 ENCOUNTER — Telehealth: Payer: Self-pay | Admitting: *Deleted

## 2017-11-18 NOTE — Telephone Encounter (Signed)
DPR ok to leave message on machine. Lab work good. Continue on current medications including the Zetia. If any questions please call [phone].

## 2017-11-18 NOTE — Telephone Encounter (Signed)
-----   Message from Liliane Shi, [place] sent at 11/18/2017  1:58 PM EDT ----- Lipids close to goal.  LFTs normal. Continue current therapy with Zetia. Richardson Dopp, PA-C    11/18/2017 1:57 PM

## 2017-12-03 DIAGNOSIS — M5416 Radiculopathy, lumbar region: Secondary | ICD-10-CM | POA: Diagnosis not present

## 2017-12-25 ENCOUNTER — Encounter: Payer: Self-pay | Admitting: Physician Assistant

## 2017-12-29 ENCOUNTER — Encounter: Payer: Self-pay | Admitting: Physician Assistant

## 2017-12-29 ENCOUNTER — Ambulatory Visit: Payer: Medicare Other | Admitting: Physician Assistant

## 2017-12-29 VITALS — BP 124/76 | HR 76 | Ht 71.0 in | Wt 298.8 lb

## 2017-12-29 DIAGNOSIS — E785 Hyperlipidemia, unspecified: Secondary | ICD-10-CM | POA: Diagnosis not present

## 2017-12-29 DIAGNOSIS — I2510 Atherosclerotic heart disease of native coronary artery without angina pectoris: Secondary | ICD-10-CM | POA: Diagnosis not present

## 2017-12-29 DIAGNOSIS — I5042 Chronic combined systolic (congestive) and diastolic (congestive) heart failure: Secondary | ICD-10-CM | POA: Diagnosis not present

## 2017-12-29 DIAGNOSIS — I10 Essential (primary) hypertension: Secondary | ICD-10-CM

## 2017-12-29 MED ORDER — CARVEDILOL 6.25 MG PO TABS: 6.2500 mg | ORAL_TABLET | Freq: Two times a day (BID) | ORAL | 3 refills | Status: AC

## 2017-12-29 MED ORDER — IRBESARTAN 150 MG PO TABS: 150.0000 mg | ORAL_TABLET | Freq: Every day | ORAL | 3 refills | Status: DC

## 2017-12-29 NOTE — Progress Notes (Signed)
Cardiology Office Note:    Date:  12/29/2017   ID:  [name], DOB 1951/01/12, MRN [id number]  PCP:  Sinda Du, MD  Cardiologist:  Lauree Chandler, MD   Referring MD: Sinda Du, MD   Chief Complaint  Patient presents with  . Follow-up    CHF, CAD    History of Present Illness:    Mathew Padilla is a 67 y.o. male with coronary artery disease status post myocardial infarction in 1999 treated with tissue plasminogen activator and stenting to the RCA, POBA to the PDA in 06/1998, peripheral arterial disease, hypertension, sleep apnea.  Cardiac catheterization in 2013 demonstrated moderate LAD and circumflex stenosis, severe stenosis in an OM branch that was too small for PCI and an occluded RCA.  He was treated medically.  EF was 45-50 by echocardiogram.  Echocardiogram in April 2019 demonstrated worsening LV function with EF of 30-35%.  Cardiac MRI confirmed EF 30-35%.  Cardiac catheterization in May 2019 demonstrated chronically occluded mid LAD and chronically occluded proximal RCA.  There were no good targets for PCI medical therapy was continued.    Mathew Padilla returns for follow-up.  He has not been seen since his heart catheterization in May 2019.  He is here today with his wife.  He denies chest discomfort.  He notes dyspnea with more moderate to extreme activities.  He denies orthopnea, paroxysmal nocturnal dyspnea or significant lower extremity swelling.  He denies syncope.  His notes during his cardiac catheterization indicated that carvedilol was to be started.  The patient notes that he is still taking this medication.  We no longer have a irbesartan on his medication list.  He is not sure, but thinks he is still taking this medication.  Prior CV studies:   The following studies were reviewed today:  Cardiac catheterization 09/02/2017 LAD proximal 60, mid 100 CTO; D1 ostial 50 LCx proximal 40, distal 80; OM1 ostial 40, OM 2 ostial 40 RCA proximal  100-CTO    Cardiac MRI 08/26/17 IMPRESSION: 1. Moderately dilated LV with wall motion abnormalities as noted above. In absence of a short axis stack for EF calculation, I estimated EF to be 30-35%. 2.  Normal RV size and systolic function. 3. CAD-pattern LGE in the inferior and inferolateral walls. < 50% wall thickness subendocardial LGE suggests viability.  Echo 08/17/2017 Moderate concentric LVH, EF 30-35, global HK and inferior akinesis, grade 2 diastolic dysfunction, aortic sclerosis without stenosis, mildly dilated ascending aorta (39 mm), trivial MR, severe LAE, mild RAE  GXT 09/01/16  Exercise stress test: Clinically negative for ischemia Electrically negatvie for ischemia Poor exercise tolerance. PVCs/NSVT noted in recovery.  Echo 04/25/14 EF 62-83, grade 1 diastolic dysfunction, mild LAE  Nuclear stress test 04/20/14 High risk stress nuclear study with a large, severe, partially reversible inferior lateral defect consistent with prior infarct and mild peri-infarct ischemia towards the apex. Study high risk due to reduced LV function.  LV Ejection Fraction: 32%.  Cardiac catheterization 03/10/12 LM okay LAD mid 50 LCx mid AV groove 50; distal OM 70 (too small for PCI) RCA proximal 99, 100-CTO  Nuclear stress test 02/18/12 Abnormal stress nuclear study.There is a moderate sized scar of moderate severity involving the mid-inferior, basal inferior, mid-inferolateral and basal inferolateral segments. There is minimal reversibility.  LV Ejection Fraction: 38%.  Carotid US 09/29/2001 IMPRESSION PLAQUE FORMATION IN BOTH CAROTID BULBS AND BOTH PROXIMAL INTERNAL CAROTID ARTERIES WITHOUT SIGNIFICANT LUMINAL NARROWING OR FLOW REDUCTION.   Past Medical History:  Diagnosis  Date  . AAA (abdominal aortic aneurysm) ([place]) 2007   stent graft repair 12/07  . Arteriosclerotic cardiovascular disease (ASCVD)    a. MI in 1999;  b.  PTCA of PDA and 2nd marginal in 3/00;  c. 02/2012 Cath:  LM nl, LAD 4m, LCX 35m, OM small, 70, RCA 99p/133m-->Med Rx;  d. 03/2014 MV: EF 32%, inflat infarct w/ mild peri-infarct isch towards apex (similar to 2013 MV); d. 03/2014 Echo: EF 45-50%, Gr 1 DD, mildly dil LA.  Marland Kitchen CAD (coronary artery disease)   . Cerebrovascular disease   . CHF (congestive heart failure) ([place])   . Cholelithiasis 11/28/2010  . Cough, persistent   . Diabetes mellitus without complication ([place])   . Diverticulitis 2006  . DVT (deep venous thrombosis) ([place])   . Elevated PSA 06/2011   a. s/p TURP 01/2014.  Marland Kitchen Gastroesophageal reflux disease    Hiatal hernia; previously treated with PPI  . Hypertension    Mild; controlled with a single agent    . Laryngeal carcinoma ([place]) 2010   resection and radiation therapy 2010-11  . Myocardial infarction ([place])   . Obesity    BMI 47; lap band surgery 2010  . Popliteal aneurysm ([place])    popliteal bypass-2013  . Sleep apnea    BiPAP mask utilized        2 yrs sleep study    dr Redmond Pulling  . Tobacco abuse, in remission    40 pack years; discontinued in 2000   Surgical Hx: The patient  has a past surgical history that includes Peripheral arterial stent graft (2007); Larynx surgery (2010); Open anterior shoulder reconstruction (2011); Laparoscopic gastric banding (2010); Eye surgery; Cataract extraction; Colonoscopy (10/2007); Femoral-femoral Bypass Graft (04/18/2011); Femoral-popliteal Bypass Graft (04/18/2011); Cardiac catheterization (2000); pr vein bypass graft,aorto-fem-pop (07-23-11); Colonoscopy with esophagogastroduodenoscopy (egd) (2009); left heart catheterization with coronary angiogram (N/A, 03/10/2012); gastic sleeve (07/2015); Anterior lat lumbar fusion (N/A, 02/22/2016); Lumbar percutaneous pedicle screw 3 level (N/A, 02/22/2016); Cystoscopy (02/22/2016); and LEFT HEART CATH AND CORONARY ANGIOGRAPHY (N/A, 09/02/2017).   Current Medications: Current Meds  Medication Sig  . acetaminophen (TYLENOL) 500 MG tablet Take 500 mg by mouth  every 6 (six) hours as needed for mild pain.  Marland Kitchen aspirin EC 81 MG tablet Take 1 tablet (81 mg total) by mouth every other day.  . Cholecalciferol (VITAMIN D) 2000 units CAPS Take 2,000 Units by mouth daily.   Marland Kitchen ezetimibe (ZETIA) 10 MG tablet Take 10 mg by mouth daily.  Marland Kitchen gabapentin (NEURONTIN) 400 MG capsule Take 400 mg by mouth at bedtime.   . hydrALAZINE (APRESOLINE) 50 MG tablet Take 50 mg by mouth See admin instructions. Patient only takes when he has elevated blood pressure readings.  . metFORMIN (GLUCOPHAGE) 500 MG tablet Take 500 mg by mouth daily as needed (High Blood sugar above 140).   . Multiple Vitamin (MULTIVITAMIN WITH MINERALS) TABS tablet Take 1 tablet by mouth daily.  . nitroGLYCERIN (NITROSTAT) 0.4 MG SL tablet Place 0.4 mg under the tongue every 5 (five) minutes as needed for chest pain.  . pantoprazole (PROTONIX) 40 MG tablet Take 40 mg by mouth every other day.  . potassium chloride SA (KLOR-CON M20) 20 MEQ tablet Take 20 mEq by mouth every other day.  . torsemide (DEMADEX) 20 MG tablet Take 20 mg by mouth every other day.   . Vitamin D, Ergocalciferol, (DRISDOL) 50000 units CAPS capsule TAKE ONE CAPSULE BY MOUTH ONCE A WEEK ON TUESDAYS  . [DISCONTINUED] KLOR-CON M20 20  MEQ tablet TAKE ONE TABLET BY MOUTH ONCE DAILY (Patient taking differently: TAKE ONE TABLET BY MOUTH ONCE EVERY OTHER DAY)     Allergies:   Finasteride; Other; Statins; and Aspirin   Social History   Tobacco Use  . Smoking status: Former Smoker    Packs/day: 2.00    Years: 30.00    Pack years: 60.00    Types: Cigarettes    Last attempt to quit: 11/26/1997    Years since quitting: 20.1  . Smokeless tobacco: Never Used  . Tobacco comment: 40 pack years; quit 2000  Substance Use Topics  . Alcohol use: No    Alcohol/week: 0.0 standard drinks  . Drug use: No     Family Hx: The patient's family history includes Cancer in his sister; Diabetes in his mother and sister; Heart attack in his father; Heart  disease in his father; Heart failure in his father and mother; Hyperlipidemia in his father and mother; Hypertension in his father and mother; Lung cancer in his mother. There is no history of Colon cancer.  ROS:   Please see the history of present illness.    Review of Systems  Constitution: Positive for decreased appetite and diaphoresis.  Cardiovascular: Positive for leg swelling.  Gastrointestinal: Positive for constipation.   All other systems reviewed and are negative.   EKGs/Labs/Other Test Reviewed:    EKG:  EKG is  ordered today.  The ekg ordered today demonstrates sinus rhythm, heart rate 76, normal axis, frequent PVCs, QTC 474  Recent Labs: 08/28/2017: BUN 15; Creatinine, Ser 0.87; Hemoglobin 15.9; Platelets 165; Potassium 3.9; Sodium 143 11/17/2017: ALT 24   Recent Lipid Panel Lab Results  Component Value Date/Time   CHOL 157 11/17/2017 09:54 AM   TRIG 135 11/17/2017 09:54 AM   HDL 53 11/17/2017 09:54 AM   CHOLHDL 3.0 11/17/2017 09:54 AM   CHOLHDL 4 11/01/2012 11:20 AM   LDLCALC 77 11/17/2017 09:54 AM   LDLDIRECT 84.7 11/01/2012 11:20 AM    Physical Exam:    VS:  BP 124/76   Pulse 76   Ht 5\' 11"  (1.803 m)   Wt 298 lb 12.8 oz (135.5 kg)   SpO2 97%   BMI 41.67 kg/m     Wt Readings from Last 3 Encounters:  12/29/17 298 lb 12.8 oz (135.5 kg)  09/02/17 280 lb (127 kg)  08/28/17 280 lb 1.9 oz (127.1 kg)     Physical Exam  Constitutional: He is oriented to person, place, and time. He appears well-developed and well-nourished. No distress.  HENT:  Head: Normocephalic and atraumatic.  Eyes: No scleral icterus.  Neck: No JVD present. No thyromegaly present.  Cardiovascular: Normal rate and regular rhythm.  No murmur heard. Pulmonary/Chest: Effort normal. He has no wheezes. He has no rales.  Abdominal: Soft. There is no tenderness.  Musculoskeletal: He exhibits edema (trace bilat LE edema).  Lymphadenopathy:    He has no cervical adenopathy.  Neurological: He  is alert and oriented to person, place, and time.  Skin: Skin is warm and dry.  Psychiatric: He has a normal mood and affect.    ASSESSMENT & PLAN:    Chronic combined systolic and diastolic CHF (congestive heart failure) (HCC) EF 30-35 by cardiac MRI in May 2019.  NYHA 2.  Volume appears stable on exam today.  Cardiac catheterization did not demonstrate any lesions amenable to PCI.  He has previously been on ARB therapy.  Beta-blocker therapy was started the time of his cardiac catheterization.  He has not lately clear on all of his medications but believes that he is still taking carvedilol and irbesartan.  We discussed the importance of further medication adjustments and titration as relates to congestive heart failure.  -Continue to irbesartan (he will check his medications when he gets home today)  -Increase carvedilol to 6.25 mg twice daily  -Consider adding spironolactone at follow-up  -Consider changing ARB to [facility]  -Arrange follow-up echocardiogram once he is on maximal medical therapy  -If EF <35, refer to EP for consideration of ICD  -Obtain BMET, Mg2+ today (PVCs on ECG)  Coronary artery disease involving native coronary artery of native heart without angina pectoris History of remote myocardial infarction treated with TPA and stenting of the RCA in 1999 and angioplasty of the PDA in 2000.  Cardiac catheterization May 2019 demonstrated chronically occluded LAD and a chronically occluded RCA.  He has distal 80% stenosis in the LCx and moderate nonobstructive disease elsewhere.  He has collaterals to the distal LAD and distal RCA from the LCx system.  There were no targets amenable to PCI and medical therapy continued.  He denies anginal symptoms.  Continue beta-blocker aspirin, ezetimibe.  Essential hypertension  Blood pressure well controlled here today.  The patient does note higher blood pressures at home.  I have encouraged him to bring his blood pressure cuff to his next  appointment so that it can be checked.  As noted above, we discussed the importance of medication titration for management of his cardiomyopathy and congestive heart failure.  Carvedilol will be increased today as outlined above.  Hyperlipidemia, unspecified hyperlipidemia type He is intolerant to statins.  Continue ezetimibe.   Dispo:  Return in about 3 weeks (around 01/19/2018) for Close Follow Up w/ Dr. Angelena Form or Richardson Dopp, PA-C.   Medication Adjustments/Labs and Tests Ordered: Current medicines are reviewed at length with the patient today.  Concerns regarding medicines are outlined above.  Tests Ordered: Orders Placed This Encounter  Procedures  . Basic Metabolic Panel (BMET)  . Magnesium  . EKG 12-Lead   Medication Changes: Meds ordered this encounter  Medications  . carvedilol (COREG) 6.25 MG tablet    Sig: Take 1 tablet (6.25 mg total) by mouth 2 (two) times daily.    Dispense:  180 tablet    Refill:  3    DOSE INCREASE  . irbesartan (AVAPRO) 150 MG tablet    Sig: Take 1 tablet (150 mg total) by mouth daily.    Dispense:  90 tablet    Refill:  3    Signed, Richardson Dopp, PA-C  12/29/2017 4:46 PM    [facility] [address], [place], [place]  [address] Phone: [phone]; Fax: [phone]

## 2017-12-29 NOTE — Patient Instructions (Addendum)
Medication Instructions:  1. INCREASE COREG TO 6.25 MG TWICE DAILY; NEW RX HAS BEEN SENT IN FOR THE NEW DOSE  2. IRBESARTAN 150 MG DAILY HAS BEEN ADDED BACK ON TO YOUR MEDICATION LIST  Labwork: TODAY BMET, MAGNESIUM LEVEL  Testing/Procedures: NONE ORDERED TODAY  Follow-Up: DR. Angelena Form ON 01/20/18 @ 9 AM   Any Other Special Instructions Will Be Listed Below (If Applicable).     If you need a refill on your cardiac medications before your next appointment, please call your pharmacy.

## 2017-12-30 LAB — BASIC METABOLIC PANEL
BUN / CREAT RATIO: 15 (ref 10–24)
BUN: 13 mg/dL (ref 8–27)
CHLORIDE: 97 mmol/L (ref 96–106)
CO2: 25 mmol/L (ref 20–29)
Calcium: 9.3 mg/dL (ref 8.6–10.2)
Creatinine, Ser: 0.87 mg/dL (ref 0.76–1.27)
GFR, EST AFRICAN AMERICAN: 103 mL/min/{1.73_m2} (ref >59)
GFR, EST NON AFRICAN AMERICAN: 89 mL/min/{1.73_m2} (ref >59)
Glucose: 136 mg/dL — ABNORMAL HIGH (ref 65–99)
POTASSIUM: 4.1 mmol/L (ref 3.5–5.2)
Sodium: 141 mmol/L (ref 134–144)

## 2017-12-30 LAB — MAGNESIUM: Magnesium: 2 mg/dL (ref 1.6–2.3)

## 2017-12-31 ENCOUNTER — Telehealth: Payer: Self-pay | Admitting: *Deleted

## 2017-12-31 NOTE — Telephone Encounter (Signed)
-----   Message from Liliane Shi, [place] sent at 12/30/2017  5:52 PM EDT ----- The following abnormalities are noted:  Glucose elevated. Renal function, potassium, magnesium are normal.  All other values are normal, stable or within acceptable limits. Medication changes / Follow up labs / Other changes or recommendations:    - Continue current medications and follow up as planned.  Richardson Dopp, PA-C 12/30/2017 5:52 PM

## 2017-12-31 NOTE — Telephone Encounter (Signed)
Left message to go over lab results.  

## 2018-01-01 DIAGNOSIS — M4722 Other spondylosis with radiculopathy, cervical region: Secondary | ICD-10-CM | POA: Diagnosis not present

## 2018-01-01 DIAGNOSIS — I10 Essential (primary) hypertension: Secondary | ICD-10-CM | POA: Diagnosis not present

## 2018-01-01 DIAGNOSIS — M5416 Radiculopathy, lumbar region: Secondary | ICD-10-CM | POA: Diagnosis not present

## 2018-01-01 NOTE — Telephone Encounter (Signed)
DPR ok to lmom. Left message lab work ok. Glucose elevated. Keep appt with Dr. Angelena Form 01/20/18. Continue on current Tx plan. If any questions please call [phone], otherwise have a great weekend. I will forward to PCP Dr. Sinda Du.

## 2018-01-01 NOTE — Telephone Encounter (Signed)
-----   Message from Liliane Shi, [place] sent at 12/30/2017  5:52 PM EDT ----- The following abnormalities are noted:  Glucose elevated. Renal function, potassium, magnesium are normal.  All other values are normal, stable or within acceptable limits. Medication changes / Follow up labs / Other changes or recommendations:    - Continue current medications and follow up as planned.  Richardson Dopp, PA-C 12/30/2017 5:52 PM

## 2018-01-19 NOTE — Progress Notes (Signed)
Chief Complaint  Patient presents with  . Follow-up    CAD     History of Present Illness: 67 yo male with history of CAD, cardiomyopathy, PAD, HTN, OSA who is here today for cardiac follow up. His coronary history includes MI in 1999 treated with tPA and stent RCA and then balloon angioplasty of the PDA and second marginal in March of 2000. Admitted to [facility] December 2012 to VVS service with an ischemic right leg. He had undergone prior aortic Zenith aneurysm stent graft repair in 2007 and in 2012 was found to have occluded right limb of stent graft as well as thrombosed popliteal aneurysm right leg. He underwent femoral-femoral bypass grafting and a femoral to below knee popliteal artery bypass graft with Gore-Tex. Readmitted March 2013 and underwent ligation of left popliteal aneurysm with left above-knee to below-knee popliteal bypass with reversed greater saphenous vein by Dr. Oneida Alar. I saw him 02/13/12 and he had c/o chest pain and dyspnea. Lexiscan stress myoview on 02/18/12 showed a moderate sized scar of moderate severity involving the mid-inferior, basal inferior, mid-inferolateral and basal inferolateral segments. There was minimal reversibility. LVEF=38%. Cardiac cath 03/11/12 with moderate stenosis mid LAD and mid Circumflex with severe stenosis small OM branch (too small for PCI) and totally occluded mid RCA. We continued medical management. Carotid artery dopplers November 2013 with mild bilateral disease. He was seen in our office 04/07/14 for surgical clearance before gastric sleeve surgery. Stress myoview 04/13/14 was a high risk study with no clear ischemia, reduced LVEF. Echo 04/25/14 with LVEF=45-50%.   He saw Richardson Dopp, PA-C on 08/14/17 and had no complaints. 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 Echo 08/17/17 with LVEF=30-35%, trivial AI, trivial MR. Cardiac MRI on 08/26/17 with moderately dilated left ventricle with inferolateral and inferior akinesis in the  distribution of the occluded RCA. There was suggestion of viable tissue in the inferior wall. Also mid anterolateral hypokinesis. The LVEF is estimated to be 30-35%. I saw him back in follow up and he c/o more dyspnea. Cardiac cath may 2019 with moderate mid LAD stenosis, occluded distal LAD, non-obstructive disease in the Circumflex and chronic occlusion of the RCA. Medical therapy was recommended. He has been on Coreg and Avapro since.   He is here today for follow up. The patient denies any chest pain, dyspnea, palpitations, lower extremity edema, orthopnea, PND, dizziness, near syncope or syncope. Most days he feels well. He has some fatigue but not every day.   Primary Care Physician:  Sinda Du, MD  Past Medical History:  Diagnosis Date  . AAA (abdominal aortic aneurysm) ([place]) 2007   stent graft repair 12/07  . Arteriosclerotic cardiovascular disease (ASCVD)    a. MI in 1999;  b.  PTCA of PDA and 2nd marginal in 3/00;  c. 02/2012 Cath: LM nl, LAD 10m, LCX 65m, OM small, 70, RCA 99p/121m-->Med Rx;  d. 03/2014 MV: EF 32%, inflat infarct w/ mild peri-infarct isch towards apex (similar to 2013 MV); d. 03/2014 Echo: EF 45-50%, Gr 1 DD, mildly dil LA.  Marland Kitchen CAD (coronary artery disease)   . Cerebrovascular disease   . CHF (congestive heart failure) ([place])   . Cholelithiasis 11/28/2010  . Cough, persistent   . Diabetes mellitus without complication ([place])   . Diverticulitis 2006  . DVT (deep venous thrombosis) ([place])   . Elevated PSA 06/2011   a. s/p TURP 01/2014.  Marland Kitchen Gastroesophageal reflux disease    Hiatal hernia; previously treated with PPI  .  Hypertension    Mild; controlled with a single agent    . Laryngeal carcinoma ([place]) 2010   resection and radiation therapy 2010-11  . Myocardial infarction ([place])   . Obesity    BMI 47; lap band surgery 2010  . 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 Sleep apnea    BiPAP mask utilized        2 yrs sleep study    dr Redmond Pulling  .  Tobacco abuse, in remission    40 pack years; discontinued in 2000    Past Surgical History:  Procedure Laterality Date  . ANTERIOR LAT LUMBAR FUSION N/A 02/22/2016   Procedure: LUMBAR TWO-LUMBAR FIVE ANTERIOR LATERAL LUMBAR INTERBODY FUSION WITH PERCUTANEOUS PEDICLE SCREWS;  Surgeon: Kevan Ny Ditty, MD;  Location: [facility];  Service: Neurosurgery;  Laterality: N/A;  Left side approach  . CARDIAC CATHETERIZATION  2000   PCI-stent  . CATARACT EXTRACTION    . COLONOSCOPY  10/2007  . COLONOSCOPY WITH ESOPHAGOGASTRODUODENOSCOPY (EGD)  2009   RMR: He had distal esophageal erosions, patulous GE junction, erosive reflux esophagitis, hiatal hernia, lipoma on the angularis not manipulated, left-sided diverticula.  . CYSTOSCOPY  02/22/2016   Procedure: CYSTOSCOPY FLEXIBLE WITH URETHER DILATION  WITH CATHETER PLACEMENT;  Surgeon: Alexis Frock, MD;  Location: [facility];  Service: Urology;;  . EYE SURGERY    . FEMORAL-FEMORAL BYPASS GRAFT  04/18/2011   Procedure: BYPASS GRAFT FEMORAL-FEMORAL ARTERY;  Surgeon: Theotis Burrow, MD;  Location: [facility];  Service: Vascular;  Laterality: Bilateral;  Left to right femoral to femoral bypass   . FEMORAL-POPLITEAL BYPASS GRAFT  04/18/2011   Procedure: BYPASS GRAFT FEMORAL-POPLITEAL ARTERY;  Surgeon: Theotis Burrow, MD;  Location: [facility];  Service: Vascular;  Laterality: Right;  Right femoral popliteal bypass with propaten gortex graft  . gastic sleeve  07/2015   gastic band removed and have sleeve  . LAPAROSCOPIC GASTRIC BANDING  2010  . LARYNX SURGERY  2010   Resection of carcinoma  . LEFT HEART CATH AND CORONARY ANGIOGRAPHY N/A 09/02/2017   Procedure: LEFT HEART CATH AND CORONARY ANGIOGRAPHY;  Surgeon: Burnell Blanks, MD;  Location: [facility];  Service: Cardiovascular;  Laterality: N/A;  . LEFT HEART CATHETERIZATION WITH CORONARY ANGIOGRAM N/A 03/10/2012   Procedure: LEFT HEART CATHETERIZATION WITH CORONARY ANGIOGRAM;  Surgeon: Burnell Blanks, MD;  Location: [facility] CATH LAB;  Service: Cardiovascular;  Laterality: N/A;  . LUMBAR PERCUTANEOUS PEDICLE SCREW 3 LEVEL N/A 02/22/2016   Procedure: LUMBAR PERCUTANEOUS PEDICLE SCREW LUMBAR TWO-LUMBAR FIVE;  Surgeon: Kevan Ny Ditty, MD;  Location: [facility];  Service: Neurosurgery;  Laterality: N/A;  . OPEN ANTERIOR SHOULDER RECONSTRUCTION  2011   Left  . PERIPHERAL ARTERIAL STENT GRAFT  2007   for abdominal aortic aneurysm  . PR VEIN BYPASS GRAFT,AORTO-FEM-POP  07-23-11   Left AK to BK popliteal BPG using rev. saphenous vein    Current Outpatient Medications  Medication Sig Dispense Refill  . acetaminophen (TYLENOL) 500 MG tablet Take 500 mg by mouth every 6 (six) hours as needed for mild pain.    Marland Kitchen aspirin EC 81 MG tablet Take 1 tablet (81 mg total) by mouth every other day.    . carvedilol (COREG) 6.25 MG tablet Take 1 tablet (6.25 mg total) by mouth 2 (two) times daily. 180 tablet 3  . Cholecalciferol (VITAMIN D) 2000 units CAPS Take 2,000 Units by mouth daily.     Marland Kitchen ezetimibe (ZETIA) 10 MG tablet  Take 10 mg by mouth daily.    Marland Kitchen gabapentin (NEURONTIN) 400 MG capsule Take 400 mg by mouth at bedtime.     . hydrALAZINE (APRESOLINE) 50 MG tablet Take 50 mg by mouth See admin instructions. Patient only takes when he has elevated blood pressure readings.    . irbesartan (AVAPRO) 150 MG tablet Take 1 tablet (150 mg total) by mouth daily. 90 tablet 3  . metFORMIN (GLUCOPHAGE) 500 MG tablet Take 500 mg by mouth daily as needed (High Blood sugar above 140).     . Multiple Vitamin (MULTIVITAMIN WITH MINERALS) TABS tablet Take 1 tablet by mouth daily.    . nitroGLYCERIN (NITROSTAT) 0.4 MG SL tablet Place 0.4 mg under the tongue every 5 (five) minutes as needed for chest pain.    . pantoprazole (PROTONIX) 40 MG tablet Take 40 mg by mouth every other day.    . potassium chloride SA (KLOR-CON M20) 20 MEQ tablet Take 20 mEq by mouth every other day.    . torsemide (DEMADEX) 20 MG tablet Take 20  mg by mouth every other day.     . Vitamin D, Ergocalciferol, (DRISDOL) 50000 units CAPS capsule TAKE ONE CAPSULE BY MOUTH ONCE A WEEK ON TUESDAYS     No current facility-administered medications for this visit.     Allergies  Allergen Reactions  . Finasteride Other (See Comments)    Unable to void Unable to void Unable to void  . Other Rash    Beta-blockers Beta-blockers Beta-blockers  . Statins Hives and Rash  . Aspirin Other (See Comments)    Cannot take daily, just once in a while Cannot take daily, just once in a while Other reaction(s): GI Upset (intolerance) Cannot take daily, just once in a while Daily dose upsets stomach    Social History   Socioeconomic History  . Marital status: Married    Spouse name: Not on file  . Number of children: 3  . Years of education: Not on file  . Highest education level: Not on file  Occupational History  . Occupation: Retired, part Doctor, general practice  Social Needs  . Financial resource strain: Not on file  . Food insecurity:    Worry: Not on file    Inability: Not on file  . Transportation needs:    Medical: Not on file    Non-medical: Not on file  Tobacco Use  . Smoking status: Former Smoker    Packs/day: 2.00    Years: 30.00    Pack years: 60.00    Types: Cigarettes    Last attempt to quit: 11/26/1997    Years since quitting: 20.1  . Smokeless tobacco: Never Used  . Tobacco comment: 40 pack years; quit 2000  Substance and Sexual Activity  . Alcohol use: No    Alcohol/week: 0.0 standard drinks  . Drug use: No  . Sexual activity: Yes  Lifestyle  . Physical activity:    Days per week: Not on file    Minutes per session: Not on file  . Stress: Not on file  Relationships  . Social connections:    Talks on phone: Not on file    Gets together: Not on file    Attends religious service: Not on file    Active member of club or organization: Not on file    Attends meetings of clubs or organizations: Not on file     Relationship status: Not on file  . Intimate partner violence:    Fear of  current or ex partner: Not on file    Emotionally abused: Not on file    Physically abused: Not on file    Forced sexual activity: Not on file  Other Topics Concern  . Not on file  Social History Narrative   Married, past employed, does not get regular exercise.     Family History  Problem Relation Age of Onset  . Heart attack Father        deceased  . Heart failure Father   . Hyperlipidemia Father   . Hypertension Father   . Heart disease Father   . Lung cancer Mother        deceased   . Hypertension Mother   . Hyperlipidemia Mother   . Heart failure Mother   . Diabetes Mother   . Cancer Sister        vaginal  . Diabetes Sister   . Colon cancer Neg Hx     Review of Systems:  As stated in the HPI and otherwise negative.   BP 136/80   Pulse 85   Ht 5\' 11"  (1.803 m)   Wt (!) 302 lb 1.9 oz (137 kg)   SpO2 95%   BMI 42.14 kg/m   Physical Examination:  General: Well developed, well nourished, NAD  HEENT: OP clear, mucus membranes moist  SKIN: warm, dry. No rashes. Neuro: No focal deficits  Musculoskeletal: Muscle strength 5/5 all ext  Psychiatric: Mood and affect normal  Neck: No JVD, no carotid bruits, no thyromegaly, no lymphadenopathy.  Lungs:Clear bilaterally, no wheezes, rhonci, crackles Cardiovascular: Regular rate and rhythm. No murmurs, gallops or rubs. Abdomen:Soft. Bowel sounds present. Non-tender.  Extremities: No lower extremity edema. Pulses are 2 + in the bilateral DP/PT.  Cardiac MRI 08/26/17: Moderately dilated left ventricle with normal wall thickness. Inferolateral and inferior akinesis. Mid anterolateral hypokinesis. Unfortunately, short axis stack to quantify EF was not done (I am not sure why). Visually, EF is 30-35%. Normal right ventricular size and systolic function. Moderate left atrial enlargement, normal right atrium. Trileaflet aortic valve with mild  regurgitation, no stenosis. No significant mitral regurgitation.  Delayed enhancement imaging showed < 50% wall thickness subendocardial late gadolinium enhancement (LGE) in the inferior and inferolateral walls.  IMPRESSION: 1. Moderately dilated LV with wall motion abnormalities as noted above. In absence of a short axis stack for EF calculation, I estimated EF to be 30-35%.  2.  Normal RV size and systolic function.  3. CAD-pattern LGE in the inferior and inferolateral walls. < 50% wall thickness subendocardial LGE suggests viability.  Echo 08/17/17:  - Left ventricle: The cavity size was moderately dilated. There was   moderate concentric hypertrophy. Systolic function was moderately   to severely reduced. The estimated ejection fraction was in the   range of 30% to 35%. Global hypokinesis and inferior akinesis.   Doppler parameters are consistent with pseudonormal left   ventricular relaxation (grade 2 diastolic dysfunction). The E/e&'   ratio is >20, suggesting markedly elevated LV filling pressure. - Aortic valve: Sclerosis without stenosis. There was trivial   regurgitation. - Aorta: Ascending aortic diameter: 39 mm (S). - Ascending aorta: The ascending aorta was mildly dilated. - Mitral valve: Mildly thickened leaflets . There was trivial   regurgitation. - Left atrium: Severely dilated. - Right ventricle: The cavity size was mildly dilated. - Right atrium: The atrium was mildly dilated. - Inferior vena cava: The vessel was normal in size. The   respirophasic diameter changes were in the  normal range (>= 50%),   consistent with normal central venous pressure.  Impressions:  - Compared to a prior study in 2015, the LVEF is lower at 30-35%   with global hypokinesis and inferior akinesis, grade 2 DD and   high LV filling pressure.  EKG:  EKG is not  ordered today. The ekg ordered today demonstrates   Recent Labs: 08/28/2017: Hemoglobin 15.9; Platelets  165 11/17/2017: ALT 24 12/29/2017: BUN 13; Creatinine, Ser 0.87; Magnesium 2.0; Potassium 4.1; Sodium 141   Lipid Panel Followed in primary care   Wt Readings from Last 3 Encounters:  01/20/18 (!) 302 lb 1.9 oz (137 kg)  12/29/17 298 lb 12.8 oz (135.5 kg)  09/02/17 280 lb (127 kg)     Other studies Reviewed: Additional studies/ records that were reviewed today include: . Review of the above records demonstrates:    Assessment and Plan:   1. CAD without angina: Cardiac cath May 2019 with moderate mid LAD stenosis, chronic occlusion of the distal LAD, chronic occlusion of the RCA and mild disease in the Circumflex artery. No lesions optimal for PCI. He has had no chest pain. Will continue medical management of CAD with beta blocker and ARB. Continue ASA. He does not tolerate statins. Continue Zetia.   2. Carotid artery disease: Mild bilateral disease by carotid dopplers November 2013.  He is followed in VVS but I do not see any recent carotid dopplers. Will arrange carotid artery dopplers.   3. PAD: Followed in VVS. No leg pain with normal activities.   4. Ischemic Cardiomyopathy/Chronic diastolic/systolic CHF: His LV systolic function has varied between 35 and 45% over the last few years. Most recent assessment of LV systolic function April 2376 with cardiac MRI showing LVEF=30-35%. No good options for revascularization on cath May 2019. He is  NYHA class 2. Will continue Coreg and Avapro. Will add aldactone 12. 5 mg daily. Repeat BMET in one week. Will repeat echo in 3 months to look at his LVEF after being on optimal medical therapy. If LVEF still below 35%, will need referral to EP to discuss ICD. 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   5. Hyperlipidemia: He is intolerant to statins. LDL near goal of 70. Will continue Zetia. Could consider Repatha or Praluent in the future if LDL is uncontrolled. I discussed this today.  He would be willing to try if needed.   6. HTN:  BP is controlled. No changes.   Current medicines are reviewed at length with the patient today.  The patient does not have concerns regarding medicines.  The following changes have been made:  no change  Labs/ tests ordered today include:   No orders of the defined types were placed in this encounter.   Disposition:   FU with me in 4 months.   Signed, Lauree Chandler, MD 01/20/2018 9:12 AM    [facility] [place], [place], [place]  [address] Phone: [phone]; Fax: [phone]

## 2018-01-20 ENCOUNTER — Ambulatory Visit (INDEPENDENT_AMBULATORY_CARE_PROVIDER_SITE_OTHER): Payer: Medicare Other | Admitting: Cardiovascular Disease

## 2018-01-20 ENCOUNTER — Encounter: Payer: Self-pay | Admitting: Cardiovascular Disease

## 2018-01-20 VITALS — BP 136/80 | HR 85 | Ht 71.0 in | Wt 302.1 lb

## 2018-01-20 DIAGNOSIS — I2510 Atherosclerotic heart disease of native coronary artery without angina pectoris: Secondary | ICD-10-CM | POA: Diagnosis not present

## 2018-01-20 DIAGNOSIS — I6523 Occlusion and stenosis of bilateral carotid arteries: Secondary | ICD-10-CM

## 2018-01-20 DIAGNOSIS — I5042 Chronic combined systolic (congestive) and diastolic (congestive) heart failure: Secondary | ICD-10-CM

## 2018-01-20 DIAGNOSIS — I10 Essential (primary) hypertension: Secondary | ICD-10-CM | POA: Diagnosis not present

## 2018-01-20 DIAGNOSIS — I255 Ischemic cardiomyopathy: Secondary | ICD-10-CM

## 2018-01-20 DIAGNOSIS — E785 Hyperlipidemia, unspecified: Secondary | ICD-10-CM

## 2018-01-20 MED ORDER — SPIRONOLACTONE 25 MG PO TABS: 12.5000 mg | ORAL_TABLET | Freq: Every day | ORAL | 6 refills | Status: DC

## 2018-01-20 NOTE — Patient Instructions (Signed)
Medication Instructions:  Your physician has recommended you make the following change in your medication:  Start Aldactone 12.5 mg by mouth daily.   Labwork: Your physician recommends that you return for lab work in: 7-10 days--BMET   Testing/Procedures: Your physician has requested that you have a carotid duplex. This test is an ultrasound of the carotid arteries in your neck. It looks at blood flow through these arteries that supply the brain with blood. Allow one hour for this exam. There are no restrictions or special instructions.  Your physician has requested that you have an echocardiogram. Echocardiography is a painless test that uses sound waves to create images of your heart. It provides your doctor with information about the size and shape of your heart and how well your heart's chambers and valves are working. This procedure takes approximately one hour. There are no restrictions for this procedure. To be done in 3 months.     Follow-Up: Your physician recommends that you schedule a follow-up appointment in: 4 months. Please call our office next week to schedule this appointment    Any Other Special Instructions Will Be Listed Below (If Applicable).     If you need a refill on your cardiac medications before your next appointment, please call your pharmacy.

## 2018-01-29 ENCOUNTER — Ambulatory Visit (HOSPITAL_COMMUNITY)
Admission: RE | Admit: 2018-01-29 | Discharge: 2018-01-29 | Disposition: A | Discharge status: Home / Self Care | Payer: Medicare Other | Source: Ambulatory Visit | Attending: Cardiology | Admitting: Cardiology

## 2018-01-29 DIAGNOSIS — I6523 Occlusion and stenosis of bilateral carotid arteries: Secondary | ICD-10-CM | POA: Diagnosis not present

## 2018-02-01 ENCOUNTER — Telehealth: Payer: Self-pay | Admitting: *Deleted

## 2018-02-01 DIAGNOSIS — I6523 Occlusion and stenosis of bilateral carotid arteries: Secondary | ICD-10-CM

## 2018-02-01 NOTE — Telephone Encounter (Signed)
-----   Message from Burnell Blanks, MD sent at 01/29/2018  6:04 PM EDT ----- Mild bilateral carotid artery disease. Repeat in 2 years. cdm

## 2018-02-01 NOTE — Addendum Note (Signed)
Addended by: Thompson Grayer on: 02/01/2018 05:12 PM   Modules accepted: Orders

## 2018-02-01 NOTE — Telephone Encounter (Signed)
Pt was to have BMET at time of carotid doppler. Results not in Epic. Will need to have BMET if it has not been done. I spoke with pt's wife and reviewed carotid doppler results with her. Pt did not have blood work done because he did not start aldactone until yesterday.  He will come in on 02/08/18 for BMET

## 2018-02-08 ENCOUNTER — Telehealth: Payer: Self-pay | Admitting: Cardiovascular Disease

## 2018-02-08 ENCOUNTER — Other Ambulatory Visit: Payer: Medicare Other

## 2018-02-08 NOTE — Telephone Encounter (Signed)
New Message   Pt c/o medication issue:  1. Name of Medication: spironolactone (ALDACTONE) 25 MG tablet  2. How are you currently taking this medication (dosage and times per day)? Take 0.5 tablets (12.5 mg total) by mouth daily  3. Are you having a reaction (difficulty breathing--STAT)? yes  4. What is your medication issue? Pt's wife is calling, states that the pt is having pain in hands and feet and also some itching. Please call

## 2018-02-08 NOTE — Telephone Encounter (Signed)
Pt's wife notified.  I asked her to have pt follow up with primary care if symptoms continue after stopping aldactone.

## 2018-02-08 NOTE — Telephone Encounter (Signed)
I would stop the aldactone since his symptoms started shortly after this was started. Gerald Stabs

## 2018-02-08 NOTE — Telephone Encounter (Signed)
I reviewed with Tana Coast, PharmD and these are not common  reactions but due to time frame it could be related to aldactone. I spoke with pt. He started aldactone on 10/7. Three days later he began having pain in feet/hands and itching.  Itching is all over. No rash. Pain is in entire foot and hand. States he can hardly stand on feet today due to pain. Pain is in both hands and both feet.  No swelling. He has neuropathy but this is usually controlled.  Took aldactone earlier today and symptoms worsened about half hour after taking.   No other symptoms.  No other new medicines or changes.

## 2018-02-11 DIAGNOSIS — I255 Ischemic cardiomyopathy: Secondary | ICD-10-CM | POA: Diagnosis not present

## 2018-02-11 DIAGNOSIS — E1151 Type 2 diabetes mellitus with diabetic peripheral angiopathy without gangrene: Secondary | ICD-10-CM | POA: Diagnosis not present

## 2018-02-11 DIAGNOSIS — E119 Type 2 diabetes mellitus without complications: Secondary | ICD-10-CM | POA: Diagnosis not present

## 2018-02-11 DIAGNOSIS — E559 Vitamin D deficiency, unspecified: Secondary | ICD-10-CM | POA: Diagnosis not present

## 2018-02-11 DIAGNOSIS — E785 Hyperlipidemia, unspecified: Secondary | ICD-10-CM | POA: Diagnosis not present

## 2018-02-11 DIAGNOSIS — I10 Essential (primary) hypertension: Secondary | ICD-10-CM | POA: Diagnosis not present

## 2018-03-02 DIAGNOSIS — R109 Unspecified abdominal pain: Secondary | ICD-10-CM | POA: Diagnosis not present

## 2018-03-02 DIAGNOSIS — R935 Abnormal findings on diagnostic imaging of other abdominal regions, including retroperitoneum: Secondary | ICD-10-CM | POA: Diagnosis not present

## 2018-03-02 DIAGNOSIS — K8020 Calculus of gallbladder without cholecystitis without obstruction: Secondary | ICD-10-CM | POA: Diagnosis not present

## 2018-03-02 DIAGNOSIS — N281 Cyst of kidney, acquired: Secondary | ICD-10-CM | POA: Diagnosis not present

## 2018-03-19 ENCOUNTER — Telehealth: Payer: Self-pay | Admitting: Cardiovascular Disease

## 2018-03-19 MED ORDER — HYDRALAZINE HCL 50 MG PO TABS: 50.0000 mg | ORAL_TABLET | ORAL | 6 refills | Status: DC

## 2018-03-19 NOTE — Telephone Encounter (Signed)
New message:      Pt c/o BP issue: STAT if pt c/o blurred vision, one-sided weakness or slurred speech  1. What are your last 5 BP readings? 184/101  2. Are you having any other symptoms (ex. Dizziness, headache, blurred vision, passed out)? Dizziness,headache,blurred vision  3. What is your BP issue? Pt's BP is high.

## 2018-03-19 NOTE — Telephone Encounter (Signed)
Thanks!    Chris.

## 2018-03-19 NOTE — Telephone Encounter (Signed)
Pt wife called to report that the pts Bp has been running high since last night.. 186/88 on 03/18/18 and he had a headache so he took an extra Irbesartan because he is out of his PRN Hydralazine... This morning his BP was 184/101 prior to taking his Irbesartan and he had a headache but no dizziness, chest pain, and sob... Pt took his meds and I had him recheck his Bp and it is now 168/88 and he is feeling better. I advised them that I will send in the Hydralazine and he should go ahead and take it this afternoon and to continue to monitor his BP and let us know if he has any further problems.. Pt verbalized understanding and agrees.

## 2018-03-22 DIAGNOSIS — E785 Hyperlipidemia, unspecified: Secondary | ICD-10-CM | POA: Diagnosis not present

## 2018-03-22 DIAGNOSIS — E119 Type 2 diabetes mellitus without complications: Secondary | ICD-10-CM | POA: Diagnosis not present

## 2018-03-22 DIAGNOSIS — I2510 Atherosclerotic heart disease of native coronary artery without angina pectoris: Secondary | ICD-10-CM | POA: Diagnosis not present

## 2018-03-22 DIAGNOSIS — I10 Essential (primary) hypertension: Secondary | ICD-10-CM | POA: Diagnosis not present

## 2018-04-07 DIAGNOSIS — E1151 Type 2 diabetes mellitus with diabetic peripheral angiopathy without gangrene: Secondary | ICD-10-CM | POA: Diagnosis not present

## 2018-04-07 DIAGNOSIS — I10 Essential (primary) hypertension: Secondary | ICD-10-CM | POA: Diagnosis not present

## 2018-04-12 ENCOUNTER — Other Ambulatory Visit (HOSPITAL_COMMUNITY): Payer: Medicare Other

## 2018-04-20 ENCOUNTER — Other Ambulatory Visit: Payer: Self-pay

## 2018-04-20 ENCOUNTER — Ambulatory Visit (HOSPITAL_COMMUNITY): Payer: Medicare Other | Attending: Cardiovascular Disease

## 2018-04-20 ENCOUNTER — Encounter (INDEPENDENT_AMBULATORY_CARE_PROVIDER_SITE_OTHER): Payer: Self-pay

## 2018-04-20 DIAGNOSIS — I2510 Atherosclerotic heart disease of native coronary artery without angina pectoris: Secondary | ICD-10-CM

## 2018-04-20 DIAGNOSIS — I255 Ischemic cardiomyopathy: Secondary | ICD-10-CM

## 2018-04-20 MED ORDER — PERFLUTREN LIPID MICROSPHERE
1.0000 mL | INTRAVENOUS | Status: AC | PRN
  Administered 2018-04-20: 3 mL via INTRAVENOUS

## 2018-04-22 ENCOUNTER — Telehealth: Payer: Self-pay | Admitting: *Deleted

## 2018-04-22 NOTE — Telephone Encounter (Signed)
Called pt re: echo results, left a message for pt to cal back.

## 2018-04-22 NOTE — Telephone Encounter (Signed)
-----   Message from Burnell Blanks, MD sent at 04/22/2018  8:42 AM EST ----- His heart muscle has gotten stronger since April 2019. LvEF now 40-45%. This is good news. Can we let him know? Thanks, chris

## 2018-04-23 ENCOUNTER — Telehealth: Payer: Self-pay

## 2018-04-23 NOTE — Telephone Encounter (Signed)
Notes recorded by Frederik Schmidt, RN on 04/23/2018 at 9:41 AM EST I tried to reach patient, but on cell number his wife answers and mailbox full. The home phone number does not work. 12/27 ------

## 2018-04-23 NOTE — Telephone Encounter (Signed)
-----   Message from Burnell Blanks, MD sent at 04/22/2018  8:42 AM EST ----- His heart muscle has gotten stronger since April 2019. LvEF now 40-45%. This is good news. Can we let him know? Thanks, chris

## 2018-04-26 NOTE — Telephone Encounter (Signed)
Pt came into office to get results.  I reviewed echo results with him and gave him copy of report

## 2018-05-13 ENCOUNTER — Other Ambulatory Visit: Payer: Self-pay | Admitting: Cardiovascular Disease

## 2018-05-26 NOTE — Progress Notes (Signed)
Chief Complaint  Patient presents with  . Follow-up    CAD     History of Present Illness: 68 yo male with history of CAD, cardiomyopathy, PAD, HTN, obesity and sleep apnea who is here today for cardiac follow up. His coronary history includes MI in 1999 treated with tPA and stent RCA and then balloon angioplasty of the PDA and second marginal in March of 2000. Admitted to [facility] December 2012 to VVS service with an ischemic right leg. He had undergone prior aortic Zenith aneurysm stent graft repair in 2007 and in 2012 was found to have occluded right limb of stent graft as well as thrombosed popliteal aneurysm right leg. He underwent femoral-femoral bypass grafting and a femoral to below knee popliteal artery bypass graft with Gore-Tex. Readmitted March 2013 and underwent ligation of left popliteal aneurysm with left above-knee to below-knee popliteal bypass with reversed greater saphenous vein by Dr. Oneida Alar. I saw him 02/13/12 and he had c/o chest pain and dyspnea. Lexiscan stress myoview on 02/18/12 showed a moderate sized scar of moderate severity involving the mid-inferior, basal inferior, mid-inferolateral and basal inferolateral segments. There was minimal reversibility. LVEF=38%. Cardiac cath 03/11/12 with moderate stenosis mid LAD and mid Circumflex with severe stenosis small OM branch (too small for PCI) and totally occluded mid RCA. We continued medical management. Carotid artery dopplers November 2013 with mild bilateral disease. He was seen in our office 04/07/14 for surgical clearance before gastric sleeve surgery. Stress myoview 04/13/14 was a high risk study with no clear ischemia, reduced LVEF. Echo 04/25/14 with LVEF=45-50%. He saw Richardson Dopp, PA-C on 08/14/17 and had no complaints. 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 Echo 08/17/17 with LVEF=30-35%, trivial AI, trivial MR. Cardiac MRI on 08/26/17 with moderately dilated left ventricle with inferolateral and inferior akinesis  in the distribution of the occluded RCA. There was suggestion of viable tissue in the inferior wall. Also mid anterolateral hypokinesis. The LVEF is estimated to be 30-35%. I saw him back in follow up and he c/o more dyspnea. Cardiac cath May 2019 with moderate mid LAD stenosis, occluded distal LAD, non-obstructive disease in the Circumflex and chronic occlusion of the RCA. Medical therapy was recommended. Carotid artery doppler October 2019 with mild bilateral carotid artery disease. Repeat echo December 2019 with LVEF=40-45%.   He is here today for follow up. The patient denies any chest pain, dyspnea, palpitations, lower extremity edema, orthopnea, PND, dizziness, near syncope or syncope. He feels great. Takes torsemide every other day, sometimes daily. BP has been 381-829 systolic at home.   Primary Care Physician:  Sinda Du, MD  Past Medical History:  Diagnosis Date  . AAA (abdominal aortic aneurysm) ([place]) 2007   stent graft repair 12/07  . Arteriosclerotic cardiovascular disease (ASCVD)    a. MI in 1999;  b.  PTCA of PDA and 2nd marginal in 3/00;  c. 02/2012 Cath: LM nl, LAD 52m, LCX 30m, OM small, 70, RCA 99p/164m-->Med Rx;  d. 03/2014 MV: EF 32%, inflat infarct w/ mild peri-infarct isch towards apex (similar to 2013 MV); d. 03/2014 Echo: EF 45-50%, Gr 1 DD, mildly dil LA.  Marland Kitchen CAD (coronary artery disease)   . Cerebrovascular disease   . CHF (congestive heart failure) ([place])   . Cholelithiasis 11/28/2010  . Cough, persistent   . Diabetes mellitus without complication ([place])   . Diverticulitis 2006  . DVT (deep venous thrombosis) ([place])   . Elevated PSA 06/2011   a. s/p TURP 01/2014.  Marland Kitchen  Gastroesophageal reflux disease    Hiatal hernia; previously treated with PPI  . Hypertension    Mild; controlled with a single agent    . Laryngeal carcinoma ([place]) 2010   resection and radiation therapy 2010-11  . Myocardial infarction ([place])   . Obesity    BMI 47; lap band surgery 2010  .  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 Sleep apnea    BiPAP mask utilized        2 yrs sleep study    dr Redmond Pulling  . Tobacco abuse, in remission    40 pack years; discontinued in 2000    Past Surgical History:  Procedure Laterality Date  . ANTERIOR LAT LUMBAR FUSION N/A 02/22/2016   Procedure: LUMBAR TWO-LUMBAR FIVE ANTERIOR LATERAL LUMBAR INTERBODY FUSION WITH PERCUTANEOUS PEDICLE SCREWS;  Surgeon: Kevan Ny Ditty, MD;  Location: [facility];  Service: Neurosurgery;  Laterality: N/A;  Left side approach  . CARDIAC CATHETERIZATION  2000   PCI-stent  . CATARACT EXTRACTION    . COLONOSCOPY  10/2007  . COLONOSCOPY WITH ESOPHAGOGASTRODUODENOSCOPY (EGD)  2009   RMR: He had distal esophageal erosions, patulous GE junction, erosive reflux esophagitis, hiatal hernia, lipoma on the angularis not manipulated, left-sided diverticula.  . CYSTOSCOPY  02/22/2016   Procedure: CYSTOSCOPY FLEXIBLE WITH URETHER DILATION  WITH CATHETER PLACEMENT;  Surgeon: Alexis Frock, MD;  Location: [facility];  Service: Urology;;  . EYE SURGERY    . FEMORAL-FEMORAL BYPASS GRAFT  04/18/2011   Procedure: BYPASS GRAFT FEMORAL-FEMORAL ARTERY;  Surgeon: Theotis Burrow, MD;  Location: [facility];  Service: Vascular;  Laterality: Bilateral;  Left to right femoral to femoral bypass   . FEMORAL-POPLITEAL BYPASS GRAFT  04/18/2011   Procedure: BYPASS GRAFT FEMORAL-POPLITEAL ARTERY;  Surgeon: Theotis Burrow, MD;  Location: [facility];  Service: Vascular;  Laterality: Right;  Right femoral popliteal bypass with propaten gortex graft  . gastic sleeve  07/2015   gastic band removed and have sleeve  . LAPAROSCOPIC GASTRIC BANDING  2010  . LARYNX SURGERY  2010   Resection of carcinoma  . LEFT HEART CATH AND CORONARY ANGIOGRAPHY N/A 09/02/2017   Procedure: LEFT HEART CATH AND CORONARY ANGIOGRAPHY;  Surgeon: Burnell Blanks, MD;  Location: [facility];  Service: Cardiovascular;  Laterality: N/A;  . LEFT HEART  CATHETERIZATION WITH CORONARY ANGIOGRAM N/A 03/10/2012   Procedure: LEFT HEART CATHETERIZATION WITH CORONARY ANGIOGRAM;  Surgeon: Burnell Blanks, MD;  Location: [facility] CATH [facility];  Service: Cardiovascular;  Laterality: N/A;  . LUMBAR PERCUTANEOUS PEDICLE SCREW 3 LEVEL N/A 02/22/2016   Procedure: LUMBAR PERCUTANEOUS PEDICLE SCREW LUMBAR TWO-LUMBAR FIVE;  Surgeon: Kevan Ny Ditty, MD;  Location: [facility];  Service: Neurosurgery;  Laterality: N/A;  . OPEN ANTERIOR SHOULDER RECONSTRUCTION  2011   Left  . PERIPHERAL ARTERIAL STENT GRAFT  2007   for abdominal aortic aneurysm  . PR VEIN BYPASS GRAFT,AORTO-FEM-POP  07-23-11   Left AK to BK popliteal BPG using rev. saphenous vein    Current Outpatient Medications  Medication Sig Dispense Refill  . acetaminophen (TYLENOL) 500 MG tablet Take 500 mg by mouth every 6 (six) hours as needed for mild pain.    Marland Kitchen aspirin EC 81 MG tablet Take 1 tablet (81 mg total) by mouth every other day.    . carvedilol (COREG) 6.25 MG tablet Take 1 tablet (6.25 mg total) by mouth 2 (two) times daily. 180 tablet 3  . Cholecalciferol (VITAMIN D) 2000 units CAPS Take 2,000  Units by mouth daily.     Marland Kitchen ezetimibe (ZETIA) 10 MG tablet Take 10 mg by mouth daily.    Marland Kitchen gabapentin (NEURONTIN) 400 MG capsule Take 400 mg by mouth at bedtime.     . hydrALAZINE (APRESOLINE) 50 MG tablet Take 1 tablet (50 mg total) by mouth See admin instructions. Patient only takes when he has elevated blood pressure readings. 30 tablet 6  . metFORMIN (GLUCOPHAGE) 500 MG tablet Take 500 mg by mouth daily as needed (High Blood sugar above 140).     . Multiple Vitamin (MULTIVITAMIN WITH MINERALS) TABS tablet Take 1 tablet by mouth daily.    . nitroGLYCERIN (NITROSTAT) 0.4 MG SL tablet Place 1 tablet (0.4 mg total) under the tongue every 5 (five) minutes as needed for chest pain. 25 tablet 3  . pantoprazole (PROTONIX) 40 MG tablet Take 40 mg by mouth every other day.    . potassium chloride SA  (K-DUR,KLOR-CON) 20 MEQ tablet TAKE 1 TABLET BY MOUTH ONCE DAILY 90 tablet 0  . torsemide (DEMADEX) 20 MG tablet Take 20 mg by mouth every other day.     . Vitamin D, Ergocalciferol, (DRISDOL) 50000 units CAPS capsule TAKE ONE CAPSULE BY MOUTH ONCE A WEEK ON TUESDAYS    . irbesartan (AVAPRO) 300 MG tablet Take 1 tablet (300 mg total) by mouth daily. 90 tablet 3   No current facility-administered medications for this visit.     Allergies  Allergen Reactions  . Finasteride Other (See Comments)    Unable to void Unable to void Unable to void  . Other Rash    Beta-blockers Beta-blockers Beta-blockers  . Statins Hives and Rash  . Aspirin Other (See Comments)    Cannot take daily, just once in a while Cannot take daily, just once in a while Other reaction(s): GI Upset (intolerance) Cannot take daily, just once in a while Daily dose upsets stomach    Social History   Socioeconomic History  . Marital status: Married    Spouse name: Not on file  . Number of children: 3  . Years of education: Not on file  . Highest education level: Not on file  Occupational History  . Occupation: Retired, part Doctor, general practice  Social Needs  . Financial resource strain: Not on file  . Food insecurity:    Worry: Not on file    Inability: Not on file  . Transportation needs:    Medical: Not on file    Non-medical: Not on file  Tobacco Use  . Smoking status: Former Smoker    Packs/day: 2.00    Years: 30.00    Pack years: 60.00    Types: Cigarettes    Last attempt to quit: 11/26/1997    Years since quitting: 20.5  . Smokeless tobacco: Never Used  . Tobacco comment: 40 pack years; quit 2000  Substance and Sexual Activity  . Alcohol use: No    Alcohol/week: 0.0 standard drinks  . Drug use: No  . Sexual activity: Yes  Lifestyle  . Physical activity:    Days per week: Not on file    Minutes per session: Not on file  . Stress: Not on file  Relationships  . Social connections:    Talks on  phone: Not on file    Gets together: Not on file    Attends religious service: Not on file    Active member of club or organization: Not on file    Attends meetings of clubs or organizations:  Not on file    Relationship status: Not on file  . Intimate partner violence:    Fear of current or ex partner: Not on file    Emotionally abused: Not on file    Physically abused: Not on file    Forced sexual activity: Not on file  Other Topics Concern  . Not on file  Social History Narrative   Married, past employed, does not get regular exercise.     Family History  Problem Relation Age of Onset  . Heart attack Father        deceased  . Heart failure Father   . Hyperlipidemia Father   . Hypertension Father   . Heart disease Father   . Lung cancer Mother        deceased   . Hypertension Mother   . Hyperlipidemia Mother   . Heart failure Mother   . Diabetes Mother   . Cancer Sister        vaginal  . Diabetes Sister   . Colon cancer Neg Hx     Review of Systems:  As stated in the HPI and otherwise negative.   BP 127/82   Pulse 69   Ht 5\' 11"  (1.803 m)   Wt (!) 313 lb 3.2 oz (142.1 kg)   SpO2 97%   BMI 43.68 kg/m   Physical Examination: General: Well developed, well nourished, NAD  HEENT: OP clear, mucus membranes moist  SKIN: warm, dry. No rashes. Neuro: No focal deficits  Musculoskeletal: Muscle strength 5/5 all ext  Psychiatric: Mood and affect normal  Neck: No JVD, no carotid bruits, no thyromegaly, no lymphadenopathy.  Lungs:Clear bilaterally, no wheezes, rhonci, crackles Cardiovascular: Regular rate and rhythm. No murmurs, gallops or rubs. Abdomen:Soft. Bowel sounds present. Non-tender.  Extremities: No lower extremity edema. Pulses are 2 + in the bilateral DP/PT.  Echo 04/20/18:  - Left ventricle: Diffuse hypokinesis worse in the septum and apex.   The cavity size was mildly dilated. Wall thickness was normal.   Systolic function was mildly to moderately  reduced. The estimated   ejection fraction was in the range of 40% to 45%. Left   ventricular diastolic function parameters were normal. Internal   dimension, ED (PLAX chordal): 55 mm. - Aortic valve: There was trivial regurgitation. - Left atrium: The atrium was mildly dilated. - Atrial septum: No defect or patent foramen ovale was identified.  EKG:  EKG is not ordered today. The ekg ordered today demonstrates   Recent Labs: 08/28/2017: Hemoglobin 15.9; Platelets 165 11/17/2017: ALT 24 12/29/2017: BUN 13; Creatinine, Ser 0.87; Magnesium 2.0; Potassium 4.1; Sodium 141   Lipid Panel Followed in primary care   Wt Readings from Last 3 Encounters:  05/27/18 (!) 313 lb 3.2 oz (142.1 kg)  01/20/18 (!) 302 lb 1.9 oz (137 kg)  12/29/17 298 lb 12.8 oz (135.5 kg)     Other studies Reviewed: Additional studies/ records that were reviewed today include: . Review of the above records demonstrates:    Assessment and Plan:   1. CAD without angina: Cardiac cath May 2019 with moderate mid LAD stenosis, chronic occlusion of the distal LAD, chronic occlusion of the RCA and mild disease in the Circumflex artery. No lesions optimal for PCI. No chest pain. Continue ASA, beta blocker and ARB. He does not tolerate statins. He has been on Zetia.    2. Carotid artery disease: Mild bilateral disease by carotid dopplers October 2019. Repeat in October 2021.    3.  PAD: Followed in [facility]VS. No leg pain with normal activities.   4. Ischemic Cardiomyopathy/Chronic diastolic/systolic CHF: His LV systolic function has varied between 35 and 45% over the last few years. Echo December 2019 with LVEF=45%. No good options for revascularization on cath May 2019. He remains NYHA class 1-2 on Coreg and Avapro. He was started on aldactone following his office visit with me in September 2019 but did not tolerate due to foot pain and itching. This was stopped. He remains on torsemide every other day. Volume status is stable. Can  consider adding Entresto in the future if he becomes more symptomatic. 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   5. Hyperlipidemia: He is intolerant to statins. LDL near goal of 70. Continue Zetia.   6. HTN:  BP is is elevated at home. Will increase Avapro to 300 mg daily. BMET one week.   Current medicines are reviewed at length with the patient today.  The patient does not have concerns regarding medicines.  The following changes have been made:  no change  Labs/ tests ordered today include:   Orders Placed This Encounter  Procedures  . Basic metabolic panel    Disposition:   FU with me in 6 months.   Signed, Lauree Chandler, MD 05/27/2018 8:36 AM    [facility] [address], [place], [place]  [address] Phone: [phone]; Fax: [phone]

## 2018-05-27 ENCOUNTER — Telehealth: Payer: Self-pay | Admitting: Cardiovascular Disease

## 2018-05-27 ENCOUNTER — Ambulatory Visit (INDEPENDENT_AMBULATORY_CARE_PROVIDER_SITE_OTHER): Payer: Medicare Other | Admitting: Cardiovascular Disease

## 2018-05-27 ENCOUNTER — Encounter: Payer: Self-pay | Admitting: Cardiovascular Disease

## 2018-05-27 VITALS — BP 127/82 | HR 69 | Ht 71.0 in | Wt 313.2 lb

## 2018-05-27 DIAGNOSIS — I2510 Atherosclerotic heart disease of native coronary artery without angina pectoris: Secondary | ICD-10-CM

## 2018-05-27 DIAGNOSIS — I739 Peripheral vascular disease, unspecified: Secondary | ICD-10-CM

## 2018-05-27 DIAGNOSIS — I255 Ischemic cardiomyopathy: Secondary | ICD-10-CM

## 2018-05-27 DIAGNOSIS — I6523 Occlusion and stenosis of bilateral carotid arteries: Secondary | ICD-10-CM | POA: Diagnosis not present

## 2018-05-27 DIAGNOSIS — I10 Essential (primary) hypertension: Secondary | ICD-10-CM | POA: Diagnosis not present

## 2018-05-27 DIAGNOSIS — I5042 Chronic combined systolic (congestive) and diastolic (congestive) heart failure: Secondary | ICD-10-CM

## 2018-05-27 DIAGNOSIS — E785 Hyperlipidemia, unspecified: Secondary | ICD-10-CM

## 2018-05-27 MED ORDER — NITROGLYCERIN 0.4 MG SL SUBL: 0.4000 mg | SUBLINGUAL_TABLET | SUBLINGUAL | 3 refills | Status: AC | PRN

## 2018-05-27 MED ORDER — IRBESARTAN 300 MG PO TABS: 300.0000 mg | ORAL_TABLET | Freq: Every day | ORAL | 3 refills | Status: AC

## 2018-05-27 NOTE — Telephone Encounter (Signed)
Spoke with pt and made him aware letter was ready.  He will come by the office tomorrow to pick up.

## 2018-05-27 NOTE — Telephone Encounter (Signed)
I have written a letter. Gerald Stabs

## 2018-05-27 NOTE — Telephone Encounter (Signed)
New Message   Patient states he needs a return to work note indicating his heart is strong enough to be back at work.

## 2018-05-27 NOTE — Patient Instructions (Signed)
Medication Instructions:  1) INCREASE Irbesartan to 300mg  once daily  If you need a refill on your cardiac medications before your next appointment, please call your pharmacy.   Lab work: Your physician recommends that you return for lab work in: 10 days. (BMET)   If you have labs (blood work) drawn today and your tests are completely normal, you will receive your results only by: Marland Kitchen MyChart Message (if you have MyChart) OR . A paper copy in the mail If you have any lab test that is abnormal or we need to change your treatment, we will call you to review the results.  Testing/Procedures: None  Follow-Up: At [facility], you and your health needs are our priority.  As part of our continuing mission to provide you with exceptional heart care, we have created designated Provider Care Teams.  These Care Teams include your primary Cardiologist (physician) and Advanced Practice Providers (APPs -  Physician Assistants and Nurse Practitioners) who all work together to provide you with the care you need, when you need it. You will need a follow up appointment in 6 months.  Please call our office 2 months in advance to schedule this appointment.  You may see Lauree Chandler, MD or one of the following Advanced Practice Providers on your designated Care Team:   [place], PA-C Melina Copa, PA-C . Ermalinda Barrios, PA-C  Any Other Special Instructions Will Be Listed Below (If Applicable).

## 2018-05-27 NOTE — Telephone Encounter (Signed)
Pt just saw Dr. Angelena Form today

## 2018-05-29 ENCOUNTER — Emergency Department (HOSPITAL_COMMUNITY): Payer: Medicare Other

## 2018-05-29 ENCOUNTER — Inpatient Hospital Stay (HOSPITAL_COMMUNITY)
Admission: EM | Admit: 2018-05-29 | Discharge: 2018-05-31 | DRG: 872 | Disposition: A | Discharge status: Home / Self Care | Payer: Medicare Other | Attending: Internal Medicine | Admitting: Internal Medicine

## 2018-05-29 ENCOUNTER — Encounter (HOSPITAL_COMMUNITY): Payer: Self-pay | Admitting: Emergency Medicine

## 2018-05-29 ENCOUNTER — Other Ambulatory Visit: Payer: Self-pay

## 2018-05-29 DIAGNOSIS — I255 Ischemic cardiomyopathy: Secondary | ICD-10-CM | POA: Diagnosis present

## 2018-05-29 DIAGNOSIS — Z8521 Personal history of malignant neoplasm of larynx: Secondary | ICD-10-CM

## 2018-05-29 DIAGNOSIS — Z9884 Bariatric surgery status: Secondary | ICD-10-CM | POA: Diagnosis not present

## 2018-05-29 DIAGNOSIS — Z923 Personal history of irradiation: Secondary | ICD-10-CM

## 2018-05-29 DIAGNOSIS — Z9849 Cataract extraction status, unspecified eye: Secondary | ICD-10-CM | POA: Diagnosis not present

## 2018-05-29 DIAGNOSIS — G4730 Sleep apnea, unspecified: Secondary | ICD-10-CM | POA: Diagnosis present

## 2018-05-29 DIAGNOSIS — M7989 Other specified soft tissue disorders: Secondary | ICD-10-CM | POA: Diagnosis not present

## 2018-05-29 DIAGNOSIS — L0390 Cellulitis, unspecified: Secondary | ICD-10-CM

## 2018-05-29 DIAGNOSIS — L03116 Cellulitis of left lower limb: Secondary | ICD-10-CM | POA: Diagnosis present

## 2018-05-29 DIAGNOSIS — I252 Old myocardial infarction: Secondary | ICD-10-CM | POA: Diagnosis not present

## 2018-05-29 DIAGNOSIS — I2510 Atherosclerotic heart disease of native coronary artery without angina pectoris: Secondary | ICD-10-CM | POA: Diagnosis present

## 2018-05-29 DIAGNOSIS — Z981 Arthrodesis status: Secondary | ICD-10-CM

## 2018-05-29 DIAGNOSIS — I110 Hypertensive heart disease with heart failure: Secondary | ICD-10-CM | POA: Diagnosis present

## 2018-05-29 DIAGNOSIS — E785 Hyperlipidemia, unspecified: Secondary | ICD-10-CM | POA: Diagnosis present

## 2018-05-29 DIAGNOSIS — A419 Sepsis, unspecified organism: Secondary | ICD-10-CM | POA: Diagnosis not present

## 2018-05-29 DIAGNOSIS — K219 Gastro-esophageal reflux disease without esophagitis: Secondary | ICD-10-CM | POA: Diagnosis present

## 2018-05-29 DIAGNOSIS — Z888 Allergy status to other drugs, medicaments and biological substances status: Secondary | ICD-10-CM

## 2018-05-29 DIAGNOSIS — K8020 Calculus of gallbladder without cholecystitis without obstruction: Secondary | ICD-10-CM | POA: Diagnosis present

## 2018-05-29 DIAGNOSIS — Z87891 Personal history of nicotine dependence: Secondary | ICD-10-CM

## 2018-05-29 DIAGNOSIS — E876 Hypokalemia: Secondary | ICD-10-CM | POA: Diagnosis present

## 2018-05-29 DIAGNOSIS — Z801 Family history of malignant neoplasm of trachea, bronchus and lung: Secondary | ICD-10-CM

## 2018-05-29 DIAGNOSIS — E1165 Type 2 diabetes mellitus with hyperglycemia: Secondary | ICD-10-CM | POA: Diagnosis not present

## 2018-05-29 DIAGNOSIS — Z7982 Long term (current) use of aspirin: Secondary | ICD-10-CM | POA: Diagnosis not present

## 2018-05-29 DIAGNOSIS — Z7984 Long term (current) use of oral hypoglycemic drugs: Secondary | ICD-10-CM | POA: Diagnosis not present

## 2018-05-29 DIAGNOSIS — Z833 Family history of diabetes mellitus: Secondary | ICD-10-CM

## 2018-05-29 DIAGNOSIS — Z8679 Personal history of other diseases of the circulatory system: Secondary | ICD-10-CM

## 2018-05-29 DIAGNOSIS — Z8249 Family history of ischemic heart disease and other diseases of the circulatory system: Secondary | ICD-10-CM

## 2018-05-29 DIAGNOSIS — I5022 Chronic systolic (congestive) heart failure: Secondary | ICD-10-CM | POA: Diagnosis present

## 2018-05-29 DIAGNOSIS — M79672 Pain in left foot: Secondary | ICD-10-CM | POA: Diagnosis not present

## 2018-05-29 DIAGNOSIS — Z8701 Personal history of pneumonia (recurrent): Secondary | ICD-10-CM

## 2018-05-29 DIAGNOSIS — R109 Unspecified abdominal pain: Secondary | ICD-10-CM

## 2018-05-29 DIAGNOSIS — E119 Type 2 diabetes mellitus without complications: Secondary | ICD-10-CM

## 2018-05-29 DIAGNOSIS — Z86718 Personal history of other venous thrombosis and embolism: Secondary | ICD-10-CM

## 2018-05-29 DIAGNOSIS — D696 Thrombocytopenia, unspecified: Secondary | ICD-10-CM | POA: Diagnosis present

## 2018-05-29 DIAGNOSIS — E1151 Type 2 diabetes mellitus with diabetic peripheral angiopathy without gangrene: Secondary | ICD-10-CM | POA: Diagnosis not present

## 2018-05-29 DIAGNOSIS — Z8349 Family history of other endocrine, nutritional and metabolic diseases: Secondary | ICD-10-CM

## 2018-05-29 DIAGNOSIS — Z79899 Other long term (current) drug therapy: Secondary | ICD-10-CM

## 2018-05-29 DIAGNOSIS — I10 Essential (primary) hypertension: Secondary | ICD-10-CM | POA: Diagnosis present

## 2018-05-29 HISTORY — DX: Type 2 diabetes mellitus without complications: E11.9

## 2018-05-29 LAB — COMPREHENSIVE METABOLIC PANEL
ALBUMIN: 3.8 g/dL (ref 3.5–5.0)
ALT: 29 U/L (ref 0–44)
AST: 27 U/L (ref 15–41)
Alkaline Phosphatase: 65 U/L (ref 38–126)
Anion gap: 10 (ref 5–15)
BUN: 20 mg/dL (ref 8–23)
CO2: 24 mmol/L (ref 22–32)
Calcium: 8.1 mg/dL — ABNORMAL LOW (ref 8.9–10.3)
Chloride: 101 mmol/L (ref 98–111)
Creatinine, Ser: 0.97 mg/dL (ref 0.61–1.24)
GFR calc Af Amer: >60 mL/min (ref >60)
GFR calc non Af Amer: >60 mL/min (ref >60)
GLUCOSE: 171 mg/dL — AB (ref 70–99)
Potassium: 3.4 mmol/L — ABNORMAL LOW (ref 3.5–5.1)
Sodium: 135 mmol/L (ref 135–145)
Total Bilirubin: 0.2 mg/dL — ABNORMAL LOW (ref 0.3–1.2)
Total Protein: 6.9 g/dL (ref 6.5–8.1)

## 2018-05-29 LAB — URINALYSIS, ROUTINE W REFLEX MICROSCOPIC
Bilirubin Urine: NEGATIVE
Glucose, UA: NEGATIVE mg/dL
Hgb urine dipstick: NEGATIVE
Ketones, ur: NEGATIVE mg/dL
Leukocytes, UA: NEGATIVE
Nitrite: NEGATIVE
PH: 5 (ref 5.0–8.0)
Protein, ur: NEGATIVE mg/dL
Specific Gravity, Urine: 1.008 (ref 1.005–1.030)

## 2018-05-29 LAB — LACTIC ACID, PLASMA: Lactic Acid, Venous: 3.2 mmol/L (ref 0.5–1.9)

## 2018-05-29 MED ORDER — VANCOMYCIN HCL IN DEXTROSE 1-5 GM/200ML-% IV SOLN
1000.0000 mg | Freq: Once | INTRAVENOUS | Status: AC
  Administered 2018-05-29: 1000 mg via INTRAVENOUS
  Filled 2018-05-29: qty 200

## 2018-05-29 MED ORDER — SODIUM CHLORIDE 0.9 % IV SOLN
2.0000 g | INTRAVENOUS | Status: DC
  Administered 2018-05-29 – 2018-05-30 (×2): 2 g via INTRAVENOUS
  Filled 2018-05-29 (×4): qty 20

## 2018-05-29 MED ORDER — SODIUM CHLORIDE 0.9% FLUSH
3.0000 mL | Freq: Once | INTRAVENOUS | Status: AC
  Administered 2018-05-29: 3 mL via INTRAVENOUS

## 2018-05-29 MED ORDER — SODIUM CHLORIDE 0.9 % IV SOLN
INTRAVENOUS | Status: DC | PRN
  Administered 2018-05-29: 250 mL via INTRAVENOUS

## 2018-05-29 MED ORDER — VANCOMYCIN HCL IN DEXTROSE 1-5 GM/200ML-% IV SOLN
1000.0000 mg | Freq: Once | INTRAVENOUS | Status: AC
  Administered 2018-05-30: 1000 mg via INTRAVENOUS
  Filled 2018-05-29: qty 200

## 2018-05-29 NOTE — ED Triage Notes (Signed)
Pt complaining of left leg redness and swelling for two days. Denies history of same.

## 2018-05-29 NOTE — ED Notes (Signed)
CRITICAL VALUE ALERT  Critical Value:  Lactic Acid 3.2  Date & Time Notied:  05/29/18 & 2218 hrs  Provider Notified: Dr. Laverta Baltimore  Orders Received/Actions taken: N/A

## 2018-05-29 NOTE — ED Notes (Signed)
Family at bedside. 

## 2018-05-29 NOTE — H&P (Signed)
History and Physical    Mathew Padilla:[id number] DOB: 13-Nov-1950 DOA: 05/29/2018  PCP: Doree Albee, MD   Patient coming from: Home.  I have personally briefly reviewed patient's old medical records in [place]  Chief Complaint: Left foot pain.  HPI: Mathew Padilla is a 68 y.o. 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  Not sure about fever, but positive chills, and feels fatigued.  No rhinorrhea, wheezing, hemoptysis, chest pain, palpitations, diaphoresis, PND, orthopnea, but stated he occasionally gets pitting edema of the lower extremities.  Denies abdominal pain, nausea or emesis, diarrhea, constipation, melena or hematochezia.  No dysuria, frequency or hematuria.  No polyuria, polydipsia, polyphagia or blurred vision.  ED Course: Initial vital signs temperature 98 F, pulse 87, respirations 20, blood pressure 108/53 mmHg and O2 sat 100% on room air.  He received IV fluids, ceftriaxone and vancomycin in the emergency department.  His urinalysis was unremarkable.  CMP shows a potassium of 3.4 mmol/L.  Total bilirubin was 0.2, glucose 171 and calcium 8.1 mg/dL.  The rest of the values of the CMP are normal.  Initial lactic acid was 3.2 and follow-up lactic acid was 2.5 mmol/L.  Blood cultures x2 were taken.  Review of Systems: As per HPI otherwise 10 point review of systems negative.   Past Medical  History:  Diagnosis Date  . AAA (abdominal aortic aneurysm) ([place]) 2007   stent graft repair 12/07  . Arteriosclerotic cardiovascular disease (ASCVD)    a. MI in 1999;  b.  PTCA of PDA and 2nd marginal in 3/00;  c. 02/2012 Cath: LM nl, LAD 72m, LCX 37m, OM small, 70, RCA 99p/182m-->Med Rx;  d. 03/2014 MV: EF 32%, inflat infarct w/ mild peri-infarct isch towards apex (similar to 2013 MV); d. 03/2014 Echo: EF 45-50%, Gr 1 DD, mildly dil LA.  Marland Kitchen CAD (coronary artery disease)   . Cerebrovascular disease   . CHF (congestive heart failure) ([place])   . Cholelithiasis 11/28/2010  . Cough, persistent   . Diverticulitis 2006  . DVT (deep venous thrombosis) ([place])   . Elevated PSA 06/2011   a. s/p TURP 01/2014.  Marland Kitchen Gastroesophageal reflux disease    Hiatal hernia; previously treated with PPI  . Hypertension    Mild; controlled with a single agent    . Laryngeal carcinoma ([place]) 2010   resection and radiation therapy 2010-11  . Myocardial infarction ([place])   . Obesity    BMI 47; lap band surgery 2010  . Popliteal aneurysm ([place])    popliteal bypass-2013  . Sleep apnea    BiPAP mask utilized        2 yrs sleep study    dr Redmond Pulling  . Tobacco abuse, in remission    40 pack years; discontinued in 2000  . Type 2 diabetes mellitus ([place])     Past Surgical History:  Procedure Laterality Date  . ANTERIOR LAT LUMBAR FUSION N/A 02/22/2016   Procedure: LUMBAR TWO-LUMBAR FIVE ANTERIOR LATERAL LUMBAR INTERBODY FUSION  WITH PERCUTANEOUS PEDICLE SCREWS;  Surgeon: Kevan Ny Ditty, MD;  Location: [facility];  Service: Neurosurgery;  Laterality: N/A;  Left side approach  . CARDIAC CATHETERIZATION  2000   PCI-stent  . CATARACT EXTRACTION    . COLONOSCOPY  10/2007  . COLONOSCOPY WITH ESOPHAGOGASTRODUODENOSCOPY (EGD)  2009   RMR: He had distal esophageal erosions, patulous GE junction, erosive reflux esophagitis, hiatal hernia, lipoma on the angularis not manipulated, left-sided diverticula.  . CYSTOSCOPY  02/22/2016    Procedure: CYSTOSCOPY FLEXIBLE WITH URETHER DILATION  WITH CATHETER PLACEMENT;  Surgeon: Alexis Frock, MD;  Location: [facility];  Service: Urology;;  . EYE SURGERY    . FEMORAL-FEMORAL BYPASS GRAFT  04/18/2011   Procedure: BYPASS GRAFT FEMORAL-FEMORAL ARTERY;  Surgeon: Theotis Burrow, MD;  Location: [facility];  Service: Vascular;  Laterality: Bilateral;  Left to right femoral to femoral bypass   . FEMORAL-POPLITEAL BYPASS GRAFT  04/18/2011   Procedure: BYPASS GRAFT FEMORAL-POPLITEAL ARTERY;  Surgeon: Theotis Burrow, MD;  Location: [facility];  Service: Vascular;  Laterality: Right;  Right femoral popliteal bypass with propaten gortex graft  . gastic sleeve  07/2015   gastic band removed and have sleeve  . LAPAROSCOPIC GASTRIC BANDING  2010  . LARYNX SURGERY  2010   Resection of carcinoma  . LEFT HEART CATH AND CORONARY ANGIOGRAPHY N/A 09/02/2017   Procedure: LEFT HEART CATH AND CORONARY ANGIOGRAPHY;  Surgeon: Burnell Blanks, MD;  Location: [facility];  Service: Cardiovascular;  Laterality: N/A;  . LEFT HEART CATHETERIZATION WITH CORONARY ANGIOGRAM N/A 03/10/2012   Procedure: LEFT HEART CATHETERIZATION WITH CORONARY ANGIOGRAM;  Surgeon: Burnell Blanks, MD;  Location: [facility] C[facility];  Service: Cardiovascular;  Laterality: N/A;  . LUMBAR PERCUTANEOUS PEDICLE SCREW 3 LEVEL N/A 02/22/2016   Procedure: LUMBAR PERCUTANEOUS PEDICLE SCREW LUMBAR TWO-LUMBAR FIVE;  Surgeon: Kevan Ny Ditty, MD;  Location: [facility];  Service: Neurosurgery;  Laterality: N/A;  . OPEN ANTERIOR SHOULDER RECONSTRUCTION  2011   Left  . PERIPHERAL ARTERIAL STENT GRAFT  2007   for abdominal aortic aneurysm  . PR VEIN BYPASS GRAFT,AORTO-FEM-POP  07-23-11   Left AK to BK popliteal BPG using rev. saphenous vein     reports that he quit smoking about 20 years ago. His smoking use included cigarettes. He has a 60.00 pack-year smoking history. He has never used smokeless tobacco. He reports that he does not drink alcohol  or use drugs.  Allergies  Allergen Reactions  . Finasteride Other (See Comments)    Unable to void Unable to void Unable to void  . Other Rash    Beta-blockers Beta-blockers Beta-blockers  . Statins Hives and Rash  . Aspirin Other (See Comments)    Cannot take daily, just once in a while Cannot take daily, just once in a while Other reaction(s): GI Upset (intolerance) Cannot take daily, just once in a while Daily dose upsets stomach    Family History  Problem Relation Age of Onset  . Heart attack Father        deceased  . Heart failure Father   . Hyperlipidemia Father   . Hypertension Father   . Heart disease Father   . Lung cancer Mother        deceased   . Hypertension Mother   . Hyperlipidemia Mother   . Heart failure Mother   . Diabetes Mother   . Cancer Sister        vaginal  . Diabetes Sister   .  Colon cancer Neg Hx    Prior to Admission medications   Medication Sig Start Date End Date Taking? Authorizing Provider  acetaminophen (TYLENOL) 500 MG tablet Take 500 mg by mouth every 6 (six) hours as needed for mild pain.    [provider]  aspirin EC 81 MG tablet Take 1 tablet (81 mg total) by mouth every other day. 08/14/17   Richardson Dopp T, PA-C  carvedilol (COREG) 6.25 MG tablet Take 1 tablet (6.25 mg total) by mouth 2 (two) times daily. 12/29/17   Richardson Dopp T, PA-C  Cholecalciferol (VITAMIN D) 2000 units CAPS Take 2,000 Units by mouth daily.     [provider]  ezetimibe (ZETIA) 10 MG tablet Take 10 mg by mouth daily.    [provider]  gabapentin (NEURONTIN) 400 MG capsule Take 400 mg by mouth at bedtime.     [provider]  hydrALAZINE (APRESOLINE) 50 MG tablet Take 1 tablet (50 mg total) by mouth See admin instructions. Patient only takes when he has elevated blood pressure readings. 03/19/18   Burnell Blanks, MD  irbesartan (AVAPRO) 300 MG tablet Take 1 tablet (300 mg total) by mouth daily. 05/27/18    Burnell Blanks, MD  metFORMIN (GLUCOPHAGE) 500 MG tablet Take 500 mg by mouth daily as needed (High Blood sugar above 140).     [provider]  Multiple Vitamin (MULTIVITAMIN WITH MINERALS) TABS tablet Take 1 tablet by mouth daily.    [provider]  nitroGLYCERIN (NITROSTAT) 0.4 MG SL tablet Place 1 tablet (0.4 mg total) under the tongue every 5 (five) minutes as needed for chest pain. 05/27/18   Burnell Blanks, MD  pantoprazole (PROTONIX) 40 MG tablet Take 40 mg by mouth every other day.    [provider]  potassium chloride SA (K-DUR,KLOR-CON) 20 MEQ tablet TAKE 1 TABLET BY MOUTH ONCE DAILY 05/13/18   Burnell Blanks, MD  torsemide (DEMADEX) 20 MG tablet Take 20 mg by mouth every other day.     [provider]  Vitamin D, Ergocalciferol, (DRISDOL) 50000 units CAPS capsule TAKE ONE CAPSULE BY MOUTH ONCE A WEEK ON TUESDAYS 02/20/17   [provider]    Physical Exam: Vitals:   05/29/18 2012 05/29/18 2014  BP: (!) 108/53   Pulse: 87   Resp: 20   Temp: 98 F (36.7 C)   TempSrc: Oral   SpO2: 100%   Weight:  (!) 141.1 kg  Height:  5\' 11"  (1.803 m)    Constitutional: NAD, calm, comfortable Eyes: PERRL, lids and conjunctivae normal ENMT: Mucous membranes are moist. Posterior pharynx clear of any exudate or lesions. Neck: normal, supple, no masses, no thyromegaly Respiratory: Decreased breath sounds in bases, otherwise clear to auscultation bilaterally, no wheezing, no crackles. Normal respiratory effort. No accessory muscle use.  Cardiovascular: Regular rate and rhythm, no murmurs / rubs / gallops. No extremity edema. 2+ pedal pulses. No carotid bruits.  Abdomen: Obese, soft, no tenderness, no masses palpated. No hepatosplenomegaly. Bowel sounds positive.  Musculoskeletal: no clubbing / cyanosis. No joint deformity upper and lower extremities. Good ROM, no contractures. Normal muscle tone.  Skin: Positive erythema,  edema, calor and TTP on left pretibial area.  Positive left big toe plantar aspect 1.  Positive for right big toe wound.  Please see pictures below. Neurologic: CN 2-12 grossly intact. Sensation intact, DTR normal. Strength 5/5 in all 4.  Psychiatric: Normal judgment and insight. Alert and oriented x 3. Normal  mood.          Labs on Admission: I have personally reviewed following labs and imaging studies  CBC: No results for input(s): WBC, NEUTROABS, HGB, HCT, MCV, PLT in the last 168 hours. Basic Metabolic Panel: Recent Labs  Lab 05/29/18 2145  NA 135  K 3.4*  CL 101  CO2 24  GLUCOSE 171*  BUN 20  CREATININE 0.97  CALCIUM 8.1*   GFR: Estimated Creatinine Clearance: 104.7 mL/min (by C-G formula based on SCr of 0.97 mg/dL). Liver Function Tests: Recent Labs  Lab 05/29/18 2145  AST 27  ALT 29  ALKPHOS 65  BILITOT 0.2*  PROT 6.9  ALBUMIN 3.8   No results for input(s): LIPASE, AMYLASE in the last 168 hours. No results for input(s): AMMONIA in the last 168 hours. Coagulation Profile: No results for input(s): INR, PROTIME in the last 168 hours. Cardiac Enzymes: No results for input(s): CKTOTAL, CKMB, CKMBINDEX, TROPONINI in the last 168 hours. BNP (last 3 results) No results for input(s): PROBNP in the last 8760 hours. HbA1C: No results for input(s): HGBA1C in the last 72 hours. CBG: No results for input(s): GLUCAP in the last 168 hours. Lipid Profile: No results for input(s): CHOL, HDL, LDLCALC, TRIG, CHOLHDL, LDLDIRECT in the last 72 hours. Thyroid Function Tests: No results for input(s): TSH, T4TOTAL, FREET4, T3FREE, THYROIDAB in the last 72 hours. Anemia Panel: No results for input(s): VITAMINB12, FOLATE, FERRITIN, TIBC, IRON, RETICCTPCT in the last 72 hours. Urine analysis:    Component Value Date/Time   COLORURINE STRAW (A) 05/29/2018 2200   APPEARANCEUR CLEAR 05/29/2018 2200   LABSPEC 1.008 05/29/2018 2200   PHURINE 5.0 05/29/2018 2200   GLUCOSEU  NEGATIVE 05/29/2018 2200   HGBUR NEGATIVE 05/29/2018 2200   BILIRUBINUR NEGATIVE 05/29/2018 2200   KETONESUR NEGATIVE 05/29/2018 2200   PROTEINUR NEGATIVE 05/29/2018 2200   UROBILINOGEN 0.2 01/20/2014 1802   NITRITE NEGATIVE 05/29/2018 2200   LEUKOCYTESUR NEGATIVE 05/29/2018 2200    Radiological Exams on Admission: Dg Foot Complete Left  Result Date: 05/29/2018 CLINICAL DATA:  Acute LEFT foot pain and swelling for 2 days. EXAM: LEFT FOOT - COMPLETE 3+ VIEW COMPARISON:  None. FINDINGS: There is no evidence of fracture or dislocation. There is no evidence of arthropathy or other focal bone abnormality. Soft tissues are unremarkable. IMPRESSION: Negative. Electronically Signed   By: Margarette Canada M.D.   On: 05/29/2018 22:52   Ct Renal Stone Study  Result Date: 05/29/2018 CLINICAL DATA:  68 year old male with acute LEFT abdominal and pelvic pain for several days. EXAM: CT ABDOMEN AND PELVIS WITHOUT CONTRAST TECHNIQUE: Multidetector CT imaging of the abdomen and pelvis was performed following the standard protocol without IV contrast. COMPARISON:  07/15/2016 and prior CTs FINDINGS: Please note that parenchymal abnormalities may be missed without intravenous contrast. Lower chest: Mild cardiomegaly and coronary artery calcifications again noted. No acute abnormality. Hepatobiliary: No hepatic abnormalities identified. Cholelithiasis noted without CT evidence of acute cholecystitis. No biliary dilatation. Pancreas: Unremarkable Spleen: No significant abnormality Adrenals/Urinary Tract: The kidneys and adrenal glands are unremarkable. No hydronephrosis or urinary calculi. Mild circumferential bladder wall thickening again noted. Stomach/Bowel: Gastric surgical changes again noted. No bowel obstruction, definite bowel wall thickening or inflammatory changes. Vascular/Lymphatic: Aorto bi-iliac stent graft again identified. No interval change. Aortic caliber is unchanged. No enlarged lymph nodes identified.  Reproductive: Prostate enlargement and surgical changes again noted. Other: No ascites, focal collection or pneumoperitoneum. Musculoskeletal: No acute or suspicious abnormalities. Lumbar spine surgical changes and hardware  again identified. IMPRESSION: 1. No evidence of acute abnormality. 2. Cholelithiasis without CT evidence of acute cholecystitis 3. Cardiomegaly, coronary disease and aorto bi-iliac stent graft again noted. No changes identified. 4. Enlarged prostate and surgical changes again noted. Electronically Signed   By: Margarette Canada M.D.   On: 05/29/2018 22:51   04/20/2018 echocardiogram complete ------------------------------------------------------------------- LV EF: 40% -   45%  ------------------------------------------------------------------- Indications:      I25.5 Ischemic cardiomyopathy.  ECHO WITH DEFINITY.  ------------------------------------------------------------------- History:   PMH:  Acquired from the patient and from the patient&'s chart.  PMH:  AAA. Coronary artery disease. Congestive heart failure. DVT. Myocardial Infarction. Sleep apnea-BiPAP.  Risk factors:  Former tobacco use. Diabetes mellitus. Morbidly obese.   ------------------------------------------------------------------- Study Conclusions  - Left ventricle: Diffuse hypokinesis worse in the septum and apex.   The cavity size was mildly dilated. Wall thickness was normal.   Systolic function was mildly to moderately reduced. The estimated   ejection fraction was in the range of 40% to 45%. Left   ventricular diastolic function parameters were normal. Internal   dimension, ED (PLAX chordal): 55 mm. - Aortic valve: There was trivial regurgitation. - Left atrium: The atrium was mildly dilated. - Atrial septum: No defect or patent foramen ovale was identified.  EKG: Independently reviewed.    Assessment/Plan Principal Problem:   Sepsis due to cellulitis ([place]) Admit to  telemetry/inpatient. Continue gentle IV fluids. Continue ceftriaxone 2 g IVPB. Continue vancomycin per pharmacy. Analgesics as needed.  Active Problems:   Essential hypertension Continue carvedilol 6.25 mg p.o. twice daily. Continue Avapro 300 mg p.o. daily. Hold torsemide for now. Monitor BP, HR, renal function and electrolytes.    CAD (coronary artery disease) Continue aspirin, beta-blocker and statin.    Gastroesophageal reflux disease Protonix 40 mg p.o. daily.    Cholelithiasis This is a known issue. He is asymptomatic.    Hyperlipidemia Continue zetia 10 mg p.o. daily.    Type 2 diabetes mellitus ([facility]) Carbohydrate modified diet. Hold metformin. CBG monitoring regular insulin sliding scale.    Hypokalemia rePlacing. Follow-up potassium level.    Hypocalcemia Check vitamin D level.     DVT prophylaxis: Heparin SQ. Code Status: Full code. Family Communication: His daughter and his spouse were present in the room. Disposition Plan: Admit for 2 to 3 days of IV antibiotics and wound care. Consults called: Admission status: Inpatient/telemetry.   Reubin Milan MD [facility]  05/29/2018, 11:32 PM

## 2018-05-29 NOTE — ED Provider Notes (Signed)
Emergency Department Provider Note   I have reviewed the triage vital signs and the nursing notes.   HISTORY  Chief Complaint Cellulitis   HPI Mathew Padilla is a 68 y.o. male with PMH of AAA, CAD, CHD, DM, DVT, HTN, and popliteal aneurysm s/p bypass in 2013 followed by Dr. Oneida Alar at [facility] presents to the emergency department with unilateral left leg swelling with redness and warmth.  Patient has had some associated left flank pain.  He noticed swelling in the left lower extremity over the past 2 to 3 days but redness developed today.  It seems to be rapidly progressing.  He has not recorded any fever at home but has felt very fatigued.  He notes intermittent left flank pain without obvious provoking or modifying factors.  No dysuria, hesitancy, urgency.  Patient's wife has noticed a wound developing at the bottom of the left great toe but no clear injury.   Past Medical History:  Diagnosis Date  . AAA (abdominal aortic aneurysm) ([place]) 2007   stent graft repair 12/07  . Arteriosclerotic cardiovascular disease (ASCVD)    a. MI in 1999;  b.  PTCA of PDA and 2nd marginal in 3/00;  c. 02/2012 Cath: LM nl, LAD 32m, LCX 78m, OM small, 70, RCA 99p/127m-->Med Rx;  d. 03/2014 MV: EF 32%, inflat infarct w/ mild peri-infarct isch towards apex (similar to 2013 MV); d. 03/2014 Echo: EF 45-50%, Gr 1 DD, mildly dil LA.  Marland Kitchen CAD (coronary artery disease)   . Cerebrovascular disease   . CHF (congestive heart failure) ([place])   . Cholelithiasis 11/28/2010  . Cough, persistent   . Diverticulitis 2006  . DVT (deep venous thrombosis) ([place])   . Elevated PSA 06/2011   a. s/p TURP 01/2014.  Marland Kitchen Gastroesophageal reflux disease    Hiatal hernia; previously treated with PPI  . Hypertension    Mild; controlled with a single agent    . Laryngeal carcinoma ([place]) 2010   resection and radiation therapy 2010-11  . Myocardial infarction ([place])   . Obesity    BMI 47; lap band surgery 2010  . 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 Sleep apnea    BiPAP mask utilized        2 yrs sleep study    dr Redmond Pulling  . Tobacco abuse, in remission    40 pack years; discontinued in 2000  . Type 2 diabetes mellitus [facility])     Patient Active Problem List   Diagnosis Date Noted  . Sepsis due to cellulitis ([place]) 05/29/2018  . Type 2 diabetes mellitus ([place]) 05/29/2018  . Hypokalemia 05/29/2018  . Hypocalcemia 05/29/2018  . Unstable angina ([place])   . Hyperlipidemia 08/14/2017  . Biceps tendinopathy of right upper extremity 08/13/2017  . Nontraumatic complete tear of right rotator cuff 08/13/2017  . Lumbosacral spondylosis with radiculopathy 02/22/2016  . Pulmonary nodules/lesions, multiple 11/05/2013  . Prostate enlargement 11/05/2013  . Infection of urinary tract 11/05/2013  . Pyelonephritis 11/03/2013  . Sepsis-unclear source 11/03/2013  . History of laparoscopic adjustable gastric banding, APL, 12/19/2008. 05/12/2012  . Cold feet 04/01/2012  . Chest pain 04/01/2012  . Atherosclerosis of native arteries of the extremities with intermittent claudication 11/06/2011  . Popliteal aneurysm ([place]) 08/07/2011  . S/P bypass graft of extremity 08/07/2011  . Abdominal aneurysm without mention of rupture 07/17/2011  . Peripheral vascular disease, unspecified ([place]) 05/12/2011  . Pulmonary infiltrate 12/02/2010  . Cholelithiasis 11/28/2010  . Low back  pain 11/25/2010  . Pneumonia 11/25/2010  . CAD (coronary artery disease)   . Laryngeal carcinoma ([place])   . AAA (abdominal aortic aneurysm) ([place])   . Diverticulitis   . Obesity   . Gastroesophageal reflux disease   . Sleep apnea   . Tobacco abuse, in remission   . Cerebrovascular disease   . Essential hypertension 11/21/2009    Past Surgical History:  Procedure Laterality Date  . ANTERIOR LAT LUMBAR FUSION N/A 02/22/2016   Procedure: LUMBAR TWO-LUMBAR FIVE ANTERIOR LATERAL LUMBAR INTERBODY FUSION WITH PERCUTANEOUS PEDICLE SCREWS;  Surgeon: Kevan Ny  Ditty, MD;  Location: [place];  Service: Neurosurgery;  Laterality: N/A;  Left side approach  . CARDIAC CATHETERIZATION  2000   PCI-stent  . CATARACT EXTRACTION    . COLONOSCOPY  10/2007  . COLONOSCOPY WITH ESOPHAGOGASTRODUODENOSCOPY (EGD)  2009   RMR: He had distal esophageal erosions, patulous GE junction, erosive reflux esophagitis, hiatal hernia, lipoma on the angularis not manipulated, left-sided diverticula.  . CYSTOSCOPY  02/22/2016   Procedure: CYSTOSCOPY FLEXIBLE WITH URETHER DILATION  WITH CATHETER PLACEMENT;  Surgeon: Alexis Frock, MD;  Location: [place];  Service: Urology;;  . EYE SURGERY    . FEMORAL-FEMORAL BYPASS GRAFT  04/18/2011   Procedure: BYPASS GRAFT FEMORAL-FEMORAL ARTERY;  Surgeon: Theotis Burrow, MD;  Location: [facility] OR;  Service: Vascular;  Laterality: Bilateral;  Left to right femoral to femoral bypass   . FEMORAL-POPLITEAL BYPASS GRAFT  04/18/2011   Procedure: BYPASS GRAFT FEMORAL-POPLITEAL ARTERY;  Surgeon: Theotis Burrow, MD;  Location: [facility] OR;  Service: Vascular;  Laterality: Right;  Right femoral popliteal bypass with propaten gortex graft  . gastic sleeve  07/2015   gastic band removed and have sleeve  . LAPAROSCOPIC GASTRIC BANDING  2010  . LARYNX SURGERY  2010   Resection of carcinoma  . LEFT HEART CATH AND CORONARY ANGIOGRAPHY N/A 09/02/2017   Procedure: LEFT HEART CATH AND CORONARY ANGIOGRAPHY;  Surgeon: Burnell Blanks, MD;  Location: [place] CV LAB;  Service: Cardiovascular;  Laterality: N/A;  . LEFT HEART CATHETERIZATION WITH CORONARY ANGIOGRAM N/A 03/10/2012   Procedure: LEFT HEART CATHETERIZATION WITH CORONARY ANGIOGRAM;  Surgeon: Burnell Blanks, MD;  Location: [facility] CATH LAB;  Service: Cardiovascular;  Laterality: N/A;  . LUMBAR PERCUTANEOUS PEDICLE SCREW 3 LEVEL N/A 02/22/2016   Procedure: LUMBAR PERCUTANEOUS PEDICLE SCREW LUMBAR TWO-LUMBAR FIVE;  Surgeon: Kevan Ny Ditty, MD;  Location: [place];  Service: Neurosurgery;  Laterality: N/A;   . OPEN ANTERIOR SHOULDER RECONSTRUCTION  2011   Left  . PERIPHERAL ARTERIAL STENT GRAFT  2007   for abdominal aortic aneurysm  . PR VEIN BYPASS GRAFT,AORTO-FEM-POP  07-23-11   Left AK to BK popliteal BPG using rev. saphenous vein   Allergies Finasteride; Other; Statins; and Aspirin  Family History  Problem Relation Age of Onset  . Heart attack Father        deceased  . Heart failure Father   . Hyperlipidemia Father   . Hypertension Father   . Heart disease Father   . Lung cancer Mother        deceased   . Hypertension Mother   . Hyperlipidemia Mother   . Heart failure Mother   . Diabetes Mother   . Cancer Sister        vaginal  . Diabetes Sister   . Colon cancer Neg Hx     Social History Social History   Tobacco Use  . Smoking status: Former Smoker  Packs/day: 2.00    Years: 30.00    Pack years: 60.00    Types: Cigarettes    Last attempt to quit: 11/26/1997    Years since quitting: 20.5  . Smokeless tobacco: Never Used  . Tobacco comment: 40 pack years; quit 2000  Substance Use Topics  . Alcohol use: No    Alcohol/week: 0.0 standard drinks  . Drug use: No    Review of Systems  Constitutional: No fever/chills Eyes: No visual changes. ENT: No sore throat. Cardiovascular: Denies chest pain. Respiratory: Denies shortness of breath. Gastrointestinal: No abdominal pain.  No nausea, no vomiting.  No diarrhea.  No constipation. Positive left flank pain.  Genitourinary: Negative for dysuria. Musculoskeletal: Negative for back pain. Skin: Left leg pain, swelling, and redness.  Neurological: Negative for headaches, focal weakness or numbness.  10-point ROS otherwise negative.  ____________________________________________   PHYSICAL EXAM:  VITAL SIGNS: ED Triage Vitals  Enc Vitals Group     BP 05/29/18 2012 (!) 108/53     Pulse Rate 05/29/18 2012 87     Resp 05/29/18 2012 20     Temp 05/29/18 2012 98 F (36.7 C)     Temp Source 05/29/18 2012 Oral      SpO2 05/29/18 2012 100 %     Weight 05/29/18 2014 (!) 311 lb (141.1 kg)     Height 05/29/18 2014 5\' 11"  (1.803 m)     Pain Score 05/29/18 2013 6   Constitutional: Alert and oriented. Well appearing and in no acute distress. Eyes: Conjunctivae are normal. Head: Atraumatic. Nose: No congestion/rhinnorhea. Mouth/Throat: Mucous membranes are moist.  Neck: No stridor.  Cardiovascular: Normal rate, regular rhythm. Good peripheral circulation. Grossly normal heart sounds.   Respiratory: Normal respiratory effort.  No retractions. Lungs CTAB. Gastrointestinal: Soft and nontender. No distention.  Musculoskeletal: No lower extremity tenderness but left leg swelling and warmth. No gross deformities of extremities. Neurologic:  Normal speech and language. No gross focal neurologic deficits are appreciated.  Skin: Left leg is diffusely erythematous and warm to touch. No pustules. No induration or fluctuance. Erythema tracking up to the medial knee.   ____________________________________________   LABS (all labs ordered are listed, but only abnormal results are displayed)  Labs Reviewed  COMPREHENSIVE METABOLIC PANEL - Abnormal; Notable for the following components:      Result Value   Potassium 3.4 (*)    Glucose, Bld 171 (*)    Calcium 8.1 (*)    Total Bilirubin 0.2 (*)    All other components within normal limits  LACTIC ACID, PLASMA - Abnormal; Notable for the following components:   Lactic Acid, Venous 3.2 (*)    All other components within normal limits  URINALYSIS, ROUTINE W REFLEX MICROSCOPIC - Abnormal; Notable for the following components:   Color, Urine STRAW (*)    All other components within normal limits  CULTURE, BLOOD (ROUTINE X 2)  CULTURE, BLOOD (ROUTINE X 2)  URINE CULTURE  CBC WITH DIFFERENTIAL/PLATELET  LACTIC ACID, PLASMA   ____________________________________________  RADIOLOGY  Dg Foot Complete Left  Result Date: 05/29/2018 CLINICAL DATA:  Acute LEFT foot  pain and swelling for 2 days. EXAM: LEFT FOOT - COMPLETE 3+ VIEW COMPARISON:  None. FINDINGS: There is no evidence of fracture or dislocation. There is no evidence of arthropathy or other focal bone abnormality. Soft tissues are unremarkable. IMPRESSION: Negative. Electronically Signed   By: Margarette Canada M.D.   On: 05/29/2018 22:52   Ct Renal Stone Study  Result  Date: 05/29/2018 CLINICAL DATA:  68 year old male with acute LEFT abdominal and pelvic pain for several days. EXAM: CT ABDOMEN AND PELVIS WITHOUT CONTRAST TECHNIQUE: Multidetector CT imaging of the abdomen and pelvis was performed following the standard protocol without IV contrast. COMPARISON:  07/15/2016 and prior CTs FINDINGS: Please note that parenchymal abnormalities may be missed without intravenous contrast. Lower chest: Mild cardiomegaly and coronary artery calcifications again noted. No acute abnormality. Hepatobiliary: No hepatic abnormalities identified. Cholelithiasis noted without CT evidence of acute cholecystitis. No biliary dilatation. Pancreas: Unremarkable Spleen: No significant abnormality Adrenals/Urinary Tract: The kidneys and adrenal glands are unremarkable. No hydronephrosis or urinary calculi. Mild circumferential bladder wall thickening again noted. Stomach/Bowel: Gastric surgical changes again noted. No bowel obstruction, definite bowel wall thickening or inflammatory changes. Vascular/Lymphatic: Aorto bi-iliac stent graft again identified. No interval change. Aortic caliber is unchanged. No enlarged lymph nodes identified. Reproductive: Prostate enlargement and surgical changes again noted. Other: No ascites, focal collection or pneumoperitoneum. Musculoskeletal: No acute or suspicious abnormalities. Lumbar spine surgical changes and hardware again identified. IMPRESSION: 1. No evidence of acute abnormality. 2. Cholelithiasis without CT evidence of acute cholecystitis 3. Cardiomegaly, coronary disease and aorto bi-iliac stent  graft again noted. No changes identified. 4. Enlarged prostate and surgical changes again noted. Electronically Signed   By: Margarette Canada M.D.   On: 05/29/2018 22:51    ____________________________________________   PROCEDURES  Procedure(s) performed:   Procedures  CRITICAL CARE Performed by: Margette Fast Total critical care time: 35 minutes Critical care time was exclusive of separately billable procedures and treating other patients. Critical care was necessary to treat or prevent imminent or life-threatening deterioration. Critical care was time spent personally by me on the following activities: development of treatment plan with patient and/or surrogate as well as nursing, discussions with consultants, evaluation of patient's response to treatment, examination of patient, obtaining history from patient or surrogate, ordering and performing treatments and interventions, ordering and review of laboratory studies, ordering and review of radiographic studies, pulse oximetry and re-evaluation of patient's condition.  Nanda Quinton, MD Emergency Medicine  ____________________________________________   INITIAL IMPRESSION / ASSESSMENT AND PLAN / ED COURSE  Pertinent labs & imaging results that were available during my care of the patient were reviewed by me and considered in my medical decision making (see chart for details).  Patient presents to the emergency department with left leg swelling and erythema.  He is having some associated left flank pain which appears to be a separate complaint.  I am able to Doppler both DP and PT pulses in the bilateral lower extremities.  There is some skin breakdown on the bottom of the left toe but no deep ulceration.  Patient does have history of DVT but this seems more consistent with cellulitis.  Plan for screening labs, x-ray of the left foot, CT renal for flank pain evaluation, and reassess.  I did draw blood cultures.   Patient with lactate  elevated greater than 3.  CBC had to be recollected and is pending.  X-ray of the foot with no acute findings.  CT renal reviewed with no acute findings.  No findings on exam to suspect complication from AAA repair.  Patient has tenderness in the paraspinal muscles of the lumbar spine on the left.  Suspect MSK etiology for the patient's flank pain.  Pulses intact as outlined above.  Plan to cover for sepsis with Rocephin and vancomycin.  Continue IV fluids and admit for further monitoring and treatment.  Discussed patient's case with Hospitalist, Dr. Olevia Bowens to request admission. Patient and family (if present) updated with plan. Care transferred to [facility] service.  I reviewed all nursing notes, vitals, pertinent old records, EKGs, labs, imaging (as available).  ____________________________________________  FINAL CLINICAL IMPRESSION(S) / ED DIAGNOSES  Final diagnoses:  Cellulitis of left lower extremity  Left flank pain     MEDICATIONS GIVEN DURING THIS VISIT:  Medications  vancomycin (VANCOCIN) IVPB 1000 mg/200 mL premix (1,000 mg Intravenous New Bag/Given 05/29/18 2308)  cefTRIAXone (ROCEPHIN) 2 g in sodium chloride 0.9 % 100 mL IVPB (2 g Intravenous New Bag/Given 05/29/18 2349)  vancomycin (VANCOCIN) IVPB 1000 mg/200 mL premix (has no administration in time range)  0.9 %  sodium chloride infusion (250 mLs Intravenous New Bag/Given 05/29/18 2348)  sodium chloride flush (NS) 0.9 % injection 3 mL (3 mLs Intravenous Given 05/29/18 2214)    Note:  This document was prepared using Dragon voice recognition software and may include unintentional dictation errors.  Nanda Quinton, MD Emergency Medicine    Ona Rathert, [facility], MD 05/29/18 2352

## 2018-05-30 LAB — BASIC METABOLIC PANEL
ANION GAP: 10 (ref 5–15)
BUN: 17 mg/dL (ref 8–23)
CALCIUM: 7.8 mg/dL — AB (ref 8.9–10.3)
CO2: 23 mmol/L (ref 22–32)
Chloride: 101 mmol/L (ref 98–111)
Creatinine, Ser: 0.85 mg/dL (ref 0.61–1.24)
GFR calc Af Amer: >60 mL/min (ref >60)
GFR calc non Af Amer: >60 mL/min (ref >60)
Glucose, Bld: 299 mg/dL — ABNORMAL HIGH (ref 70–99)
Potassium: 3.6 mmol/L (ref 3.5–5.1)
Sodium: 134 mmol/L — ABNORMAL LOW (ref 135–145)

## 2018-05-30 LAB — CBC WITH DIFFERENTIAL/PLATELET
Abs Immature Granulocytes: 0.02 10*3/uL (ref 0.00–0.07)
Basophils Absolute: 0 10*3/uL (ref 0.0–0.1)
Basophils Relative: 0 %
EOS ABS: 0.3 10*3/uL (ref 0.0–0.5)
Eosinophils Relative: 6 %
HEMATOCRIT: 40 % (ref 39.0–52.0)
Hemoglobin: 12.8 g/dL — ABNORMAL LOW (ref 13.0–17.0)
Immature Granulocytes: 0 %
Lymphocytes Relative: 28 %
Lymphs Abs: 1.5 10*3/uL (ref 0.7–4.0)
MCH: 30.9 pg (ref 26.0–34.0)
MCHC: 32 g/dL (ref 30.0–36.0)
MCV: 96.6 fL (ref 80.0–100.0)
Monocytes Absolute: 0.5 10*3/uL (ref 0.1–1.0)
Monocytes Relative: 10 %
Neutro Abs: 2.8 10*3/uL (ref 1.7–7.7)
Neutrophils Relative %: 56 %
Platelets: 123 10*3/uL — ABNORMAL LOW (ref 150–400)
RBC: 4.14 MIL/uL — ABNORMAL LOW (ref 4.22–5.81)
RDW: 12.9 % (ref 11.5–15.5)
WBC: 5.2 10*3/uL (ref 4.0–10.5)
nRBC: 0 % (ref 0.0–0.2)

## 2018-05-30 LAB — PHOSPHORUS: Phosphorus: 3.5 mg/dL (ref 2.5–4.6)

## 2018-05-30 LAB — MAGNESIUM: MAGNESIUM: 1.8 mg/dL (ref 1.7–2.4)

## 2018-05-30 LAB — GLUCOSE, CAPILLARY
Glucose-Capillary: 195 mg/dL — ABNORMAL HIGH (ref 70–99)
Glucose-Capillary: 175 mg/dL — ABNORMAL HIGH (ref 70–99)
Glucose-Capillary: 170 mg/dL — ABNORMAL HIGH (ref 70–99)
Glucose-Capillary: 120 mg/dL — ABNORMAL HIGH (ref 70–99)

## 2018-05-30 LAB — LACTIC ACID, PLASMA
LACTIC ACID, VENOUS: 2.5 mmol/L — AB (ref 0.5–1.9)
Lactic Acid, Venous: 3.1 mmol/L (ref 0.5–1.9)
Lactic Acid, Venous: 1.9 mmol/L (ref 0.5–1.9)

## 2018-05-30 LAB — MRSA PCR SCREENING: MRSA by PCR: NEGATIVE

## 2018-05-30 MED ORDER — POTASSIUM CHLORIDE CRYS ER 20 MEQ PO TBCR
40.0000 meq | EXTENDED_RELEASE_TABLET | Freq: Once | ORAL | Status: AC
  Administered 2018-05-30: 40 meq via ORAL
  Filled 2018-05-30: qty 2

## 2018-05-30 MED ORDER — NITROGLYCERIN 0.4 MG SL SUBL: 0.4000 mg | SUBLINGUAL_TABLET | SUBLINGUAL | Status: DC | PRN

## 2018-05-30 MED ORDER — ASPIRIN EC 81 MG PO TBEC
81.0000 mg | DELAYED_RELEASE_TABLET | ORAL | Status: DC
  Administered 2018-05-30: 81 mg via ORAL
  Filled 2018-05-30 (×2): qty 1

## 2018-05-30 MED ORDER — ACETAMINOPHEN 650 MG RE SUPP: 650.0000 mg | Freq: Four times a day (QID) | RECTAL | Status: DC | PRN

## 2018-05-30 MED ORDER — IRBESARTAN 300 MG PO TABS
300.0000 mg | ORAL_TABLET | Freq: Every day | ORAL | Status: DC
  Administered 2018-05-30 – 2018-05-31 (×2): 300 mg via ORAL
  Filled 2018-05-30 (×2): qty 1

## 2018-05-30 MED ORDER — CARVEDILOL 3.125 MG PO TABS
6.2500 mg | ORAL_TABLET | Freq: Two times a day (BID) | ORAL | Status: DC
  Administered 2018-05-30 – 2018-05-31 (×3): 6.25 mg via ORAL
  Filled 2018-05-30 (×3): qty 2

## 2018-05-30 MED ORDER — HYDROMORPHONE HCL 1 MG/ML IJ SOLN
1.0000 mg | INTRAMUSCULAR | Status: DC | PRN
  Administered 2018-05-30 (×3): 1 mg via INTRAVENOUS
  Filled 2018-05-30 (×3): qty 1

## 2018-05-30 MED ORDER — POTASSIUM CHLORIDE IN NACL 20-0.9 MEQ/L-% IV SOLN
INTRAVENOUS | Status: DC
  Administered 2018-05-30: 02:00:00 via INTRAVENOUS

## 2018-05-30 MED ORDER — ACETAMINOPHEN 325 MG PO TABS
650.0000 mg | ORAL_TABLET | Freq: Four times a day (QID) | ORAL | Status: DC | PRN
  Administered 2018-05-30: 650 mg via ORAL
  Filled 2018-05-30: qty 2

## 2018-05-30 MED ORDER — VITAMIN D 25 MCG (1000 UNIT) PO TABS
2000.0000 [IU] | ORAL_TABLET | Freq: Every day | ORAL | Status: DC
  Administered 2018-05-30 – 2018-05-31 (×2): 2000 [IU] via ORAL
  Filled 2018-05-30 (×2): qty 2

## 2018-05-30 MED ORDER — INSULIN ASPART 100 UNIT/ML ~~LOC~~ SOLN
0.0000 [IU] | Freq: Three times a day (TID) | SUBCUTANEOUS | Status: DC
  Administered 2018-05-30 (×3): 4 [IU] via SUBCUTANEOUS

## 2018-05-30 MED ORDER — EZETIMIBE 10 MG PO TABS
10.0000 mg | ORAL_TABLET | Freq: Every day | ORAL | Status: DC
  Administered 2018-05-30 – 2018-05-31 (×2): 10 mg via ORAL
  Filled 2018-05-30 (×2): qty 1

## 2018-05-30 MED ORDER — PANTOPRAZOLE SODIUM 40 MG PO TBEC
40.0000 mg | DELAYED_RELEASE_TABLET | ORAL | Status: DC
  Administered 2018-05-30: 40 mg via ORAL
  Filled 2018-05-30 (×2): qty 1

## 2018-05-30 MED ORDER — VANCOMYCIN HCL IN DEXTROSE 1-5 GM/200ML-% IV SOLN
1000.0000 mg | Freq: Two times a day (BID) | INTRAVENOUS | Status: DC
  Administered 2018-05-30 – 2018-05-31 (×2): 1000 mg via INTRAVENOUS
  Filled 2018-05-30 (×2): qty 200

## 2018-05-30 MED ORDER — CLOTRIMAZOLE 1 % EX CREA
TOPICAL_CREAM | Freq: Two times a day (BID) | CUTANEOUS | Status: DC
  Administered 2018-05-30: 23:00:00 via TOPICAL
  Filled 2018-05-30 (×2): qty 15

## 2018-05-30 MED ORDER — ONDANSETRON HCL 4 MG/2ML IJ SOLN
4.0000 mg | Freq: Once | INTRAMUSCULAR | Status: AC
  Administered 2018-05-30: 4 mg via INTRAVENOUS
  Filled 2018-05-30: qty 2

## 2018-05-30 MED ORDER — GABAPENTIN 400 MG PO CAPS
400.0000 mg | ORAL_CAPSULE | Freq: Every day | ORAL | Status: DC
  Administered 2018-05-30: 400 mg via ORAL
  Filled 2018-05-30: qty 1

## 2018-05-30 MED ORDER — METFORMIN HCL 500 MG PO TABS
500.0000 mg | ORAL_TABLET | Freq: Every day | ORAL | Status: DC
  Administered 2018-05-30 – 2018-05-31 (×2): 500 mg via ORAL
  Filled 2018-05-30 (×2): qty 1

## 2018-05-30 MED ORDER — DOCUSATE SODIUM 100 MG PO CAPS
100.0000 mg | ORAL_CAPSULE | Freq: Every day | ORAL | Status: DC | PRN
  Administered 2018-05-30: 200 mg via ORAL
  Filled 2018-05-30: qty 2

## 2018-05-30 MED ORDER — ADULT MULTIVITAMIN W/MINERALS CH
1.0000 | ORAL_TABLET | Freq: Every day | ORAL | Status: DC
  Administered 2018-05-30 – 2018-05-31 (×2): 1 via ORAL
  Filled 2018-05-30 (×2): qty 1

## 2018-05-30 MED ORDER — LACTATED RINGERS IV BOLUS (SEPSIS)
3500.0000 mL | Freq: Once | INTRAVENOUS | Status: AC
  Administered 2018-05-30: 3500 mL via INTRAVENOUS

## 2018-05-30 MED ORDER — ENOXAPARIN SODIUM 80 MG/0.8ML ~~LOC~~ SOLN
0.5000 mg/kg | SUBCUTANEOUS | Status: DC
  Administered 2018-05-30 (×2): 70 mg via SUBCUTANEOUS
  Filled 2018-05-30 (×2): qty 0.8

## 2018-05-30 NOTE — Progress Notes (Signed)
PROGRESS NOTE    Mathew Padilla  QBH:[id number] DOB: 01-16-1951 DOA: 05/29/2018 PCP: Doree Albee, MD   Brief Narrative:  Per HPI: Mathew Padilla is a 68 y.o. 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  Not sure about fever, but positive chills, and feels fatigued.  No rhinorrhea, wheezing, hemoptysis, chest pain, palpitations, diaphoresis, PND, orthopnea, but stated he occasionally gets pitting edema of the lower extremities.  Denies abdominal pain, nausea or emesis, diarrhea, constipation, melena or hematochezia.  No dysuria, frequency or hematuria.  No polyuria, polydipsia, polyphagia or blurred vision.  Patient was admitted with sepsis secondary to cellulitis on the left lower extremity.  He has been started on vancomycin and Rocephin with improvement noted this morning.  Assessment & Plan:   Principal Problem:   Sepsis due to cellulitis [facility] Active Problems:   Essential hypertension   CAD (coronary artery disease)   Gastroesophageal reflux disease   Cholelithiasis   Hyperlipidemia   Type 2 diabetes mellitus (HCC)   Hypokalemia   Hypocalcemia   1. Sepsis secondary left lower extremity cellulitis.  Continue on Rocephin and vancomycin.  Patient appears to have adequate circulation to the lower extremities given history of peripheral arterial disease and prior revascularization procedures.  Continue to  monitor repeat lactic acid in a.m. and check MRSA PCR. 2. Essential hypertension.  Continue on home medications of Avapro and carvedilol.  Agree with holding torsemide for now and recheck labs in a.m. and follow blood pressures. 3. CAD.  Maintain on aspirin, beta-blocker, and statin. 4. GERD.  PPI daily. 5. Hyperlipidemia. Continue on Zetia daily. 6. DM2. Noted hyperglycemia; maintain on carb modified diet and restart home metformin.  Maintain on SSI and monitor carefully. 7. Chronic cholelithiasis-asymptomatic. 8. Hypokalemia-repleted.  Recheck in a.m. 9. Hypocalcemia.  Vitamin D level pending. 10. Thrombocytopenia-stable.  Recheck in a.m.   DVT prophylaxis: Heparin Code Status: Full code Family Communication: None at bedside Disposition Plan: Continue on IV antibiotics and taper to oral hopefully by a.m. with possible discharge of cellulitis appears to be improving   Consultants:   None  Procedures:   None  Antimicrobials:   IV Rocephin and vancomycin 2/1->   Subjective: Patient seen and evaluated today with no new acute complaints or concerns. No acute concerns or events noted overnight.  He denies any further lower extremity pain and is having breakfast this morning.  He still has some warmth and erythema that is persistent.  Objective: Vitals:   05/29/18 2014 05/29/18 2350 05/30/18 0029 05/30/18 0559  BP:  124/72 101/62 135/71  Pulse:  70 70 64  Resp:  18 18 18   Temp:  98.3 F (36.8 C) 98.7 F (37.1 C) 97.6 F (36.4 C)  TempSrc:  Oral Oral Oral  SpO2:  96% 97% 95%  Weight: (!) 141.1 kg     Height: 5\' 11"  (1.803 m)       Intake/Output Summary (Last 24 hours) at 05/30/2018 0933 Last data filed at 05/30/2018 0300 Gross per 24  hour  Intake 2567.53 ml  Output -  Net 2567.53 ml   Filed Weights   05/29/18 2014  Weight: (!) 141.1 kg    Examination:  General exam: Appears calm and comfortable, obese Respiratory system: Clear to auscultation. Respiratory effort  normal. Cardiovascular system: S1 & S2 heard, RRR. No JVD, murmurs, rubs, gallops or clicks. No pedal edema. Gastrointestinal system: Abdomen is nondistended, soft and nontender. No organomegaly or masses felt. Normal bowel sounds heard. Central nervous system: Alert and oriented. No focal neurological deficits. Extremities: Symmetric 5 x 5 power. Skin: No rashes, lesions or ulcers, left lower extremity with improvement in erythema and warmth noted.  No significant tenderness to palpation. Psychiatry: Judgement and insight appear normal. Mood & affect appropriate.     Data Reviewed: I have personally reviewed following labs and imaging studies  CBC: Recent Labs  Lab 05/30/18 0407  WBC 5.2  NEUTROABS 2.8  HGB 12.8*  HCT 40.0  MCV 96.6  PLT 270*   Basic Metabolic Panel: Recent Labs  Lab 05/29/18 2145 05/30/18 0407  NA 135 134*  K 3.4* 3.6  CL 101 101  CO2 24 23  GLUCOSE 171* 299*  BUN 20 17  CREATININE 0.97 0.85  CALCIUM 8.1* 7.8*  MG  --  1.8  PHOS  --  3.5   GFR: Estimated Creatinine Clearance: 119.5 mL/min (by C-G formula based on SCr of 0.85 mg/dL). Liver Function Tests: Recent Labs  Lab 05/29/18 2145  AST 27  ALT 29  ALKPHOS 65  BILITOT 0.2*  PROT 6.9  ALBUMIN 3.8   No results for input(s): LIPASE, AMYLASE in the last 168 hours. No results for input(s): AMMONIA in the last 168 hours. Coagulation Profile: No results for input(s): INR, PROTIME in the last 168 hours. Cardiac Enzymes: No results for input(s): CKTOTAL, CKMB, CKMBINDEX, TROPONINI in the last 168 hours. BNP (last 3 results) No results for input(s): PROBNP in the last 8760 hours. HbA1C: No results for input(s): HGBA1C in the last 72 hours. CBG: Recent Labs  Lab 05/30/18 0748  GLUCAP 195*   Lipid Profile: No results for input(s): CHOL, HDL, LDLCALC, TRIG, CHOLHDL, LDLDIRECT in the last 72 hours. Thyroid Function Tests: No results for input(s): TSH, T4TOTAL, FREET4, T3FREE, THYROIDAB in  the last 72 hours. Anemia Panel: No results for input(s): VITAMINB12, FOLATE, FERRITIN, TIBC, IRON, RETICCTPCT in the last 72 hours. Sepsis Labs: Recent Labs  Lab 05/29/18 2145 05/29/18 2336 05/30/18 0407 05/30/18 0757  LATICACIDVEN 3.2* 2.5* 3.1* 1.9    Recent Results (from the past 240 hour(s))  Culture, blood (routine x 2)     Status: None (Preliminary result)   Collection Time: 05/29/18  9:45 PM  Result Value Ref Range Status   Specimen Description RIGHT ANTECUBITAL  Final   Special Requests   Final    BOTTLES DRAWN AEROBIC AND ANAEROBIC Blood Culture adequate volume   Culture   Final    NO GROWTH < 12 HOURS Performed at [facility], [address] [place], [place] [address]    Report Status PENDING  Incomplete  Culture, blood (routine x 2)     Status: None (Preliminary result)   Collection Time: 05/29/18  9:49 PM  Result Value Ref Range Status   Specimen Description BLOOD LEFT ARM  Final   Special Requests   Final    BOTTLES DRAWN AEROBIC AND ANAEROBIC Blood Culture adequate volume   Culture   Final    NO GROWTH < 12 HOURS Performed at  [facility], [address], [place], [place] [address]    Report Status PENDING  Incomplete         Radiology Studies: Dg Foot Complete Left  Result Date: 05/29/2018 CLINICAL DATA:  Acute LEFT foot pain and swelling for 2 days. EXAM: LEFT FOOT - COMPLETE 3+ VIEW COMPARISON:  None. FINDINGS: There is no evidence of fracture or dislocation. There is no evidence of arthropathy or other focal bone abnormality. Soft tissues are unremarkable. IMPRESSION: Negative. Electronically Signed   By: Margarette Canada M.D.   On: 05/29/2018 22:52   Ct Renal Stone Study  Result Date: 05/29/2018 CLINICAL DATA:  68 year old male with acute LEFT abdominal and pelvic pain for several days. EXAM: CT ABDOMEN AND PELVIS WITHOUT CONTRAST TECHNIQUE: Multidetector CT imaging of the abdomen and pelvis was performed following the standard protocol without IV  contrast. COMPARISON:  07/15/2016 and prior CTs FINDINGS: Please note that parenchymal abnormalities may be missed without intravenous contrast. Lower chest: Mild cardiomegaly and coronary artery calcifications again noted. No acute abnormality. Hepatobiliary: No hepatic abnormalities identified. Cholelithiasis noted without CT evidence of acute cholecystitis. No biliary dilatation. Pancreas: Unremarkable Spleen: No significant abnormality Adrenals/Urinary Tract: The kidneys and adrenal glands are unremarkable. No hydronephrosis or urinary calculi. Mild circumferential bladder wall thickening again noted. Stomach/Bowel: Gastric surgical changes again noted. No bowel obstruction, definite bowel wall thickening or inflammatory changes. Vascular/Lymphatic: Aorto bi-iliac stent graft again identified. No interval change. Aortic caliber is unchanged. No enlarged lymph nodes identified. Reproductive: Prostate enlargement and surgical changes again noted. Other: No ascites, focal collection or pneumoperitoneum. Musculoskeletal: No acute or suspicious abnormalities. Lumbar spine surgical changes and hardware again identified. IMPRESSION: 1. No evidence of acute abnormality. 2. Cholelithiasis without CT evidence of acute cholecystitis 3. Cardiomegaly, coronary disease and aorto bi-iliac stent graft again noted. No changes identified. 4. Enlarged prostate and surgical changes again noted. Electronically Signed   By: Margarette Canada M.D.   On: 05/29/2018 22:51        Scheduled Meds: . aspirin EC  81 mg Oral QODAY  . carvedilol  6.25 mg Oral BID  . cholecalciferol  2,000 Units Oral Daily  . clotrimazole   Topical BID  . enoxaparin (LOVENOX) injection  0.5 mg/kg Subcutaneous Q24H  . ezetimibe  10 mg Oral Daily  . gabapentin  400 mg Oral QHS  . insulin aspart  0-20 Units Subcutaneous TID WC  . irbesartan  300 mg Oral Daily  . multivitamin with minerals  1 tablet Oral Daily  . pantoprazole  40 mg Oral QODAY    Continuous Infusions: . sodium chloride 250 mL (05/29/18 2348)  . cefTRIAXone (ROCEPHIN)  IV 2 g (05/29/18 2349)     LOS: 1 day    Time spent: 30 minutes    Nannie Starzyk Darleen Crocker, DO [facility] Pager [phone]  If 7PM-7AM, please contact night-coverage [email or web address] Password [facility] 05/30/2018, 9:33 AM

## 2018-05-30 NOTE — Progress Notes (Signed)
Pharmacy Antibiotic Note  Mathew Padilla is a 68 y.o. male admitted on 05/29/2018 with cellulitis.  Pharmacy has been consulted for vancomycin dosing.  Plan: Loading dose:  vancomycin 2g (1g + 1g) x1 dose Maintenance dose:  vancomycin 1g IV q12h Goal AUC range: ~400 Pharmacy will continue to monitor renal function, vancomycin peaks/ troughs per AUC protocol, cultures and patient progress.   Height: 5\' 11"  (180.3 cm) Weight: (!) 311 lb (141.1 kg) IBW/kg (Calculated) : 75.3  Temp (24hrs), Avg:98.2 F (36.8 C), Min:97.6 F (36.4 C), Max:98.7 F (37.1 C)  Recent Labs  Lab 05/29/18 2145 05/29/18 2336 05/30/18 0407 05/30/18 0757  WBC  --   --  5.2  --   CREATININE 0.97  --  0.85  --   LATICACIDVEN 3.2* 2.5* 3.1* 1.9    Estimated Creatinine Clearance: 119.5 mL/min (by C-G formula based on SCr of 0.85 mg/dL).    Allergies  Allergen Reactions  . Finasteride Other (See Comments)    Unable to void Unable to void Unable to void  . Other Rash    Beta-blockers Beta-blockers Beta-blockers  . Statins Hives and Rash  . Aspirin Other (See Comments)    Cannot take daily, just once in a while Cannot take daily, just once in a while Other reaction(s): GI Upset (intolerance) Cannot take daily, just once in a while Daily dose upsets stomach    Antimicrobials this admission: ceftriaxone 2/1 >>   vancomycin 2/1 >>    Microbiology results: 2/1 BC x2: NG <12h 2/1 UCx: sent    Thank you for allowing pharmacy to be a part of this patient's care.  Despina Pole 05/30/2018 10:39 AM

## 2018-05-30 NOTE — Progress Notes (Signed)
ANTIBIOTIC CONSULT NOTE-Preliminary  Pharmacy Consult for vancomycin Indication: cellulitis  Allergies  Allergen Reactions  . Finasteride Other (See Comments)    Unable to void Unable to void Unable to void  . Other Rash    Beta-blockers Beta-blockers Beta-blockers  . Statins Hives and Rash  . Aspirin Other (See Comments)    Cannot take daily, just once in a while Cannot take daily, just once in a while Other reaction(s): GI Upset (intolerance) Cannot take daily, just once in a while Daily dose upsets stomach    Patient Measurements: Height: 5\' 11"  (180.3 cm) Weight: (!) 311 lb (141.1 kg) IBW/kg (Calculated) : 75.3 Adjusted Body Weight:   Vital Signs: Temp: 97.6 F (36.4 C) (02/02 0559) Temp Source: Oral (02/02 0559) BP: 135/71 (02/02 0559) Pulse Rate: 64 (02/02 0559)  Labs: Recent Labs    05/29/18 2145 05/30/18 0407  WBC  --  5.2  HGB  --  12.8*  PLT  --  123*  CREATININE 0.97 0.85    Estimated Creatinine Clearance: 119.5 mL/min (by C-G formula based on SCr of 0.85 mg/dL).  No results for input(s): VANCOTROUGH, VANCOPEAK, VANCORANDOM, GENTTROUGH, GENTPEAK, GENTRANDOM, TOBRATROUGH, TOBRAPEAK, TOBRARND, AMIKACINPEAK, AMIKACINTROU, AMIKACIN in the last 72 hours.   Microbiology: Recent Results (from the past 720 hour(s))  Culture, blood (routine x 2)     Status: None (Preliminary result)   Collection Time: 05/29/18  9:45 PM  Result Value Ref Range Status   Specimen Description RIGHT ANTECUBITAL  Final   Special Requests   Final    BOTTLES DRAWN AEROBIC AND ANAEROBIC Blood Culture adequate volume   Culture   Final    NO GROWTH < 12 HOURS Performed at [facility], [address], [place], [place] [address]    Report Status PENDING  Incomplete  Culture, blood (routine x 2)     Status: None (Preliminary result)   Collection Time: 05/29/18  9:49 PM  Result Value Ref Range Status   Specimen Description BLOOD LEFT ARM  Final   Special Requests   Final   BOTTLES DRAWN AEROBIC AND ANAEROBIC Blood Culture adequate volume   Culture   Final    NO GROWTH < 12 HOURS Performed at [facility], [address], [place], [place] [address]    Report Status PENDING  Incomplete    Medical History: Past Medical History:  Diagnosis Date  . AAA (abdominal aortic aneurysm) ([place]) 2007   stent graft repair 12/07  . Arteriosclerotic cardiovascular disease (ASCVD)    a. MI in 1999;  b.  PTCA of PDA and 2nd marginal in 3/00;  c. 02/2012 Cath: LM nl, LAD 55m, LCX 69m, OM small, 70, RCA 99p/111m-->Med Rx;  d. 03/2014 MV: EF 32%, inflat infarct w/ mild peri-infarct isch towards apex (similar to 2013 MV); d. 03/2014 Echo: EF 45-50%, Gr 1 DD, mildly dil LA.  Marland Kitchen CAD (coronary artery disease)   . Cerebrovascular disease   . CHF (congestive heart failure) ([place])   . Cholelithiasis 11/28/2010  . Cough, persistent   . Diverticulitis 2006  . DVT (deep venous thrombosis) ([place])   . Elevated PSA 06/2011   a. s/p TURP 01/2014.  Marland Kitchen Gastroesophageal reflux disease    Hiatal hernia; previously treated with PPI  . Hypertension    Mild; controlled with a single agent    . Laryngeal carcinoma ([place]) 2010   resection and radiation therapy 2010-11  . Myocardial infarction ([place])   . Obesity    BMI 47; lap band  surgery 2010  . Popliteal aneurysm ([place])    popliteal bypass-2013  . Sleep apnea    BiPAP mask utilized        2 yrs sleep study    dr Redmond Pulling  . Tobacco abuse, in remission    40 pack years; discontinued in 2000  . Type 2 diabetes mellitus (HCC)     Medications:  Infusions:  . sodium chloride 250 mL (05/29/18 2348)  . 0.9 % NaCl with KCl 20 mEq / L 100 mL/hr at 05/30/18 0135  . cefTRIAXone (ROCEPHIN)  IV 2 g (05/29/18 2349)   Anti-infectives (From admission, onward)   Start     Dose/Rate Route Frequency Ordered Stop   05/30/18 0000  vancomycin (VANCOCIN) IVPB 1000 mg/200 mL premix     1,000 mg 200 mL/hr over 60 Minutes Intravenous  Once 05/29/18 2320 05/30/18  0113   05/29/18 2300  vancomycin (VANCOCIN) IVPB 1000 mg/200 mL premix     1,000 mg 200 mL/hr over 60 Minutes Intravenous  Once 05/29/18 2245 05/30/18 0012   05/29/18 2300  cefTRIAXone (ROCEPHIN) 2 g in sodium chloride 0.9 % 100 mL IVPB     2 g 200 mL/hr over 30 Minutes Intravenous Every 24 hours 05/29/18 2245        Assessment: Asked to dose vancomycin for this 68 yo male with sepsis due to cellulitis.    Goal of Therapy:  Vancomycin trough level 10-15 mcg/ml  Plan:  Preliminary review of pertinent patient information completed.  Protocol will be initiated with dose(s) of vancomycin 1 gram after the initial 1 gram is given in ED (as ordered by MD and autoverified) for total loading dose of 2 grams.  Forestine Na clinical pharmacist will complete review during morning rounds to assess patient and finalize treatment regimen if needed.  Vici Novick Scarlett, RPH 05/30/2018,7:53 AM

## 2018-05-31 LAB — URINE CULTURE
Culture: NO GROWTH
Special Requests: NORMAL

## 2018-05-31 LAB — CBC WITH DIFFERENTIAL/PLATELET
Abs Immature Granulocytes: 0.02 10*3/uL (ref 0.00–0.07)
Basophils Absolute: 0.1 10*3/uL (ref 0.0–0.1)
Basophils Relative: 1 %
Eosinophils Absolute: 0.4 10*3/uL (ref 0.0–0.5)
Eosinophils Relative: 6 %
HCT: 40.8 % (ref 39.0–52.0)
Hemoglobin: 12.9 g/dL — ABNORMAL LOW (ref 13.0–17.0)
Immature Granulocytes: 0 %
Lymphocytes Relative: 23 %
Lymphs Abs: 1.3 10*3/uL (ref 0.7–4.0)
MCH: 30.9 pg (ref 26.0–34.0)
MCHC: 31.6 g/dL (ref 30.0–36.0)
MCV: 97.6 fL (ref 80.0–100.0)
Monocytes Absolute: 0.6 10*3/uL (ref 0.1–1.0)
Monocytes Relative: 10 %
Neutro Abs: 3.4 10*3/uL (ref 1.7–7.7)
Neutrophils Relative %: 60 %
Platelets: 127 10*3/uL — ABNORMAL LOW (ref 150–400)
RBC: 4.18 MIL/uL — AB (ref 4.22–5.81)
RDW: 12.9 % (ref 11.5–15.5)
WBC: 5.7 10*3/uL (ref 4.0–10.5)
nRBC: 0 % (ref 0.0–0.2)

## 2018-05-31 LAB — VITAMIN D 25 HYDROXY (VIT D DEFICIENCY, FRACTURES): Vit D, 25-Hydroxy: 17.3 ng/mL — ABNORMAL LOW (ref 30.0–100.0)

## 2018-05-31 LAB — BASIC METABOLIC PANEL
Anion gap: 8 (ref 5–15)
BUN: 12 mg/dL (ref 8–23)
CO2: 26 mmol/L (ref 22–32)
Calcium: 8.4 mg/dL — ABNORMAL LOW (ref 8.9–10.3)
Chloride: 104 mmol/L (ref 98–111)
Creatinine, Ser: 0.9 mg/dL (ref 0.61–1.24)
GFR calc Af Amer: >60 mL/min (ref >60)
GFR calc non Af Amer: >60 mL/min (ref >60)
Glucose, Bld: 172 mg/dL — ABNORMAL HIGH (ref 70–99)
Potassium: 4.3 mmol/L (ref 3.5–5.1)
SODIUM: 138 mmol/L (ref 135–145)

## 2018-05-31 LAB — GLUCOSE, CAPILLARY: Glucose-Capillary: 147 mg/dL — ABNORMAL HIGH (ref 70–99)

## 2018-05-31 MED ORDER — CLOTRIMAZOLE 1 % EX CREA: TOPICAL_CREAM | Freq: Two times a day (BID) | CUTANEOUS | 0 refills | Status: AC

## 2018-05-31 MED ORDER — CEPHALEXIN 500 MG PO CAPS: 500.0000 mg | ORAL_CAPSULE | Freq: Four times a day (QID) | ORAL | 0 refills | Status: AC

## 2018-05-31 NOTE — Progress Notes (Signed)
Patient's IV catheter removed and intact. Pt's IV site clean dry and intact. Discharge instructions including medications and follow up appointments were reviewed and discussed with patient. All questions were answered and no further questions at this time.. Pt in stable condition and in no acute distress at time of discharge. Pt will be escorted by nurse tech.  

## 2018-05-31 NOTE — Discharge Summary (Signed)
Physician Discharge Summary  Mathew Padilla WCB:[id number] DOB: [birth year] DOA: 05/29/2018  PCP: Doree Albee, MD  Admit date: 05/29/2018  Discharge date: 05/31/2018  Admitted From:Home  Disposition:  Home  Recommendations for Outpatient Follow-up:  1. Follow up with PCP in 1-2 weeks 2. Remain on Keflex for treatment of cellulitis for 10 days  Home Health: None  Equipment/Devices: None  Discharge Condition: Stable  CODE STATUS: Full  Diet recommendation: Heart Healthy/carb modified  Brief/Interim Summary: Per HPI: Mathew Padilla a 67 y.o.malewith medical history significant ofAAA with stent graft repair, history of CAD, history of MI, history of PAD/bilateral femoropopliteal bypass, chronic systolic CHF, cholelithiasis, diverticulosis, history of possible diverticulitis, DVT, elevated PSA with history of TURP, GERD/hiatal hernia, hypertension, laryngeal carcinoma with history of surgery and radiation therapy, morbid obesity, popliteal aneurysm, sleep apnea, 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 Not sure about fever, but positive chills, and feels fatigued. No rhinorrhea, wheezing, hemoptysis, chest pain, palpitations, diaphoresis, PND, orthopnea, but stated he occasionally gets pitting edema of the lower extremities. Denies abdominal pain, nausea or emesis, diarrhea, constipation, melena or hematochezia. No dysuria, frequency or hematuria. No polyuria, polydipsia, polyphagia or blurred vision.  Patient was admitted with sepsis secondary to cellulitis on the left lower extremity.  He has been started on vancomycin and Rocephin with improvement noted the following day.  The erythema appears to have almost cleared on day of discharge and patient is overall feeling much better.  MRSA PCR is noted to be negative and patient  will be sent home on Keflex for 10 more days to complete course of treatment.  He understands this and denies any further complaints or concerns with no other acute events noted during the course of this admission.  He is otherwise stable for discharge and will follow up with his PCP in the next 2 weeks.  Discharge Diagnoses:  Principal Problem:   Sepsis due to cellulitis [facility]) Active Problems:   Essential hypertension   CAD (coronary artery disease)   Gastroesophageal reflux disease   Cholelithiasis   Hyperlipidemia   Type 2 diabetes mellitus (HCC)   Hypokalemia   Hypocalcemia  Principal discharge diagnosis: Sepsis secondary to left lower extremity cellulitis.  Discharge Instructions  Discharge Instructions    Diet - low sodium heart healthy   Complete by:  As directed    Increase activity slowly   Complete by:  As directed      Allergies as of 05/31/2018      Reactions   Finasteride Other (See Comments)   Unable to void Unable to void Unable to void   Other Rash   Beta-blockers Beta-blockers Beta-blockers   Statins Hives, Rash   Aspirin Other (See Comments)   Cannot take daily, just once in a while Cannot take daily, just once in a while Other reaction(s): GI Upset (intolerance) Cannot take daily, just once in a while Daily dose upsets stomach      Medication List    TAKE these medications   acetaminophen 500 MG tablet Commonly known as:  TYLENOL Take 500 mg by mouth every 6 (six) hours as needed for mild pain.   aspirin EC 81 MG tablet Take 1 tablet (81 mg total) by mouth every other day.   carvedilol 6.25 MG tablet Commonly known as:  COREG Take 1 tablet (6.25  mg total) by mouth 2 (two) times daily.   cephALEXin 500 MG capsule Commonly known as:  KEFLEX Take 1 capsule (500 mg total) by mouth 4 (four) times daily for 10 days.   clotrimazole 1 % cream Commonly known as:  LOTRIMIN Apply topically 2 (two) times daily.   ezetimibe 10 MG tablet Commonly  known as:  ZETIA Take 10 mg by mouth daily.   gabapentin 400 MG capsule Commonly known as:  NEURONTIN Take 400 mg by mouth at bedtime.   hydrALAZINE 50 MG tablet Commonly known as:  APRESOLINE Take 1 tablet (50 mg total) by mouth See admin instructions. Patient only takes when he has elevated blood pressure readings.   irbesartan 300 MG tablet Commonly known as:  AVAPRO Take 1 tablet (300 mg total) by mouth daily.   metFORMIN 500 MG tablet Commonly known as:  GLUCOPHAGE Take 500 mg by mouth daily as needed (High Blood sugar above 140).   multivitamin with minerals Tabs tablet Take 1 tablet by mouth daily.   nitroGLYCERIN 0.4 MG SL tablet Commonly known as:  NITROSTAT Place 1 tablet (0.4 mg total) under the tongue every 5 (five) minutes as needed for chest pain.   pantoprazole 40 MG tablet Commonly known as:  PROTONIX Take 40 mg by mouth every other day.   potassium chloride SA 20 MEQ tablet Commonly known as:  K-DUR,KLOR-CON TAKE 1 TABLET BY MOUTH ONCE DAILY   torsemide 20 MG tablet Commonly known as:  DEMADEX Take 20 mg by mouth every other day.   Vitamin D (Ergocalciferol) 1.25 MG (50000 UT) Caps capsule Commonly known as:  DRISDOL TAKE ONE CAPSULE BY MOUTH ONCE A WEEK ON TUESDAYS   Vitamin D 50 MCG (2000 UT) Caps Take 2,000 Units by mouth daily.      Follow-up Information    Doree Albee, MD Follow up in 2 week(s).   Specialty:  Internal Medicine Contact information: [place] [address] [phone]        Burnell Blanks, MD .   Specialty:  Cardiology Contact information: [address]  [place] [address] [phone]          Allergies  Allergen Reactions  . Finasteride Other (See Comments)    Unable to void Unable to void Unable to void  . Other Rash    Beta-blockers Beta-blockers Beta-blockers  . Statins Hives and Rash  . Aspirin Other (See Comments)    Cannot take daily, just once in  a while Cannot take daily, just once in a while Other reaction(s): GI Upset (intolerance) Cannot take daily, just once in a while Daily dose upsets stomach    Consultations:  None   Procedures/Studies: Dg Foot Complete Left  Result Date: 05/29/2018 CLINICAL DATA:  Acute LEFT foot pain and swelling for 2 days. EXAM: LEFT FOOT - COMPLETE 3+ VIEW COMPARISON:  None. FINDINGS: There is no evidence of fracture or dislocation. There is no evidence of arthropathy or other focal bone abnormality. Soft tissues are unremarkable. IMPRESSION: Negative. Electronically Signed   By: Margarette Canada M.D.   On: 05/29/2018 22:52   Ct Renal Stone Study  Result Date: 05/29/2018 CLINICAL DATA:  68 year old male with acute LEFT abdominal and pelvic pain for several days. EXAM: CT ABDOMEN AND PELVIS WITHOUT CONTRAST TECHNIQUE: Multidetector CT imaging of the abdomen and pelvis was performed following the standard protocol without IV contrast. COMPARISON:  07/15/2016 and prior CTs FINDINGS: Please note that parenchymal abnormalities  may be missed without intravenous contrast. Lower chest: Mild cardiomegaly and coronary artery calcifications again noted. No acute abnormality. Hepatobiliary: No hepatic abnormalities identified. Cholelithiasis noted without CT evidence of acute cholecystitis. No biliary dilatation. Pancreas: Unremarkable Spleen: No significant abnormality Adrenals/Urinary Tract: The kidneys and adrenal glands are unremarkable. No hydronephrosis or urinary calculi. Mild circumferential bladder wall thickening again noted. Stomach/Bowel: Gastric surgical changes again noted. No bowel obstruction, definite bowel wall thickening or inflammatory changes. Vascular/Lymphatic: Aorto bi-iliac stent graft again identified. No interval change. Aortic caliber is unchanged. No enlarged lymph nodes identified. Reproductive: Prostate enlargement and surgical changes again noted. Other: No ascites, focal collection or  pneumoperitoneum. Musculoskeletal: No acute or suspicious abnormalities. Lumbar spine surgical changes and hardware again identified. IMPRESSION: 1. No evidence of acute abnormality. 2. Cholelithiasis without CT evidence of acute cholecystitis 3. Cardiomegaly, coronary disease and aorto bi-iliac stent graft again noted. No changes identified. 4. Enlarged prostate and surgical changes again noted. Electronically Signed   By: Margarette Canada M.D.   On: 05/29/2018 22:51     Discharge Exam: Vitals:   05/30/18 2138 05/31/18 0547  BP: (!) 142/79 (!) 142/75  Pulse: 63 66  Resp: 20 20  Temp: 98.7 F (37.1 C)   SpO2: 96% 95%   Vitals:   05/30/18 1411 05/30/18 1947 05/30/18 2138 05/31/18 0547  BP: (!) 117/56  (!) 142/79 (!) 142/75  Pulse: 72 73 63 66  Resp: 20 20 20 20   Temp: 98.3 F (36.8 C)  98.7 F (37.1 C)   TempSrc:   Oral   SpO2: 95% 93% 96% 95%  Weight:      Height:        General: Pt is alert, awake, not in acute distress Cardiovascular: RRR, S1/S2 +, no rubs, no gallops Respiratory: CTA bilaterally, no wheezing, no rhonchi Abdominal: Soft, NT, ND, bowel sounds + Extremities: no edema, no cyanosis Skin: Left lower extremity cellulitis with market improvement minimal erythema to the lower anterior shin with no tenderness.    The results of significant diagnostics from this hospitalization (including imaging, microbiology, ancillary and laboratory) are listed below for reference.     Microbiology: Recent Results (from the past 240 hour(s))  Culture, blood (routine x 2)     Status: None (Preliminary result)   Collection Time: 05/29/18  9:45 PM  Result Value Ref Range Status   Specimen Description RIGHT ANTECUBITAL  Final   Special Requests   Final    BOTTLES DRAWN AEROBIC AND ANAEROBIC Blood Culture adequate volume   Culture   Final    NO GROWTH 2 DAYS Performed at [facility], [address] [place], [place] [address]    Report Status PENDING  Incomplete  Culture, blood  (routine x 2)     Status: None (Preliminary result)   Collection Time: 05/29/18  9:49 PM  Result Value Ref Range Status   Specimen Description BLOOD LEFT ARM  Final   Special Requests   Final    BOTTLES DRAWN AEROBIC AND ANAEROBIC Blood Culture adequate volume   Culture   Final    NO GROWTH 2 DAYS Performed at [facility], [address] [place], [place] [address]    Report Status PENDING  Incomplete  MRSA PCR Screening     Status: None   Collection Time: 05/30/18  9:33 AM  Result Value Ref Range Status   MRSA by PCR NEGATIVE NEGATIVE Final    Comment:        The GeneXpert MRSA Assay (  FDA approved for NASAL specimens only), is one component of a comprehensive MRSA colonization surveillance program. It is not intended to diagnose MRSA infection nor to guide or monitor treatment for MRSA infections. Performed at [facility], [address] [place], [place] [address]      Labs: BNP (last 3 results) No results for input(s): BNP in the last 8760 hours. Basic Metabolic Panel: Recent Labs  Lab 05/29/18 2145 05/30/18 0407 05/31/18 0435  NA 135 134* 138  K 3.4* 3.6 4.3  CL 101 101 104  CO2 24 23 26   GLUCOSE 171* 299* 172*  BUN 20 17 12   CREATININE 0.97 0.85 0.90  CALCIUM 8.1* 7.8* 8.4*  MG  --  1.8  --   PHOS  --  3.5  --    Liver Function Tests: Recent Labs  Lab 05/29/18 2145  AST 27  ALT 29  ALKPHOS 65  BILITOT 0.2*  PROT 6.9  ALBUMIN 3.8   No results for input(s): LIPASE, AMYLASE in the last 168 hours. No results for input(s): AMMONIA in the last 168 hours. CBC: Recent Labs  Lab 05/30/18 0407 05/31/18 0435  WBC 5.2 5.7  NEUTROABS 2.8 3.4  HGB 12.8* 12.9*  HCT 40.0 40.8  MCV 96.6 97.6  PLT 123* 127*   Cardiac Enzymes: No results for input(s): CKTOTAL, CKMB, CKMBINDEX, TROPONINI in the last 168 hours. BNP: Invalid input(s): POCBNP CBG: Recent Labs  Lab 05/30/18 0748 05/30/18 1107 05/30/18 1643 05/30/18 2238 05/31/18 0751  GLUCAP 195*  175* 170* 120* 147*   D-Dimer No results for input(s): DDIMER in the last 72 hours. Hgb A1c No results for input(s): HGBA1C in the last 72 hours. Lipid Profile No results for input(s): CHOL, HDL, LDLCALC, TRIG, CHOLHDL, LDLDIRECT in the last 72 hours. Thyroid function studies No results for input(s): TSH, T4TOTAL, T3FREE, THYROIDAB in the last 72 hours.  Invalid input(s): FREET3 Anemia work up No results for input(s): VITAMINB12, FOLATE, FERRITIN, TIBC, IRON, RETICCTPCT in the last 72 hours. Urinalysis    Component Value Date/Time   COLORURINE STRAW (A) 05/29/2018 2200   APPEARANCEUR CLEAR 05/29/2018 2200   LABSPEC 1.008 05/29/2018 2200   PHURINE 5.0 05/29/2018 2200   GLUCOSEU NEGATIVE 05/29/2018 2200   HGBUR NEGATIVE 05/29/2018 2200   BILIRUBINUR NEGATIVE 05/29/2018 2200   KETONESUR NEGATIVE 05/29/2018 2200   PROTEINUR NEGATIVE 05/29/2018 2200   UROBILINOGEN 0.2 01/20/2014 1802   NITRITE NEGATIVE 05/29/2018 2200   LEUKOCYTESUR NEGATIVE 05/29/2018 2200   Sepsis Labs Invalid input(s): PROCALCITONIN,  WBC,  LACTICIDVEN Microbiology Recent Results (from the past 240 hour(s))  Culture, blood (routine x 2)     Status: None (Preliminary result)   Collection Time: 05/29/18  9:45 PM  Result Value Ref Range Status   Specimen Description RIGHT ANTECUBITAL  Final   Special Requests   Final    BOTTLES DRAWN AEROBIC AND ANAEROBIC Blood Culture adequate volume   Culture   Final    NO GROWTH 2 DAYS Performed at [facility], [address], [place], [place] [address]    Report Status PENDING  Incomplete  Culture, blood (routine x 2)     Status: None (Preliminary result)   Collection Time: 05/29/18  9:49 PM  Result Value Ref Range Status   Specimen Description BLOOD LEFT ARM  Final   Special Requests   Final    BOTTLES DRAWN AEROBIC AND ANAEROBIC Blood Culture adequate volume   Culture   Final    NO GROWTH 2 DAYS Performed  at [facility], [address] [place], [place]  [address]    Report Status PENDING  Incomplete  MRSA PCR Screening     Status: None   Collection Time: 05/30/18  9:33 AM  Result Value Ref Range Status   MRSA by PCR NEGATIVE NEGATIVE Final    Comment:        The GeneXpert MRSA Assay (FDA approved for NASAL specimens only), is one component of a comprehensive MRSA colonization surveillance program. It is not intended to diagnose MRSA infection nor to guide or monitor treatment for MRSA infections. Performed at [facility], [address] [place], [place] [address]      Time coordinating discharge: 35 minutes  SIGNED:   Rodena Goldmann, DO [facility] 05/31/2018, 9:08 AM  If 7PM-7AM, please contact night-coverage [email or web address] Password [email or web address]

## 2018-05-31 NOTE — Discharge Instructions (Addendum)

## 2018-06-01 LAB — HIV ANTIBODY (ROUTINE TESTING W REFLEX): HIV Screen 4th Generation wRfx: NONREACTIVE

## 2018-06-03 LAB — CULTURE, BLOOD (ROUTINE X 2)
Culture: NO GROWTH
Culture: NO GROWTH
Special Requests: ADEQUATE
Special Requests: ADEQUATE

## 2018-06-07 ENCOUNTER — Other Ambulatory Visit: Payer: Medicare Other

## 2018-06-15 DIAGNOSIS — G4733 Obstructive sleep apnea (adult) (pediatric): Secondary | ICD-10-CM | POA: Diagnosis not present

## 2018-06-15 DIAGNOSIS — E119 Type 2 diabetes mellitus without complications: Secondary | ICD-10-CM | POA: Diagnosis not present

## 2018-10-11 ENCOUNTER — Other Ambulatory Visit: Payer: Self-pay

## 2018-10-11 ENCOUNTER — Ambulatory Visit: Payer: Medicare Other | Admitting: Family

## 2018-10-11 ENCOUNTER — Ambulatory Visit (HOSPITAL_COMMUNITY)
Admission: RE | Admit: 2018-10-11 | Discharge: 2018-10-11 | Disposition: A | Discharge status: Home / Self Care | Payer: Medicare Other | Source: Ambulatory Visit | Attending: Family | Admitting: Family

## 2018-10-11 ENCOUNTER — Encounter: Payer: Self-pay | Admitting: Family

## 2018-10-11 VITALS — BP 110/61 | HR 79 | Temp 96.8°F | Resp 18 | Ht 71.0 in | Wt 291.0 lb

## 2018-10-11 DIAGNOSIS — I779 Disorder of arteries and arterioles, unspecified: Secondary | ICD-10-CM

## 2018-10-11 DIAGNOSIS — L03119 Cellulitis of unspecified part of limb: Secondary | ICD-10-CM | POA: Diagnosis not present

## 2018-10-11 DIAGNOSIS — R609 Edema, unspecified: Secondary | ICD-10-CM | POA: Diagnosis not present

## 2018-10-11 DIAGNOSIS — Z95828 Presence of other vascular implants and grafts: Secondary | ICD-10-CM

## 2018-10-11 DIAGNOSIS — Z87891 Personal history of nicotine dependence: Secondary | ICD-10-CM

## 2018-10-11 DIAGNOSIS — T82898S Other specified complication of vascular prosthetic devices, implants and grafts, sequela: Secondary | ICD-10-CM

## 2018-10-11 DIAGNOSIS — I714 Abdominal aortic aneurysm, without rupture, unspecified: Secondary | ICD-10-CM

## 2018-10-11 NOTE — Progress Notes (Signed)
[facility] [place]  CC: Follow up s/p Endovascular Repair of Abdominal Aortic Aneurysm and peripheral artery occlusive disease    History of Present Illness  Mathew Padilla is a 68 y.o. (19-Apr-1951) male who returns for follow-up evaluation of AAA. He underwent Zenith aneurysm stent graft repair in 2007 by Dr. Oneida Alar  The patient denies new abdominal or back pain. He has also undergone a left-to-right femoral-femoral bypass for limb occlusion of a Zenith graft. At the same time he had a right femoral to below-knee popliteal bypass with propaten. This was in December of 2012. He subsequently underwent a left above-knee to below-knee popliteal bypass with vein in March of 2013. This is chronically occluded. He reports no claudication symptoms. He does have some intermittent swelling of his lower extremities. He also complains of burning and stinging in both feet consistent with neuropathy. He has had minimal relief with Lyrica or Neurontin.   He has undergone multilevel back surgery which has significantly improved his neuropathy.   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  His feet swell quite a bit with sitting in his truck, but resolves by morning with overnight elevation.   He fell out of his truck about 2 weeks ago; since then his right leg feels numb from his right hips to his right foot, states this changes with his sitting position, states he thinks this is his back; he states that he had back problems and back surgery prior to this. He will see a back surgeon this week. He states that he was diagnosed with neuropathy in his legs. The arch of his left foot will draw up at times. He has been walking a bit less since he fell.   He does not have any open wounds or evidence of infection. He does not have rest pain.   Diabetic: Yes,  last A1C was 6.7 on 02/18/16, has lost a great deal of weight since the gastric sleeve procedure in June  2017 Tobaccos use: former smoker, quitin 1999, started about age 4    Past Medical History:  Diagnosis Date  . AAA (abdominal aortic aneurysm) ([place]) 2007   stent graft repair 12/07  . Arteriosclerotic cardiovascular disease (ASCVD)    a. MI in 1999;  b.  PTCA of PDA and 2nd marginal in 3/00;  c. 02/2012 Cath: LM nl, LAD 68m, LCX 58m, OM small, 70, RCA 99p/179m-->Med Rx;  d. 03/2014 MV: EF 32%, inflat infarct w/ mild peri-infarct isch towards apex (similar to 2013 MV); d. 03/2014 Echo: EF 45-50%, Gr 1 DD, mildly dil LA.  Marland Kitchen CAD (coronary artery disease)   . Cerebrovascular disease   . CHF (congestive heart failure) ([place])   . Cholelithiasis 11/28/2010  . Cough, persistent   . Diverticulitis 2006  . DVT (deep venous thrombosis) ([place])   . Elevated PSA 06/2011   a. s/p TURP 01/2014.  Marland Kitchen Gastroesophageal reflux disease    Hiatal hernia; previously treated with PPI  . Hypertension    Mild; controlled with a single agent    . Laryngeal carcinoma ([place]) 2010   resection and radiation therapy 2010-11  . Myocardial infarction ([place])   . Obesity    BMI 47; lap band surgery 2010  . 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 Sleep apnea    BiPAP mask utilized        2 yrs sleep study    dr Redmond Pulling  .  Tobacco abuse, in remission    40 pack years; discontinued in 2000  . Type 2 diabetes mellitus ([place])    Past Surgical History:  Procedure Laterality Date  . ANTERIOR LAT LUMBAR FUSION N/A 02/22/2016   Procedure: LUMBAR TWO-LUMBAR FIVE ANTERIOR LATERAL LUMBAR INTERBODY FUSION WITH PERCUTANEOUS PEDICLE SCREWS;  Surgeon: Kevan Ny Ditty, MD;  Location: [facility];  Service: Neurosurgery;  Laterality: N/A;  Left side approach  . CARDIAC CATHETERIZATION  2000   PCI-stent  . CATARACT EXTRACTION    . COLONOSCOPY  10/2007  . COLONOSCOPY WITH ESOPHAGOGASTRODUODENOSCOPY (EGD)  2009   RMR: He had distal esophageal erosions, patulous GE junction, erosive reflux esophagitis, hiatal hernia, lipoma on  the angularis not manipulated, left-sided diverticula.  . CYSTOSCOPY  02/22/2016   Procedure: CYSTOSCOPY FLEXIBLE WITH URETHER DILATION  WITH CATHETER PLACEMENT;  Surgeon: Alexis Frock, MD;  Location: [facility];  Service: Urology;;  . EYE SURGERY    . FEMORAL-FEMORAL BYPASS GRAFT  04/18/2011   Procedure: BYPASS GRAFT FEMORAL-FEMORAL ARTERY;  Surgeon: Theotis Burrow, MD;  Location: [facility];  Service: Vascular;  Laterality: Bilateral;  Left to right femoral to femoral bypass   . FEMORAL-POPLITEAL BYPASS GRAFT  04/18/2011   Procedure: BYPASS GRAFT FEMORAL-POPLITEAL ARTERY;  Surgeon: Theotis Burrow, MD;  Location: [facility];  Service: Vascular;  Laterality: Right;  Right femoral popliteal bypass with propaten gortex graft  . gastic sleeve  07/2015   gastic band removed and have sleeve  . LAPAROSCOPIC GASTRIC BANDING  2010  . LARYNX SURGERY  2010   Resection of carcinoma  . LEFT HEART CATH AND CORONARY ANGIOGRAPHY N/A 09/02/2017   Procedure: LEFT HEART CATH AND CORONARY ANGIOGRAPHY;  Surgeon: Burnell Blanks, MD;  Location: [place] [facility];  Service: Cardiovascular;  Laterality: N/A;  . LEFT HEART CATHETERIZATION WITH CORONARY ANGIOGRAM N/A 03/10/2012   Procedure: LEFT HEART CATHETERIZATION WITH CORONARY ANGIOGRAM;  Surgeon: Burnell Blanks, MD;  Location: [facility] CATH [facility];  Service: Cardiovascular;  Laterality: N/A;  . LUMBAR PERCUTANEOUS PEDICLE SCREW 3 LEVEL N/A 02/22/2016   Procedure: LUMBAR PERCUTANEOUS PEDICLE SCREW LUMBAR TWO-LUMBAR FIVE;  Surgeon: Kevan Ny Ditty, MD;  Location: [facility];  Service: Neurosurgery;  Laterality: N/A;  . OPEN ANTERIOR SHOULDER RECONSTRUCTION  2011   Left  . PERIPHERAL ARTERIAL STENT GRAFT  2007   for abdominal aortic aneurysm  . PR VEIN BYPASS GRAFT,AORTO-FEM-POP  07-23-11   Left AK to BK popliteal BPG using rev. saphenous vein   Social History Social History   Tobacco Use  . Smoking status: Former Smoker    Packs/day: 2.00    Years: 30.00     Pack years: 60.00    Types: Cigarettes    Quit date: 11/26/1997    Years since quitting: 20.8  . Smokeless tobacco: Never Used  . Tobacco comment: 40 pack years; quit 2000  Substance Use Topics  . Alcohol use: No    Alcohol/week: 0.0 standard drinks  . Drug use: No   Family History Family History  Problem Relation Age of Onset  . Heart attack Father        deceased  . Heart failure Father   . Hyperlipidemia Father   . Hypertension Father   . Heart disease Father   . Lung cancer Mother        deceased   . Hypertension Mother   . Hyperlipidemia Mother   . Heart failure Mother   . Diabetes Mother   . Cancer Sister  vaginal  . Diabetes Sister   . Colon cancer Neg Hx    Current Outpatient Medications on File Prior to Visit  Medication Sig Dispense Refill  . acetaminophen (TYLENOL) 500 MG tablet Take 500 mg by mouth every 6 (six) hours as needed for mild pain.    Marland Kitchen aspirin EC 81 MG tablet Take 1 tablet (81 mg total) by mouth every other day.    . carvedilol (COREG) 6.25 MG tablet Take 1 tablet (6.25 mg total) by mouth 2 (two) times daily. 180 tablet 3  . Cholecalciferol (VITAMIN D) 2000 units CAPS Take 2,000 Units by mouth daily.     . clotrimazole (LOTRIMIN) 1 % cream Apply topically 2 (two) times daily. 30 g 0  . ezetimibe (ZETIA) 10 MG tablet Take 10 mg by mouth daily.    Marland Kitchen gabapentin (NEURONTIN) 400 MG capsule Take 400 mg by mouth at bedtime.     . hydrALAZINE (APRESOLINE) 50 MG tablet Take 1 tablet (50 mg total) by mouth See admin instructions. Patient only takes when he has elevated blood pressure readings. 30 tablet 6  . irbesartan (AVAPRO) 300 MG tablet Take 1 tablet (300 mg total) by mouth daily. 90 tablet 3  . metFORMIN (GLUCOPHAGE) 500 MG tablet Take 500 mg by mouth daily as needed (High Blood sugar above 140).     . Multiple Vitamin (MULTIVITAMIN WITH MINERALS) TABS tablet Take 1 tablet by mouth daily.    . pantoprazole (PROTONIX) 40 MG tablet Take 40 mg by mouth  every other day.    . potassium chloride SA (K-DUR,KLOR-CON) 20 MEQ tablet TAKE 1 TABLET BY MOUTH ONCE DAILY 90 tablet 0  . torsemide (DEMADEX) 20 MG tablet Take 20 mg by mouth every other day.     . Vitamin D, Ergocalciferol, (DRISDOL) 50000 units CAPS capsule TAKE ONE CAPSULE BY MOUTH ONCE A WEEK ON TUESDAYS    . nitroGLYCERIN (NITROSTAT) 0.4 MG SL tablet Place 1 tablet (0.4 mg total) under the tongue every 5 (five) minutes as needed for chest pain. (Patient not taking: Reported on 10/11/2018) 25 tablet 3   No current facility-administered medications on file prior to visit.    Allergies  Allergen Reactions  . Finasteride Other (See Comments)    Unable to void Unable to void Unable to void  . Other Rash    Beta-blockers Beta-blockers Beta-blockers  . Statins Hives and Rash  . Aspirin Other (See Comments)    Cannot take daily, just once in a while Cannot take daily, just once in a while Other reaction(s): GI Upset (intolerance) Cannot take daily, just once in a while Daily dose upsets stomach     ROS: See HPI for pertinent positives and negatives.  Physical Examination  Vitals:   10/11/18 0915  BP: 110/61  Pulse: 79  Resp: 18  Temp: (!) 96.8 F (36 C)  TempSrc: Temporal  SpO2: 96%  Weight: 291 lb (132 kg)  Height: 5\' 11"  (1.803 m)   Body mass index is 40.59 kg/m.  General: A&O x 3, WD, morbidly obese male HEENT: No gross abnormalities, large neck  Pulmonary: Sym exp, respirations are non labored, good air movement in all fields CTAB, no rales, rhonchi, or wheezing. Cardiac: Regular rhythm and rate, no murmur appreciated  Vascular: Vessel Right Left  Radial 2+Palpable 2+Palpable  Carotid  without bruit  without bruit  Aorta Not palpable N/A  Femoral 2+Palpable 2+Palpable  Popliteal Not palpable Not palpable  PT Not Palpable Not Palpable  DP  Not Palpable 1+Palpable   Gastrointestinal: softly distended and obese, NTND, -G/R, - HSM, - palpable masses, - CVAT  B. Musculoskeletal: M/S 5/5 throughout, extremities without ischemic changes. 1+ pitting edema in ankles, dorsum of feet, and bilateral pretibial.  Skin: No rashes, no ulcers, no cellulitis. Thick toenails of great toes  Neurologic: Pain and light touch intact in extremities, Motor exam as listed above. Psychiatric: Normal thought content, mood appropriate for clinical situation.     DATA  ABI Findings (10-11-18): +---------+------------------+-----+---------+--------+ Right    Rt Pressure (mmHg)IndexWaveform Comment  +---------+------------------+-----+---------+--------+ Brachial 121                    triphasic         +---------+------------------+-----+---------+--------+ PTA      80                0.60 biphasic          +---------+------------------+-----+---------+--------+ DP       55                0.41 biphasic          +---------+------------------+-----+---------+--------+ Great Toe68                0.51 Abnormal          +---------+------------------+-----+---------+--------+  +---------+------------------+-----+----------+-------+ Left     Lt Pressure (mmHg)IndexWaveform  Comment +---------+------------------+-----+----------+-------+ Brachial 133                    triphasic         +---------+------------------+-----+----------+-------+ PTA      114               0.86 monophasic        +---------+------------------+-----+----------+-------+ DP       86                0.65 biphasic          +---------+------------------+-----+----------+-------+ Brion Aliment                0.65 Abnormal          +---------+------------------+-----+----------+-------+  +-------+-----------+-----------+------------+------------+ ABI/TBIToday's ABIToday's TBIPrevious ABIPrevious TBI +-------+-----------+-----------+------------+------------+ Right  0.60       0.51       0.72        0.60          +-------+-----------+-----------+------------+------------+ Left   0.86       0.65       0.82        0.62         +-------+-----------+-----------+------------+------------+ Bilateral ABIs and TBIs appear essentially unchanged compared to prior study on 07/30/2017.   Summary: Right: Resting right ankle-brachial index indicates moderate right lower extremity arterial disease. The right toe-brachial index is abnormal. RT great toe pressure = 68 mmHg. Left: Resting left ankle-brachial index indicates mild left lower extremity arterial disease. The left toe-brachial index is abnormal. LT Great toe pressure = 86 mmHg.    EVAR Duplex (Date: 07/30/17):  AAA sac size: 3.5 cm   no endoleak detected  Visualization of the mid and distal abdominal aorta, and proximal bilateral common iliac arteries was limited due to overlying bowel gas and obesity.   CTA Abd/Pelvis Duplex (Date: 07-15-16) 1. Post endovascular repair of infrarenal abdominal aortic aneurysm. The native abdominal aortic aneurysm has been completely excluded and remains opposed against the wall of the stent graft. No evidence of endoleak. 2. Re- demonstrated occlusion of the right iliac gait of the  abdominal aortic stent graft. 3. Stable sequela of left-to-right fem-fem bypass graft which appears widely patent. 4. Interval occlusion of the right femoral bypass graft, new compared to the 06/2014 examination, our there is minimal opacification of the native right superficial femoral artery. Clinical correlation is advised. 5. Chronic occlusion of the imaged portion of the left superficial femoral artery, unchanged.     Medical Decision Making  Mathew Padilla is a 68 y.o. male whois s/p Zenith aneurysm stent graft repair in 2007.   Historically he has had excessive bowel gas with limited visualization of abdominal vasculature. He states his C-PAP pushes air into his stomach. CT abd/pelvis in 2018 demonstrated stent  graft in good position, the aneurysm had completely shrunk down around the stent graft. The femoral-femoral bypass was patent.  He has also undergone a left-to-right femoral-femoral bypass for limb occlusion of a Zenith graftin 2012 by Dr. Trula Slade. At the same time he had a right femoral to below-knee popliteal bypass with propatenin 2012 by Dr. Wyline Mood was in December of 2012. He subsequently underwent a left above-knee to below-knee popliteal bypass with vein in March of 2013. He has occlusions of his bilateral femoropopliteal bypasses, chronic in nature and asymptomatic.  Right sciatic pain since he fell 2 weeks ago: he is scheduled to see a spine surgeon soon.   PAOD: walk as much as possible, daily seated leg exercises when safe. I advised him to notify us if he develops concerns re the circulation in his legs.   Since the June 2017 gastric sleeve procedure, he has lost about 40 pounds.   He is able to walk more after multilevel spine surgery, but somewhat less since he fell 2 weeks ago and developed right sciatic symptoms since then. He has an appointment to see a spine surgeon soon.   Referral to podiatrist in [place] to trim his thick toenails and perform diabetic feet exams.   Mild dependent edema in lower legs and feet, is a truck driver: knee high compression hose, 20-30 mm Hg; also to help prevent DVT, as a truck driver.     The next endograft duplex will be scheduled for 12 months, also ABI's.  I emphasized the importance of maximal medical management including strict control of blood pressure, blood glucose, and lipid levels, antiplatelet agents, obtaining regular exercise, and cessation of smoking.   Thank you for allowing [place] to participate in this patient's care.  Clemon Chambers, RN, MSN, FNP-C [facility] [place] Office: [facility] Physician: Trula Slade  10/11/2018, 10:17 AM

## 2018-10-11 NOTE — Patient Instructions (Addendum)
To measure for knee high compression hose: Measure the length of calf (from the crease of the knee to the bottom of the heel), largest circumference of calf, and ankle circumference first thing in the morning before your legs have a chance to swell.  Take these 3 measurements with you to obtain 20-30 mm mercury graduated knee high compression hose.  Put the stockings on in the morning, remove at bedtime.      Peripheral Vascular Disease  Peripheral vascular disease (PVD) is a disease of the blood vessels that are not part of your heart and brain. A simple term for PVD is poor circulation. In most cases, PVD narrows the blood vessels that carry blood from your heart to the rest of your body. This can reduce the supply of blood to your arms, legs, and internal organs, like your stomach or kidneys. However, PVD most often affects a person's lower legs and feet. Without treatment, PVD tends to get worse. PVD can also lead to acute ischemic limb. This is when an arm or leg suddenly cannot get enough blood. This is a medical emergency. Follow these instructions at home: Lifestyle  Do not use any products that contain nicotine or tobacco, such as cigarettes and e-cigarettes. If you need help quitting, ask your doctor.  Lose weight if you are overweight. Or, stay at a healthy weight as told by your doctor.  Eat a diet that is low in fat and cholesterol. If you need help, ask your doctor.  Exercise regularly. Ask your doctor for activities that are right for you. General instructions  Take over-the-counter and prescription medicines only as told by your doctor.  Take good care of your feet: ? Wear comfortable shoes that fit well. ? Check your feet often for any cuts or sores.  Keep all follow-up visits as told by your doctor This is important. Contact a doctor if:  You have cramps in your legs when you walk.  You have leg pain when you are at rest.  You have coldness in a leg or foot.   Your skin changes.  You are unable to get or have an erection (erectile dysfunction).  You have cuts or sores on your feet that do not heal. Get help right away if:  Your arm or leg turns cold, numb, and blue.  Your arms or legs become red, warm, swollen, painful, or numb.  You have chest pain.  You have trouble breathing.  You suddenly have weakness in your face, arm, or leg.  You become very confused or you cannot speak.  You suddenly have a very bad headache.  You suddenly cannot see. Summary  Peripheral vascular disease (PVD) is a disease of the blood vessels.  A simple term for PVD is poor circulation. Without treatment, PVD tends to get worse.  Treatment may include exercise, low fat and low cholesterol diet, and quitting smoking. This information is not intended to replace advice given to you by your health care provider. Make sure you discuss any questions you have with your health care provider. Document Released: 07/09/2009 Document Revised: 05/22/2016 Document Reviewed: 05/22/2016 Elsevier Interactive Patient Education  2019 [employer].

## 2018-10-13 ENCOUNTER — Other Ambulatory Visit: Payer: Self-pay

## 2018-10-13 NOTE — Patient Outreach (Signed)
[facility]) Care Management  10/13/2018  Mathew Padilla 1951-02-26 [id number]  Medication Adherence call to [name] HIPPA Compliant Voice message left with a call back number. [name] is showing past due on Metformin 500 mg under [place].   [place] Management Direct Dial [phone]  Fax [phone] [name]

## 2018-10-16 ENCOUNTER — Other Ambulatory Visit: Payer: Self-pay | Admitting: Physician Assistant

## 2018-10-18 ENCOUNTER — Other Ambulatory Visit: Payer: Self-pay | Admitting: Orthopaedic Surgery

## 2018-10-18 DIAGNOSIS — M4716 Other spondylosis with myelopathy, lumbar region: Secondary | ICD-10-CM

## 2018-11-06 ENCOUNTER — Other Ambulatory Visit: Payer: Self-pay | Admitting: Cardiovascular Disease

## 2018-11-22 ENCOUNTER — Ambulatory Visit
Admission: RE | Admit: 2018-11-22 | Discharge: 2018-11-22 | Disposition: A | Discharge status: Home / Self Care | Payer: Medicare Other | Source: Ambulatory Visit | Attending: Orthopaedic Surgery | Admitting: Orthopaedic Surgery

## 2018-11-22 DIAGNOSIS — M4716 Other spondylosis with myelopathy, lumbar region: Secondary | ICD-10-CM

## 2018-12-10 ENCOUNTER — Other Ambulatory Visit: Payer: Self-pay | Admitting: Cardiovascular Disease

## 2019-01-05 ENCOUNTER — Telehealth: Payer: Self-pay | Admitting: Cardiovascular Disease

## 2019-01-05 ENCOUNTER — Telehealth: Payer: Self-pay

## 2019-01-05 NOTE — Telephone Encounter (Signed)
I would have him keep taking the Avapro only once daily. He can begin taking the hydralazine 50 mg po TID. He has only been using this as needed per notes. Thanks,chris

## 2019-01-05 NOTE — Telephone Encounter (Signed)
New Message  Pt c/o BP issue: STAT if pt c/o blurred vision, one-sided weakness or slurred speech  1. What are your last 5 BP readings? 164/78, 152/79, 168/92, 175/95, 132/75, 157/83  2. Are you having any other symptoms (ex. Dizziness, headache, blurred vision, passed out)? No, has a headache   3. What is your BP issue? Patient states that blood pressure has been high lately and he doesn't feel like the medication is working to regulate blood pressure. To get blood pressure down, patient has been taking medication twice a day.

## 2019-01-05 NOTE — Telephone Encounter (Signed)
Pt reports taking an extra dose (300mg ) of Avapro d/t his increased BP. BP readings of 175/100 before medication and 157/83 about 6 hours after he has taken one dose of his Avapro. His HR has been 80's-90's.   He reports that this has happened before where medications he has taken just stop working after being on them for a while. He denies diet or lifestyle changes. Pt has begun taking 5 mg of Cyclobenzaprine daily or every other day for back pain prescribed by his neurosurgeon approximately 4 days ago. He reports that if he takes it as prescribed (daily every 8-12hrs) he will sleep all day.   He reports he has been having headaches for about a week, denies N/V, dizziness, increased SOB, diaphoresis etc.

## 2019-01-05 NOTE — Telephone Encounter (Signed)
Advised pt of med change to Hydralzaine 50 mg TID. Pt agreeable. Advised pt to continue monitoring his BP and HR and to call back end of week with his readings.

## 2019-01-28 ENCOUNTER — Emergency Department (HOSPITAL_COMMUNITY)
Admission: EM | Admit: 2019-01-28 | Discharge: 2019-01-28 | Disposition: A | Discharge status: Home / Self Care | Payer: Medicare Other | Attending: Emergency Medicine | Admitting: Emergency Medicine

## 2019-01-28 ENCOUNTER — Ambulatory Visit
Admission: EM | Admit: 2019-01-28 | Discharge: 2019-01-28 | Disposition: A | Discharge status: Home / Self Care | Payer: Medicare Other | Source: Home / Self Care | Attending: Emergency Medicine | Admitting: Emergency Medicine

## 2019-01-28 ENCOUNTER — Other Ambulatory Visit: Payer: Self-pay

## 2019-01-28 ENCOUNTER — Encounter (HOSPITAL_COMMUNITY): Payer: Self-pay | Admitting: Emergency Medicine

## 2019-01-28 ENCOUNTER — Emergency Department (HOSPITAL_COMMUNITY): Payer: Medicare Other

## 2019-01-28 DIAGNOSIS — E119 Type 2 diabetes mellitus without complications: Secondary | ICD-10-CM | POA: Insufficient documentation

## 2019-01-28 DIAGNOSIS — I110 Hypertensive heart disease with heart failure: Secondary | ICD-10-CM | POA: Diagnosis not present

## 2019-01-28 DIAGNOSIS — R509 Fever, unspecified: Secondary | ICD-10-CM

## 2019-01-28 DIAGNOSIS — J208 Acute bronchitis due to other specified organisms: Secondary | ICD-10-CM | POA: Insufficient documentation

## 2019-01-28 DIAGNOSIS — R079 Chest pain, unspecified: Secondary | ICD-10-CM

## 2019-01-28 DIAGNOSIS — Z7984 Long term (current) use of oral hypoglycemic drugs: Secondary | ICD-10-CM | POA: Insufficient documentation

## 2019-01-28 DIAGNOSIS — R0981 Nasal congestion: Secondary | ICD-10-CM

## 2019-01-28 DIAGNOSIS — R519 Headache, unspecified: Secondary | ICD-10-CM | POA: Diagnosis not present

## 2019-01-28 DIAGNOSIS — R109 Unspecified abdominal pain: Secondary | ICD-10-CM

## 2019-01-28 DIAGNOSIS — Z79899 Other long term (current) drug therapy: Secondary | ICD-10-CM | POA: Insufficient documentation

## 2019-01-28 DIAGNOSIS — R0602 Shortness of breath: Secondary | ICD-10-CM | POA: Diagnosis present

## 2019-01-28 DIAGNOSIS — B9689 Other specified bacterial agents as the cause of diseases classified elsewhere: Secondary | ICD-10-CM

## 2019-01-28 DIAGNOSIS — Z7982 Long term (current) use of aspirin: Secondary | ICD-10-CM | POA: Insufficient documentation

## 2019-01-28 DIAGNOSIS — I509 Heart failure, unspecified: Secondary | ICD-10-CM | POA: Diagnosis not present

## 2019-01-28 DIAGNOSIS — I25110 Atherosclerotic heart disease of native coronary artery with unstable angina pectoris: Secondary | ICD-10-CM | POA: Insufficient documentation

## 2019-01-28 DIAGNOSIS — R52 Pain, unspecified: Secondary | ICD-10-CM

## 2019-01-28 DIAGNOSIS — R05 Cough: Secondary | ICD-10-CM | POA: Diagnosis not present

## 2019-01-28 DIAGNOSIS — Z87891 Personal history of nicotine dependence: Secondary | ICD-10-CM | POA: Insufficient documentation

## 2019-01-28 DIAGNOSIS — J3489 Other specified disorders of nose and nasal sinuses: Secondary | ICD-10-CM

## 2019-01-28 DIAGNOSIS — Z20822 Contact with and (suspected) exposure to covid-19: Secondary | ICD-10-CM

## 2019-01-28 DIAGNOSIS — Z20828 Contact with and (suspected) exposure to other viral communicable diseases: Secondary | ICD-10-CM | POA: Insufficient documentation

## 2019-01-28 DIAGNOSIS — R197 Diarrhea, unspecified: Secondary | ICD-10-CM

## 2019-01-28 DIAGNOSIS — R110 Nausea: Secondary | ICD-10-CM

## 2019-01-28 LAB — CBC WITH DIFFERENTIAL/PLATELET
Abs Immature Granulocytes: 0.07 10*3/uL (ref 0.00–0.07)
Basophils Absolute: 0 10*3/uL (ref 0.0–0.1)
Basophils Relative: 0 %
Eosinophils Absolute: 0.1 10*3/uL (ref 0.0–0.5)
Eosinophils Relative: 1 %
HCT: 43.1 % (ref 39.0–52.0)
Hemoglobin: 13.9 g/dL (ref 13.0–17.0)
Immature Granulocytes: 1 %
Lymphocytes Relative: 10 %
Lymphs Abs: 1.5 10*3/uL (ref 0.7–4.0)
MCH: 30.9 pg (ref 26.0–34.0)
MCHC: 32.3 g/dL (ref 30.0–36.0)
MCV: 95.8 fL (ref 80.0–100.0)
Monocytes Absolute: 1.2 10*3/uL — ABNORMAL HIGH (ref 0.1–1.0)
Monocytes Relative: 8 %
Neutro Abs: 12.2 10*3/uL — ABNORMAL HIGH (ref 1.7–7.7)
Neutrophils Relative %: 80 %
Platelets: 134 10*3/uL — ABNORMAL LOW (ref 150–400)
RBC: 4.5 MIL/uL (ref 4.22–5.81)
RDW: 13.2 % (ref 11.5–15.5)
WBC: 15 10*3/uL — ABNORMAL HIGH (ref 4.0–10.5)
nRBC: 0 % (ref 0.0–0.2)

## 2019-01-28 LAB — COMPREHENSIVE METABOLIC PANEL
ALT: 23 U/L (ref 0–44)
AST: 21 U/L (ref 15–41)
Albumin: 3.6 g/dL (ref 3.5–5.0)
Alkaline Phosphatase: 75 U/L (ref 38–126)
Anion gap: 11 (ref 5–15)
BUN: 12 mg/dL (ref 8–23)
CO2: 21 mmol/L — ABNORMAL LOW (ref 22–32)
Calcium: 8.6 mg/dL — ABNORMAL LOW (ref 8.9–10.3)
Chloride: 99 mmol/L (ref 98–111)
Creatinine, Ser: 0.78 mg/dL (ref 0.61–1.24)
GFR calc Af Amer: >60 mL/min (ref >60)
GFR calc non Af Amer: >60 mL/min (ref >60)
Glucose, Bld: 136 mg/dL — ABNORMAL HIGH (ref 70–99)
Potassium: 3.9 mmol/L (ref 3.5–5.1)
Sodium: 131 mmol/L — ABNORMAL LOW (ref 135–145)
Total Bilirubin: 0.8 mg/dL (ref 0.3–1.2)
Total Protein: 7.3 g/dL (ref 6.5–8.1)

## 2019-01-28 LAB — LACTIC ACID, PLASMA
Lactic Acid, Venous: 1.5 mmol/L (ref 0.5–1.9)
Lactic Acid, Venous: 1.2 mmol/L (ref 0.5–1.9)

## 2019-01-28 LAB — PROTIME-INR
INR: 1.2 (ref 0.8–1.2)
Prothrombin Time: 14.7 seconds (ref 11.4–15.2)

## 2019-01-28 LAB — APTT: aPTT: 34 seconds (ref 24–36)

## 2019-01-28 LAB — SARS CORONAVIRUS 2 BY RT PCR (HOSPITAL ORDER, PERFORMED IN ~~LOC~~ HOSPITAL LAB): SARS Coronavirus 2: NEGATIVE

## 2019-01-28 LAB — LIPASE, BLOOD: Lipase: 24 U/L (ref 11–51)

## 2019-01-28 MED ORDER — SODIUM CHLORIDE 0.9 % IV BOLUS
1000.0000 mL | Freq: Once | INTRAVENOUS | Status: AC
  Administered 2019-01-28: 21:00:00 1000 mL via INTRAVENOUS

## 2019-01-28 MED ORDER — SODIUM CHLORIDE 0.9 % IV SOLN
500.0000 mg | INTRAVENOUS | Status: DC
  Administered 2019-01-28: 21:00:00 500 mg via INTRAVENOUS
  Filled 2019-01-28: qty 500

## 2019-01-28 MED ORDER — ALBUTEROL SULFATE (2.5 MG/3ML) 0.083% IN NEBU: 5.0000 mg | INHALATION_SOLUTION | Freq: Once | RESPIRATORY_TRACT | Status: DC

## 2019-01-28 MED ORDER — SODIUM CHLORIDE 0.9 % IV SOLN
2.0000 g | INTRAVENOUS | Status: DC
  Administered 2019-01-28: 2 g via INTRAVENOUS
  Filled 2019-01-28: qty 20

## 2019-01-28 MED ORDER — AZITHROMYCIN 250 MG PO TABS: 250.0000 mg | ORAL_TABLET | Freq: Every day | ORAL | 0 refills | Status: AC

## 2019-01-28 NOTE — ED Notes (Signed)
Ambulated pt around nursing station. Lowest O2 sat noted was 95%

## 2019-01-28 NOTE — ED Provider Notes (Signed)
HPI  SUBJECTIVE:  Mathew Padilla is a 68 y.o. male who presents with 2 days of headache, fevers T-max 100.4, chills, body aches, nasal congestion, rhinorrhea, postnasal drip, nausea, sharp intermittent periumbilical abdominal pain followed by diarrhea.  He reports a cough productive of thick yellow sputum, worsening shortness of breath, dyspnea on exertion to 100 feet.  He reports left-sided chest pain described as stabbing, intermittent, seconds.  It does not radiate up his neck, down his arm or through to his back.  There is no exertional or positional component to it.  It is not described as pressure or heaviness.  States it does not feel like his previous heart attack.  There are no aggravating or alleviating factors.  Is a has not tried anything for this.  He denies sore throat, loss of sense of taste or smell, vomiting, wheezing.  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 he has been taking Advil and pushing fluids.  The Advil helps with body ache.  No aggravating factors.  No antipyretics in the past 4 to 6 hours, antibiotics in the past 3 months.  He has a past medical history of MI, coronary artery disease, hypertension, hypercholesterolemia, aortic abdominal aneurysm, pneumonia, sepsis, diabetes, peripheral vascular disease, left lower extremity DVT, laryngeal cancer.  He is a former smoker.  No history of PE, asthma, emphysema, COPD.  [id number], Nimish C, MD      Past Medical History:  Diagnosis Date  . AAA (abdominal aortic aneurysm) ([place]) 2007   stent graft repair 12/07  . Arteriosclerotic cardiovascular disease (ASCVD)    a. MI in 1999;  b.  PTCA of PDA and 2nd marginal in 3/00;  c. 02/2012 Cath: LM nl, LAD 91m, LCX 56m, OM small, 70, RCA 99p/123m-->Med Rx;  d. 03/2014 MV: EF 32%, inflat infarct w/ mild peri-infarct isch towards apex (similar to 2013 MV); d. 03/2014 Echo: EF 45-50%, Gr 1 DD, mildly dil LA.  Marland Kitchen CAD (coronary artery disease)   . Cerebrovascular  disease   . CHF (congestive heart failure) ([place])   . Cholelithiasis 11/28/2010  . Cough, persistent   . Diverticulitis 2006  . DVT (deep venous thrombosis) ([place])   . Elevated PSA 06/2011   a. s/p TURP 01/2014.  Marland Kitchen Gastroesophageal reflux disease    Hiatal hernia; previously treated with PPI  . Hypertension    Mild; controlled with a single agent    . Laryngeal carcinoma ([place]) 2010   resection and radiation therapy 2010-11  . Myocardial infarction ([place])   . Obesity    BMI 47; lap band surgery 2010  . 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 Sleep apnea    BiPAP mask utilized        2 yrs sleep study    dr Redmond Pulling  . Tobacco abuse, in remission    40 pack years; discontinued in 2000  . Type 2 diabetes mellitus ([place])     Past Surgical History:  Procedure Laterality Date  . ANTERIOR LAT LUMBAR FUSION N/A 02/22/2016   Procedure: LUMBAR TWO-LUMBAR FIVE ANTERIOR LATERAL LUMBAR INTERBODY FUSION WITH PERCUTANEOUS PEDICLE SCREWS;  Surgeon: Kevan Ny Ditty, MD;  Location: [place];  Service: Neurosurgery;  Laterality: N/A;  Left side approach  . CARDIAC CATHETERIZATION  2000   PCI-stent  . CATARACT EXTRACTION    . COLONOSCOPY  10/2007  . COLONOSCOPY WITH ESOPHAGOGASTRODUODENOSCOPY (EGD)  2009   RMR: He had distal esophageal erosions, patulous GE  junction, erosive reflux esophagitis, hiatal hernia, lipoma on the angularis not manipulated, left-sided diverticula.  . CYSTOSCOPY  02/22/2016   Procedure: CYSTOSCOPY FLEXIBLE WITH URETHER DILATION  WITH CATHETER PLACEMENT;  Surgeon: Alexis Frock, MD;  Location: [place];  Service: Urology;;  . EYE SURGERY    . FEMORAL-FEMORAL BYPASS GRAFT  04/18/2011   Procedure: BYPASS GRAFT FEMORAL-FEMORAL ARTERY;  Surgeon: Theotis Burrow, MD;  [facility];  Service: Vascular;  Laterality: Bilateral;  Left to right femoral to femoral bypass   . FEMORAL-POPLITEAL BYPASS GRAFT  04/18/2011   Procedure: BYPASS GRAFT FEMORAL-POPLITEAL ARTERY;   Surgeon: Theotis Burrow, MD;  [facility];  Service: Vascular;  Laterality: Right;  Right femoral popliteal bypass with propaten gortex graft  . gastic sleeve  07/2015   gastic band removed and have sleeve  . LAPAROSCOPIC GASTRIC BANDING  2010  . LARYNX SURGERY  2010   Resection of carcinoma  . LEFT HEART CATH AND CORONARY ANGIOGRAPHY N/A 09/02/2017   Procedure: LEFT HEART CATH AND CORONARY ANGIOGRAPHY;  Surgeon: Burnell Blanks, MD;  Location: [place] [facility];  Service: Cardiovascular;  Laterality: N/A;  . LEFT HEART CATHETERIZATION WITH CORONARY ANGIOGRAM N/A 03/10/2012   Procedure: LEFT HEART CATHETERIZATION WITH CORONARY ANGIOGRAM;  Surgeon: Burnell Blanks, MD;  [facility] CATH LAB;  Service: Cardiovascular;  Laterality: N/A;  . LUMBAR PERCUTANEOUS PEDICLE SCREW 3 LEVEL N/A 02/22/2016   Procedure: LUMBAR PERCUTANEOUS PEDICLE SCREW LUMBAR TWO-LUMBAR FIVE;  Surgeon: Kevan Ny Ditty, MD;  Location: [place];  Service: Neurosurgery;  Laterality: N/A;  . OPEN ANTERIOR SHOULDER RECONSTRUCTION  2011   Left  . PERIPHERAL ARTERIAL STENT GRAFT  2007   for abdominal aortic aneurysm  . PR VEIN BYPASS GRAFT,AORTO-FEM-POP  07-23-11   Left AK to BK popliteal BPG using rev. saphenous vein    Family History  Problem Relation Age of Onset  . Heart attack Father        deceased  . Heart failure Father   . Hyperlipidemia Father   . Hypertension Father   . Heart disease Father   . Lung cancer Mother        deceased   . Hypertension Mother   . Hyperlipidemia Mother   . Heart failure Mother   . Diabetes Mother   . Cancer Sister        vaginal  . Diabetes Sister   . Colon cancer Neg Hx     Social History   Tobacco Use  . Smoking status: Former Smoker    Packs/day: 2.00    Years: 30.00    Pack years: 60.00    Types: Cigarettes    Quit date: 11/26/1997    Years since quitting: 21.1  . Smokeless tobacco: Never Used  . Tobacco comment: 40 pack years; quit 2000   Substance Use Topics  . Alcohol use: No    Alcohol/week: 0.0 standard drinks  . Drug use: No    No current facility-administered medications for this encounter.   Current Outpatient Medications:  .  acetaminophen (TYLENOL) 500 MG tablet, Take 500 mg by mouth every 6 (six) hours as needed for mild pain., Disp: , Rfl:  .  aspirin EC 81 MG tablet, Take 1 tablet (81 mg total) by mouth every other day., Disp: , Rfl:  .  carvedilol (COREG) 6.25 MG tablet, Take 1 tablet (6.25 mg total) by mouth 2 (two) times daily., Disp: 180 tablet, Rfl: 3 .  Cholecalciferol (VITAMIN D) 2000 units CAPS,  Take 2,000 Units by mouth daily. , Disp: , Rfl:  .  clotrimazole (LOTRIMIN) 1 % cream, Apply topically 2 (two) times daily., Disp: 30 g, Rfl: 0 .  ezetimibe (ZETIA) 10 MG tablet, Take 1 tablet by mouth once daily, Disp: 90 tablet, Rfl: 0 .  gabapentin (NEURONTIN) 400 MG capsule, Take 400 mg by mouth at bedtime. , Disp: , Rfl:  .  hydrALAZINE (APRESOLINE) 50 MG tablet, TAKE 1 TABLET BY MOUTH AS DIRECTED - PT ONLY TAKES WHEN HE HAS ELEVATED BLOOD PRESSURE READINGS, Disp: 30 tablet, Rfl: 2 .  irbesartan (AVAPRO) 300 MG tablet, Take 1 tablet (300 mg total) by mouth daily., Disp: 90 tablet, Rfl: 3 .  metFORMIN (GLUCOPHAGE) 500 MG tablet, Take 500 mg by mouth daily as needed (High Blood sugar above 140). , Disp: , Rfl:  .  Multiple Vitamin (MULTIVITAMIN WITH MINERALS) TABS tablet, Take 1 tablet by mouth daily., Disp: , Rfl:  .  nitroGLYCERIN (NITROSTAT) 0.4 MG SL tablet, Place 1 tablet (0.4 mg total) under the tongue every 5 (five) minutes as needed for chest pain. (Patient not taking: Reported on 10/11/2018), Disp: 25 tablet, Rfl: 3 .  pantoprazole (PROTONIX) 40 MG tablet, Take 40 mg by mouth every other day., Disp: , Rfl:  .  potassium chloride SA (K-DUR,KLOR-CON) 20 MEQ tablet, TAKE 1 TABLET BY MOUTH ONCE DAILY, Disp: 90 tablet, Rfl: 0 .  torsemide (DEMADEX) 20 MG tablet, Take 20 mg by mouth every other day. , Disp: ,  Rfl:  .  Vitamin D, Ergocalciferol, (DRISDOL) 50000 units CAPS capsule, TAKE ONE CAPSULE BY MOUTH ONCE A WEEK ON TUESDAYS, Disp: , Rfl:   Allergies  Allergen Reactions  . Finasteride Other (See Comments)    Unable to void Unable to void Unable to void  . Other Rash    Beta-blockers Beta-blockers Beta-blockers  . Statins Hives and Rash  . Aspirin Other (See Comments)    Cannot take daily, just once in a while Cannot take daily, just once in a while Other reaction(s): GI Upset (intolerance) Cannot take daily, just once in a while Daily dose upsets stomach     ROS  As noted in HPI.   Physical Exam  BP 133/80   Pulse 79   Temp 99.5 F (37.5 C)   Resp (!) 26   SpO2 94%   Constitutional: Well developed, well nourished, dyspneic when talking. Eyes: PERRL, EOMI, conjunctiva normal bilaterally HENT: Normocephalic, atraumatic,mucus membranes moist Respiratory: Clear to auscultation bilaterally, no rales, no wheezing, no rhonchi.  No chest wall tenderness. Cardiovascular: Normal rate and rhythm, no murmurs, no gallops, no rubs GI: Soft, nondistended, normal bowel sounds, diffuse mild tenderness, no rebound, no guarding,  no palpable pulsatile masses.  Positive nontender large ventral hernia. Back: no CVAT skin: No rash, skin intact Musculoskeletal: No edema, no tenderness, no deformities Neurologic: Alert & oriented x 3, CN III-XII grossly intact, no motor deficits, sensation grossly intact Psychiatric: Speech and behavior appropriate   ED Course   Medications - No data to display  Orders Placed This Encounter  Procedures  . ED EKG    Standing Status:   Standing    Number of Occurrences:   1    Order Specific Question:   Reason for Exam    Answer:   Chest Pain   No results found for this or any previous visit (from the past 24 hour(s)). No results found.  ED Clinical Impression  1. Suspected COVID-19 virus infection  2. Shortness of breath      ED  Assessment/Plan  Patient desaturates to 88% on room air when he talks.  He appears slightly dyspneic even though his lungs are clear.  Suspect COVID versus pneumonia.  Patient has multiple medical comorbidities, and given his symptoms, feel that rapid COVID test, labs, chest x-ray and consideration for admission if he is COVID positive or has pneumonia is warranted. transferring to the [facility] emergency department is warranted.  EKG: Sinus rhythm, rate 85.  First-degree block.  Nonspecific intraventricular block.  No ST elevation.  No acute changes compared to EKGs from 12/2017, 07/2017.  Patient asymptomatic while EKG was obtained.  Vitals are acceptable, he does come up to 95% on room air when not talking, his EKG is normal.  Feel that he is stable to go to the ER via private vehicle.  Will have staff notify the ER.  Discussed MDM, treatment plan, and plan for follow-up with patient Discussed sn/sx that should prompt return to the ED. patient agrees with plan.   No orders of the defined types were placed in this encounter.   *This clinic note was created using Dragon dictation software. Therefore, there may be occasional mistakes despite careful proofreading.  ?    Melynda Ripple, MD 01/28/19 [phone]

## 2019-01-28 NOTE — ED Triage Notes (Signed)
Pt has had body aches with headache pt has also had fever of 100 last night. Pt states he is coughing up sputum and has chest pain under axillary area

## 2019-01-28 NOTE — ED Provider Notes (Signed)
[facility] EMERGENCY DEPARTMENT Provider Note   CSN: [id number] Arrival date & time: 01/28/19  1604     History   Chief Complaint Chief Complaint  Patient presents with  . Shortness of Breath    HPI Mathew Padilla is a 68 y.o. male.     68yo male with past medical history of diabetes, HTN, MI, CHF, former smoker-quit 1999, presents with body aches, productive cough, fever, diarrhea, body aches, headache, dizziness. Onset Wednesday night. Patient works as a Administrator, unknown sick contacts. Patient went to urgent care today and was sent to the ER. Patient was febrile on arrival, resolved without medications, patient states his temperature has been fluctuating.      Past Medical History:  Diagnosis Date  . AAA (abdominal aortic aneurysm) ([place]) 2007   stent graft repair 12/07  . Arteriosclerotic cardiovascular disease (ASCVD)    a. MI in 1999;  b.  PTCA of PDA and 2nd marginal in 3/00;  c. 02/2012 Cath: LM nl, LAD 40m, LCX 81m, OM small, 70, RCA 99p/156m-->Med Rx;  d. 03/2014 MV: EF 32%, inflat infarct w/ mild peri-infarct isch towards apex (similar to 2013 MV); d. 03/2014 Echo: EF 45-50%, Gr 1 DD, mildly dil LA.  Marland Kitchen CAD (coronary artery disease)   . Cerebrovascular disease   . CHF (congestive heart failure) ([place])   . Cholelithiasis 11/28/2010  . Cough, persistent   . Diverticulitis 2006  . DVT (deep venous thrombosis) ([place])   . Elevated PSA 06/2011   a. s/p TURP 01/2014.  Marland Kitchen Gastroesophageal reflux disease    Hiatal hernia; previously treated with PPI  . Hypertension    Mild; controlled with a single agent    . Laryngeal carcinoma ([place]) 2010   resection and radiation therapy 2010-11  . Myocardial infarction ([place])   . Obesity    BMI 47; lap band surgery 2010  . 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 Sleep apnea    BiPAP mask utilized        2 yrs sleep study    dr Redmond Pulling  . Tobacco abuse, in remission    40 pack years; discontinued in 2000  . Type 2  diabetes mellitus [facility])     Patient Active Problem List   Diagnosis Date Noted  . Sepsis due to cellulitis ([place]) 05/29/2018  . Type 2 diabetes mellitus ([place]) 05/29/2018  . Hypokalemia 05/29/2018  . Hypocalcemia 05/29/2018  . Unstable angina ([place])   . Hyperlipidemia 08/14/2017  . Biceps tendinopathy of right upper extremity 08/13/2017  . Nontraumatic complete tear of right rotator cuff 08/13/2017  . Lumbosacral spondylosis with radiculopathy 02/22/2016  . Pulmonary nodules/lesions, multiple 11/05/2013  . Prostate enlargement 11/05/2013  . Infection of urinary tract 11/05/2013  . Pyelonephritis 11/03/2013  . Sepsis-unclear source 11/03/2013  . History of laparoscopic adjustable gastric banding, APL, 12/19/2008. 05/12/2012  . Cold feet 04/01/2012  . Chest pain 04/01/2012  . Atherosclerosis of native arteries of the extremities with intermittent claudication 11/06/2011  . Popliteal aneurysm ([place]) 08/07/2011  . S/P bypass graft of extremity 08/07/2011  . Abdominal aneurysm without mention of rupture 07/17/2011  . Peripheral vascular disease, unspecified ([place]) 05/12/2011  . Pulmonary infiltrate 12/02/2010  . Cholelithiasis 11/28/2010  . Low back pain 11/25/2010  . Pneumonia 11/25/2010  . CAD (coronary artery disease)   . Laryngeal carcinoma ([place])   . AAA (abdominal aortic aneurysm) ([place])   . Diverticulitis   . Obesity   . Gastroesophageal reflux disease   .  Sleep apnea   . Tobacco abuse, in remission   . Cerebrovascular disease   . Essential hypertension 11/21/2009    Past Surgical History:  Procedure Laterality Date  . ANTERIOR LAT LUMBAR FUSION N/A 02/22/2016   Procedure: LUMBAR TWO-LUMBAR FIVE ANTERIOR LATERAL LUMBAR INTERBODY FUSION WITH PERCUTANEOUS PEDICLE SCREWS;  Surgeon: Kevan Ny Ditty, MD;  Location: [place];  Service: Neurosurgery;  Laterality: N/A;  Left side approach  . CARDIAC CATHETERIZATION  2000   PCI-stent  . CATARACT EXTRACTION    . COLONOSCOPY   10/2007  . COLONOSCOPY WITH ESOPHAGOGASTRODUODENOSCOPY (EGD)  2009   RMR: He had distal esophageal erosions, patulous GE junction, erosive reflux esophagitis, hiatal hernia, lipoma on the angularis not manipulated, left-sided diverticula.  . CYSTOSCOPY  02/22/2016   Procedure: CYSTOSCOPY FLEXIBLE WITH URETHER DILATION  WITH CATHETER PLACEMENT;  Surgeon: Alexis Frock, MD;  Location: [place];  Service: Urology;;  . EYE SURGERY    . FEMORAL-FEMORAL BYPASS GRAFT  04/18/2011   Procedure: BYPASS GRAFT FEMORAL-FEMORAL ARTERY;  Surgeon: Theotis Burrow, MD;  [facility];  Service: Vascular;  Laterality: Bilateral;  Left to right femoral to femoral bypass   . FEMORAL-POPLITEAL BYPASS GRAFT  04/18/2011   Procedure: BYPASS GRAFT FEMORAL-POPLITEAL ARTERY;  Surgeon: Theotis Burrow, MD;  [facility];  Service: Vascular;  Laterality: Right;  Right femoral popliteal bypass with propaten gortex graft  . gastic sleeve  07/2015   gastic band removed and have sleeve  . LAPAROSCOPIC GASTRIC BANDING  2010  . LARYNX SURGERY  2010   Resection of carcinoma  . LEFT HEART CATH AND CORONARY ANGIOGRAPHY N/A 09/02/2017   Procedure: LEFT HEART CATH AND CORONARY ANGIOGRAPHY;  Surgeon: Burnell Blanks, MD;  Location: [place] CV [facility];  Service: Cardiovascular;  Laterality: N/A;  . LEFT HEART CATHETERIZATION WITH CORONARY ANGIOGRAM N/A 03/10/2012   Procedure: LEFT HEART CATHETERIZATION WITH CORONARY ANGIOGRAM;  Surgeon: Burnell Blanks, MD;  Location: [facility] CATH LAB;  Service: Cardiovascular;  Laterality: N/A;  . LUMBAR PERCUTANEOUS PEDICLE SCREW 3 LEVEL N/A 02/22/2016   Procedure: LUMBAR PERCUTANEOUS PEDICLE SCREW LUMBAR TWO-LUMBAR FIVE;  Surgeon: Kevan Ny Ditty, MD;  Location: [place];  Service: Neurosurgery;  Laterality: N/A;  . OPEN ANTERIOR SHOULDER RECONSTRUCTION  2011   Left  . PERIPHERAL ARTERIAL STENT GRAFT  2007   for abdominal aortic aneurysm  . PR VEIN BYPASS GRAFT,AORTO-FEM-POP  07-23-11    Left AK to BK popliteal BPG using rev. saphenous vein        Home Medications    Prior to Admission medications   Medication Sig Start Date End Date Taking? Authorizing Provider  acetaminophen (TYLENOL) 500 MG tablet Take 500 mg by mouth every 6 (six) hours as needed for mild pain.    [provider]  aspirin EC 81 MG tablet Take 1 tablet (81 mg total) by mouth every other day. 08/14/17   Richardson Dopp T, PA-C  carvedilol (COREG) 6.25 MG tablet Take 1 tablet (6.25 mg total) by mouth 2 (two) times daily. 12/29/17   Richardson Dopp T, PA-C  Cholecalciferol (VITAMIN D) 2000 units CAPS Take 2,000 Units by mouth daily.     [provider]  clotrimazole (LOTRIMIN) 1 % cream Apply topically 2 (two) times daily. 05/31/18   Manuella Ghazi, Pratik D, DO  ezetimibe (ZETIA) 10 MG tablet Take 1 tablet by mouth once daily 10/20/18   Burnell Blanks, MD  gabapentin (NEURONTIN) 400 MG capsule Take 400 mg by mouth at bedtime.  [provider]  hydrALAZINE (APRESOLINE) 50 MG tablet TAKE 1 TABLET BY MOUTH AS DIRECTED - PT ONLY TAKES WHEN HE HAS ELEVATED BLOOD PRESSURE READINGS 12/10/18   Burnell Blanks, MD  irbesartan (AVAPRO) 300 MG tablet Take 1 tablet (300 mg total) by mouth daily. 05/27/18   Burnell Blanks, MD  metFORMIN (GLUCOPHAGE) 500 MG tablet Take 500 mg by mouth daily as needed (High Blood sugar above 140).     [provider]  Multiple Vitamin (MULTIVITAMIN WITH MINERALS) TABS tablet Take 1 tablet by mouth daily.    [provider]  nitroGLYCERIN (NITROSTAT) 0.4 MG SL tablet Place 1 tablet (0.4 mg total) under the tongue every 5 (five) minutes as needed for chest pain. Patient not taking: Reported on 10/11/2018 05/27/18   Burnell Blanks, MD  pantoprazole (PROTONIX) 40 MG tablet Take 40 mg by mouth every other day.    [provider]  potassium chloride SA (K-DUR,KLOR-CON) 20 MEQ tablet TAKE 1 TABLET BY MOUTH ONCE DAILY 05/13/18    Burnell Blanks, MD  torsemide (DEMADEX) 20 MG tablet Take 20 mg by mouth every other day.     [provider]  Vitamin D, Ergocalciferol, (DRISDOL) 50000 units CAPS capsule TAKE ONE CAPSULE BY MOUTH ONCE A WEEK ON TUESDAYS 02/20/17   [provider]    Family History Family History  Problem Relation Age of Onset  . Heart attack Father        deceased  . Heart failure Father   . Hyperlipidemia Father   . Hypertension Father   . Heart disease Father   . Lung cancer Mother        deceased   . Hypertension Mother   . Hyperlipidemia Mother   . Heart failure Mother   . Diabetes Mother   . Cancer Sister        vaginal  . Diabetes Sister   . Colon cancer Neg Hx     Social History Social History   Tobacco Use  . Smoking status: Former Smoker    Packs/day: 2.00    Years: 30.00    Pack years: 60.00    Types: Cigarettes    Quit date: 11/26/1997    Years since quitting: 21.1  . Smokeless tobacco: Never Used  . Tobacco comment: 40 pack years; quit 2000  Substance Use Topics  . Alcohol use: No    Alcohol/week: 0.0 standard drinks  . Drug use: No     Allergies   Finasteride, Other, Statins, and Aspirin   Review of Systems Review of Systems  Constitutional: Positive for chills, diaphoresis, fatigue and fever.  HENT: Positive for congestion. Negative for sore throat.   Respiratory: Positive for cough and shortness of breath.   Cardiovascular: Negative for chest pain.  Gastrointestinal: Positive for abdominal pain, diarrhea and nausea. Negative for vomiting.       Feels sore, generalized  Genitourinary: Negative for decreased urine volume and dysuria.  Musculoskeletal: Positive for arthralgias and myalgias.  Skin: Negative for wound.  Allergic/Immunologic: Positive for immunocompromised state.  Neurological: Positive for dizziness and headaches.  Psychiatric/Behavioral: Positive for sleep disturbance.  All other systems reviewed and are negative.     Physical Exam Updated Vital Signs BP (!) 102/49   Pulse 77   Temp 99.7 F (37.6 C) (Oral)   Resp 15   Ht 5\' 11"  (1.803 m)   Wt 131.5 kg   SpO2 97%   BMI 40.45 kg/m   Physical Exam  Vitals signs and nursing note reviewed.  Neurological:     Mental Status: He is alert.      ED Treatments / Results  Labs (all labs ordered are listed, but only abnormal results are displayed) Labs Reviewed  CULTURE, BLOOD (ROUTINE X 2)  CULTURE, BLOOD (ROUTINE X 2)  SARS CORONAVIRUS 2 (HOSPITAL ORDER, [facility])  LACTIC ACID, PLASMA  LACTIC ACID, PLASMA  URINALYSIS, ROUTINE W REFLEX MICROSCOPIC  CBC WITH DIFFERENTIAL/PLATELET  COMPREHENSIVE METABOLIC PANEL  LIPASE, BLOOD    EKG None  Radiology No results found.  Procedures Procedures (including critical care time)  Medications Ordered in ED Medications  sodium chloride 0.9 % bolus 1,000 mL (has no administration in time range)     Initial Impression / Assessment and Plan / ED Course  I have reviewed the triage vital signs and the nursing notes.  Pertinent labs & imaging results that were available during my care of the patient were reviewed by me and considered in my medical decision making (see chart for details).  Clinical Course as of Jan 27 2201  Fri Jan 28, 2836  [id number] 68 year old male with past medical history of coronary artery disease, CHF, diabetes, MI, hypertension, hyperlipidemia presents with complaint of fevers, chills, sweats, productive cough (yellow sputum) with shortness of breath as well as nausea and diarrhea with general abdominal soreness, also reports feeling dizzy and generally weak.  On exam patient appears mildly dyspneic, he is satting 97% on room air at rest, while speaking he does drop as low as 88% on room air.  Further review of vitals, patient states that he has not taken his blood pressure medication for the last 2 days because his blood pressure has been low at  home, blood pressures currently 102/49, states if he does not take his medication his blood pressures typically in the [id number] systolic.  Patient's temperature at triage today was 102.3 oral, he was not given any antipyretics, has not taken any antipyretics today, repeat temperature is 99.7. Abdomen is soft and nontender, lung sounds are clear. I have ordered fluids, labs, rapid COVID and chest x-ray for patient.   [LM]  2159 Chest x-ray is clear.  Lactic acid is negative.  CMP without significant findings, CBC with mild leukocytosis of 15,000.  Patient is feeling better after fluids and IV antibiotics.  Plan is to discharge patient home on Zithromax.  Patient return to ER for any new or worsening symptoms otherwise follow-up with his PCP for recheck.   [LM]    Clinical Course User Index [LM] Tacy Learn, PA-C      Final Clinical Impressions(s) / ED Diagnoses   Final diagnoses:  SOB (shortness of breath)    ED Discharge Orders    None       Roque Lias 01/28/19 2202    Maudie Flakes, MD 01/30/19 2116

## 2019-01-28 NOTE — Discharge Instructions (Addendum)
Your EKG was unchanged from your last 2 EKGs.  I feel that is safe for you to go to the emergency department via private vehicle, however I would go there immediately.  Let them know if your chest pain returns or if your shortness of breath gets worse.

## 2019-01-28 NOTE — ED Triage Notes (Signed)
Pt is truck driver.  C/o SOB since Wednesday.  C/o most of COVID symptoms.  Headache, body ache, SOB, sinus, productive cough, abdominal pain, diarrhea, fever at home 100.4. Temp in triage 102.3.  Last Advil dose taken yesterday.

## 2019-01-28 NOTE — Discharge Instructions (Signed)
Take Zithromax as prescribed and complete the full course. Recheck with your doctor on Monday. Return to the ER at anytime for new or worsening symptoms.

## 2019-02-02 LAB — CULTURE, BLOOD (ROUTINE X 2)
Culture: NO GROWTH
Culture: NO GROWTH
Special Requests: ADEQUATE
Special Requests: ADEQUATE

## 2019-02-07 ENCOUNTER — Ambulatory Visit (INDEPENDENT_AMBULATORY_CARE_PROVIDER_SITE_OTHER): Payer: Medicare Other | Admitting: Internal Medicine

## 2019-02-22 ENCOUNTER — Other Ambulatory Visit: Payer: Self-pay | Admitting: Cardiovascular Disease

## 2019-03-10 ENCOUNTER — Telehealth: Payer: Self-pay | Admitting: Nurse Practitioner

## 2019-03-10 ENCOUNTER — Other Ambulatory Visit: Payer: Self-pay | Admitting: Orthopaedic Surgery

## 2019-03-10 DIAGNOSIS — M546 Pain in thoracic spine: Secondary | ICD-10-CM

## 2019-03-10 DIAGNOSIS — M5136 Other intervertebral disc degeneration, lumbar region: Secondary | ICD-10-CM

## 2019-03-10 NOTE — Telephone Encounter (Signed)
Phone call to patient to verify medication list and allergies for myelogram procedure. Pt aware he will not need to hold any medications for this procedure. Pre and post procedure instructions reviewed with pt. Pt verbalized understanding.  

## 2019-03-15 ENCOUNTER — Ambulatory Visit
Admission: RE | Admit: 2019-03-15 | Discharge: 2019-03-15 | Disposition: A | Discharge status: Home / Self Care | Payer: Medicare Other | Source: Ambulatory Visit | Attending: Orthopaedic Surgery | Admitting: Orthopaedic Surgery

## 2019-03-15 ENCOUNTER — Other Ambulatory Visit: Payer: Self-pay

## 2019-03-15 VITALS — BP 154/76 | HR 68

## 2019-03-15 DIAGNOSIS — M5136 Other intervertebral disc degeneration, lumbar region: Secondary | ICD-10-CM

## 2019-03-15 DIAGNOSIS — M546 Pain in thoracic spine: Secondary | ICD-10-CM

## 2019-03-15 DIAGNOSIS — M4727 Other spondylosis with radiculopathy, lumbosacral region: Secondary | ICD-10-CM

## 2019-03-15 MED ORDER — IOPAMIDOL (ISOVUE-M 300) INJECTION 61%
10.0000 mL | Freq: Once | INTRAMUSCULAR | Status: AC
  Administered 2019-03-15: 14:00:00 10 mL via INTRATHECAL

## 2019-03-15 MED ORDER — DIAZEPAM 5 MG PO TABS: 5.0000 mg | ORAL_TABLET | Freq: Once | ORAL | Status: DC

## 2019-03-15 NOTE — Discharge Instructions (Signed)

## 2019-04-06 ENCOUNTER — Other Ambulatory Visit: Payer: Self-pay | Admitting: Orthopaedic Surgery

## 2019-04-06 DIAGNOSIS — M5030 Other cervical disc degeneration, unspecified cervical region: Secondary | ICD-10-CM

## 2019-04-07 ENCOUNTER — Telehealth: Payer: Self-pay | Admitting: Cardiovascular Disease

## 2019-04-07 ENCOUNTER — Other Ambulatory Visit: Payer: Self-pay | Admitting: Cardiovascular Disease

## 2019-04-07 DIAGNOSIS — I2510 Atherosclerotic heart disease of native coronary artery without angina pectoris: Secondary | ICD-10-CM

## 2019-04-07 MED ORDER — EZETIMIBE 10 MG PO TABS: 10.0000 mg | ORAL_TABLET | Freq: Every day | ORAL | 0 refills | Status: AC

## 2019-04-07 NOTE — Telephone Encounter (Signed)
New Message:    Pt says he usually has an Echo before he have an appointment with Dr Angelena Form. He will see Estella Husk on 05-10-19.

## 2019-04-07 NOTE — Telephone Encounter (Signed)
Per both messages below--reviewed medication list.  Sent refill for zetia to his pharmacy and pt asking for repeat echo for part of DOT physical as he had last year.  His appointment with Gerrianne Scale is in January.

## 2019-04-07 NOTE — Telephone Encounter (Signed)
New Message:     Pt have lost 2 of his medicine. He does not know the name of them, he needs somebody to go over his medicine list with him please.

## 2019-04-07 NOTE — Telephone Encounter (Signed)
Algie Coffer M at 04/07/2019 1:53 PM  Status: Signed    New Message:    Pt says he usually has an Echo before he have an appointment with Dr Angelena Form. He will see Estella Husk on 05-10-19.

## 2019-04-08 NOTE — Telephone Encounter (Signed)
We an order his echo to be done prior to his visit with Jerilynn Mages. Bonnell Public. Thanks, chris

## 2019-04-08 NOTE — Telephone Encounter (Signed)
Order placed for echo for prior to next visit with Ermalinda Barrios, PA-C. Pt aware he would be contacted to schedule.

## 2019-04-23 ENCOUNTER — Other Ambulatory Visit: Payer: Self-pay

## 2019-04-23 ENCOUNTER — Ambulatory Visit
Admission: RE | Admit: 2019-04-23 | Discharge: 2019-04-23 | Disposition: A | Discharge status: Home / Self Care | Payer: Medicare Other | Source: Ambulatory Visit | Attending: Orthopaedic Surgery | Admitting: Orthopaedic Surgery

## 2019-04-23 DIAGNOSIS — M5030 Other cervical disc degeneration, unspecified cervical region: Secondary | ICD-10-CM

## 2019-05-04 DIAGNOSIS — I255 Ischemic cardiomyopathy: Secondary | ICD-10-CM | POA: Insufficient documentation

## 2019-05-04 DIAGNOSIS — I5042 Chronic combined systolic (congestive) and diastolic (congestive) heart failure: Secondary | ICD-10-CM | POA: Insufficient documentation

## 2019-05-04 NOTE — Progress Notes (Deleted)
Cardiology Office Note    Date:  05/04/2019   ID:  Mathew Padilla, DOB 1950/07/16, MRN [id number]  PCP:  Doree Albee, MD  Cardiologist: Lauree Chandler, MD EPS: None  No chief complaint on file.   History of Present Illness:  Mathew Padilla is a 69 y.o. male with history of CAD status post MI in 1999 treated with TPA and stenting of the RCA with balloon angioplasty of the PDA and second marginal in 2000.  Cardiac cath 02/2012 after abnormal stress test moderate stenosis of the mid LAD and mid circumflex with severe stenosis of a small OM branch too small for PCI and totally occluded mid RCA medical management.  High risk stress Myoview 03/2014 no clear ischemia reduced LVEF.  Follow-up echo EF of 45 to 50%.  Echo 07/2017 LVEF 30 to 35%.  Cardiac MRI 08/26/2017 moderately dilated LV with inferior lateral and inferior akinesis in the distribution of the occluded RCA this was suggestion of viable tissue in the inferior wall also mid anterior lateral hypokinesis LVEF estimated 30 to 35%.  Cardiac cath 08/2017 moderate mid LAD stenosis, occluded distal LAD, nonobstructive disease in the circumflex and chronic occlusion of the RCA medical management recommended.  Repeat echo 03/2018 LVEF 40 to 45%.  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  Ligation of left pop gluteal aneurysm with left above-knee the knee popliteal bypass 2013 followed by Dr. Oneida Alar.  Also has hypertension, OSA, obesity.    Past Medical History:  Diagnosis Date  . AAA (abdominal aortic aneurysm) ([place]) 2007   stent graft repair 12/07  . Arteriosclerotic cardiovascular disease (ASCVD)    a. MI in 1999;  b.  PTCA of PDA and 2nd marginal in 3/00;  c. 02/2012 Cath: LM nl,  LAD 49m, LCX 27m, OM small, 70, RCA 99p/156m-->Med Rx;  d. 03/2014 MV: EF 32%, inflat infarct w/ mild peri-infarct isch towards apex (similar to 2013 MV); d. 03/2014 Echo: EF 45-50%, Gr 1 DD, mildly dil LA.  Marland Kitchen CAD (coronary artery disease)   . Cerebrovascular disease   . CHF (congestive heart failure) ([place])   . Cholelithiasis 11/28/2010  . Cough, persistent   . Diverticulitis 2006  . DVT (deep venous thrombosis) ([place])   . Elevated PSA 06/2011   a. s/p TURP 01/2014.  Marland Kitchen Gastroesophageal reflux disease    Hiatal hernia; previously treated with PPI  . Hypertension    Mild; controlled with a single agent    . Laryngeal carcinoma ([place]) 2010   resection and radiation therapy 2010-11  . Myocardial infarction ([place])   . Obesity    BMI 47; lap band surgery 2010  . 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 Sleep apnea    BiPAP mask utilized        2 yrs sleep study    dr Redmond Pulling  . Tobacco abuse, in remission    40 pack years; discontinued in 2000  . Type 2 diabetes mellitus ([place])     Past Surgical History:  Procedure Laterality Date  . ANTERIOR LAT LUMBAR FUSION N/A 02/22/2016   Procedure: LUMBAR TWO-LUMBAR FIVE ANTERIOR LATERAL LUMBAR INTERBODY FUSION WITH PERCUTANEOUS PEDICLE SCREWS;  Surgeon: Kevan Ny Ditty, MD;  Location: [facility];  Service: Neurosurgery;  Laterality: N/A;  Left side approach  . CARDIAC CATHETERIZATION  2000   PCI-stent  . CATARACT EXTRACTION    . COLONOSCOPY  10/2007  . COLONOSCOPY WITH ESOPHAGOGASTRODUODENOSCOPY (EGD)  2009   RMR: He had distal esophageal erosions, patulous GE junction, erosive reflux esophagitis, hiatal hernia, lipoma on the angularis not manipulated, left-sided diverticula.  . CYSTOSCOPY  02/22/2016   Procedure: CYSTOSCOPY FLEXIBLE WITH URETHER DILATION  WITH CATHETER PLACEMENT;  Surgeon: Alexis Frock, MD;  Location: [facility];  Service: Urology;;  . EYE SURGERY    . FEMORAL-FEMORAL BYPASS GRAFT  04/18/2011   Procedure: BYPASS GRAFT  FEMORAL-FEMORAL ARTERY;  Surgeon: Theotis Burrow, MD;  Location: [facility];  Service: Vascular;  Laterality: Bilateral;  Left to right femoral to femoral bypass   . FEMORAL-POPLITEAL BYPASS GRAFT  04/18/2011   Procedure: BYPASS GRAFT FEMORAL-POPLITEAL ARTERY;  Surgeon: Theotis Burrow, MD;  Location: [facility];  Service: Vascular;  Laterality: Right;  Right femoral popliteal bypass with propaten gortex graft  . gastic sleeve  07/2015   gastic band removed and have sleeve  . LAPAROSCOPIC GASTRIC BANDING  2010  . LARYNX SURGERY  2010   Resection of carcinoma  . LEFT HEART CATH AND CORONARY ANGIOGRAPHY N/A 09/02/2017   Procedure: LEFT HEART CATH AND CORONARY ANGIOGRAPHY;  Surgeon: Burnell Blanks, MD;  Location: [facility];  Service: Cardiovascular;  Laterality: N/A;  . LEFT HEART CATHETERIZATION WITH CORONARY ANGIOGRAM N/A 03/10/2012   Procedure: LEFT HEART CATHETERIZATION WITH CORONARY ANGIOGRAM;  Surgeon: Burnell Blanks, MD;  Location: [facility];  Service: Cardiovascular;  Laterality: N/A;  . LUMBAR PERCUTANEOUS PEDICLE SCREW 3 LEVEL N/A 02/22/2016   Procedure: LUMBAR PERCUTANEOUS PEDICLE SCREW LUMBAR TWO-LUMBAR FIVE;  Surgeon: Kevan Ny Ditty, MD;  Location: [facility];  Service: Neurosurgery;  Laterality: N/A;  . OPEN ANTERIOR SHOULDER RECONSTRUCTION  2011   Left  . PERIPHERAL ARTERIAL STENT GRAFT  2007   for abdominal aortic aneurysm  . PR VEIN BYPASS GRAFT,AORTO-FEM-POP  07-23-11   Left AK to BK popliteal BPG using rev. saphenous vein    Current Medications: No outpatient medications have been marked as taking for the 05/10/19 encounter (Appointment) with Imogene Burn, PA-C.     Allergies:   Finasteride, Statins, Aspirin, and Beta adrenergic blockers   Social History   Socioeconomic History  . Marital status: Married    Spouse name: Not on file  . Number of children: 3  . Years of education: Not on file  . Highest education level: Not on file  Occupational  History  . Occupation: Retired, part Doctor, general practice  Tobacco Use  . Smoking status: Former Smoker    Packs/day: 2.00    Years: 30.00    Pack years: 60.00    Types: Cigarettes    Quit date: 11/26/1997    Years since quitting: 21.4  . Smokeless tobacco: Never Used  . Tobacco comment: 40 pack years; quit 2000  Substance and Sexual Activity  . Alcohol use: No    Alcohol/week: 0.0 standard drinks  . Drug use: No  . Sexual activity: Yes  Other Topics Concern  . Not on file  Social History Narrative   Married, past employed, does not get regular exercise.    Social Determinants of Health   Financial Resource Strain:   . Difficulty of Paying Living Expenses: Not on file  Food Insecurity:   . Worried About Charity fundraiser in the Last Year: Not  on file  . Ran Out of Food in the Last Year: Not on file  Transportation Needs:   . Lack of Transportation (Medical): Not on file  . Lack of Transportation (Non-Medical): Not on file  Physical Activity:   . Days of Exercise per Week: Not on file  . Minutes of Exercise per Session: Not on file  Stress:   . Feeling of Stress : Not on file  Social Connections:   . Frequency of Communication with Friends and Family: Not on file  . Frequency of Social Gatherings with Friends and Family: Not on file  . Attends Religious Services: Not on file  . Active Member of Clubs or Organizations: Not on file  . Attends Archivist Meetings: Not on file  . Marital Status: Not on file     Family History:  The patient's ***family history includes Cancer in his sister; Diabetes in his mother and sister; Heart attack in his father; Heart disease in his father; Heart failure in his father and mother; Hyperlipidemia in his father and mother; Hypertension in his father and mother; Lung cancer in his mother.   ROS:   Please see the history of present illness.    ROS All other systems reviewed and are negative.   PHYSICAL EXAM:   VS:  There were no  vitals taken for this visit.  Physical Exam  GEN: Well nourished, well developed, in no acute distress  HEENT: normal  Neck: no JVD, carotid bruits, or masses Cardiac:RRR; no murmurs, rubs, or gallops  Respiratory:  clear to auscultation bilaterally, normal work of breathing GI: soft, nontender, nondistended, + BS Ext: without cyanosis, clubbing, or edema, Good distal pulses bilaterally MS: no deformity or atrophy  Skin: warm and dry, no rash Neuro:  Alert and Oriented x 3, Strength and sensation are intact Psych: euthymic mood, full affect  Wt Readings from Last 3 Encounters:  01/28/19 290 lb (131.5 kg)  10/11/18 291 lb (132 kg)  05/29/18 (!) 311 lb (141.1 kg)      Studies/Labs Reviewed:   EKG:  EKG is*** ordered today.  The ekg ordered today demonstrates ***  Recent Labs: 05/30/2018: Magnesium 1.8 01/28/2019: ALT 23; BUN 12; Creatinine, Ser 0.78; Hemoglobin 13.9; Platelets 134; Potassium 3.9; Sodium 131   Lipid Panel    Component Value Date/Time   CHOL 157 11/17/2017 0954   TRIG 135 11/17/2017 0954   HDL 53 11/17/2017 0954   CHOLHDL 3.0 11/17/2017 0954   CHOLHDL 4 11/01/2012 1120   VLDL 48.8 (H) 11/01/2012 1120   LDLCALC 77 11/17/2017 0954   LDLDIRECT 84.7 11/01/2012 1120    Additional studies/ records that were reviewed today include:    Echo 04/20/18:   - Left ventricle: Diffuse hypokinesis worse in the septum and apex.   The cavity size was mildly dilated. Wall thickness was normal.   Systolic function was mildly to moderately reduced. The estimated   ejection fraction was in the range of 40% to 45%. Left   ventricular diastolic function parameters were normal. Internal   dimension, ED (PLAX chordal): 55 mm. - Aortic valve: There was trivial regurgitation. - Left atrium: The atrium was mildly dilated. - Atrial septum: No defect or patent foramen ovale was identified.       ASSESSMENT:    No diagnosis found.   PLAN:  In order of problems listed  above:  CAD as above last cath 08/2017 with moderate mid LAD stenosis chronic total distal LAD and chronic total  RCA with mild disease in the circumflex.  On aspirin beta-blocker and ARB.  Intolerant to statins  Ischemic cardiomyopathy most recent echo 03/2018 LVEF 45% on Coreg and Avapro, did not tolerate Aldactone Dr. Angelena Form said to consider Delene Loll in the future if she is symptomatic  Chronic combined systolic and diastolic CHF  Essential hypertension  Hyperlipidemia intolerant to statins on Zetia    Medication Adjustments/Labs and Tests Ordered: Current medicines are reviewed at length with the patient today.  Concerns regarding medicines are outlined above.  Medication changes, Labs and Tests ordered today are listed in the Patient Instructions below. There are no Patient Instructions on file for this visit.   Sumner Boast, PA-C  05/04/2019 3:23 PM    [facility] [address], [place], [place]  [address] Phone: [phone]; Fax: [phone]

## 2019-05-05 ENCOUNTER — Other Ambulatory Visit (HOSPITAL_COMMUNITY): Payer: Medicare Other

## 2019-05-09 ENCOUNTER — Other Ambulatory Visit: Payer: Self-pay

## 2019-05-09 ENCOUNTER — Ambulatory Visit (HOSPITAL_COMMUNITY): Payer: Medicare Other | Attending: Cardiovascular Disease

## 2019-05-09 DIAGNOSIS — I2510 Atherosclerotic heart disease of native coronary artery without angina pectoris: Secondary | ICD-10-CM | POA: Insufficient documentation

## 2019-05-09 MED ORDER — PERFLUTREN LIPID MICROSPHERE
1.0000 mL | INTRAVENOUS | Status: AC | PRN
  Administered 2019-05-09: 09:00:00 3 mL via INTRAVENOUS

## 2019-05-10 ENCOUNTER — Ambulatory Visit: Payer: Medicare Other | Admitting: Physician Assistant

## 2019-05-12 ENCOUNTER — Telehealth: Payer: Self-pay

## 2019-05-12 NOTE — Telephone Encounter (Signed)
-----   Message from Burnell Blanks, MD sent at 05/09/2019 11:44 AM EST ----- No change in echo. He has an appt with M. Bonnell Public in 10 days. cdm

## 2019-05-12 NOTE — Telephone Encounter (Signed)
LMTCB regarding echo results

## 2019-05-27 ENCOUNTER — Telehealth: Payer: Self-pay | Admitting: Cardiovascular Disease

## 2019-05-27 NOTE — Telephone Encounter (Signed)
New Message  Pt's wife called and informed me that the patient passed away on 2019/06/03. Pt's wife would like to get results of deceased ECHO results.  Please call back to discuss

## 2019-05-27 NOTE — Telephone Encounter (Signed)
Spoke with the patient's husband and notified her of echocardiogram results. Patient's daughter got on the phone and informed me that her sister had passed away three weeks prior to her father. She asked for a copy of echo results which I will send.

## 2019-05-27 NOTE — Telephone Encounter (Signed)
I will send to HIM Dept due to pt is deceased, though chart does not reflect this.

## 2019-05-30 DEATH — deceased

## 2019-06-07 IMAGING — MR MR SHOULDER*R* W/O CM
5 series · 32 of 40 positions shown · non-contrast
Comparison: None.

CLINICAL DATA: Right shoulder pain, decreased range of motion

EXAM:
MRI OF THE RIGHT SHOULDER WITHOUT CONTRAST
TECHNIQUE: Multiplanar, multisequence MR imaging of the shoulder was performed.
No intravenous contrast was administered.

[Series 3: T2 fat-sat · axial · 4.0mm · 0.55mm/px · z∈[-38,+70]mm · 8 of 24 slices shown (1 of 3)]
[im 1/24]
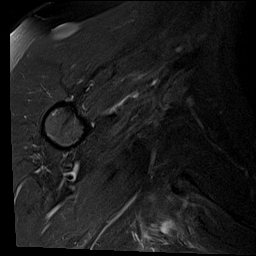
[im 4/24]
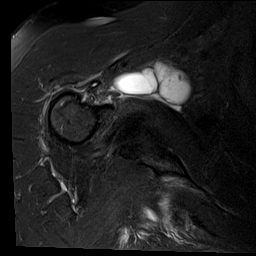
[im 7/24]
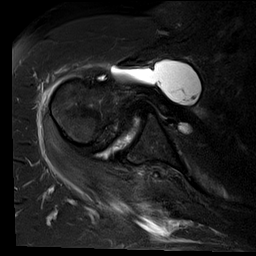
[im 10/24]
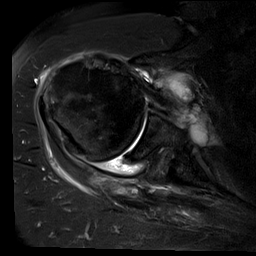
[im 14/24]
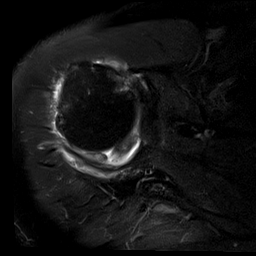
[im 17/24]
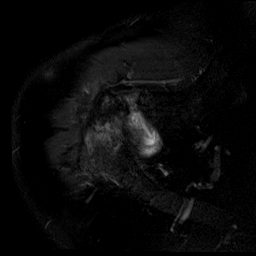
[im 20/24]
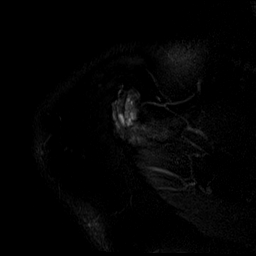
[im 24/24]
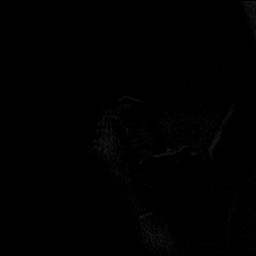

[Series 4: T1 · oblique · 4.0mm · 0.29mm/px · 1 of 27 slices shown]
[im 1/27]
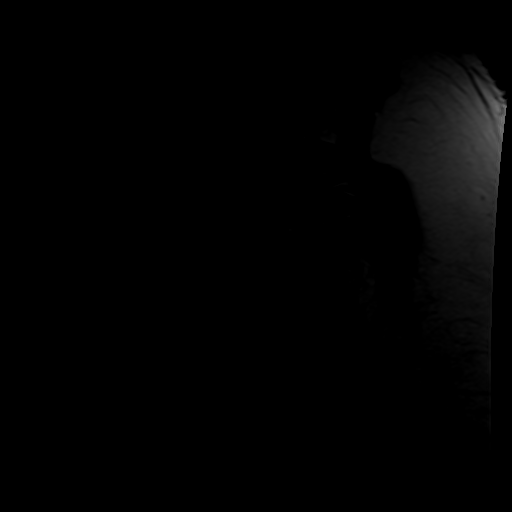

[Series 5: T2 fat-sat · oblique · 4.0mm · 0.59mm/px · 9 of 27 slices shown (2 of 3)]
[im 1/27]
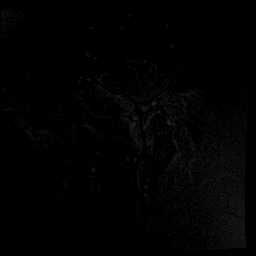
[im 4/27]
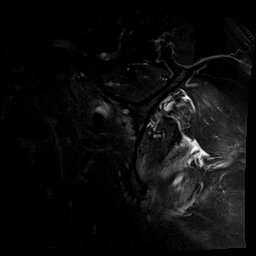
[im 7/27]
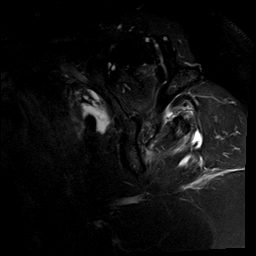
[im 10/27]
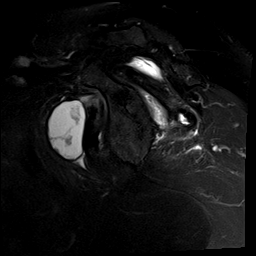
[im 14/27]
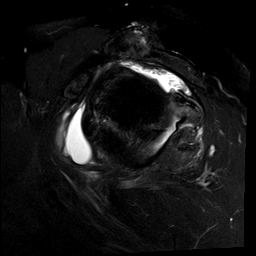
[im 17/27]
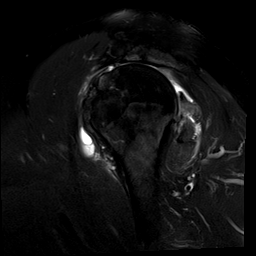
[im 20/27]
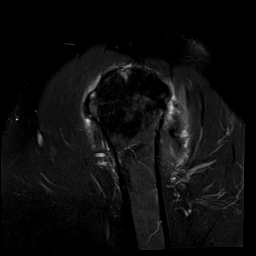
[im 23/27]
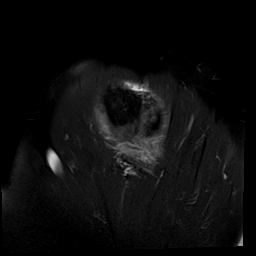
[im 27/27]
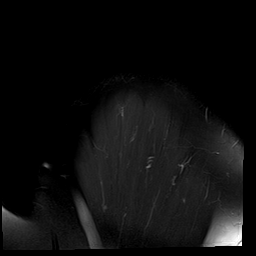

[Series 6: T2 fat-sat · oblique · 4.0mm · 0.59mm/px · 7 of 22 slices shown (3 of 3)]
[im 1/22]
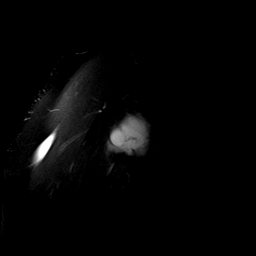
[im 4/22]
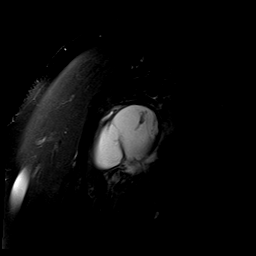
[im 8/22]
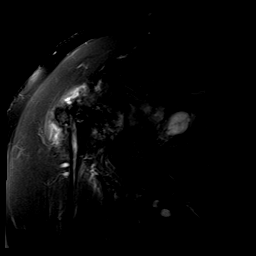
[im 11/22]
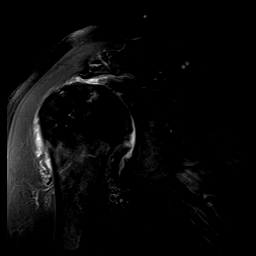
[im 15/22]
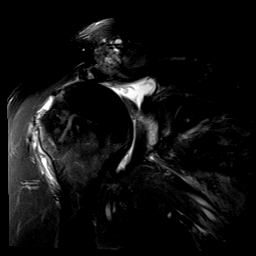
[im 18/22]
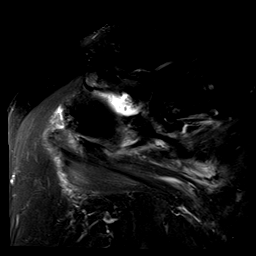
[im 22/22]
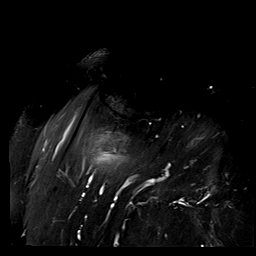

[Series 7: PD · oblique · 4.0mm · 0.23mm/px · 7 of 22 slices shown]
[im 1/22]
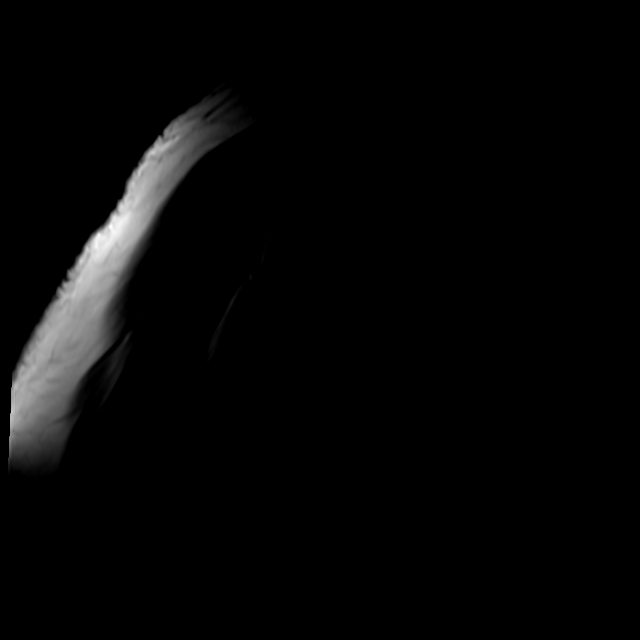
[im 4/22]
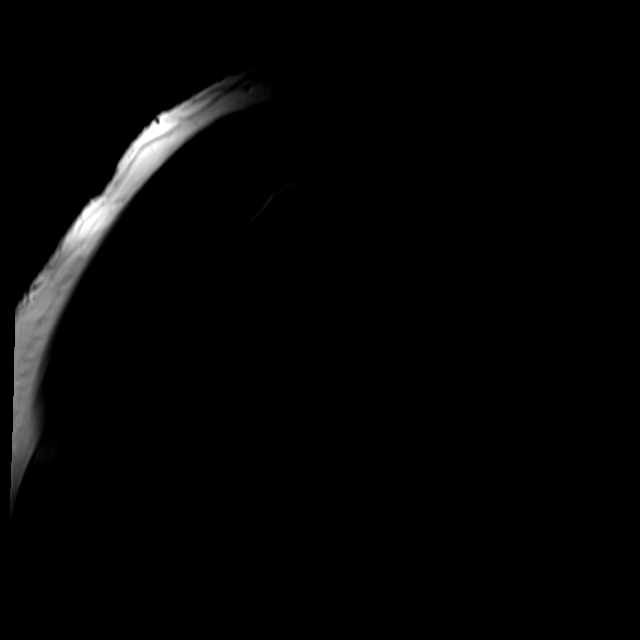
[im 8/22]
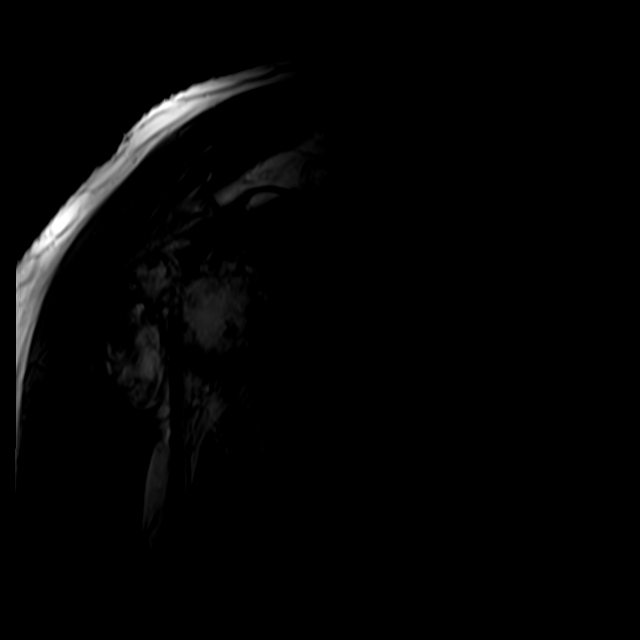
[im 11/22]
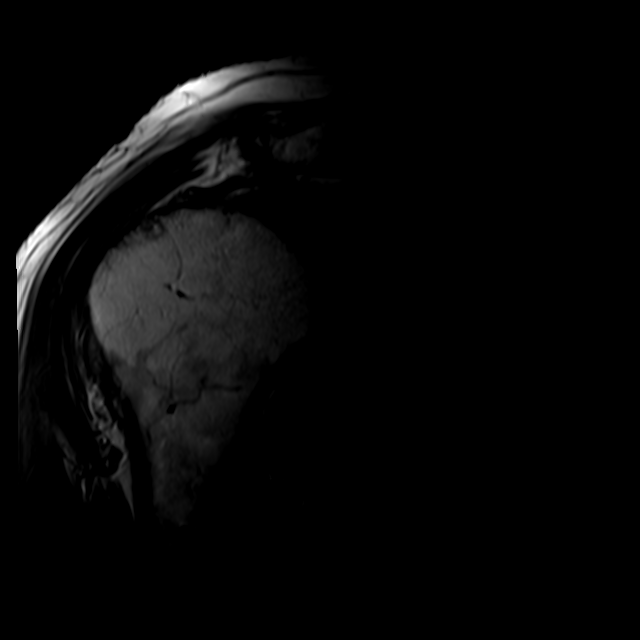
[im 15/22]
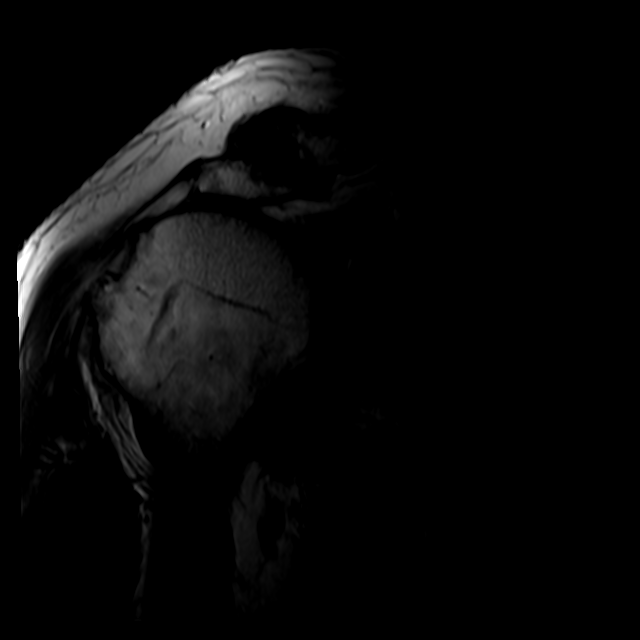
[im 18/22]
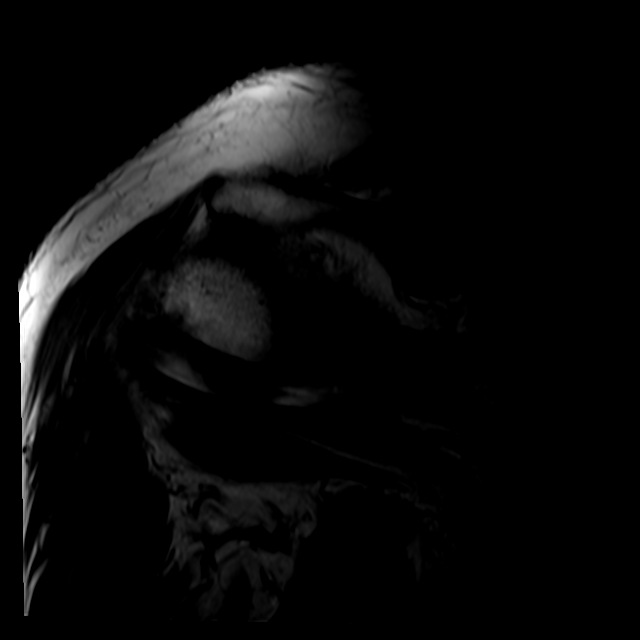
[im 22/22]
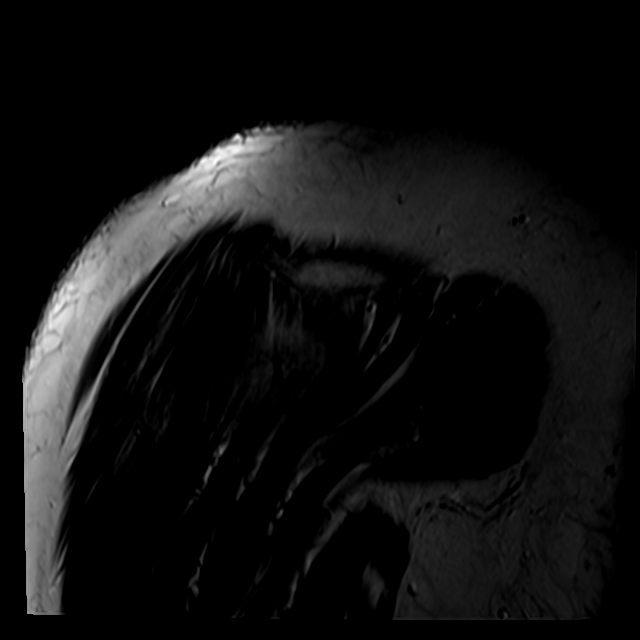

[32 of 40 positions shown; findings below may reference images not displayed]

FINDINGS: Rotator cuff: Complete tear of the supraspinatus and infraspinatus
tendons with 4 cm of retraction. Teres minor tendon is intact.
Moderate tendinosis of the subscapularis tendon.

Muscles: Mild muscle atrophy of the infraspinatus muscle. Remainder
the rotator cuff muscles demonstrate no focal abnormality.

Biceps long head: Tendinosis of the intra-articular portion of the
long head of the biceps tendon with a partial-thickness tear.

Acromioclavicular Joint: Severe arthropathy of the acromioclavicular
joint. Type II acromion. Small amount of subacromial/subdeltoid
bursal fluid.

Glenohumeral Joint: Small joint effusion. Moderate amount of fluid
in the subcoracoid recess with multiple small low signal foci likely
reflecting synovitis. High-grade partial-thickness cartilage loss of
the glenohumeral joint with small inferior marginal osteophyte.

Labrum:  Superior labral degeneration.

Bones: No acute osseous abnormality. Subcortical reactive marrow
changes of the humeral head. No aggressive osseous lesion.

Other: No fluid collection or hematoma.
IMPRESSION: 1. Complete tear of the supraspinatus and infraspinatus tendons with
4 cm of retraction.
2. Moderate tendinosis of the subscapularis tendon. Mild muscle
atrophy of the infraspinatus muscle.
3. Tendinosis of the intra-articular portion of the long head of the
biceps tendon with a partial-thickness tear.
4. Moderate osteoarthritis of glenohumeral joint.
